# Patient Record
Sex: Male | Born: 1950 | Race: White | Hispanic: No | Marital: Single | Smoking: Former smoker
Health system: Southern US, Community
[De-identification: ages and names within clinical notes are randomized; demographics above are authoritative.]

## PROBLEM LIST (undated history)

## (undated) DIAGNOSIS — K219 Gastro-esophageal reflux disease without esophagitis: Secondary | ICD-10-CM

## (undated) DIAGNOSIS — I2699 Other pulmonary embolism without acute cor pulmonale: Secondary | ICD-10-CM

## (undated) DIAGNOSIS — I82409 Acute embolism and thrombosis of unspecified deep veins of unspecified lower extremity: Secondary | ICD-10-CM

## (undated) DIAGNOSIS — K432 Incisional hernia without obstruction or gangrene: Secondary | ICD-10-CM

## (undated) HISTORY — DX: Other pulmonary embolism without acute cor pulmonale: I26.99

## (undated) HISTORY — PX: HERNIA REPAIR: SHX51

## (undated) HISTORY — DX: Acute embolism and thrombosis of unspecified deep veins of unspecified lower extremity: I82.409

## (undated) HISTORY — PX: APPENDECTOMY: SHX54

## (undated) HISTORY — PX: TONSILLECTOMY: SUR1361

## (undated) HISTORY — DX: Gastro-esophageal reflux disease without esophagitis: K21.9

## (undated) HISTORY — DX: Incisional hernia without obstruction or gangrene: K43.2

---

## 2008-02-03 DIAGNOSIS — I2699 Other pulmonary embolism without acute cor pulmonale: Secondary | ICD-10-CM

## 2008-02-03 HISTORY — DX: Other pulmonary embolism without acute cor pulmonale: I26.99

## 2008-02-03 HISTORY — PX: FEMORAL ARTERY STENT: SHX1583

## 2008-02-03 HISTORY — PX: HERNIA REPAIR: SHX51

## 2008-02-03 HISTORY — PX: OTHER SURGICAL HISTORY: SHX169

## 2008-09-30 ENCOUNTER — Inpatient Hospital Stay: Payer: Self-pay | Admitting: Surgery

## 2008-11-02 ENCOUNTER — Inpatient Hospital Stay (HOSPITAL_COMMUNITY): Admission: EM | Admit: 2008-11-02 | Discharge: 2008-11-10 | Payer: Self-pay | Admitting: Emergency Medicine

## 2009-01-13 ENCOUNTER — Inpatient Hospital Stay (HOSPITAL_COMMUNITY): Admission: EM | Admit: 2009-01-13 | Discharge: 2009-01-15 | Payer: Self-pay | Admitting: Emergency Medicine

## 2010-05-06 LAB — CBC
RBC: 3.59 MIL/uL — ABNORMAL LOW (ref 4.22–5.81)
WBC: 7.4 10*3/uL (ref 4.0–10.5)

## 2010-05-06 LAB — COMPREHENSIVE METABOLIC PANEL
ALT: 18 U/L (ref 0–53)
Albumin: 3.3 g/dL — ABNORMAL LOW (ref 3.5–5.2)
Alkaline Phosphatase: 63 U/L (ref 39–117)
BUN: 15 mg/dL (ref 6–23)
CO2: 26 mEq/L (ref 19–32)
Calcium: 8.9 mg/dL (ref 8.4–10.5)
Creatinine, Ser: 0.83 mg/dL (ref 0.4–1.5)
Glucose, Bld: 131 mg/dL — ABNORMAL HIGH (ref 70–99)
Sodium: 137 mEq/L (ref 135–145)
Total Bilirubin: 0.6 mg/dL (ref 0.3–1.2)
Total Protein: 7.2 g/dL (ref 6.0–8.3)

## 2010-05-06 LAB — DIFFERENTIAL
Basophils Absolute: 0 10*3/uL (ref 0.0–0.1)
Basophils Relative: 0 % (ref 0–1)
Lymphocytes Relative: 24 % (ref 12–46)
Monocytes Relative: 12 % (ref 3–12)
Neutro Abs: 4.6 10*3/uL (ref 1.7–7.7)

## 2010-05-06 LAB — CULTURE, BLOOD (ROUTINE X 2): Culture: NO GROWTH

## 2010-05-06 LAB — PROTIME-INR
INR: 1.5 — ABNORMAL HIGH (ref 0.00–1.49)
INR: 1.96 — ABNORMAL HIGH (ref 0.00–1.49)
Prothrombin Time: 18 seconds — ABNORMAL HIGH (ref 11.6–15.2)
Prothrombin Time: 24.2 seconds — ABNORMAL HIGH (ref 11.6–15.2)

## 2010-05-06 LAB — APTT: aPTT: 68 seconds — ABNORMAL HIGH (ref 24–37)

## 2010-05-08 LAB — COMPREHENSIVE METABOLIC PANEL
Albumin: 2.5 g/dL — ABNORMAL LOW (ref 3.5–5.2)
Alkaline Phosphatase: 107 U/L (ref 39–117)
BUN: 19 mg/dL (ref 6–23)
CO2: 25 mEq/L (ref 19–32)
Chloride: 99 mEq/L (ref 96–112)
Creatinine, Ser: 0.81 mg/dL (ref 0.4–1.5)
GFR calc Af Amer: >60 mL/min (ref >60)
GFR calc non Af Amer: >60 mL/min (ref >60)
Glucose, Bld: 110 mg/dL — ABNORMAL HIGH (ref 70–99)
Potassium: 4.6 mEq/L (ref 3.5–5.1)
Total Protein: 7.6 g/dL (ref 6.0–8.3)

## 2010-05-08 LAB — BASIC METABOLIC PANEL
BUN: 13 mg/dL (ref 6–23)
Chloride: 101 mEq/L (ref 96–112)
Chloride: 104 mEq/L (ref 96–112)
Creatinine, Ser: 0.87 mg/dL (ref 0.4–1.5)
GFR calc Af Amer: >60 mL/min (ref >60)
GFR calc Af Amer: >60 mL/min (ref >60)
GFR calc Af Amer: >60 mL/min (ref >60)
GFR calc non Af Amer: >60 mL/min (ref >60)
GFR calc non Af Amer: >60 mL/min (ref >60)
GFR calc non Af Amer: >60 mL/min (ref >60)
Potassium: 3.6 mEq/L (ref 3.5–5.1)
Potassium: 3.9 mEq/L (ref 3.5–5.1)
Potassium: 3.9 mEq/L (ref 3.5–5.1)
Sodium: 136 mEq/L (ref 135–145)
Sodium: 139 mEq/L (ref 135–145)

## 2010-05-08 LAB — DIFFERENTIAL
Basophils Absolute: 0 10*3/uL (ref 0.0–0.1)
Basophils Relative: 0 % (ref 0–1)
Monocytes Absolute: 1.2 10*3/uL — ABNORMAL HIGH (ref 0.1–1.0)
Monocytes Relative: 10 % (ref 3–12)
Neutrophils Relative %: 82 % — ABNORMAL HIGH (ref 43–77)

## 2010-05-08 LAB — TYPE AND SCREEN
ABO/RH(D): B NEG
Antibody Screen: NEGATIVE

## 2010-05-08 LAB — CBC
HCT: 24.2 % — ABNORMAL LOW (ref 39.0–52.0)
HCT: 26 % — ABNORMAL LOW (ref 39.0–52.0)
HCT: 30.7 % — ABNORMAL LOW (ref 39.0–52.0)
Hemoglobin: 10.2 g/dL — ABNORMAL LOW (ref 13.0–17.0)
Hemoglobin: 8.8 g/dL — ABNORMAL LOW (ref 13.0–17.0)
MCHC: 33.1 g/dL (ref 30.0–36.0)
MCHC: 33.9 g/dL (ref 30.0–36.0)
MCV: 84.6 fL (ref 78.0–100.0)
MCV: 84.7 fL (ref 78.0–100.0)
MCV: 85.1 fL (ref 78.0–100.0)
MCV: 85.7 fL (ref 78.0–100.0)
Platelets: 335 10*3/uL (ref 150–400)
Platelets: 362 10*3/uL (ref 150–400)
Platelets: 423 10*3/uL — ABNORMAL HIGH (ref 150–400)
RBC: 2.86 MIL/uL — ABNORMAL LOW (ref 4.22–5.81)
RBC: 2.92 MIL/uL — ABNORMAL LOW (ref 4.22–5.81)
RBC: 2.96 MIL/uL — ABNORMAL LOW (ref 4.22–5.81)
RBC: 3.19 MIL/uL — ABNORMAL LOW (ref 4.22–5.81)
RBC: 3.58 MIL/uL — ABNORMAL LOW (ref 4.22–5.81)
RDW: 17.1 % — ABNORMAL HIGH (ref 11.5–15.5)
WBC: 10.3 10*3/uL (ref 4.0–10.5)
WBC: 8.1 10*3/uL (ref 4.0–10.5)
WBC: 8.3 10*3/uL (ref 4.0–10.5)
WBC: 8.3 10*3/uL (ref 4.0–10.5)
WBC: 9.3 10*3/uL (ref 4.0–10.5)
WBC: 9.4 10*3/uL (ref 4.0–10.5)

## 2010-05-08 LAB — HEPARIN LEVEL (UNFRACTIONATED)
Heparin Unfractionated: 0.37 IU/mL (ref 0.30–0.70)
Heparin Unfractionated: 0.71 IU/mL — ABNORMAL HIGH (ref 0.30–0.70)
Heparin Unfractionated: <0.1 IU/mL — ABNORMAL LOW (ref 0.30–0.70)
Heparin Unfractionated: <0.1 IU/mL — ABNORMAL LOW (ref 0.30–0.70)
Heparin Unfractionated: <0.1 IU/mL — ABNORMAL LOW (ref 0.30–0.70)

## 2010-05-08 LAB — PROTIME-INR
INR: 1.24 (ref 0.00–1.49)
INR: 1.4 (ref 0.00–1.49)
INR: 1.4 (ref 0.00–1.49)
INR: 2.6 — ABNORMAL HIGH (ref 0.00–1.49)
Prothrombin Time: 14.8 seconds (ref 11.6–15.2)
Prothrombin Time: 15.5 seconds — ABNORMAL HIGH (ref 11.6–15.2)
Prothrombin Time: 17 seconds — ABNORMAL HIGH (ref 11.6–15.2)
Prothrombin Time: 17.2 seconds — ABNORMAL HIGH (ref 11.6–15.2)

## 2010-05-08 LAB — CULTURE, BLOOD (ROUTINE X 2)
Culture: NO GROWTH
Culture: NO GROWTH

## 2010-05-08 LAB — PREPARE FRESH FROZEN PLASMA

## 2010-05-08 LAB — URINALYSIS, ROUTINE W REFLEX MICROSCOPIC
Bilirubin Urine: NEGATIVE
Glucose, UA: NEGATIVE mg/dL
Ketones, ur: NEGATIVE mg/dL
pH: 6 (ref 5.0–8.0)

## 2010-05-08 LAB — CULTURE, ROUTINE-ABSCESS

## 2010-05-08 LAB — ABO/RH: ABO/RH(D): B NEG

## 2010-08-20 ENCOUNTER — Ambulatory Visit (INDEPENDENT_AMBULATORY_CARE_PROVIDER_SITE_OTHER): Payer: Commercial Indemnity | Admitting: General Surgery

## 2010-08-20 ENCOUNTER — Encounter (INDEPENDENT_AMBULATORY_CARE_PROVIDER_SITE_OTHER): Payer: Self-pay | Admitting: General Surgery

## 2010-08-20 VITALS — BP 116/70 | HR 60 | Temp 97.9°F | Ht 70.0 in | Wt 172.4 lb

## 2010-08-20 DIAGNOSIS — K432 Incisional hernia without obstruction or gangrene: Secondary | ICD-10-CM

## 2010-08-20 DIAGNOSIS — Z8719 Personal history of other diseases of the digestive system: Secondary | ICD-10-CM | POA: Insufficient documentation

## 2010-08-20 DIAGNOSIS — Z9889 Other specified postprocedural states: Secondary | ICD-10-CM | POA: Insufficient documentation

## 2010-08-20 HISTORY — DX: Incisional hernia without obstruction or gangrene: K43.2

## 2010-08-20 NOTE — Progress Notes (Signed)
Subjective:     Patient ID: Jordan Staff., male   DOB: [birth year], 60 y.o.   MRN: [id number]  HPI Patient is well-known to our service status post admission to the inpatient surgical service for intra-abdominal abscess. Patient had undergone inguinal hernia repair at [facility] Followup lites look laparotomy for early postoperative bowel obstruction. He developed an intra-abdominal abscess and came to [facility], and our service for further care. He has done well and is feeling good. He has developed a incisional hernia. He has no change in bowel habits. He has no pain. The hernia seems to reduce spontaneously when he lays down. The bulge has been gradually getting bigger. 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  Review of Systems     Objective:   Physical Exam General patient is awake and alert and alert. Lungs: Clear to auscultation. No wheezing. Cardiovascular: Heart regular rate and rhythm and pulses palpable laterally in the left chest. Abdomen is soft there is a large incisional hernia from his previous midline incision. It reduces very easily with no pain. However the hernia defect is approximately 15 x 8 cm.     Assessment:     Incisional hernia    Plan:        This is a large incisional hernia. I don't feel it is amenable to laparoscopic repair. I feel he will need an open repair with mesh and likely some component separation. He will likely have some adhesions due to his previous abdominal abscess. We discussed surgery in detail. We also discussed risks and benefits as above. He would like to discuss things over with his family. He has agreed to back and see me in 6 weeks. Otherwise if he wants to go ahead with surgery he will call us in the interim. He wanted to hold his Coumadin for 5 days prior to surgery.

## 2010-08-28 ENCOUNTER — Telehealth (INDEPENDENT_AMBULATORY_CARE_PROVIDER_SITE_OTHER): Payer: Self-pay | Admitting: General Surgery

## 2010-08-28 NOTE — Telephone Encounter (Signed)
I will put the chart/orders on Dr Carollee Massed desk for completion

## 2010-09-24 ENCOUNTER — Ambulatory Visit (INDEPENDENT_AMBULATORY_CARE_PROVIDER_SITE_OTHER): Payer: Commercial Indemnity | Admitting: General Surgery

## 2010-09-24 ENCOUNTER — Encounter (INDEPENDENT_AMBULATORY_CARE_PROVIDER_SITE_OTHER): Payer: Self-pay | Admitting: General Surgery

## 2010-09-24 VITALS — BP 118/82 | HR 60 | Wt 172.2 lb

## 2010-09-24 DIAGNOSIS — K432 Incisional hernia without obstruction or gangrene: Secondary | ICD-10-CM

## 2010-09-24 NOTE — Progress Notes (Signed)
Subjective:     Patient ID: Jordan Staff., male   DOB: December 30, 1950, 60 y.o.   MRN: [id number]  HPI  Patient is for followup of incisional hernia. Patient had an abdominal abscess after exploratory laparotomy at [facility]. That was following a lifting hernia repair. He does lifting at work and is having some pain associated with this hernia. He is scheduled for open repair with component separation on September 20. He would like to see if we can move his surgery sooner. She is eating and moving his bowels regularly. The pain he has seems muscular and usually on the left side. Review of Systems     Objective:   Physical Exam  HENT:  Head: Normocephalic and atraumatic.  Cardiovascular: Normal rate and regular rhythm.   Pulmonary/Chest: Effort normal and breath sounds normal. He has no wheezes.  Abdominal: Soft. Bowel sounds are normal. There is no tenderness. There is no guarding.  Patient has a large incisional hernia. This easily reduces. The defect is stable in size from previous visit. There is no pain on examination or reduction.     Assessment:     Incisional hernia    Plan:     Offered open repair with likely component separation and mesh. Procedure risks and benefits were again discussed with the patient. These include but are not limited to bleeding infection bowel injury other organ injury pulmonary and cardiac complications. This will require a hospitalization of several days. He'll need to hold his Coumadin starting 5 days prior to surgery. Patient is agreeable. We will see if we can with the patient surgery sooner.

## 2010-10-15 ENCOUNTER — Encounter (HOSPITAL_COMMUNITY)
Admission: RE | Admit: 2010-10-15 | Discharge: 2010-10-15 | Disposition: A | Discharge status: Home / Self Care | Payer: Managed Care, Other (non HMO) | Source: Ambulatory Visit | Attending: General Surgery | Admitting: General Surgery

## 2010-10-15 LAB — CBC
HCT: 40.6 % (ref 39.0–52.0)
MCH: 30.5 pg (ref 26.0–34.0)
MCHC: 34 g/dL (ref 30.0–36.0)
MCV: 89.6 fL (ref 78.0–100.0)
RDW: 13.4 % (ref 11.5–15.5)

## 2010-10-15 LAB — SURGICAL PCR SCREEN: Staphylococcus aureus: POSITIVE — AB

## 2010-10-23 ENCOUNTER — Inpatient Hospital Stay (HOSPITAL_COMMUNITY)
Admission: RE | Admit: 2010-10-23 | Discharge: 2010-10-28 | DRG: 355 | Disposition: A | Discharge status: Home / Self Care | Payer: Managed Care, Other (non HMO) | Source: Ambulatory Visit | Attending: General Surgery | Admitting: General Surgery

## 2010-10-23 DIAGNOSIS — K432 Incisional hernia without obstruction or gangrene: Principal | ICD-10-CM | POA: Diagnosis present

## 2010-10-23 DIAGNOSIS — Z01812 Encounter for preprocedural laboratory examination: Secondary | ICD-10-CM

## 2010-10-23 DIAGNOSIS — R339 Retention of urine, unspecified: Secondary | ICD-10-CM | POA: Diagnosis not present

## 2010-10-23 DIAGNOSIS — Z86711 Personal history of pulmonary embolism: Secondary | ICD-10-CM

## 2010-10-23 DIAGNOSIS — Z23 Encounter for immunization: Secondary | ICD-10-CM

## 2010-10-23 HISTORY — PX: HERNIA REPAIR: SHX51

## 2010-10-24 LAB — BASIC METABOLIC PANEL
BUN: 16 mg/dL (ref 6–23)
CO2: 28 mEq/L (ref 19–32)
Chloride: 100 mEq/L (ref 96–112)
Creatinine, Ser: 0.98 mg/dL (ref 0.50–1.35)
Potassium: 4.2 mEq/L (ref 3.5–5.1)

## 2010-10-24 LAB — CBC
HCT: 35.4 % — ABNORMAL LOW (ref 39.0–52.0)
MCV: 90.5 fL (ref 78.0–100.0)
RBC: 3.91 MIL/uL — ABNORMAL LOW (ref 4.22–5.81)
WBC: 12 10*3/uL — ABNORMAL HIGH (ref 4.0–10.5)

## 2010-10-30 NOTE — Op Note (Signed)
NAMECYRUS, Jordan Long NO.:  [id number]  MEDICAL RECORD NO.:  [id number]  LOCATION:  2899                         FACILITY:  [facility]  PHYSICIAN:  Gabrielle Dare. Janee Morn, M.D.DATE OF BIRTH:  05-05-1950  DATE OF PROCEDURE:  10/23/2010 DATE OF DISCHARGE:                              OPERATIVE REPORT   PREOPERATIVE DIAGNOSIS:  Incisional hernia.  POSTOPERATIVE DIAGNOSIS:  Incisional hernia.  PROCEDURE:  Repair of incisional hernia with component separation and mesh, 25 x 15-cm Physiomesh.  SURGEON:  Gabrielle Dare. Janee Morn, MD  ANESTHESIA:  General endotracheal.  HISTORY OF PRESENT ILLNESS:  Jordan Long is a 60 year old gentleman who initially had left inguinal hernia repair done at [facility], this was followed by an exploratory laparotomy for bowel obstruction.  He developed intra-abdominal abscesses and after discharge from [facility], was admitted to [facility].  We care for him on our Acute Care Surgery Service for these abscesses.  He improved from all that, but has developed an incisional hernia, who presents today for elective repair of that hernia.  PROCEDURE IN DETAIL:  Informed consent was obtained.  The patient was identified in the preop holding area.  He received intravenous antibiotics.  He was brought to the operating room.  General endotracheal anesthesia was administered by the Anesthesia staff.  A time-out procedure was performed.  A Foley catheter was placed by nursing staff.  Abdomen was prepped and draped in sterile fashion.  A midline incision was made in an elliptical fashion to excise his old scar.  This was excised completely.  The hernia sac was entered and the hernia sac was opened at all along the fascia.  The hernia sac was freed up off the bowel and the fascia excised and discarded.  Further adhesiolysis was done to clear the small bowel circumferentially away from the fascia.  The superior part of the incision was noted to have  a couple more holes and the fascia extending up superiorly, so the skin incision was extended and the fascia was opened until we were beyond the porous portion of it back to good fascia.  Next, it was determined that the fascia would not come together easily, so as expected, we proceed with component separation, flaps were raised on each side just over the fascia and once we got out laterally, anterior fascia was incised in order to facilitate immobilization, this was done on both sides. Hemostasis was obtained in the subcutaneous flap with Bovie cautery. Once we freed up the fascia on both sides, then almost came together in the midline, the defect was measured out and chose a 15 x 20-cm Physiomesh in order to give some centimeters of underlay.  The bowel was rechecked and it was fully freed from the fascia for our repair and there were no complications from the adhesiolysis.  Next, the Physiomesh was tacked in with interrupted #1 Novafil sutures circumferentially. This nearly brought the fascia together in the midline.  Once all these were tied down securely and was good hemostasis, additional figure-of- eight #1 Novafil was used to close the fascia over the top of the mesh. The skin together very nicely.  Area was copiously irrigated. Meticulous hemostasis was ensured.  Two 19-French Harrison Mons  drains were placed, one on each side for the subcutaneous flap.  The umbilicus was tacked, but backed down to the midline fascia with interrupted 2-0 Vicryl sutures and the skin was closed with staples.  Sponge, needle and instrument counts were all correct.  The patient tolerated the procedure well without apparent complication, was taken to the recovery room in stable condition.     Gabrielle Dare Janee Morn, M.D.     BET/MEDQ  D:  10/23/2010  T:  10/23/2010  Job:  [id number]  Electronically Signed by Violeta Gelinas M.D. on 10/30/2010 01:07:01 PM

## 2010-10-30 NOTE — Discharge Summary (Signed)
  NAMEMYCAL, CONDE NO.:  [id number]  MEDICAL RECORD NO.:  [id number]  LOCATION:  5158                         FACILITY:  [facility]  PHYSICIAN:  Gabrielle Dare. Janee Morn, M.D.DATE OF BIRTH:  11-05-1950  DATE OF ADMISSION:  10/23/2010 DATE OF DISCHARGE:  10/28/2010                              DISCHARGE SUMMARY   DISCHARGE DIAGNOSIS:  Status post incisional hernia repair with mesh and components separation.  HISTORY OF PRESENT ILLNESS:  Mr. Sevey is a 60 year old gentleman, who presented for elective incisional hernia repair.  He initially had an exploratory laparotomy after inguinal hernia surgery at an outside hospital for bowel obstruction.  He developed a hernia along his midline and presents for elective repair.  HOSPITAL COURSE:  The patient underwent an uncomplicated open incisional hernia repair with mesh and components separation.  Postoperatively, he had good pain control initially with PCA and transitioned to oral pain medication and muscle relaxers.  He remained afebrile and hemodynamically stable.  He had 2 drains in the subcutaneous plane and the output for these tapered down pretty quickly.  These were removed the day prior to discharge.  He mobilized very well walking around and had excellent pain control, tolerated advancement of his diet and is discharged home in stable condition on postoperative day 5.  DISCHARGE DIET:  Regular.  DISCHARGE ACTIVITY:  No lifting.  DISCHARGE MEDICATIONS: 1. Oxycodone/APAP 5/325 one to two every 4 hours as needed for pain. 2. He is also to take Zanaflex 4 mg p.o. q.8 h. p.r.n. muscle spasm.  He is to continue all of his home medications include: 1. Ferrous sulfate 325 mg daily. 2. Multivitamin 1 daily. 3. Tylenol over the counter. 4. Coumadin 9 mg every morning.  FOLLOWUP:  In 2 weeks with myself.     Gabrielle Dare Janee Morn, M.D.    BET/MEDQ  D:  10/28/2010  T:  10/28/2010  Job:   [id number]  Electronically Signed by Violeta Gelinas M.D. on 10/30/2010 01:07:04 PM

## 2010-11-02 ENCOUNTER — Encounter: Payer: Self-pay | Admitting: Internal Medicine

## 2010-11-02 ENCOUNTER — Inpatient Hospital Stay (HOSPITAL_COMMUNITY)
Admission: EM | Admit: 2010-11-02 | Discharge: 2010-11-06 | DRG: 301 | Disposition: A | Discharge status: Home / Self Care | Payer: Managed Care, Other (non HMO) | Attending: Internal Medicine | Admitting: Internal Medicine

## 2010-11-02 DIAGNOSIS — Z7901 Long term (current) use of anticoagulants: Secondary | ICD-10-CM

## 2010-11-02 DIAGNOSIS — I824Z9 Acute embolism and thrombosis of unspecified deep veins of unspecified distal lower extremity: Principal | ICD-10-CM | POA: Diagnosis present

## 2010-11-02 DIAGNOSIS — K5900 Constipation, unspecified: Secondary | ICD-10-CM | POA: Diagnosis present

## 2010-11-02 DIAGNOSIS — I82409 Acute embolism and thrombosis of unspecified deep veins of unspecified lower extremity: Secondary | ICD-10-CM

## 2010-11-02 DIAGNOSIS — M7989 Other specified soft tissue disorders: Secondary | ICD-10-CM

## 2010-11-02 DIAGNOSIS — Z86718 Personal history of other venous thrombosis and embolism: Secondary | ICD-10-CM

## 2010-11-02 LAB — DIFFERENTIAL
Eosinophils Absolute: 0.1 10*3/uL (ref 0.0–0.7)
Eosinophils Relative: 1 % (ref 0–5)
Lymphocytes Relative: 10 % — ABNORMAL LOW (ref 12–46)
Lymphs Abs: 1.1 10*3/uL (ref 0.7–4.0)
Monocytes Relative: 8 % (ref 3–12)
Neutrophils Relative %: 80 % — ABNORMAL HIGH (ref 43–77)

## 2010-11-02 LAB — POCT I-STAT, CHEM 8
BUN: 18 mg/dL (ref 6–23)
Calcium, Ion: 1.17 mmol/L (ref 1.12–1.32)
Chloride: 100 mEq/L (ref 96–112)
Creatinine, Ser: 1.2 mg/dL (ref 0.50–1.35)
Glucose, Bld: 133 mg/dL — ABNORMAL HIGH (ref 70–99)
TCO2: 26 mmol/L (ref 0–100)

## 2010-11-02 LAB — CBC
HCT: 39 % (ref 39.0–52.0)
MCH: 30 pg (ref 26.0–34.0)
MCV: 89.2 fL (ref 78.0–100.0)
Platelets: 177 10*3/uL (ref 150–400)
RBC: 4.37 MIL/uL (ref 4.22–5.81)
WBC: 11.1 10*3/uL — ABNORMAL HIGH (ref 4.0–10.5)

## 2010-11-02 LAB — PROTIME-INR: Prothrombin Time: 17.1 seconds — ABNORMAL HIGH (ref 11.6–15.2)

## 2010-11-02 NOTE — H&P (Signed)
Hospital Admission Note Date: 11/02/2010  Patient name:  Jordan Long  Medical record number:  [id number] Date of birth:  Jul 28, 1950    Age: 60 y.o.  Gender:  male PCP:    No primary provider on file.  Medical Service:   Internal Medicine Teaching Service   Attending physician:  Dr. Cliffton Asters First Contact:   Imelda Pillow (MS-IV)  Pager: [phone]  Second Contact:   Dr. Saralyn Pilar Pager: [phone] After Hours:   First Contact   Pager: [phone]      Second Contact   Pager: [phone]   Chief Complaint: Right calf pain  History of Present Illness: Patient is a 60 y.o. male with a PMHx of chronic coumadin therapy secondary to postoperative deep venous thrombosis and pulmonary embolism in 10/2008 and recent elective abdominal hernia repair in 10/2010 with uncomplicated hospital course who presents to [facility] for evaluation of right lower extremity pain. The patient notes that he was of his usual state of health until the morning of admission, when he awoke with sharp, constant, 8-10/10 pain in the right calf extending from above the knee to just above the ankle. He also described associated minimal tingling and numbness in the leg. The patient, however, denied associated shortness of breath, discomfort with inspiration/expiration, chest pain, nausea/vomiting, or cough.    In regards to his recent surgery, it seems that the patient was held of his coumadin peri-surgically. It is in, although it is unclear if he was bridged with heparin around this time. Mr. Ramnauth was resumed of his coumadin on discharge from the hospital on 10/28/2010. Mr. Mamone's chronic coumadin therapy is monitored by Dr. Donnie Aho, however, he has not had coumadin monitored after hospital discharge. Currently, the patient denies additional symptoms (see below for full ROS). He further denies changes in diet, medication, or recent antibiotic use.     Current Outpatient Medications: Warfarin 6mg  QD Percocet 5-3235mg   1-2 tabs q4 prn Tizandidine 4mg  po q8h prn Multivitamin 1 tab daily Ferrous Sulfate 325mg  QD  Allergies: No Known Allergies   Past Medical History: Right calf DVT (10/2008 [facility]) - provoked, post-surgical - status-post IVC filter placement  BL PE post-op right and left hernia repairs (10/2008 [facility])  Past Surgical History: Elective abdominal hernia repair (10/23/10) Ex-lap for SBO following incarcerated hernia (2010) Right inguinal hernia repair 2010 Left inguinal hernia repair 2010  Family History: Father - lymphoma; mother - parkinson's disease, CHF, PE in setting of CHF, DMII; niece - clotting disorder NOS  Social History: Remote history of smoking (in his 20's, 1 pack/week, less than 10 years), one beer/week, no drugs. Lives alone in apartment in [place].   Recovering from operation with his sister in [place].  Works at [employer].    Review of Systems: Constitutional:  Denies fever, diaphoresis.  HEENT: Denies changes in vision, changes in hearing, sore throat  Respiratory: Denies SOB, cough, chest tightness  Cardiovascular: Denies chest pain/pressure  Gastrointestinal: Endorses constipation; denies nausea, vomiting, abdominal pain, diarrhea, blood in stool and abdominal distention.  Genitourinary: Denies dysuria, urgency, frequency  Musculoskeletal: Denies myalgias, back pain  Skin: Denies pallor, rash and wound.  Neurological: Denies dizziness,weakness, headaches  Hematological: Denies personal or family bleeding history. Confirms clotting disorder in niece and mother with history of PE.    Vital Signs: T: 98.3 P: 53 BP: 108/56 RR: 15 O2 sat:  95% on RA   Physical Exam: General: Vital signs reviewed and noted. Well-developed, well-nourished, in  no acute distress; alert, appropriate and cooperative throughout examination.  Head: Normocephalic, atraumatic.  Eyes: PERR, EOMI, No signs of anemia or jaundince.    Throat: Oropharynx nonerythematous, no exudate appreciated.   Neck: No deformities, masses, or tenderness noted.Supple, No carotid Bruits, no JVD.  Lungs:  Normal respiratory effort. Clear to auscultation BL without crackles or wheezes.  Heart: RRR. S1 and S2 normal without gallop, murmur, or rubs.  Abdomen:  Mid-line incision with sutures; wound is clean dry and intact. BS normoactive. Soft, Nondistended, non-tender.  No masses or organomegaly.  Extremities: Right lower extremity - warm to touch extending from dorsum of foot to mid-thigh. (+) Homan sign, sensation intact to light touch. Mild fullness of right lower extremity as compared to left. 2+ DP pulses bilaterally.  Neurologic: CN II - XII are grossly intact. Sensations intact to light touch  Skin: No visible rashes, scars.      Lab results: Basic Metabolic Panel: Recent Labs  Basename 11/02/10 1002   NA 135   K 4.1   CL 100   CO2 --   GLUCOSE 133*   BUN 18   CREATININE 1.20   CALCIUM --   MG --   PHOS --   CBC: Recent Labs  Basename 11/02/10 1002 11/02/10 0935   WBC -- 11.1*   NEUTROABS -- 8.8*   HGB 14.3 13.1   HCT 42.0 39.0   MCV -- 89.2   PLT -- 177    Imaging results:  None.   Other results: EKG: No EKG available at time of admission.   Assessment & Plan: Mr. Meyer is a 60 y/o male with recent elective abdominal hernia repair who presents to [facility] with right lower extremity pain and LE Doppler ultrasound confirming DVT in the right lower extremity.  1) Deep venous thrombosis in right lower extremity - Preliminary Doppler US of Right lower extremity indicates occlusive DVT throughout right lower extremity from posterior tibial vein, to popliteal, femoral, and common femoral veins. The patient has a history of Rt calf DVT (s/p IVC filter) and BL PE in 2010 also occuring postsurgically. Surrounding his recent elective surgery, Mr. Caples was held of his coumadin therapy, and then resumed on discharge on  09/25. It seems that coumadin levels have not been checked since discharge, and it is unclear if heparin bridging was initiated. The patient's acute DVT is likely provoked given his recent surgical procedure, subtherapeutic coumadin levels indicating incomplete anticoagulation, and likely decreased activity levels from usual baseline. Both episodes of VTE seem to have occurred surrounding surgical procedures, however, patient does notably have family history of coagulopathy, and per Echart review, he has not had prior hypercoaguable evaluation. No symptoms of PE at this time, specifically, no oxygen desaturation, dyspnea, chest pain.  - Started full-dose lovenox per pharmacy at 1.5mg /kg/day - Will start warfarin per pharmacy. - AM CBC. - EKG pending. - Can consider hypercoaguable workup considering recurrence of VTE and family history.  2) Status-post abdominal hernia repair 09/20 - Surgical site seems to be healing well, without evidence of cellulitis of surrounding skin, skin breakdown, dehiscence of surgical site, or other infections. Patient does indicate persistent post-surgical pain that is well-controlled on Percocet. - Continue PRN Percocet. - Will inform Dr. Janee Morn of patient's hospitalization.  3) Constipation - patient describes ongoing constipation, likely worsened with use of opiates and iron supplementation.  - Start colace 100mg  po BID prn.  4) FEN/GI - Normal diet  5) Dispo - Given multiple prior DVTs/PE  and the extent of his present DVT, he will need to be anticoagulated and bridged as an inpatient.  - Admit to telemetry floor    Imelda Pillow (MS-IV):    ____________________________________    Date/ Time:      ____________________________________     Johnette Abraham, D.O. (PGY2):  ____________________________________    Date/ Time:      ____________________________________      I have seen and examined the patient. I reviewed the resident/fellow note and  agree with the findings and plan of care as documented. My additions and revisions are included.   Signature:  ____________________________________________     Internal Medicine Teaching Service Attending    Date:   ____________________________________________

## 2010-11-03 DIAGNOSIS — I824Z9 Acute embolism and thrombosis of unspecified deep veins of unspecified distal lower extremity: Secondary | ICD-10-CM

## 2010-11-03 LAB — CBC
HCT: 36.5 % — ABNORMAL LOW (ref 39.0–52.0)
Hemoglobin: 12.2 g/dL — ABNORMAL LOW (ref 13.0–17.0)
MCH: 30.4 pg (ref 26.0–34.0)
MCV: 91 fL (ref 78.0–100.0)
Platelets: 194 10*3/uL (ref 150–400)
RBC: 4.01 MIL/uL — ABNORMAL LOW (ref 4.22–5.81)
WBC: 8.4 10*3/uL (ref 4.0–10.5)

## 2010-11-04 LAB — PROTIME-INR
INR: 1.74 — ABNORMAL HIGH (ref 0.00–1.49)
Prothrombin Time: 20.7 seconds — ABNORMAL HIGH (ref 11.6–15.2)

## 2010-11-05 LAB — PROTIME-INR
INR: 2.04 — ABNORMAL HIGH (ref 0.00–1.49)
Prothrombin Time: 23.4 seconds — ABNORMAL HIGH (ref 11.6–15.2)

## 2010-11-06 DIAGNOSIS — I824Z9 Acute embolism and thrombosis of unspecified deep veins of unspecified distal lower extremity: Secondary | ICD-10-CM

## 2010-11-06 LAB — CBC
MCV: 89.7 fL (ref 78.0–100.0)
Platelets: 308 10*3/uL (ref 150–400)
RBC: 4.19 MIL/uL — ABNORMAL LOW (ref 4.22–5.81)
RDW: 13 % (ref 11.5–15.5)
WBC: 8.9 10*3/uL (ref 4.0–10.5)

## 2010-11-06 LAB — PROTIME-INR
INR: 2.45 — ABNORMAL HIGH (ref 0.00–1.49)
Prothrombin Time: 27 seconds — ABNORMAL HIGH (ref 11.6–15.2)

## 2010-11-12 ENCOUNTER — Ambulatory Visit (INDEPENDENT_AMBULATORY_CARE_PROVIDER_SITE_OTHER): Payer: Managed Care, Other (non HMO) | Admitting: General Surgery

## 2010-11-12 ENCOUNTER — Encounter (INDEPENDENT_AMBULATORY_CARE_PROVIDER_SITE_OTHER): Payer: Self-pay | Admitting: General Surgery

## 2010-11-12 VITALS — BP 140/82 | HR 62 | Temp 97.9°F | Resp 16 | Ht 70.0 in | Wt 170.5 lb

## 2010-11-12 DIAGNOSIS — K432 Incisional hernia without obstruction or gangrene: Secondary | ICD-10-CM

## 2010-11-12 DIAGNOSIS — I82409 Acute embolism and thrombosis of unspecified deep veins of unspecified lower extremity: Secondary | ICD-10-CM

## 2010-11-12 MED ORDER — OXYCODONE-ACETAMINOPHEN 5-325 MG PO TABS: 1.0000 | ORAL_TABLET | Freq: Four times a day (QID) | ORAL | Status: DC | PRN

## 2010-11-12 NOTE — Progress Notes (Signed)
Addended by: Latricia Heft on: 11/12/2010 09:37 AM   Modules accepted: Orders

## 2010-11-12 NOTE — Patient Instructions (Signed)
Wear binder for comfort

## 2010-11-12 NOTE — Progress Notes (Signed)
Subjective:     Patient ID: Jordan Long, male   DOB: 12-14-1950, 60 y.o.   MRN: [id number]  HPI Patient status post component separation incisional hernia repair with mesh. Patient is doing well postoperatively with the exception of a recent admission for right calf DVT. He was coumadinized and his Coumadin is being followed by Dr. Viann Fish. The patient reports he is therapeutic. His leg swelling is gone down. His appetite has not completely returned. He is still requiring some pain medication.  Review of Systems     Objective:   Physical Exam  Constitutional: He appears well-developed.  HENT:  Head: Normocephalic and atraumatic.  Pulmonary/Chest: Effort normal and breath sounds normal. He has no wheezes.  Abdominal: Soft. He exhibits no distension and no mass.   His hernia repair is intact. His midline incision is intact with Steri-Strips. There is no evidence of infection Right calf edema is significantly improved.    Assessment:     Doing very well status post incisional hernia repair with mesh and component separation. Patient had DVT after discharge home.   Plan:     Avoid heavy lifting for a total of 8 weeks after surgery. Continue to wear abdominal binder for comfort. Continue Coumadin for Dr. Tresa Endo. I will see him back in 3-4 weeks before he returns to work. I will give him a prescription for an additional abdominal binder.

## 2010-11-20 ENCOUNTER — Encounter: Payer: Self-pay | Admitting: Internal Medicine

## 2010-11-20 ENCOUNTER — Ambulatory Visit (INDEPENDENT_AMBULATORY_CARE_PROVIDER_SITE_OTHER): Payer: Managed Care, Other (non HMO) | Admitting: Internal Medicine

## 2010-11-20 DIAGNOSIS — I82409 Acute embolism and thrombosis of unspecified deep veins of unspecified lower extremity: Secondary | ICD-10-CM

## 2010-11-20 DIAGNOSIS — K5900 Constipation, unspecified: Secondary | ICD-10-CM | POA: Insufficient documentation

## 2010-11-20 DIAGNOSIS — K432 Incisional hernia without obstruction or gangrene: Secondary | ICD-10-CM

## 2010-11-20 NOTE — Progress Notes (Deleted)
  Subjective:   Patient ID: Jordan Long male   DOB: 1950/02/20 60 y.o.   MRN: [id number]  HPI: Jordan Long is a 60 y.o.     No past medical history on file. Current Outpatient Prescriptions  Medication Sig Dispense Refill  . oxyCODONE-acetaminophen (ROXICET) 5-325 MG per tablet Take 1 tablet by mouth every 6 (six) hours as needed for pain.  40 tablet  0  . warfarin (COUMADIN) 6 MG tablet Take 6 mg by mouth daily. Patient takes 1 1/2 tablet        Family History  Problem Relation Age of Onset  . Cancer Father    History   Social History  . Marital Status: Divorced    Spouse Name: N/A    Number of Children: N/A  . Years of Education: N/A   Social History Main Topics  . Smoking status: Former Smoker    Quit date: 09/23/1985  . Smokeless tobacco: None  . Alcohol Use: No  . Drug Use: No  . Sexually Active: None   Other Topics Concern  . None   Social History Narrative  . None   Review of Systems: Constitutional: Denies fever, chills, diaphoresis, appetite change and fatigue.  HEENT: Denies photophobia, eye pain, redness, hearing loss, ear pain, congestion, sore throat, rhinorrhea, sneezing, mouth sores, trouble swallowing, neck pain, neck stiffness and tinnitus.   Respiratory: Denies SOB, DOE, cough, chest tightness,  and wheezing.   Cardiovascular: Denies chest pain, palpitations and leg swelling.  Gastrointestinal: Denies nausea, vomiting, abdominal pain, diarrhea, constipation, blood in stool and abdominal distention.  Genitourinary: Denies dysuria, urgency, frequency, hematuria, flank pain and difficulty urinating.  Musculoskeletal: Denies myalgias, back pain, joint swelling, arthralgias and gait problem.  Skin: Denies pallor, rash and wound.  Neurological: Denies dizziness, seizures, syncope, weakness, light-headedness, numbness and headaches.  Hematological: Denies adenopathy. Easy bruising, personal or family bleeding history    Psychiatric/Behavioral: Denies suicidal ideation, mood changes, confusion, nervousness, sleep disturbance and agitation  Objective:  Physical Exam: Filed Vitals:   11/20/10 0938  BP: 119/71  Pulse: 111  Temp: 97.2 F (36.2 C)  TempSrc: Oral  Height: 5\' 10"  (1.778 m)  Weight: 171 lb 6.4 oz (77.747 kg)   Constitutional: Vital signs reviewed.  Patient is a well-developed and well-nourished *** in no acute distress and cooperative with exam. Alert and oriented x3.  Head: Normocephalic and atraumatic Ear: TM normal bilaterally Mouth: no erythema or exudates, MMM Eyes: PERRL, EOMI, conjunctivae normal, No scleral icterus.  Neck: Supple, Trachea midline normal ROM, No JVD, mass, thyromegaly, or carotid bruit present.  Cardiovascular: RRR, S1 normal, S2 normal, no MRG, pulses symmetric and intact bilaterally Pulmonary/Chest: CTAB, no wheezes, rales, or rhonchi Abdominal: Soft. Non-tender, non-distended, bowel sounds are normal, no masses, organomegaly, or guarding present.  GU: no CVA tenderness Musculoskeletal: No joint deformities, erythema, or stiffness, ROM full and no nontender Hematology: no cervical, inginal, or axillary adenopathy.  Neurological: A&O x3, Strenght is normal and symmetric bilaterally, cranial nerve II-XII are grossly intact, no focal motor deficit, sensory intact to light touch bilaterally.  Skin: Warm, dry and intact. No rash, cyanosis, or clubbing.  Psychiatric: Normal mood and affect. speech and behavior is normal. Judgment and thought content normal. Cognition and memory are normal.   Assessment & Plan:

## 2010-11-24 ENCOUNTER — Encounter: Payer: Self-pay | Admitting: Internal Medicine

## 2010-11-24 NOTE — Progress Notes (Signed)
  Subjective:   Patient ID: Jordan Long male   DOB: August 26, 1950 60 y.o.   MRN: [id number]  HPI: Jordan Long is a 60 y.o. male with PMH significant as outlined below who presented to the clinic for a hospital follow up. This is his first visit to this clinic. Patient was admitted for DVT after hernia repair. 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 The first episode occurred after hernia surgery. Patient then underwent again a  Hernia repair on 10/23/2010 . Prior to the surgery the coumadin was d/c 5 days patient then presented on 10/1 with leg pain and was found to have DVT. Patient noted that he has been doing fine. No concern today except some discomfort in the leg and on the abdomen after the surgery which is relieved by pain medication.      Past Medical History  Diagnosis Date  . DVT (deep venous thrombosis) 2010 and 10/2010    after Hernia repair in 2010 s/p IVC filter and coumadin. Next episode after Hernia repair  ( 10/23/2010) . Admitted on 11/02/2010 for repeat DVT. Warfarin was stopped for  5 days before surgery.   . Incisional hernia 08/20/2010  . Constipation 11/20/2010   Current Outpatient Prescriptions  Medication Sig Dispense Refill  . acetaminophen (TYLENOL) 500 MG tablet Take 500 mg by mouth every 6 (six) hours as needed.        . docusate sodium (COLACE) 100 MG capsule Take 100 mg by mouth 2 (two) times daily as needed. For constipation       . oxyCODONE-acetaminophen (ROXICET) 5-325 MG per tablet Take 1 tablet by mouth every 6 (six) hours as needed for pain.  40 tablet  0  . warfarin (COUMADIN) 6 MG tablet Take 6 mg by mouth daily. Patient takes 1 1/2 tablet        Review of Systems: Constitutional: Denies fever, chills, diaphoresis, appetite change and fatigue.    Respiratory: Denies SOB, DOE, cough, chest tightness,  and wheezing.   Cardiovascular: Denies chest pain, palpitations and leg swelling.    Gastrointestinal: Denies nausea, vomiting, abdominal pain, diarrhea, constipation, blood in stool and abdominal distention.  Genitourinary: Denies dysuria, urgency, frequency, hematuria, flank pain and difficulty urinating.   Neurological: Denies dizziness, seizures, syncope, weakness, light-headedness, numbness and headaches.    Objective:  Physical Exam: Filed Vitals:   11/20/10 0938  BP: 119/71  Pulse: 111  Temp: 97.2 F (36.2 C)  TempSrc: Oral  Height: 5\' 10"  (1.778 m)  Weight: 171 lb 6.4 oz (77.747 kg)   Constitutional: Vital signs reviewed.  Patient is a well-developed and well-nourished in no acute distress and cooperative with exam. Alert and oriented x3.   Neck: Supple, Trachea midline normal ROM,  Cardiovascular: RRR, S1 normal, S2 normal, no MRG, pulses symmetric and intact bilaterally Pulmonary/Chest: CTAB, no wheezes, rales, or rhonchi Abdominal: Soft. Non-tender, non-distended, bowel sounds are normal, no masses, organomegaly, or guarding present.  GU: no CVA tenderness Musculoskeletal: Right leg significant non-pitting edema notable with tenderness with palpation. No joint deformities, erythema, or stiffness, ROM full  Neurological: A&O x3, no focal motor deficit, sensory intact to light touch bilaterally.  Skin: Warm, dry and intact. No rash, cyanosis, or clubbing.

## 2010-11-24 NOTE — Progress Notes (Deleted)
  Subjective:    Patient ID: Jordan Long, male    DOB: 07/08/1950, 60 y.o.   MRN: [id number]  HPI    Review of Systems     Objective:   Physical Exam        Assessment & Plan:

## 2010-11-24 NOTE — Assessment & Plan Note (Signed)
INR is followed by Dr Viann Fish ( his Cardiologist). Discussion was initiated during hospital admission if perioperative bridging with heparin is needed. Per primary team during hospital admission noted that current ACC guidelines do not provide specific guidance towards  patients with a history of DVT with IVC filter placement  surrounding elective surgery.  Furthermore, there have been no randomized clinical trials to support a specific intervention perioperatively, and therefore it is often thought that only the patient is high at thrombotic risk such as CHADS2 score of greater than 4, it should be bridged with heparin therapy perioperatively. Dr. Donnie Aho, he recommends that in the future given that the patient now had multiple postoperative DVTs  that he should be bridge for any subsequent surgeries that may be required.

## 2010-11-24 NOTE — Assessment & Plan Note (Signed)
Will follow up with Dr. Violeta Gelinas on December 03, 2010 .

## 2010-12-01 NOTE — Discharge Summary (Signed)
Jordan Long, Jordan Long NO.:  [id number]  MEDICAL RECORD NO.:  [id number]  LOCATION:  5012                         FACILITY:  [facility]  PHYSICIAN:  Cliffton Asters, M.D.    DATE OF BIRTH:  04-Nov-1950  DATE OF ADMISSION:  11/02/2010 DATE OF DISCHARGE:  11/06/2010                              DISCHARGE SUMMARY   DISCHARGE DIAGNOSES: 1. Deep vein thrombosis in the right lower extremity - Doppler     ultrasound confirming acute thrombus extending from the right     posterior tibial to the right common femoral arteries.  Notably,     this deep venous thrombosis occurred in the postoperative setting.     The patient had a history of right IVC filter placement. 2. Status post abdominal hernia repair, October 23, 2010 - surgical     sites are well healing. 3. Constipation - likely secondary to his use of opiates and iron     supplementation.  DISCHARGE MEDICATIONS: 1. Colace 100 mg by mouth twice daily, use only as needed if he     develops loose stools. 2. Lovenox 115 mg subcutaneously q.24 hours to be taken on November 06, 2010 and then last dose on November 07, 2010. 3. MiraLAX 17 g daily as needed. 4. Warfarin 6 mg tablets take 1.5 mg until November 10, 2010, and then     as advised by Dr. Donnie Aho. 5. Multivitamin by mouth once daily. 6. Percocet 5-325 one to two tablets every 4 hours as needed for pain. 7. Zanaflex 4 mg tabs 1 tablet by mouth every 8 hours as needed for     pain. 8. Tylenol 325 mg every 6 hours as needed.  Not to be taken     concurrently with Percocet.  DISPOSITION AND FOLLOW UP:  The patient will follow up with Dr. Viann Fish, his cardiologist, on November 10, 2010 at 10:00 am at which time an INR should be checked, with further adjustments to Coumadin therapy as indicated at that time.  The patient will also follow up with the [facility] on November 20, 2010, at 9:15 am, at which time the patient is  recommended to establish care, with considerations for beginning regular screening examinations as indicated for his age.  The patient will also follow up with Dr. Violeta Gelinas on December 03, 2010, at which time, follow up of his abdominal hernia repair will be provided.  Prior to discharge, the patient was counseled extensively on how to administer home Lovenox therapy, he understands that he expresses understanding that if he has any trouble with the administering or obtaining the Lovenox that he is to call the [facility] for further assistance.  PROCEDURES PERFORMED:  1) Lower extremity Doppler ultrasound - November 02, 2010 - acute deep vein thrombus noted in the right common femoral, right femoral, right popliteal, right posterior tibial veins.  CONSULTATIONS:  None.  BRIEF ADMITTING HISTORY AND PHYSICAL:  Mr. Stelle is a 60 year old male with a past medical history of chronic Coumadin therapy secondary to postoperative deep vein thrombosis and pulmonary embolism in September 2010 with subsequent IVC filter placement, and who has had  recent elective abdominal hernia repair on October 23, 2010, with uncomplicated hospital course.  The patient presented to [facility] for evaluation of right lower extremity pain.  He noted that he was in his usual state of health until the morning of admission, when he woke with a sharp, constant, 8/10 pain in the right calf, extending from above the knee to just above the ankle.  He also described associated minimal tingling and numbness in the legs.  The patient, however, denied associated shortness of breath, discomfort with inspiration or expiration, chest pain, nausea, vomiting, cough.  In regards to his recent surgery, it seems that the patient was held of his Coumadin perioperatively and was not perioperatively.  He was after further discussion with Dr. Donnie Aho, it appears that Dr. Donnie Aho and Dr.  Janee Morn discussed extensively regarding perioperative need for anticoagulation, however, it was ultimately decided that given the very low risk of recurrent thrombus that the patient would not need to be bridged with heparin perioperatively.  The patient was discharged from the hospital on October 28, 2010, at which time, he was resumed of his home Coumadin therapy, however, did not have Coumadin monitored, subsequently monitored after discharge.  PHYSICAL EXAM ON ADMISSION:  VITAL SIGNS:  Temperature 98.3, pulse 53, blood pressure 108/56, respirations 15, O2 saturation 95% on room air. GENERAL:  No acute distress, alert and oriented x3. HEENT:  Within normal limits. LUNGS:  Clear to auscultation bilaterally, normal respiratory effort, no crackles or wheezes. HEART:  Regular rate and rhythm.  No murmurs, rubs or gallops. ABDOMEN:  Midline incision with suture, it is clean dry and intact. Normoactive bowel sounds.  Soft, nondistended, nontender.  No masses or organomegaly. EXTREMITIES:  Right lower extremity is warm to touch extending from the dorsum of foot to mid thigh.  Positive Homans sign on the right. Sensation intact to light touch.  Mild fullness of the right lower extremity as compared to the left.  2+ DP pulse bilaterally. NEUROLOGIC:  Cranial nerves II to XII grossly intact. SKIN:  No visible rashes or scars, aside from as mentioned above in the abdominal section.  LABORATORY DATA:  BMET:  Sodium 135, potassium 4.1, chloride 100, glucose 133, BUN 18, creatinine 1.2.  CBC:  WBC 11.1, hemoglobin 13.1, hematocrit 39, platelets 177.  PT 17.1, INR 1.37.  HOSPITAL COURSE BY PROBLEM: 1. Deep vein thrombosis in the right lower extremity, confirmed on     Doppler ultrasound.  This DVT notably occurred in the postoperative     setting with subtherapeutic INR on admission.  Admission Doppler     ultrasound showed extensive right lower extremity DVT extending     from the  posterior tibial to femoral veins.  Secondary to these     findings, the patient was started on full dose Lovenox and resumed     on Coumadin therapy.  He completed a 3-day course of Lovenox while     inpatient until his INR became therapeutic at 2.4 on the day of     discharge.  He was discharged home with 2 additional days of     Lovenox bridging to complete to 5 day bridge.  Of note, this     patient's right lower extremity DVT occurred in the postoperative     setting after his recent elective abdominal hernia repair on     October 23, 2010.  We spoke with Dr. Donnie Aho and Dr. Janee Morn and     it seems that there is extensive  discussion regarding if there was     a need for perioperative bridging with heparin, however, the     current ACC guidelines do not provide specific guidance towards     patients with a history of DVT with IVC filter placement     surrounding elective surgery.  Furthermore, there have been no     randomized clinical trials to support a specific intervention     perioperatively, and therefore it is often thought that only the     patient is high at thrombotic risk such as CHADS2 score of greater     than 4, it should be bridged with heparin therapy perioperatively.     Upon further discussion with Dr. Donnie Aho, he recommends that in the     future given that the patient now had multiple postoperative DVTs     that he should be bridge for any subsequent surgeries that may be     required.  As previously noted the patient was resumed of Coumadin     therapy, he will complete a 5-day bridge of Lovenox bridging.     Furthermore, he will have close INR checks with Dr. Donnie Aho after     discharge. 2. Status post elective abdominal hernia repair on October 23, 2010     - On admission the surgical site seemed to be well healing without     evidence of cellulitis of surrounding skin, skin breakdown,     dehiscence of surgical site.  Dr. Janee Morn kindly visit the patient      inpatient and remove the surgical staples.  The patient will follow     up with Dr. Janee Morn within 1 month of discharge. 3. Constipation - the patient describes ongoing constipation which was     likely worsened with the use of opiates in addition to home iron     supplementation.  He was started on bowel regimen.  With subsequent     regularity of his bowel movements.  DISCHARGE VITAL SIGNS:  Temperature 98.3, pulse 50, respirations 18, blood pressure 116/65, O2 saturation 99% on room air.  DISCHARGE LABS:  PT-INR:  27-2.45.  CBC:  WBC 8.9, hemoglobin 12.6, hematocrit 37.6, platelets 308.    ______________________________ Johnette Abraham, DO   ______________________________ Cliffton Asters, M.D.    SK/MEDQ  D:  11/15/2010  T:  11/15/2010  Job:  [id number]  cc:   Georga Hacking, M.D. Gabrielle Dare Janee Morn, M.D. [facility]  Electronically Signed by Johnette Abraham DO on 11/16/2010 08:35:59 AM Electronically Signed by Cliffton Asters M.D. on 12/01/2010 03:17:02 PM

## 2010-12-03 ENCOUNTER — Ambulatory Visit (INDEPENDENT_AMBULATORY_CARE_PROVIDER_SITE_OTHER): Payer: Managed Care, Other (non HMO) | Admitting: General Surgery

## 2010-12-03 ENCOUNTER — Encounter (INDEPENDENT_AMBULATORY_CARE_PROVIDER_SITE_OTHER): Payer: Self-pay | Admitting: General Surgery

## 2010-12-03 VITALS — BP 124/88 | HR 60 | Temp 98.1°F | Resp 20 | Ht 70.0 in | Wt 170.1 lb

## 2010-12-03 DIAGNOSIS — K432 Incisional hernia without obstruction or gangrene: Secondary | ICD-10-CM

## 2010-12-03 MED ORDER — TIZANIDINE HCL 4 MG PO TABS: 4.0000 mg | ORAL_TABLET | Freq: Three times a day (TID) | ORAL | Status: AC | PRN

## 2010-12-03 MED ORDER — OXYCODONE-ACETAMINOPHEN 5-325 MG PO TABS: 1.0000 | ORAL_TABLET | Freq: Four times a day (QID) | ORAL | Status: DC | PRN

## 2010-12-03 NOTE — Progress Notes (Signed)
Subjective:     Patient ID: Jordan Long, male   DOB: 04/07/1950, 60 y.o.   MRN: [id number]  HPI Patient presents status post incisional hernia repair with mesh and component separation on October 23, 2010. He continues to have some muscular pain and spasms. He was sent to return to work next week however he is not feeling up to it. His postoperative course was completed by DVT in the right calf. He continues on Coumadin managed by Dr.Tilley.  Review of Systems     Objective:   Physical Exam Right calf edema is resolved. Abdomen is soft. His incision and hernia repair are intact. There is no sign of infection. He continues to have some muscular tenderness along the mesh area. Bowel sounds are active.    Assessment:     Status post incisional hernia repair with mesh and component separation. DVT.    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 He is not healed sufficiently to be able to return to this yet. i will change his  return to work to January 11, 2011. I refilled his oxycodone/APAP as well as Zanaflex. I will see him back in 3 weeks.

## 2010-12-17 ENCOUNTER — Ambulatory Visit (INDEPENDENT_AMBULATORY_CARE_PROVIDER_SITE_OTHER): Payer: Managed Care, Other (non HMO) | Admitting: General Surgery

## 2010-12-17 ENCOUNTER — Encounter (INDEPENDENT_AMBULATORY_CARE_PROVIDER_SITE_OTHER): Payer: Self-pay | Admitting: General Surgery

## 2010-12-17 VITALS — BP 144/78 | HR 60 | Temp 96.8°F | Resp 16 | Ht 70.0 in | Wt 172.2 lb

## 2010-12-17 DIAGNOSIS — Z8719 Personal history of other diseases of the digestive system: Secondary | ICD-10-CM

## 2010-12-17 DIAGNOSIS — K432 Incisional hernia without obstruction or gangrene: Secondary | ICD-10-CM

## 2010-12-17 DIAGNOSIS — Z9889 Other specified postprocedural states: Secondary | ICD-10-CM

## 2010-12-17 MED ORDER — OXYCODONE-ACETAMINOPHEN 5-325 MG PO TABS: 1.0000 | ORAL_TABLET | Freq: Four times a day (QID) | ORAL | Status: DC | PRN

## 2010-12-17 NOTE — Progress Notes (Signed)
Subjective:     Patient ID: Jordan Long, male   DOB: 1950/04/14, 60 y.o.   MRN: [id number]  HPI Patient is status post incisional hernia repair with component separation and mesh done on October 23, 2010. He continues to have soreness as he gradually increases his activity. He continues to only lift under 10 pounds. He has been walking more and is gaining strength. He is set to go back to work around the first week in December and he has some reservations about that. He requests refill of his pain medication.  Review of Systems     Objective:   Physical Exam Abdomen is soft and flat. Incision is clean dry and intact. Hernia repair is intact. Bowel sounds are active. There is no significant tenderness.    Assessment:     Coming along gradually after incisional hernia repair with component separation and mesh    Plan:     Increase lifting starting November 26. This should help him increase his activity to return to work. A refill his pain medication. I'll also see him back prior to returning to work to make sure things are going well.

## 2010-12-17 NOTE — Patient Instructions (Signed)
You may increase lifting starting 11/26 to build strength.

## 2010-12-31 ENCOUNTER — Encounter (INDEPENDENT_AMBULATORY_CARE_PROVIDER_SITE_OTHER): Payer: Self-pay | Admitting: General Surgery

## 2010-12-31 ENCOUNTER — Ambulatory Visit (INDEPENDENT_AMBULATORY_CARE_PROVIDER_SITE_OTHER): Payer: Managed Care, Other (non HMO) | Admitting: General Surgery

## 2010-12-31 VITALS — BP 128/88 | HR 64 | Temp 97.2°F | Resp 18 | Ht 70.0 in | Wt 170.2 lb

## 2010-12-31 DIAGNOSIS — K432 Incisional hernia without obstruction or gangrene: Secondary | ICD-10-CM

## 2010-12-31 MED ORDER — OXYCODONE-ACETAMINOPHEN 5-325 MG PO TABS
1.0000 | ORAL_TABLET | Freq: Four times a day (QID) | ORAL | Status: DC | PRN
Start: 2010-12-31 — End: 2011-03-02

## 2010-12-31 NOTE — Progress Notes (Signed)
Subjective:     Patient ID: Jordan Long, male   DOB: Oct 04, 1950, 60 y.o.   MRN: [id number]  HPI Patient presents for followup after incisional hernia repair with mesh and component separation. He continues to have some abdominal wall pain. He is begun some exercises with light weights. This is an attempt to try and get his fitness up to a level so he may return to work. He is not tolerating this very well. His job requires him to have significant amount of weight. He is requiring some pain medication.  Review of Systems     Objective:   Physical Exam Abdomen is soft and nontender. Hernia repair is intact. He is to have some tenderness over the component separation area in the right midabdomen and along the left side of the hernia repair itself. Bowel sounds are active. There are no signs of infection or other issues.    Assessment:     [place] after incisional hernia repair with mesh and component separation, however the patient is not ready to return to work as previously scheduled on December 9.    Plan:     Is critical for proper healing the patient be out of work until February 09, 2011. I do not want to his hernia repair to break down due to to significant strenuous activity prior to completion of healing. We will give him a new return to work date February 09, 2011. We'll also notify his disability insurance. I am giving him a refill on his pain medication as well. I will see him back next month prior to his returning to work.

## 2010-12-31 NOTE — Patient Instructions (Signed)
See work note.

## 2011-01-28 ENCOUNTER — Encounter (INDEPENDENT_AMBULATORY_CARE_PROVIDER_SITE_OTHER): Payer: Self-pay | Admitting: General Surgery

## 2011-01-28 ENCOUNTER — Ambulatory Visit (INDEPENDENT_AMBULATORY_CARE_PROVIDER_SITE_OTHER): Payer: Managed Care, Other (non HMO) | Admitting: General Surgery

## 2011-01-28 VITALS — BP 128/84 | HR 70 | Temp 97.9°F | Resp 18 | Ht 70.0 in | Wt 176.4 lb

## 2011-01-28 DIAGNOSIS — K432 Incisional hernia without obstruction or gangrene: Secondary | ICD-10-CM

## 2011-01-28 NOTE — Progress Notes (Addendum)
Subjective:     Patient ID: Jordan Long, male   DOB: March 26, 1950, 60 y.o.   MRN: [id number]  HPI Patient needs to recover status post repair of incisional hernia with mesh and component separation. He continues to have pain in his abdominal wall fascia after activities. 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 He is eating and moving his bowels without difficulty.  Review of Systems     Objective:   Physical Exam  Cardiovascular: Normal rate, regular rhythm and normal heart sounds.   Pulmonary/Chest: Effort normal and breath sounds normal. No respiratory distress.  Abdominal: Soft. He exhibits no distension. There is tenderness. There is no rebound and no guarding.       Very mild muscular tenderness bilaterally. Hernia repair is intact.       Assessment:     status post repair of incisional hernia with mesh and component separation   Plan:     Patient is not ready to return to work at his job requires a lot of heavy lifting and activity. He still tires easily and has pain. I feel he needs another month of recovery to prevent recurrence of this hernia. I sent documentation to this short term disability provider. I see him back next month to see where we're at. Plan return to work 03/09/2011

## 2011-02-18 ENCOUNTER — Ambulatory Visit (INDEPENDENT_AMBULATORY_CARE_PROVIDER_SITE_OTHER): Payer: Managed Care, Other (non HMO) | Admitting: General Surgery

## 2011-02-18 ENCOUNTER — Encounter (INDEPENDENT_AMBULATORY_CARE_PROVIDER_SITE_OTHER): Payer: Self-pay | Admitting: General Surgery

## 2011-02-18 VITALS — BP 118/78 | HR 72 | Temp 98.0°F | Resp 18 | Ht 70.0 in | Wt 171.6 lb

## 2011-02-18 DIAGNOSIS — K432 Incisional hernia without obstruction or gangrene: Secondary | ICD-10-CM

## 2011-02-18 NOTE — Progress Notes (Signed)
Subjective:     Patient ID: Jordan Long, male   DOB: October 28, 1950, 61 y.o.   MRN: [id number]  HPI Patient presents for followup status post incisional hernia repair with component separation and mesh. His soreness is very gradually improving his main complaint now is a lack of stamina  during exercise or activity. He would like to return to his job however it does require much heavy lifting and he is physically unable to do that at this time. He is eating and moving his bowels without difficulty.  Review of Systems     Objective:   Physical Exam  Cardiovascular: Normal rate, regular rhythm and normal heart sounds.   Pulmonary/Chest: Effort normal and breath sounds normal.  Abdomen is soft. Hernia repair is intact. Incision is healed. There is mild muscular tenderness of the upper portions on both sides.     Assessment:     Very slow recovery after incisional hernia repair. The patient is not physically ready to return to his demanding job at this time.    Plan:     The patient needs to stay out of work for another 2 months. He has registered with the [facility] clinic and I feel a full physical with laboratory studies is warranted to rule out other causes for his delayed progress. [employer] documentation to his disability company as well. I will see him back in 2 months. He has agreed to contact the clinic to make an appointment soon.

## 2011-02-21 ENCOUNTER — Emergency Department (HOSPITAL_COMMUNITY): Payer: Managed Care, Other (non HMO)

## 2011-02-21 ENCOUNTER — Other Ambulatory Visit: Payer: Self-pay

## 2011-02-21 ENCOUNTER — Encounter (HOSPITAL_COMMUNITY): Payer: Self-pay

## 2011-02-21 ENCOUNTER — Inpatient Hospital Stay (HOSPITAL_COMMUNITY)
Admission: EM | Admit: 2011-02-21 | Discharge: 2011-02-24 | DRG: 092 | Disposition: A | Discharge status: Home / Self Care | Payer: Managed Care, Other (non HMO) | Attending: Internal Medicine | Admitting: Internal Medicine

## 2011-02-21 DIAGNOSIS — I498 Other specified cardiac arrhythmias: Secondary | ICD-10-CM | POA: Diagnosis present

## 2011-02-21 DIAGNOSIS — K432 Incisional hernia without obstruction or gangrene: Secondary | ICD-10-CM

## 2011-02-21 DIAGNOSIS — Z7901 Long term (current) use of anticoagulants: Secondary | ICD-10-CM

## 2011-02-21 DIAGNOSIS — R202 Paresthesia of skin: Secondary | ICD-10-CM

## 2011-02-21 DIAGNOSIS — R209 Unspecified disturbances of skin sensation: Secondary | ICD-10-CM | POA: Diagnosis present

## 2011-02-21 DIAGNOSIS — I824Z9 Acute embolism and thrombosis of unspecified deep veins of unspecified distal lower extremity: Secondary | ICD-10-CM | POA: Diagnosis present

## 2011-02-21 DIAGNOSIS — R200 Anesthesia of skin: Secondary | ICD-10-CM

## 2011-02-21 DIAGNOSIS — R270 Ataxia, unspecified: Secondary | ICD-10-CM

## 2011-02-21 DIAGNOSIS — I82409 Acute embolism and thrombosis of unspecified deep veins of unspecified lower extremity: Secondary | ICD-10-CM

## 2011-02-21 DIAGNOSIS — R279 Unspecified lack of coordination: Principal | ICD-10-CM | POA: Diagnosis present

## 2011-02-21 DIAGNOSIS — J320 Chronic maxillary sinusitis: Secondary | ICD-10-CM | POA: Diagnosis present

## 2011-02-21 DIAGNOSIS — Z791 Long term (current) use of non-steroidal anti-inflammatories (NSAID): Secondary | ICD-10-CM

## 2011-02-21 DIAGNOSIS — J0100 Acute maxillary sinusitis, unspecified: Secondary | ICD-10-CM | POA: Diagnosis present

## 2011-02-21 LAB — COMPREHENSIVE METABOLIC PANEL
AST: 23 U/L (ref 0–37)
Albumin: 4 g/dL (ref 3.5–5.2)
Alkaline Phosphatase: 74 U/L (ref 39–117)
Chloride: 103 mEq/L (ref 96–112)
Potassium: 4 mEq/L (ref 3.5–5.1)
Total Bilirubin: 0.4 mg/dL (ref 0.3–1.2)

## 2011-02-21 LAB — PROTIME-INR: Prothrombin Time: 20 seconds — ABNORMAL HIGH (ref 11.6–15.2)

## 2011-02-21 LAB — DIFFERENTIAL
Basophils Absolute: 0 10*3/uL (ref 0.0–0.1)
Basophils Relative: 0 % (ref 0–1)
Neutro Abs: 5.2 10*3/uL (ref 1.7–7.7)
Neutrophils Relative %: 69 % (ref 43–77)

## 2011-02-21 LAB — CBC
Hemoglobin: 14.1 g/dL (ref 13.0–17.0)
MCHC: 33.6 g/dL (ref 30.0–36.0)
RDW: 14.4 % (ref 11.5–15.5)

## 2011-02-21 LAB — GLUCOSE, CAPILLARY: Glucose-Capillary: 92 mg/dL (ref 70–99)

## 2011-02-21 MED ORDER — ASPIRIN 325 MG PO TABS
325.0000 mg | ORAL_TABLET | Freq: Every day | ORAL | Status: DC
  Administered 2011-02-22 – 2011-02-24 (×4): 325 mg via ORAL
  Filled 2011-02-21 (×4): qty 1

## 2011-02-21 MED ORDER — SODIUM CHLORIDE 0.9 % IV SOLN
INTRAVENOUS | Status: DC
  Administered 2011-02-21 – 2011-02-23 (×3): via INTRAVENOUS

## 2011-02-21 MED ORDER — ACETAMINOPHEN 500 MG PO TABS
1000.0000 mg | ORAL_TABLET | Freq: Once | ORAL | Status: AC
Start: 2011-02-21 — End: 2011-02-21
  Administered 2011-02-21: 1000 mg via ORAL
  Filled 2011-02-21: qty 2

## 2011-02-21 MED ORDER — ACETAMINOPHEN 500 MG PO TABS
500.0000 mg | ORAL_TABLET | Freq: Four times a day (QID) | ORAL | Status: DC | PRN
  Administered 2011-02-22 – 2011-02-24 (×4): 500 mg via ORAL
  Filled 2011-02-21 (×4): qty 1

## 2011-02-21 MED ORDER — SODIUM CHLORIDE 0.9 % IV BOLUS (SEPSIS)
1000.0000 mL | Freq: Once | INTRAVENOUS | Status: AC
  Administered 2011-02-21: 1000 mL via INTRAVENOUS

## 2011-02-21 MED ORDER — WARFARIN SODIUM 4 MG PO TABS
4.0000 mg | ORAL_TABLET | Freq: Once | ORAL | Status: AC
  Administered 2011-02-22: 4 mg via ORAL
  Filled 2011-02-21: qty 1

## 2011-02-21 MED ORDER — OXYCODONE-ACETAMINOPHEN 5-325 MG PO TABS
1.0000 | ORAL_TABLET | Freq: Four times a day (QID) | ORAL | Status: DC | PRN
  Administered 2011-02-22 – 2011-02-24 (×6): 1 via ORAL
  Filled 2011-02-21 (×7): qty 1

## 2011-02-21 MED ORDER — DOCUSATE SODIUM 100 MG PO CAPS
100.0000 mg | ORAL_CAPSULE | Freq: Two times a day (BID) | ORAL | Status: DC | PRN
  Filled 2011-02-21: qty 1

## 2011-02-21 NOTE — ED Notes (Signed)
Patient transported to X-ray 

## 2011-02-21 NOTE — ED Provider Notes (Signed)
History     CSN: [id number]  Arrival date & time 02/21/11  1323   First MD Initiated Contact with Patient 02/21/11 1500      Chief Complaint  Patient presents with  . Tremors    (Consider location/radiation/quality/duration/timing/severity/associated sxs/prior treatment) HPI The patient presents with one day of lightheadedness, tremors.  He notes that when he went to bed last night.  He was in his usual state of health upon awakening approximately 8 hours ago.  The patient noted, disequilibrium, lightheadedness.  Since onset these symptoms have been persistent.  The patient also notes mild right temporal headache, described as dull, nonradiating, not improved with anything.  The patient denies any disorientation, confusion, nausea, vomiting, abdominal pain. Past Medical History  Diagnosis Date  . DVT (deep venous thrombosis) 2010 and 10/2010    after Hernia repair in 2010 s/p IVC filter and coumadin. Next episode after Hernia repair  ( 10/23/2010) . Admitted on 11/02/2010 for repeat DVT. Warfarin was stopped for  5 days before surgery.   . Incisional hernia 08/20/2010  . Constipation 11/20/2010    Past Surgical History  Procedure Date  . Hernia repair 2010  . Hernia repair 10/23/2010    Incisional hernia w/mesh  . Tonsillectomy     Family History  Problem Relation Age of Onset  . Cancer Father     Lymphoma   . Diabetes Mother   . Parkinsonism Sister   . Thyroid disease Sister   . Diabetes Sister     History  Substance Use Topics  . Smoking status: Former Smoker    Quit date: 09/23/1985  . Smokeless tobacco: Never Used  . Alcohol Use: Yes     occasional only. Beer.       Review of Systems  Constitutional:       Per HPI, otherwise negative  HENT:       Per HPI, otherwise negative  Eyes: Negative.   Respiratory:       Per HPI, otherwise negative  Cardiovascular:       Per HPI, otherwise negative  Gastrointestinal: Negative for vomiting.  Genitourinary:  Negative.   Musculoskeletal:       Per HPI, otherwise negative  Skin: Negative.   Neurological: Positive for tremors, weakness, light-headedness and headaches. Negative for seizures, syncope, speech difficulty and numbness.    Allergies  Review of patient's allergies indicates no known allergies.  Home Medications   Current Outpatient Rx  Name Route Sig Dispense Refill  . ADULT MULTIVITAMIN W/MINERALS CH Oral Take 1 tablet by mouth daily.    . WARFARIN SODIUM 6 MG PO TABS Oral Take 6 mg by mouth daily. Patient takes 1 1/2 tablet    . ACETAMINOPHEN 500 MG PO TABS Oral Take 500 mg by mouth every 6 (six) hours as needed.      Marland Kitchen DOCUSATE SODIUM 100 MG PO CAPS Oral Take 100 mg by mouth 2 (two) times daily as needed. For constipation     . OXYCODONE-ACETAMINOPHEN 5-325 MG PO TABS Oral Take 1 tablet by mouth every 6 (six) hours as needed for pain. 40 tablet 0  . TIZANIDINE HCL 4 MG PO TABS        BP 138/73  Pulse 64  Temp(Src) 97.8 F (36.6 C) (Oral)  Resp 20  Ht 5\' 10"  (1.778 m)  Wt 171 lb (77.565 kg)  BMI 24.54 kg/m2  SpO2 100%  Physical Exam  Nursing note and vitals reviewed. Constitutional: He is oriented to person, place,  and time. He appears well-developed. No distress.  HENT:  Head: Normocephalic and atraumatic.  Eyes: Conjunctivae and EOM are normal.  Cardiovascular: Normal rate and regular rhythm.   Pulmonary/Chest: Effort normal. No stridor. No respiratory distress.  Abdominal: He exhibits no distension.  Musculoskeletal: He exhibits no edema.  Neurological: He is alert and oriented to person, place, and time. He displays tremor. He displays no atrophy. No cranial nerve deficit or sensory deficit. He exhibits normal muscle tone. He displays a negative Romberg sign. He displays no seizure activity. Coordination and gait abnormal.  Skin: Skin is warm and dry.  Psychiatric: He has a normal mood and affect.    ED Course  Procedures (including critical care  time)   Labs Reviewed  CBC  DIFFERENTIAL  COMPREHENSIVE METABOLIC PANEL  TROPONIN I  PROTIME-INR   No results found.   No diagnosis found.   Date: 02/21/2011  Rate: 61  Rhythm: normal sinus rhythm  QRS Axis: normal  Intervals: normal  ST/T Wave abnormalities: normal  Conduction Disutrbances:none  Narrative Interpretation:   Old EKG Reviewed: not significantly changed  Cardiac monitor: 65 sr- normal Pulse ox 99% ra- normal  CXR, ct head, both reviewed by me, no acute findings  MDM  This 61 year old male presents with new, disequilibrium, nausea.  On exam, the patient is in no distress, though he has focal neurologic deficits, consistent with cerebellar dysfunction.  Given the patient's history of thrombotic disease.  There is concern for cerebellar lesion or inadequate posterior flow.  The patient's initial CAT scan does not demonstrate acute findings, and the patient's initial labs are generally reassuring, though he is subtherapeutic given.  The patient's deficits, and is subtherapeutic status.  He will be admitted to the hospitalist service at [facility] with neurology consulting.  Case was discussed with the neurologist on call, who requested this Rx.  Possible tabs were required to arrange placement, resulting in significant time delay in transferring the patient, though he remained stable throughout his ED stay.        Gerhard Munch, MD 02/21/11 1932

## 2011-02-21 NOTE — ED Notes (Signed)
Transport contacted.  Report called.

## 2011-02-21 NOTE — Progress Notes (Signed)
[facility] hospitalist progress note. Chief complaint. Gait instability. History of present illness. This 61 year old male presented to [facility] long emergency room after experiencing staggering gait starting in the a.m. and lasting all day. Admitted to rule out CVA. Patient was transferred to [facility] for MRI in the a.m. Admitted under the TIA orders. Patient seen and reviewed for stability and ensure orders transferred appropriately. Vital signs. Temperature 98.1, pulse 50, respiration 14, blood pressure 119/81. O2 sats 98%. Gen. appearance. Well-developed male in no acute distress. Alert, oriented, cooperative. Cardiac. Rhythm regular and rate mildly bradycardic. No jugular venous distention or edema. No calf pain and negative Homans. Lungs. Clear and equal. Stable O2 sats. Abdomen. Soft with positive bowel sounds. No pain. Neurologic. Cranial nerves II through XII grossly intact. No unilateral or focal defects. I did not assess patient's gait. Impression/plan. Problem #1 ataxia. Will monitor on telemetry. MRI/MRA and swallow study pending a.m. Patient on aspirin and Coumadin. Neurology listed as aware. We'll follow blood pressures but no history of hypertension. Problem #2 history DVT. Patient remains on Coumadin. INR pending with a.m. labs.

## 2011-02-21 NOTE — ED Notes (Signed)
Pt brought by ems from home with c/o tremors, pt states he awoke this am and had unsteady gait followed by tremors that are intermittent in intensity.

## 2011-02-21 NOTE — Progress Notes (Signed)
ANTICOAGULATION CONSULT NOTE - Initial Consult  Pharmacy Consult for Coumadin Indication: VTE treatment  No Known Allergies  Patient Measurements: Height: 5\' 10"  (177.8 cm) Weight: 171 lb (77.565 kg) IBW/kg (Calculated) : 73   Vital Signs: Temp: 98.1 F (36.7 C) (01/19 1845) Temp src: Oral (01/19 1845) BP: 119/81 mmHg (01/19 2030) Pulse Rate: 50  (01/19 2030)  Labs:  [facility] 02/21/11 1534  HGB 14.1  HCT 42.0  PLT 243  APTT --  LABPROT 20.0*  INR 1.67*  HEPARINUNFRC --  CREATININE 1.02  CKTOTAL --  CKMB --  TROPONINI <0.30   Estimated Creatinine Clearance: 77.5 ml/min (by C-G formula based on Cr of 1.02).  Medical History: Past Medical History  Diagnosis Date  . DVT (deep venous thrombosis) 2010 and 10/2010    after Hernia repair in 2010 s/p IVC filter and coumadin. Next episode after Hernia repair  ( 10/23/2010) . Admitted on 11/02/2010 for repeat DVT. Warfarin was stopped for  5 days before surgery.   . Incisional hernia 08/20/2010  . Constipation 11/20/2010    Medications:  Prescriptions prior to admission  Medication Sig Dispense Refill  . Multiple Vitamin (MULITIVITAMIN WITH MINERALS) TABS Take 1 tablet by mouth daily.      Marland Kitchen warfarin (COUMADIN) 6 MG tablet Take 6 mg by mouth daily. Patient takes 1 1/2 tablet      . acetaminophen (TYLENOL) 500 MG tablet Take 500 mg by mouth every 6 (six) hours as needed.        . docusate sodium (COLACE) 100 MG capsule Take 100 mg by mouth 2 (two) times daily as needed. For constipation       . oxyCODONE-acetaminophen (ROXICET) 5-325 MG per tablet Take 1 tablet by mouth every 6 (six) hours as needed for pain.  40 tablet  0  . tiZANidine (ZANAFLEX) 4 MG tablet         Assessment: 61 year old male with history of DVT in 2010 (IVC filter in place) on Coumadin prior to admission and repeat DVT in 11/02/10.  INR 1.67.  Patient was  9mg  daily. INR is subtherapeutic. Patients last dose of Coumadin was today.   Goal of Therapy:    INR 2-3   Plan:  1. As Coumadin is subtherapeutic on home dose, with give extra 4mg  po x1 tonight. 2. Follow up PT/INR in am. 3. Clarify Home dose of Coumadin in AM.   Fayne Norrie 02/21/2011,10:01 PM

## 2011-02-21 NOTE — ED Notes (Signed)
Patient denies pain and is resting comfortably.  

## 2011-02-21 NOTE — H&P (Addendum)
PCP:  [facility]  Chief Complaint:  Loss of balance   HPI: This is a healthy 61 year old gentleman who states he got up this morning and while shaving he started staggering. He states this continued all day. He was also extremely weak. He reports no lightheadedness, no dizziness, no headaches, no slurred speech, no localized weakness. He does have some mild blurred vision, and some tingling in both hands. He states he has not been ill. No fevers, no chills, no nausea, no diarrhea. As a taxi persisted he finally presented to the urgent care and was transferred to the ER. He has no history of hypertension. History presented by the patient, his son is at the bedside  Review of Systems:  anorexia, fever, weight loss,, vision loss, decreased hearing, hoarseness, chest pain, syncope, dyspnea on exertion, peripheral edema, balance deficits, hemoptysis, abdominal pain, melena, hematochezia, severe indigestion/heartburn, hematuria, incontinence, genital sores, muscle weakness, suspicious skin lesions, transient blindness, difficulty walking, depression, unusual weight change, abnormal bleeding, enlarged lymph nodes, angioedema, and breast masses.  Past Medical History: Past Medical History  Diagnosis Date  . DVT (deep venous thrombosis) 2010 and 10/2010    after Hernia repair in 2010 s/p IVC filter and coumadin. Next episode after Hernia repair  ( 10/23/2010) . Admitted on 11/02/2010 for repeat DVT. Warfarin was stopped for  5 days before surgery.   . Incisional hernia 08/20/2010  . Constipation 11/20/2010   Past Surgical History  Procedure Date  . Hernia repair 2010  . Hernia repair 10/23/2010    Incisional hernia w/mesh  . Tonsillectomy     Medications: Prior to Admission medications   Medication Sig Start Date End Date Taking? Authorizing Provider  Multiple Vitamin (MULITIVITAMIN WITH MINERALS) TABS Take 1 tablet by mouth daily.   Yes Historical Provider, MD  warfarin (COUMADIN) 6 MG tablet  Take 6 mg by mouth daily. Patient takes 1 1/2 tablet   Yes Historical Provider, MD  acetaminophen (TYLENOL) 500 MG tablet Take 500 mg by mouth every 6 (six) hours as needed.      Historical Provider, MD  docusate sodium (COLACE) 100 MG capsule Take 100 mg by mouth 2 (two) times daily as needed. For constipation     Historical Provider, MD  oxyCODONE-acetaminophen (ROXICET) 5-325 MG per tablet Take 1 tablet by mouth every 6 (six) hours as needed for pain. 12/31/10 12/31/11  Liz Malady, MD  tiZANidine (ZANAFLEX) 4 MG tablet  12/13/10   Historical Provider, MD    Allergies:  No Known Allergies  Social History:  reports that he quit smoking about 25 years ago. He has never used smokeless tobacco. He reports that he drinks alcohol. He reports that he does not use illicit drugs.  Family History: Family History  Problem Relation Age of Onset  . Cancer Father     Lymphoma   . Diabetes Mother   . Parkinsonism Sister   . Thyroid disease Sister   . Diabetes Sister     Physical Exam: Filed Vitals:   02/21/11 1954 02/21/11 2005 02/21/11 2012 02/21/11 2030  BP:    119/81  Pulse: 52 54 51 50  Temp:      TempSrc:      Resp: 18 21 17 14   Height:      Weight:      SpO2: 97% 97% 97% 98%    General:  Alert and oriented times three, well developed and nourished, no acute distress Eyes: PERRLA, pink conjunctiva, no scleral icterus ENT: Moist  oral mucosa, neck supple, no thyromegaly Lungs: clear to ascultation, no wheeze, no crackles, no use of accessory muscles Cardiovascular: regular rate and rhythm, no regurgitation, no gallops, no murmurs. No carotid bruits, no JVD Abdomen: soft, positive BS, non-tender, non-distended, no organomegaly, not an acute abdomen GU: not examined Neuro: CN II - XII grossly intact, sensation intact Musculoskeletal: strength 5/5 all extremities except the right lower extremity which is a proximal to 4.5/5-the patient states he has chronic weakness in that  right lower extremity since his DVTs, no clubbing, cyanosis or edema Skin: no rash, no subcutaneous crepitation, no decubitus Psych: appropriate patient   Labs on Admission:   [facility] 02/21/11 1534  NA 138  K 4.0  CL 103  CO2 24  GLUCOSE 88  BUN 14  CREATININE 1.02  CALCIUM 9.3  MG --  PHOS --    Basename 02/21/11 1534  AST 23  ALT 24  ALKPHOS 74  BILITOT 0.4  PROT 7.5  ALBUMIN 4.0   No results found for this basename: LIPASE:2,AMYLASE:2 in the last 72 hours  Basename 02/21/11 1534  WBC 7.6  NEUTROABS 5.2  HGB 14.1  HCT 42.0  MCV 88.1  PLT 243    Basename 02/21/11 1534  CKTOTAL --  CKMB --  CKMBINDEX --  TROPONINI <0.30   No components found with this basename: POCBNP:3 No results found for this basename: DDIMER:2 in the last 72 hours No results found for this basename: HGBA1C:2 in the last 72 hours No results found for this basename: CHOL:2,HDL:2,LDLCALC:2,TRIG:2,CHOLHDL:2,LDLDIRECT:2 in the last 72 hours No results found for this basename: TSH,T4TOTAL,FREET3,T3FREE,THYROIDAB in the last 72 hours No results found for this basename: VITAMINB12:2,FOLATE:2,FERRITIN:2,TIBC:2,IRON:2,RETICCTPCT:2 in the last 72 hours  Micro Results: No results found for this or any previous visit (from the past 240 hour(s)).   Radiological Exams on Admission: Dg Chest 2 View  02/21/2011  *RADIOLOGY REPORT*  Clinical Data: Cough for 5 days.  Tremors with elevated blood pressure, dizziness and weakness.  CHEST - 2 VIEW  Comparison: 11/03/2008 radiographs. Abdominal CT 01/13/2009.  Findings: There is interval improved aeration of the lung bases.  A small nodular density projecting over the left lung base on the frontal examination is most consistent with a nipple shadow.  There is no confluent airspace opacity or pleural effusion.  There is anterior wedging of one of the lower thoracic vertebral bodies which appears new compared with the prior abdominal CT.  However, this does not  have an acute appearance.  IMPRESSION:  1.  No acute cardiopulmonary process. 2.  Interval lower thoracic compression deformity does not have an acute appearance - correlate clinically. Per CMS PQRS reporting requirements (PQRS Measure 24): Given the patient's age of greater than 50 and the fracture site (hip, distal radius, or spine), the patient should be tested for osteoporosis using DXA, and the appropriate treatment considered based on the DXA results.  Original Report Authenticated By: Gerrianne Scale, M.D.   Ct Head Wo Contrast  02/21/2011  *RADIOLOGY REPORT*  Clinical Data: Altered mental status.  Sudden onset of dizziness today.  CT HEAD WITHOUT CONTRAST  Technique:  Contiguous axial images were obtained from the base of the skull through the vertex without contrast.  Comparison: None.  Findings: There is no evidence for acute infarction, intracranial hemorrhage, mass lesion, hydrocephalus, or extra-axial fluid.  Mild atrophy.  Mild chronic microvascular ischemic change.  Calvarium intact.  Chronic and acute maxillary sinus disease, worse on the right.  IMPRESSION: Mild atrophy  and chronic microvascular ischemic change.  No visible acute intracranial infarction or hemorrhage. Maxillary sinus disease, worse on the right.  Original Report Authenticated By: Elsie Stain, M.D.   EKG; NSR  Assessment/Plan Present on Admission:  Ataxia Admit to telemetry Rule out CVA TIA order set, MRI/A in the a.m. n.p.o. and swallow study, IV fluids, aspirin NEeurology aware Dr Christianne Borrow No evidence of hypertension The patient does have some occasional bradycardia, we'll check TSH, and monitor in telemetry. Echo ordered .DVT (deep venous thrombosis) Status post [place] filter Pharmacy to manage Coumadin  Full code Coumadin as DVT prophylaxis  Team 3/Dr. Sammuel Cooper, Makar Slatter 02/21/2011, 9:43 PM

## 2011-02-22 ENCOUNTER — Inpatient Hospital Stay (HOSPITAL_COMMUNITY): Payer: Managed Care, Other (non HMO)

## 2011-02-22 DIAGNOSIS — R200 Anesthesia of skin: Secondary | ICD-10-CM

## 2011-02-22 DIAGNOSIS — R202 Paresthesia of skin: Secondary | ICD-10-CM

## 2011-02-22 LAB — CBC
Hemoglobin: 13.5 g/dL (ref 13.0–17.0)
MCH: 29.7 pg (ref 26.0–34.0)
MCHC: 33.3 g/dL (ref 30.0–36.0)
Platelets: 202 10*3/uL (ref 150–400)
RDW: 14.5 % (ref 11.5–15.5)

## 2011-02-22 LAB — BASIC METABOLIC PANEL
BUN: 18 mg/dL (ref 6–23)
Calcium: 9.1 mg/dL (ref 8.4–10.5)
Chloride: 106 mEq/L (ref 96–112)
Creatinine, Ser: 1.12 mg/dL (ref 0.50–1.35)
GFR calc Af Amer: 80 mL/min — ABNORMAL LOW (ref >90)
GFR calc non Af Amer: 69 mL/min — ABNORMAL LOW (ref >90)

## 2011-02-22 LAB — RAPID URINE DRUG SCREEN, HOSP PERFORMED
Amphetamines: NOT DETECTED
Benzodiazepines: NOT DETECTED
Opiates: NOT DETECTED

## 2011-02-22 LAB — GLUCOSE, CAPILLARY: Glucose-Capillary: 100 mg/dL — ABNORMAL HIGH (ref 70–99)

## 2011-02-22 LAB — LIPID PANEL
Cholesterol: 185 mg/dL (ref 0–200)
HDL: 51 mg/dL (ref >39)
LDL Cholesterol: 118 mg/dL — ABNORMAL HIGH (ref 0–99)
Total CHOL/HDL Ratio: 3.6 RATIO
Triglycerides: 80 mg/dL (ref <150)
VLDL: 16 mg/dL (ref 0–40)

## 2011-02-22 LAB — HEMOGLOBIN A1C: Mean Plasma Glucose: 117 mg/dL — ABNORMAL HIGH (ref <117)

## 2011-02-22 LAB — PROTIME-INR: Prothrombin Time: 22.6 seconds — ABNORMAL HIGH (ref 11.6–15.2)

## 2011-02-22 MED ORDER — WARFARIN SODIUM 6 MG PO TABS
9.0000 mg | ORAL_TABLET | Freq: Once | ORAL | Status: AC
  Administered 2011-02-22: 9 mg via ORAL
  Filled 2011-02-22: qty 1

## 2011-02-22 MED ORDER — STROKE: EARLY STAGES OF RECOVERY BOOK
Freq: Once | Status: AC
  Administered 2011-02-22: 02:00:00
  Filled 2011-02-22 (×2): qty 1

## 2011-02-22 NOTE — Progress Notes (Signed)
*  PRELIMINARY RESULTS*  Carotid Dopplers has been performed.  No significant ICA stenosis bilaterally. Vertebral arteries are patent with antegrade flow.  Jordan Long 02/22/2011, 10:40 AM

## 2011-02-22 NOTE — Progress Notes (Signed)
Subjective: Still complains of bilateral hand numbness.  Objective: Vital signs in last 24 hours: Temp:  [97.6 F (36.4 C)-98.2 F (36.8 C)] 97.6 F (36.4 C) (01/20 1413) Pulse Rate:  [50-63] 58  (01/20 1413) Resp:  [14-21] 18  (01/20 1413) BP: (117-148)/(68-83) 117/77 mmHg (01/20 1413) SpO2:  [95 %-98 %] 95 % (01/20 1413) Weight:  [76.7 kg (169 lb 1.5 oz)] 76.7 kg (169 lb 1.5 oz) (01/19 2200) Weight change:  Last BM Date: 02/21/11  Intake/Output from previous day: 01/19 0701 - 01/20 0700 In: 840 [P.O.:240; I.V.:600] Out: 525 [Urine:525] Total I/O In: 360 [P.O.:360] Out: 775 [Urine:775]   Physical Exam: General: Alert, awake, oriented x3, in no acute distress. HEENT: No bruits, no goiter. Heart: Regular rate and rhythm, without murmurs, rubs, gallops. Lungs: Clear to auscultation bilaterally. Abdomen: Soft, nontender, nondistended, positive bowel sounds. Extremities: No clubbing cyanosis or edema with positive pedal pulses. Neuro: Grossly intact, nonfocal.    Lab Results: Basic Metabolic Panel:  Basename 02/22/11 0500 02/21/11 1534  NA 139 138  K 4.5 4.0  CL 106 103  CO2 25 24  GLUCOSE 84 88  BUN 18 14  CREATININE 1.12 1.02  CALCIUM 9.1 9.3  MG -- --  PHOS -- --   Liver Function Tests:  Basename 02/21/11 1534  AST 23  ALT 24  ALKPHOS 74  BILITOT 0.4  PROT 7.5  ALBUMIN 4.0   CBC:  Basename 02/22/11 0835 02/21/11 1534  WBC 6.8 7.6  NEUTROABS -- 5.2  HGB 13.5 14.1  HCT 40.6 42.0  MCV 89.2 88.1  PLT 202 243   Cardiac Enzymes:  Basename 02/21/11 1534  CKTOTAL --  CKMB --  CKMBINDEX --  TROPONINI <0.30   CBG:  Basename 02/22/11 1117 02/22/11 0616 02/21/11 2215  GLUCAP 98 100* 92   Hemoglobin A1C:  Basename 02/21/11 2154  HGBA1C 5.7*   Fasting Lipid Panel:  Basename 02/22/11 0500  CHOL 185  HDL 51  LDLCALC 118*  TRIG 80  CHOLHDL 3.6  LDLDIRECT --   Thyroid Function Tests:  Basename 02/22/11 0500  TSH 4.736*  T4TOTAL --    FREET4 --  T3FREE --  THYROIDAB --   Coagulation:  Basename 02/22/11 0500 02/21/11 1534  LABPROT 22.6* 20.0*  INR 1.95* 1.67*   Urine Drug Screen: Drugs of Abuse     Component Value Date/Time   LABOPIA NONE DETECTED 02/22/2011 0025   COCAINSCRNUR NONE DETECTED 02/22/2011 0025   LABBENZ NONE DETECTED 02/22/2011 0025   AMPHETMU NONE DETECTED 02/22/2011 0025   THCU NONE DETECTED 02/22/2011 0025   LABBARB NONE DETECTED 02/22/2011 0025     Recent Results (from the past 240 hour(s))  MRSA PCR SCREENING     Status: Normal   Collection Time   02/21/11 10:57 PM      Component Value Range Status Comment   MRSA by PCR NEGATIVE  NEGATIVE  Final     Studies/Results: Dg Chest 2 View  02/21/2011  *RADIOLOGY REPORT*  Clinical Data: Cough for 5 days.  Tremors with elevated blood pressure, dizziness and weakness.  CHEST - 2 VIEW  Comparison: 11/03/2008 radiographs. Abdominal CT 01/13/2009.  Findings: There is interval improved aeration of the lung bases.  A small nodular density projecting over the left lung base on the frontal examination is most consistent with a nipple shadow.  There is no confluent airspace opacity or pleural effusion.  There is anterior wedging of one of the lower thoracic vertebral bodies which appears new  compared with the prior abdominal CT.  However, this does not have an acute appearance.  IMPRESSION:  1.  No acute cardiopulmonary process. 2.  Interval lower thoracic compression deformity does not have an acute appearance - correlate clinically. Per CMS PQRS reporting requirements (PQRS Measure 24): Given the patient's age of greater than 50 and the fracture site (hip, distal radius, or spine), the patient should be tested for osteoporosis using DXA, and the appropriate treatment considered based on the DXA results.  Original Report Authenticated By: Gerrianne Scale, M.D.   Ct Head Wo Contrast  02/21/2011  *RADIOLOGY REPORT*  Clinical Data: Altered mental status.  Sudden  onset of dizziness today.  CT HEAD WITHOUT CONTRAST  Technique:  Contiguous axial images were obtained from the base of the skull through the vertex without contrast.  Comparison: None.  Findings: There is no evidence for acute infarction, intracranial hemorrhage, mass lesion, hydrocephalus, or extra-axial fluid.  Mild atrophy.  Mild chronic microvascular ischemic change.  Calvarium intact.  Chronic and acute maxillary sinus disease, worse on the right.  IMPRESSION: Mild atrophy and chronic microvascular ischemic change.  No visible acute intracranial infarction or hemorrhage. Maxillary sinus disease, worse on the right.  Original Report Authenticated By: Elsie Stain, M.D.   Mri Brain Without Contrast  02/22/2011  *RADIOLOGY REPORT*  Clinical Data:  Acute imbalance. Nausea.  Deep venous thrombosis. Acute but ill defined cerebrovascular disease.  MRI HEAD WITHOUT CONTRAST MRA HEAD WITHOUT CONTRAST  Technique:  Multiplanar, multiecho pulse sequences of the brain and surrounding structures were obtained without intravenous contrast. Angiographic images of the head were obtained using MRA technique without contrast.  Comparison:  CT head 02/21/2011  MRI HEAD  Findings:  There is no evidence for acute infarction, intracranial hemorrhage, mass lesion, hydrocephalus, or extra-axial fluid.  Mild premature atrophy is present. Minimal chronic microvascular ischemic changes are present in the periventricular white matter. Tiny deep white matter lacune left insular subcortical white matter.  No brainstem ischemic demyelination is seen.  There are no acute or chronic cerebellar infarcts.  No foci of chronic hemorrhage are seen.  Major intracranial vascular structures are widely patent.  Pituitary and cerebellar tonsils unremarkable. Upper cervical region appears normal.  Moderate sinus disease affects the right greater than left maxillary regions, with a suspected acute on chronic component.  Right greater than left  ethmoid sinus fluid is also present. No significant mastoid fluid is seen. Normal orbits. Compared with prior CT there is a similar appearance.  IMPRESSION: Mild atrophy and minimal small vessel disease.  No acute intracranial findings. There is no visible acute brainstem or cerebellar abnormality. Right greater than left acute and chronic maxillary sinusitis.  MRA HEAD  Findings: Widely patent carotid arteries. Basilar artery is primarily supplied by the left vertebral which is also widely patent.  There is no visible intracranial stenosis or aneurysm.  IMPRESSION: Unremarkable MR angiography intracranial circulation.  Original Report Authenticated By: Elsie Stain, M.D.   Mr Mra Head/brain Wo Cm  02/22/2011  *RADIOLOGY REPORT*  Clinical Data:  Acute imbalance. Nausea.  Deep venous thrombosis. Acute but ill defined cerebrovascular disease.  MRI HEAD WITHOUT CONTRAST MRA HEAD WITHOUT CONTRAST  Technique:  Multiplanar, multiecho pulse sequences of the brain and surrounding structures were obtained without intravenous contrast. Angiographic images of the head were obtained using MRA technique without contrast.  Comparison:  CT head 02/21/2011  MRI HEAD  Findings:  There is no evidence for acute infarction, intracranial hemorrhage, mass  lesion, hydrocephalus, or extra-axial fluid.  Mild premature atrophy is present. Minimal chronic microvascular ischemic changes are present in the periventricular white matter. Tiny deep white matter lacune left insular subcortical white matter.  No brainstem ischemic demyelination is seen.  There are no acute or chronic cerebellar infarcts.  No foci of chronic hemorrhage are seen.  Major intracranial vascular structures are widely patent.  Pituitary and cerebellar tonsils unremarkable. Upper cervical region appears normal.  Moderate sinus disease affects the right greater than left maxillary regions, with a suspected acute on chronic component.  Right greater than left ethmoid  sinus fluid is also present. No significant mastoid fluid is seen. Normal orbits. Compared with prior CT there is a similar appearance.  IMPRESSION: Mild atrophy and minimal small vessel disease.  No acute intracranial findings. There is no visible acute brainstem or cerebellar abnormality. Right greater than left acute and chronic maxillary sinusitis.  MRA HEAD  Findings: Widely patent carotid arteries. Basilar artery is primarily supplied by the left vertebral which is also widely patent.  There is no visible intracranial stenosis or aneurysm.  IMPRESSION: Unremarkable MR angiography intracranial circulation.  Original Report Authenticated By: Elsie Stain, M.D.    Medications: Scheduled Meds:   .  stroke: mapping our early stages of recovery book   Does not apply Once  . aspirin  325 mg Oral Daily  . warfarin  4 mg Oral Once  . warfarin  9 mg Oral ONCE-1800   Continuous Infusions:   . sodium chloride 75 mL/hr at 02/22/11 1048   PRN Meds:.acetaminophen, docusate sodium, oxyCODONE-acetaminophen  Assessment/Plan:  Principal Problem:  *Numbness and tingling in hands Active Problems:  DVT (deep venous thrombosis)  Ataxia   #1 numbness and tingling of both hands: MRI is negative for CVA. Unlikely for CVA/TIA to cause bilateral symptoms. This most likely points to a cervical problem, however I believe it is very low yield to order a cervical spine MRI at this point given the fact that he has no pain or any other symptoms. At this point will await completion of stroke workup mainly 2-D echo and PT and OT eval prior to discharge home. Carotid Dopplers are within normal limits.   LOS: 1 day   [facility] Pager: [phone] 02/22/2011, 5:26 PM

## 2011-02-22 NOTE — Progress Notes (Signed)
ANTICOAGULATION CONSULT NOTE - Follow up  Pharmacy Consult for Coumadin Indication: hx of DVT  No Known Allergies  Patient Measurements: Height: 5\' 10"  (177.8 cm) Weight: 169 lb 1.5 oz (76.7 kg) IBW/kg (Calculated) : 73   Vital Signs: Temp: 98.2 F (36.8 C) (01/20 0536) Temp src: Oral (01/20 0536) BP: 133/79 mmHg (01/20 0536) Pulse Rate: 63  (01/20 0536)  Labs:  Basename 02/22/11 0835 02/22/11 0500 02/21/11 1534  HGB 13.5 -- 14.1  HCT 40.6 -- 42.0  PLT 202 -- 243  APTT -- -- --  LABPROT -- 22.6* 20.0*  INR -- 1.95* 1.67*  HEPARINUNFRC -- -- --  CREATININE -- 1.12 1.02  CKTOTAL -- -- --  CKMB -- -- --  TROPONINI -- -- <0.30   Estimated Creatinine Clearance: 70.6 ml/min (by C-G formula based on Cr of 1.12).  Medical History: Past Medical History  Diagnosis Date  . DVT (deep venous thrombosis) 2010 and 10/2010    after Hernia repair in 2010 s/p IVC filter and coumadin. Next episode after Hernia repair  ( 10/23/2010) . Admitted on 11/02/2010 for repeat DVT. Warfarin was stopped for  5 days before surgery.   . Incisional hernia 08/20/2010  . Constipation 11/20/2010  . Headache     Medications:  Prescriptions prior to admission  Medication Sig Dispense Refill  . Multiple Vitamin (MULITIVITAMIN WITH MINERALS) TABS Take 1 tablet by mouth daily.      Marland Kitchen warfarin (COUMADIN) 6 MG tablet Take 9 mg by mouth daily.      Marland Kitchen acetaminophen (TYLENOL) 500 MG tablet Take 500 mg by mouth every 6 (six) hours as needed.        . docusate sodium (COLACE) 100 MG capsule Take 100 mg by mouth 2 (two) times daily as needed. For constipation       . oxyCODONE-acetaminophen (ROXICET) 5-325 MG per tablet Take 1 tablet by mouth every 6 (six) hours as needed for pain.  40 tablet  0  . tiZANidine (ZANAFLEX) 4 MG tablet         Assessment: 61 year old male with history of DVT (2010 -IVC filter in place, and repeat DVT in 11/02/10).  Patient was on coumadin 9mg  daily except 6mg  on Wednesdays PTA.  INR  is slightly subtherapeutic.  Goal of Therapy:  INR 2-3   Plan:  1. Coumadin 9mg  today.  2. Follow up PT/INR in am.  Marylouise Stacks 02/22/2011,9:10 AM

## 2011-02-23 ENCOUNTER — Encounter (HOSPITAL_COMMUNITY): Payer: Self-pay | Admitting: *Deleted

## 2011-02-23 LAB — GLUCOSE, CAPILLARY

## 2011-02-23 LAB — PROTIME-INR
INR: 2.3 — ABNORMAL HIGH (ref 0.00–1.49)
Prothrombin Time: 25.7 s — ABNORMAL HIGH (ref 11.6–15.2)

## 2011-02-23 MED ORDER — LEVOFLOXACIN 750 MG PO TABS
750.0000 mg | ORAL_TABLET | Freq: Every day | ORAL | Status: DC
  Administered 2011-02-23 – 2011-02-24 (×2): 750 mg via ORAL
  Filled 2011-02-23 (×2): qty 1

## 2011-02-23 MED ORDER — WARFARIN SODIUM 6 MG PO TABS
6.0000 mg | ORAL_TABLET | Freq: Once | ORAL | Status: AC
  Administered 2011-02-23: 6 mg via ORAL
  Filled 2011-02-23: qty 1

## 2011-02-23 MED ORDER — SODIUM CHLORIDE 0.9 % IJ SOLN
3.0000 mL | Freq: Two times a day (BID) | INTRAMUSCULAR | Status: DC
  Administered 2011-02-23 – 2011-02-24 (×2): 3 mL via INTRAVENOUS

## 2011-02-23 NOTE — Progress Notes (Signed)
*  PRELIMINARY RESULTS* Echocardiogram 2D Echocardiogram has been performed.  Glean Salen [facility] 02/23/2011, 3:40 PM

## 2011-02-23 NOTE — Progress Notes (Signed)
Subjective: Chart reviewed. Patient complains of feeling dizzy and like he was going to pass out after walking back from the bathroom and sitting on the chair. He denied any headache, chest pain. He complains of chronic numbness in bilateral fourth and fifth fingers that he's had since 2010 with no we'll change recently. He denies any neck pain no history of neck surgery. He has been generally weak since the hernia surgery he had in September of last year. He lives alone. He does complain of some gait instability. According to his sister Ms. Fannie Knee, patient also was markedly hypertensive at home prior to admission and he's not been on any antihypertensive medications.  Objective: Blood pressure 129/79, pulse 59, temperature 98.4 F (36.9 C), temperature source Oral, resp. rate 18, height 5\' 10"  (1.778 m), weight 77.202 kg (170 lb 3.2 oz), SpO2 99.00%.  Intake/Output Summary (Last 24 hours) at 02/23/11 1232 Last data filed at 02/23/11 1155  Gross per 24 hour  Intake   2845 ml  Output   2550 ml  Net    295 ml    General Exam: Comfortable. Sitting on chair and in no obvious distress. Respiratory System: Clear. No increased work of breathing. Cardiovascular System: First and second heart sounds heard. Regular rate and rhythm. No JVD/murmurs. Telemetry shows sinus bradycardia in the 50s Gastrointestinal System: Abdomen is non distended, soft and normal bowel sounds heard. Central Nervous System: Alert and oriented. No focal neurological deficits. Extremities: Grade 5 x 5 power symmetrically. No pronator drift.  Lab Results: Basic Metabolic Panel:  Basename 02/22/11 0500 02/21/11 1534  NA 139 138  K 4.5 4.0  CL 106 103  CO2 25 24  GLUCOSE 84 88  BUN 18 14  CREATININE 1.12 1.02  CALCIUM 9.1 9.3  MG -- --  PHOS -- --   Liver Function Tests:  Basename 02/21/11 1534  AST 23  ALT 24  ALKPHOS 74  BILITOT 0.4  PROT 7.5  ALBUMIN 4.0   No results found for this basename:  LIPASE:2,AMYLASE:2 in the last 72 hours No results found for this basename: AMMONIA:2 in the last 72 hours CBC:  Basename 02/22/11 0835 02/21/11 1534  WBC 6.8 7.6  NEUTROABS -- 5.2  HGB 13.5 14.1  HCT 40.6 42.0  MCV 89.2 88.1  PLT 202 243   Cardiac Enzymes:  Basename 02/21/11 1534  CKTOTAL --  CKMB --  CKMBINDEX --  TROPONINI <0.30   BNP: No results found for this basename: PROBNP:3 in the last 72 hours D-Dimer: No results found for this basename: DDIMER:2 in the last 72 hours CBG:  Basename 02/23/11 1054 02/23/11 0644 02/22/11 2146 02/22/11 1620 02/22/11 1117 02/22/11 0616  GLUCAP 121* 79 86 93 98 100*   Hemoglobin A1C:  Basename 02/21/11 2154  HGBA1C 5.7*   Fasting Lipid Panel:  Basename 02/22/11 0500  CHOL 185  HDL 51  LDLCALC 118*  TRIG 80  CHOLHDL 3.6  LDLDIRECT --   Thyroid Function Tests:  Basename 02/22/11 0500  TSH 4.736*  T4TOTAL --  FREET4 --  T3FREE --  THYROIDAB --   Anemia Panel: No results found for this basename: VITAMINB12,FOLATE,FERRITIN,TIBC,IRON,RETICCTPCT in the last 72 hours Coagulation:  Basename 02/23/11 0500 02/22/11 0500  LABPROT 25.7* 22.6*  INR 2.30* 1.95*   Urine Drug Screen: Drugs of Abuse     Component Value Date/Time   LABOPIA NONE DETECTED 02/22/2011 0025   COCAINSCRNUR NONE DETECTED 02/22/2011 0025   LABBENZ NONE DETECTED 02/22/2011 0025   AMPHETMU  NONE DETECTED 02/22/2011 0025   THCU NONE DETECTED 02/22/2011 0025   LABBARB NONE DETECTED 02/22/2011 0025    Alcohol Level: No results found for this basename: ETH:2 in the last 72 hours  Micro Results: Recent Results (from the past 240 hour(s))  MRSA PCR SCREENING     Status: Normal   Collection Time   02/21/11 10:57 PM      Component Value Range Status Comment   MRSA by PCR NEGATIVE  NEGATIVE  Final     Studies/Results: Dg Chest 2 View  02/21/2011  *RADIOLOGY REPORT*  Clinical Data: Cough for 5 days.  Tremors with elevated blood pressure, dizziness and  weakness.  CHEST - 2 VIEW  Comparison: 11/03/2008 radiographs. Abdominal CT 01/13/2009.  Findings: There is interval improved aeration of the lung bases.  A small nodular density projecting over the left lung base on the frontal examination is most consistent with a nipple shadow.  There is no confluent airspace opacity or pleural effusion.  There is anterior wedging of one of the lower thoracic vertebral bodies which appears new compared with the prior abdominal CT.  However, this does not have an acute appearance.  IMPRESSION:  1.  No acute cardiopulmonary process. 2.  Interval lower thoracic compression deformity does not have an acute appearance - correlate clinically. Per CMS PQRS reporting requirements (PQRS Measure 24): Given the patient's age of greater than 50 and the fracture site (hip, distal radius, or spine), the patient should be tested for osteoporosis using DXA, and the appropriate treatment considered based on the DXA results.  Original Report Authenticated By: Gerrianne Scale, M.D.   Ct Head Wo Contrast  02/21/2011  *RADIOLOGY REPORT*  Clinical Data: Altered mental status.  Sudden onset of dizziness today.  CT HEAD WITHOUT CONTRAST  Technique:  Contiguous axial images were obtained from the base of the skull through the vertex without contrast.  Comparison: None.  Findings: There is no evidence for acute infarction, intracranial hemorrhage, mass lesion, hydrocephalus, or extra-axial fluid.  Mild atrophy.  Mild chronic microvascular ischemic change.  Calvarium intact.  Chronic and acute maxillary sinus disease, worse on the right.  IMPRESSION: Mild atrophy and chronic microvascular ischemic change.  No visible acute intracranial infarction or hemorrhage. Maxillary sinus disease, worse on the right.  Original Report Authenticated By: Elsie Stain, M.D.   Mri Brain Without Contrast  02/22/2011  *RADIOLOGY REPORT*  Clinical Data:  Acute imbalance. Nausea.  Deep venous thrombosis. Acute but  ill defined cerebrovascular disease.  MRI HEAD WITHOUT CONTRAST MRA HEAD WITHOUT CONTRAST  Technique:  Multiplanar, multiecho pulse sequences of the brain and surrounding structures were obtained without intravenous contrast. Angiographic images of the head were obtained using MRA technique without contrast.  Comparison:  CT head 02/21/2011  MRI HEAD  Findings:  There is no evidence for acute infarction, intracranial hemorrhage, mass lesion, hydrocephalus, or extra-axial fluid.  Mild premature atrophy is present. Minimal chronic microvascular ischemic changes are present in the periventricular white matter. Tiny deep white matter lacune left insular subcortical white matter.  No brainstem ischemic demyelination is seen.  There are no acute or chronic cerebellar infarcts.  No foci of chronic hemorrhage are seen.  Major intracranial vascular structures are widely patent.  Pituitary and cerebellar tonsils unremarkable. Upper cervical region appears normal.  Moderate sinus disease affects the right greater than left maxillary regions, with a suspected acute on chronic component.  Right greater than left ethmoid sinus fluid is also present. No significant  mastoid fluid is seen. Normal orbits. Compared with prior CT there is a similar appearance.  IMPRESSION: Mild atrophy and minimal small vessel disease.  No acute intracranial findings. There is no visible acute brainstem or cerebellar abnormality. Right greater than left acute and chronic maxillary sinusitis.  MRA HEAD  Findings: Widely patent carotid arteries. Basilar artery is primarily supplied by the left vertebral which is also widely patent.  There is no visible intracranial stenosis or aneurysm.  IMPRESSION: Unremarkable MR angiography intracranial circulation.  Original Report Authenticated By: Elsie Stain, M.D.   Medications: Scheduled Meds:    . aspirin  325 mg Oral Daily  . warfarin  6 mg Oral ONCE-1800  . warfarin  9 mg Oral ONCE-1800    Continuous Infusions:    . sodium chloride 75 mL/hr at 02/23/11 0222   PRN Meds:.acetaminophen, docusate sodium, oxyCODONE-acetaminophen  Assessment/Plan: 1. Unsteady gait, dizziness: Unclear etiology.? Secondary to generalized weakness after recent hernia surgery. Stroke workup is negative. Will check orthostatic blood pressures. Get PT and OT evaluation. 2. 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 Cause for above symptoms. Will treat with 5 days of Levaquin. 3. Deep vein thrombosis, status post IVC filter and therapeutically anticoagulated. 4. History of bilateral fourth and fifth finger numbness, chronic: Outpatient evaluation  Discussed at length with patient's sister Ms. Fannie Knee and her husband at the bedside and updated care. Discussed with internal medicine teaching service who indicated that patient was seen one time and they are not his primary care physician's.     HONGALGI,ANAND 02/23/2011, 12:32 PM

## 2011-02-23 NOTE — Progress Notes (Signed)
Cosign for Desma Paganini RN med admin, I/O, and notes.

## 2011-02-23 NOTE — Progress Notes (Signed)
ANTICOAGULATION CONSULT NOTE - Follow up  Pharmacy Consult for Coumadin Indication: hx of DVT  No Known Allergies  Patient Measurements: Height: 5\' 10"  (177.8 cm) Weight: 170 lb 3.2 oz (77.202 kg) IBW/kg (Calculated) : 73   Vital Signs: Temp: 98.4 F (36.9 C) (01/21 0541) Temp src: Oral (01/20 2130) BP: 129/79 mmHg (01/21 0541) Pulse Rate: 59  (01/21 0541)  Labs:  Basename 02/23/11 0500 02/22/11 0835 02/22/11 0500 02/21/11 1534  HGB -- 13.5 -- 14.1  HCT -- 40.6 -- 42.0  PLT -- 202 -- 243  APTT -- -- -- --  LABPROT 25.7* -- 22.6* 20.0*  INR 2.30* -- 1.95* 1.67*  HEPARINUNFRC -- -- -- --  CREATININE -- -- 1.12 1.02  CKTOTAL -- -- -- --  CKMB -- -- -- --  TROPONINI -- -- -- <0.30   Estimated Creatinine Clearance: 70.6 ml/min (by C-G formula based on Cr of 1.12).  Medical History: Past Medical History  Diagnosis Date  . DVT (deep venous thrombosis) 2010 and 10/2010    after Hernia repair in 2010 s/p IVC filter and coumadin. Next episode after Hernia repair  ( 10/23/2010) . Admitted on 11/02/2010 for repeat DVT. Warfarin was stopped for  5 days before surgery.   . Incisional hernia 08/20/2010  . Constipation 11/20/2010  . Headache     Medications:  Prescriptions prior to admission  Medication Sig Dispense Refill  . Multiple Vitamin (MULITIVITAMIN WITH MINERALS) TABS Take 1 tablet by mouth daily.      Marland Kitchen warfarin (COUMADIN) 6 MG tablet Take 9 mg by mouth daily.      Marland Kitchen acetaminophen (TYLENOL) 500 MG tablet Take 500 mg by mouth every 6 (six) hours as needed.        . docusate sodium (COLACE) 100 MG capsule Take 100 mg by mouth 2 (two) times daily as needed. For constipation       . oxyCODONE-acetaminophen (ROXICET) 5-325 MG per tablet Take 1 tablet by mouth every 6 (six) hours as needed for pain.  40 tablet  0  . tiZANidine (ZANAFLEX) 4 MG tablet         Assessment: 61 year old male with history of DVT (2010 -IVC filter in place, and repeat DVT in 11/02/10).  Patient was  on coumadin 9mg  daily except 6mg  on Wednesdays PTA.  INR 2.30.  Goal of Therapy:  INR 2-3   Plan:  1. Coumadin 6mg  today.  2. Follow up PT/INR in am.  Akshaj Besancon Poteet 02/23/2011,9:14 AM

## 2011-02-24 LAB — PROTIME-INR
INR: 2.78 — ABNORMAL HIGH (ref 0.00–1.49)
Prothrombin Time: 29.8 seconds — ABNORMAL HIGH (ref 11.6–15.2)

## 2011-02-24 LAB — GLUCOSE, CAPILLARY
Glucose-Capillary: 96 mg/dL (ref 70–99)
Glucose-Capillary: 152 mg/dL — ABNORMAL HIGH (ref 70–99)

## 2011-02-24 LAB — T3, FREE: T3, Free: 2.8 pg/mL (ref 2.3–4.2)

## 2011-02-24 MED ORDER — LEVOFLOXACIN 750 MG PO TABS: 750.0000 mg | ORAL_TABLET | Freq: Every day | ORAL | Status: DC

## 2011-02-24 MED ORDER — ONDANSETRON HCL 4 MG/2ML IJ SOLN
4.0000 mg | Freq: Four times a day (QID) | INTRAMUSCULAR | Status: DC | PRN
  Administered 2011-02-24: 4 mg via INTRAVENOUS
  Filled 2011-02-24: qty 2

## 2011-02-24 MED ORDER — WARFARIN SODIUM 2 MG PO TABS
2.0000 mg | ORAL_TABLET | Freq: Once | ORAL | Status: AC
  Administered 2011-02-24: 2 mg via ORAL
  Filled 2011-02-24: qty 1

## 2011-02-24 MED ORDER — WARFARIN SODIUM 6 MG PO TABS: 6.0000 mg | ORAL_TABLET | Freq: Every day | ORAL | Status: DC

## 2011-02-24 NOTE — Progress Notes (Signed)
Subjective: Overall patient says he's feeling better. He complains of some dizziness and unsteadiness when he tries to sit at the edge of the bed and get up initially. This is however improved compared to that on admission. Still awaiting physical therapy to evaluate.  Objective: Blood pressure 111/81, pulse 76, temperature 98.1 F (36.7 C), temperature source Oral, resp. rate 20, height 5\' 10"  (1.778 m), weight 76.8 kg (169 lb 5 oz), SpO2 100.00%.  Intake/Output Summary (Last 24 hours) at 02/24/11 1459 Last data filed at 02/24/11 1300  Gross per 24 hour  Intake    360 ml  Output   1200 ml  Net   -840 ml    General Exam: Comfortable.  Respiratory System: Clear. No increased work of breathing. Cardiovascular System: First and second heart sounds heard. Regular rate and rhythm. No JVD/murmurs. Telemetry shows sinus bradycardia in the 40s to sinus rhythm in the 60s. Orthostatic blood pressures are negative. Gastrointestinal System: Abdomen is non distended, soft and normal bowel sounds heard. Central Nervous System: Alert and oriented. No focal neurological deficits. Extremities: Grade 5 x 5 power symmetrically. No pronator drift.  Lab Results: Basic Metabolic Panel:  Basename 02/22/11 0500 02/21/11 1534  NA 139 138  K 4.5 4.0  CL 106 103  CO2 25 24  GLUCOSE 84 88  BUN 18 14  CREATININE 1.12 1.02  CALCIUM 9.1 9.3  MG -- --  PHOS -- --   Liver Function Tests:  Basename 02/21/11 1534  AST 23  ALT 24  ALKPHOS 74  BILITOT 0.4  PROT 7.5  ALBUMIN 4.0   No results found for this basename: LIPASE:2,AMYLASE:2 in the last 72 hours No results found for this basename: AMMONIA:2 in the last 72 hours CBC:  Basename 02/22/11 0835 02/21/11 1534  WBC 6.8 7.6  NEUTROABS -- 5.2  HGB 13.5 14.1  HCT 40.6 42.0  MCV 89.2 88.1  PLT 202 243   Cardiac Enzymes:  Basename 02/21/11 1534  CKTOTAL --  CKMB --  CKMBINDEX --  TROPONINI <0.30   BNP: No results found for this basename:  PROBNP:3 in the last 72 hours D-Dimer: No results found for this basename: DDIMER:2 in the last 72 hours CBG:  Basename 02/24/11 1100 02/24/11 0651 02/23/11 2154 02/23/11 1613 02/23/11 1054 02/23/11 0644  GLUCAP 96 90 119* 89 121* 79   Hemoglobin A1C:  Basename 02/21/11 2154  HGBA1C 5.7*   Fasting Lipid Panel:  Basename 02/22/11 0500  CHOL 185  HDL 51  LDLCALC 118*  TRIG 80  CHOLHDL 3.6  LDLDIRECT --   Thyroid Function Tests:  Basename 02/22/11 0500  TSH 4.736*  T4TOTAL --  FREET4 --  T3FREE --  THYROIDAB --   Anemia Panel: No results found for this basename: VITAMINB12,FOLATE,FERRITIN,TIBC,IRON,RETICCTPCT in the last 72 hours Coagulation:  Basename 02/24/11 0555 02/23/11 0500  LABPROT 29.8* 25.7*  INR 2.78* 2.30*   Urine Drug Screen: Drugs of Abuse     Component Value Date/Time   LABOPIA NONE DETECTED 02/22/2011 0025   COCAINSCRNUR NONE DETECTED 02/22/2011 0025   LABBENZ NONE DETECTED 02/22/2011 0025   AMPHETMU NONE DETECTED 02/22/2011 0025   THCU NONE DETECTED 02/22/2011 0025   LABBARB NONE DETECTED 02/22/2011 0025    Alcohol Level: No results found for this basename: ETH:2 in the last 72 hours  Micro Results: Recent Results (from the past 240 hour(s))  MRSA PCR SCREENING     Status: Normal   Collection Time   02/21/11 10:57 PM  Component Value Range Status Comment   MRSA by PCR NEGATIVE  NEGATIVE  Final     Studies/Results: Dg Chest 2 View  02/21/2011  *RADIOLOGY REPORT*  Clinical Data: Cough for 5 days.  Tremors with elevated blood pressure, dizziness and weakness.  CHEST - 2 VIEW  Comparison: 11/03/2008 radiographs. Abdominal CT 01/13/2009.  Findings: There is interval improved aeration of the lung bases.  A small nodular density projecting over the left lung base on the frontal examination is most consistent with a nipple shadow.  There is no confluent airspace opacity or pleural effusion.  There is anterior wedging of one of the lower thoracic  vertebral bodies which appears new compared with the prior abdominal CT.  However, this does not have an acute appearance.  IMPRESSION:  1.  No acute cardiopulmonary process. 2.  Interval lower thoracic compression deformity does not have an acute appearance - correlate clinically. Per CMS PQRS reporting requirements (PQRS Measure 24): Given the patient's age of greater than 50 and the fracture site (hip, distal radius, or spine), the patient should be tested for osteoporosis using DXA, and the appropriate treatment considered based on the DXA results.  Original Report Authenticated By: Gerrianne Scale, M.D.   Ct Head Wo Contrast  02/21/2011  *RADIOLOGY REPORT*  Clinical Data: Altered mental status.  Sudden onset of dizziness today.  CT HEAD WITHOUT CONTRAST  Technique:  Contiguous axial images were obtained from the base of the skull through the vertex without contrast.  Comparison: None.  Findings: There is no evidence for acute infarction, intracranial hemorrhage, mass lesion, hydrocephalus, or extra-axial fluid.  Mild atrophy.  Mild chronic microvascular ischemic change.  Calvarium intact.  Chronic and acute maxillary sinus disease, worse on the right.  IMPRESSION: Mild atrophy and chronic microvascular ischemic change.  No visible acute intracranial infarction or hemorrhage. Maxillary sinus disease, worse on the right.  Original Report Authenticated By: Elsie Stain, M.D.   Mri Brain Without Contrast  02/22/2011  *RADIOLOGY REPORT*  Clinical Data:  Acute imbalance. Nausea.  Deep venous thrombosis. Acute but ill defined cerebrovascular disease.  MRI HEAD WITHOUT CONTRAST MRA HEAD WITHOUT CONTRAST  Technique:  Multiplanar, multiecho pulse sequences of the brain and surrounding structures were obtained without intravenous contrast. Angiographic images of the head were obtained using MRA technique without contrast.  Comparison:  CT head 02/21/2011  MRI HEAD  Findings:  There is no evidence for acute  infarction, intracranial hemorrhage, mass lesion, hydrocephalus, or extra-axial fluid.  Mild premature atrophy is present. Minimal chronic microvascular ischemic changes are present in the periventricular white matter. Tiny deep white matter lacune left insular subcortical white matter.  No brainstem ischemic demyelination is seen.  There are no acute or chronic cerebellar infarcts.  No foci of chronic hemorrhage are seen.  Major intracranial vascular structures are widely patent.  Pituitary and cerebellar tonsils unremarkable. Upper cervical region appears normal.  Moderate sinus disease affects the right greater than left maxillary regions, with a suspected acute on chronic component.  Right greater than left ethmoid sinus fluid is also present. No significant mastoid fluid is seen. Normal orbits. Compared with prior CT there is a similar appearance.  IMPRESSION: Mild atrophy and minimal small vessel disease.  No acute intracranial findings. There is no visible acute brainstem or cerebellar abnormality. Right greater than left acute and chronic maxillary sinusitis.  MRA HEAD  Findings: Widely patent carotid arteries. Basilar artery is primarily supplied by the left vertebral which is also widely  patent.  There is no visible intracranial stenosis or aneurysm.  IMPRESSION: Unremarkable MR angiography intracranial circulation.  Original Report Authenticated By: Elsie Stain, M.D.   Medications: Scheduled Meds:    . aspirin  325 mg Oral Daily  . levofloxacin  750 mg Oral Daily  . sodium chloride  3 mL Intravenous Q12H  . warfarin  2 mg Oral ONCE-1800  . warfarin  6 mg Oral ONCE-1800   Continuous Infusions:   PRN Meds:.acetaminophen, docusate sodium, ondansetron, oxyCODONE-acetaminophen  Assessment/Plan: 1. Unsteady gait, dizziness: Unclear etiology.? Secondary to generalized weakness after recent hernia surgery. Stroke workup is negative. Awaiting PT and OT evaluation. 2. 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 Cause for above symptoms. Day 2 of 5 of Levaquin. 3. Deep vein thrombosis, status post IVC filter and therapeutically anticoagulated. 4. History of bilateral fourth and fifth finger numbness, chronic: Outpatient evaluation 5. Sinus bradycardia, asymptomatic: Mildly elevated TSH. Rule out hypothyroidism. Repeat full thyroid function tests and addressed appropriately.  Discussed at length with patient's sister Ms. Fannie Knee at the bedside and updated care.  Disposition: Pending physical therapy evaluation.   Jordan Long 02/24/2011, 2:59 PM

## 2011-02-24 NOTE — Progress Notes (Signed)
ANTICOAGULATION CONSULT NOTE - Follow up  Pharmacy Consult for Coumadin Indication: hx of DVT  No Known Allergies  Patient Measurements: Height: 5\' 10"  (177.8 cm) Weight: 169 lb 5 oz (76.8 kg) (c scale) IBW/kg (Calculated) : 73   Vital Signs: Temp: 98 F (36.7 C) (01/22 0615) BP: 121/74 mmHg (01/22 0615) Pulse Rate: 58  (01/22 0615)  Labs:  Basename 02/24/11 0555 02/23/11 0500 02/22/11 0835 02/22/11 0500 02/21/11 1534  HGB -- -- 13.5 -- 14.1  HCT -- -- 40.6 -- 42.0  PLT -- -- 202 -- 243  APTT -- -- -- -- --  LABPROT 29.8* 25.7* -- 22.6* --  INR 2.78* 2.30* -- 1.95* --  HEPARINUNFRC -- -- -- -- --  CREATININE -- -- -- 1.12 1.02  CKTOTAL -- -- -- -- --  CKMB -- -- -- -- --  TROPONINI -- -- -- -- <0.30   Estimated Creatinine Clearance: 70.6 ml/min (by C-G formula based on Cr of 1.12).  Medical History: Past Medical History  Diagnosis Date  . DVT (deep venous thrombosis) 2010 and 10/2010    after Hernia repair in 2010 s/p IVC filter and coumadin. Next episode after Hernia repair  ( 10/23/2010) . Admitted on 11/02/2010 for repeat DVT. Warfarin was stopped for  5 days before surgery.   . Incisional hernia 08/20/2010  . Constipation 11/20/2010  . Headache     Medications:  Prescriptions prior to admission  Medication Sig Dispense Refill  . Multiple Vitamin (MULITIVITAMIN WITH MINERALS) TABS Take 1 tablet by mouth daily.      Marland Kitchen warfarin (COUMADIN) 6 MG tablet Take 9 mg by mouth daily.      Marland Kitchen acetaminophen (TYLENOL) 500 MG tablet Take 500 mg by mouth every 6 (six) hours as needed.        . docusate sodium (COLACE) 100 MG capsule Take 100 mg by mouth 2 (two) times daily as needed. For constipation       . oxyCODONE-acetaminophen (ROXICET) 5-325 MG per tablet Take 1 tablet by mouth every 6 (six) hours as needed for pain.  40 tablet  0  . tiZANidine (ZANAFLEX) 4 MG tablet         Assessment: 61 year old male with history of DVT (2010 -IVC filter in place, and repeat DVT in  11/02/10).  Patient was on coumadin 9mg  daily except 6mg  on Wednesdays PTA.  INR 2.78 today (Protime increased by ~5sec)  Goal of Therapy:  INR 2-3   Plan:  1. Coumadin 2mg  today.  2. Follow up PT/INR in am.  Audianna Landgren Poteet 02/24/2011,9:46 AM

## 2011-02-24 NOTE — Evaluation (Signed)
Physical Therapy Evaluation Patient Details Name: Jordan Long MRN: [id number] DOB: Dec 16, 1950 Today's Date: 02/24/2011  Problem List:  Patient Active Problem List  Diagnoses  . Incisional hernia  . DVT (deep venous thrombosis)  . Constipation  . Ataxia  . Numbness and tingling in hands    Past Medical History:  Past Medical History  Diagnosis Date  . DVT (deep venous thrombosis) 2010 and 10/2010    after Hernia repair in 2010 s/p IVC filter and coumadin. Next episode after Hernia repair  ( 10/23/2010) . Admitted on 11/02/2010 for repeat DVT. Warfarin was stopped for  5 days before surgery.   . Incisional hernia 08/20/2010  . Constipation 11/20/2010  . Headache    Past Surgical History:  Past Surgical History  Procedure Date  . Tonsillectomy   . Hernia repair 2010  . Hernia repair 10/23/2010    Incisional hernia w/mesh  . Hernia repair     PT Assessment/Plan/Recommendation PT Assessment Clinical Impression Statement: Pt adm with dizziness.  Pt presents to PT with mobility adequate to return home but pt not at baseline for mobility.  Recommend OPPT in a few days if pt isn't back to baseline. PT Recommendation/Assessment: All further PT needs can be met in the next venue of care PT Problem List: Decreased balance PT Therapy Diagnosis : Abnormality of gait PT Recommendation Follow Up Recommendations: Outpatient PT Equipment Recommended: None recommended by PT PT Goals     PT Evaluation Precautions/Restrictions  Precautions Required Braces or Orthoses: No Restrictions Weight Bearing Restrictions: No Prior Functioning  Home Living Lives With: Alone Receives Help From:  (says she will go and stay with his sister a few days) Type of Home: Apartment Home Layout: One level Home Access: Stairs to enter Entrance Stairs-Rails: Right;Left;Can reach both Entrance Stairs-Number of Steps: 5 Bathroom Shower/Tub: Tub/shower unit;Curtain Firefighter: Standard (vanity  beside) Bathroom Accessibility: Yes How Accessible: Accessible via walker Home Adaptive Equipment: None Prior Function Level of Independence: Independent with basic ADLs;Independent with homemaking with ambulation;Independent with gait;Independent with transfers Driving: Yes 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 The 4th and 5th have been that way for years, the 3rd one just since my last surgery Coordination Gross Motor Movements are Fluid and Coordinated: Yes Fine Motor Movements are Fluid and Coordinated: Yes Extremity Assessment RUE Assessment RUE Assessment: Within Functional Limits LUE Assessment LUE Assessment: Within Functional Limits RLE Assessment RLE Assessment: Within Functional Limits LLE Assessment LLE Assessment: Within Functional Limits Mobility (including Balance) Bed Mobility Bed Mobility: Yes Supine to Sit: 7: Independent Sitting - Scoot to Edge of Bed: 7: Independent Transfers Sit to Stand: 6: Modified independent (Device/Increase time);With upper extremity assist;From chair/3-in-1;With armrests Stand to Sit: 6: Modified independent (Device/Increase time);With upper extremity assist;With armrests;To chair/3-in-1 Ambulation/Gait Ambulation/Gait Assistance: 6: Modified independent (Device/Increase time) (incr time.  Hesistancy with pace.) Ambulation Distance (Feet): 400 Feet Assistive device: None Gait Pattern: Decreased step length - right (Pt reports old problem with rt. leg) Gait velocity: 2.39 ft/sec Stairs Assistance: 6: Modified independent (Device/Increase time) Stair Management Technique: One rail Left;Alternating  pattern Number of Stairs: 5  Height of Stairs: 6   Dynamic Gait Index Level Surface: Mild Impairment Change in Gait Speed: Mild Impairment Gait with Horizontal Head Turns: Mild  Impairment Gait with Vertical Head Turns: Mild Impairment Gait and Pivot Turn: Mild Impairment Step Over Obstacle: Mild Impairment Step Around Obstacles: Mild Impairment Steps: Mild Impairment Total Score: 16  Exercise    End of Session PT - End of Session Activity Tolerance: Patient tolerated treatment well Patient left: in chair Nurse Communication: Mobility status for ambulation General Behavior During Session: [facility] for tasks performed Cognition: [facility] for tasks performed  [facility] 02/24/2011, 3:56 PM  [facility] PT [phone]

## 2011-02-24 NOTE — Progress Notes (Signed)
Cosign for Emily Strickland RN assessment, med admin, notes, care plan/education, and I/O  

## 2011-02-24 NOTE — Discharge Summary (Signed)
Discharge Summary  Jordan Long MR#: [id number]  DOB:[birth year]  Date of Admission: 02/21/2011 Date of Discharge: 02/24/2011  Patient's PCP: Almyra Deforest, MD, MD  Attending Physician:Abdishakur Gottschall  Consults: None  Discharge Diagnoses: 1. Unsteady gait, dizziness of unclear etiology. 2. 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 Sinus bradycardia. Rule out hypothyroidism    Brief Admitting History and Physical 61 year old Caucasian male with history of deep vein thrombosis? Recurrent, status post IVC filter who is on Coumadin for a couple of years, status post hernia repair on 10/23/2010, presented with unsteadiness, generalized weakness, blurred vision and tingling in both hands. He has chronic numbness of his fourth and fifth fingers bilaterally for a couple of years. He denies neck pain or weakness in his upper limbs. He was admitted for further evaluation and management.  Discharge Medications Current Discharge Medication List    START taking these medications   Details  levofloxacin (LEVAQUIN) 750 MG tablet Take 1 tablet (750 mg total) by mouth daily. Qty: 3 tablet, Refills: 0      CONTINUE these medications which have CHANGED   Details  warfarin (COUMADIN) 6 MG tablet Take 1 tablet (6 mg total) by mouth daily.      CONTINUE these medications which have NOT CHANGED   Details  Multiple Vitamin (MULITIVITAMIN WITH MINERALS) TABS Take 1 tablet by mouth daily.    acetaminophen (TYLENOL) 500 MG tablet Take 500 mg by mouth every 6 (six) hours as needed.     Associated Diagnoses: DVT (deep venous thrombosis); Incisional hernia    docusate sodium (COLACE) 100 MG capsule Take 100 mg by mouth 2 (two) times daily as needed. For constipation    Associated Diagnoses: Constipation    oxyCODONE-acetaminophen (ROXICET) 5-325 MG per tablet Take 1 tablet by mouth every 6 (six) hours as needed for  pain. Qty: 40 tablet, Refills: 0      STOP taking these medications     tiZANidine (ZANAFLEX) 4 MG tablet Comments:  Reason for Stopping:          Hospital Course: 1. Unsteady gait and dizziness of unclear etiology. Initially patient was admitted to rule out stroke. Stroke workup was negative. He did not have orthostatic blood pressure changes. He indicates that he has been generally weak since his hernia surgery late last year. Physical therapy and occupational therapy evaluated him and recommended outpatient physical therapy. He lives alone. He indicates that he's feeling stronger and less unsteady. He is mildly dizzy when he first tries to sit up at the edge of the bed. He is advised to perform these activities gradually. He will be staying with his sister until he recuperates. Patient denied any tinnitus or vertigo. 2. 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 3. Deep vein thrombosis? Recurrent: Patient's INR was subtherapeutic on admission. He claims compliance to Coumadin. His Coumadin levels are monitored at Dr.Tilley's office. He is advised to followup in the next 2-3 days with repeat PT/INR for any necessary Coumadin dose adjustments. 4. History of bilateral fourth and fifth finger numbness, chronic: Recommend outpatient evaluation as deemed necessary. 5. Sinus bradycardia, asymptomatic. Patient has mildly elevated TSH. We'll repeat full thyroid function tests and addressed appropriately.    Day of Discharge BP 124/76  Pulse 56  Temp(Src) 98.1 F (36.7 C) (Oral)  Resp 20  Ht 5\' 10"  (1.778 m)  Wt 76.8 kg (169 lb 5  oz)  BMI 24.29 kg/m2  SpO2 100%   General Exam: Comfortable.  Respiratory System: Clear. No increased work of breathing.  Cardiovascular System: First and second heart sounds heard. Regular rate and rhythm. No JVD/murmurs. Telemetry shows sinus bradycardia in the 40s to sinus rhythm in the 60s. Orthostatic  blood pressures are negative.  Gastrointestinal System: Abdomen is non distended, soft and normal bowel sounds heard.  Central Nervous System: Alert and oriented. No focal neurological deficits.  Extremities: Grade 5 x 5 power symmetrically. No pronator drift.  Results for orders placed during the hospital encounter of 02/21/11 (from the past 48 hour(s))  GLUCOSE, CAPILLARY     Status: Normal   Collection Time   02/22/11  9:46 PM      Component Value Range Comment   Glucose-Capillary 86  70 - 99 (mg/dL)    Comment 1 Documented in Chart      Comment 2 Notify RN     PROTIME-INR     Status: Abnormal   Collection Time   02/23/11  5:00 AM      Component Value Range Comment   Prothrombin Time 25.7 (*) 11.6 - 15.2 (seconds)    INR 2.30 (*) 0.00 - 1.49    GLUCOSE, CAPILLARY     Status: Normal   Collection Time   02/23/11  6:44 AM      Component Value Range Comment   Glucose-Capillary 79  70 - 99 (mg/dL)   GLUCOSE, CAPILLARY     Status: Abnormal   Collection Time   02/23/11 10:54 AM      Component Value Range Comment   Glucose-Capillary 121 (*) 70 - 99 (mg/dL)    Comment 1 Notify RN      Comment 2 Documented in Chart     GLUCOSE, CAPILLARY     Status: Normal   Collection Time   02/23/11  4:13 PM      Component Value Range Comment   Glucose-Capillary 89  70 - 99 (mg/dL)   GLUCOSE, CAPILLARY     Status: Abnormal   Collection Time   02/23/11  9:54 PM      Component Value Range Comment   Glucose-Capillary 119 (*) 70 - 99 (mg/dL)   PROTIME-INR     Status: Abnormal   Collection Time   02/24/11  5:55 AM      Component Value Range Comment   Prothrombin Time 29.8 (*) 11.6 - 15.2 (seconds)    INR 2.78 (*) 0.00 - 1.49    GLUCOSE, CAPILLARY     Status: Normal   Collection Time   02/24/11  6:51 AM      Component Value Range Comment   Glucose-Capillary 90  70 - 99 (mg/dL)   GLUCOSE, CAPILLARY     Status: Normal   Collection Time   02/24/11 11:00 AM      Component Value Range Comment    Glucose-Capillary 96  70 - 99 (mg/dL)    Comment 1 Notify RN     GLUCOSE, CAPILLARY     Status: Abnormal   Collection Time   02/24/11  4:40 PM      Component Value Range Comment   Glucose-Capillary 152 (*) 70 - 99 (mg/dL)    Lab data 1. CBC is range from 89-1 52 mg/dL. 2. BMP was unremarkable with BUN 18 and creatinine 1.12. 3. LFTs were within normal limits. 4. Lipid panel was significant for LDL of 118. 5. CBC within normal limits 6. Hemoglobin A1c 5.7 7.  TSH 4.736 8. Urine drug screen negative 9. MRSA by PCR negative   Dg Chest 2 View  02/21/2011  *RADIOLOGY REPORT*  Clinical Data: Cough for 5 days.  Tremors with elevated blood pressure, dizziness and weakness.  CHEST - 2 VIEW  Comparison: 11/03/2008 radiographs. Abdominal CT 01/13/2009.  Findings: There is interval improved aeration of the lung bases.  A small nodular density projecting over the left lung base on the frontal examination is most consistent with a nipple shadow.  There is no confluent airspace opacity or pleural effusion.  There is anterior wedging of one of the lower thoracic vertebral bodies which appears new compared with the prior abdominal CT.  However, this does not have an acute appearance.  IMPRESSION:  1.  No acute cardiopulmonary process. 2.  Interval lower thoracic compression deformity does not have an acute appearance - correlate clinically. Per CMS PQRS reporting requirements (PQRS Measure 24): Given the patient's age of greater than 50 and the fracture site (hip, distal radius, or spine), the patient should be tested for osteoporosis using DXA, and the appropriate treatment considered based on the DXA results.  Original Report Authenticated By: Gerrianne Scale, M.D.   Ct Head Wo Contrast  02/21/2011  *RADIOLOGY REPORT*  Clinical Data: Altered mental status.  Sudden onset of dizziness today.  CT HEAD WITHOUT CONTRAST  Technique:  Contiguous axial images were obtained from the base of the skull through the  vertex without contrast.  Comparison: None.  Findings: There is no evidence for acute infarction, intracranial hemorrhage, mass lesion, hydrocephalus, or extra-axial fluid.  Mild atrophy.  Mild chronic microvascular ischemic change.  Calvarium intact.  Chronic and acute maxillary sinus disease, worse on the right.  IMPRESSION: Mild atrophy and chronic microvascular ischemic change.  No visible acute intracranial infarction or hemorrhage. Maxillary sinus disease, worse on the right.  Original Report Authenticated By: Elsie Stain, M.D.   Mri Brain Without Contrast  02/22/2011  *RADIOLOGY REPORT*  Clinical Data:  Acute imbalance. Nausea.  Deep venous thrombosis. Acute but ill defined cerebrovascular disease.  MRI HEAD WITHOUT CONTRAST MRA HEAD WITHOUT CONTRAST  Technique:  Multiplanar, multiecho pulse sequences of the brain and surrounding structures were obtained without intravenous contrast. Angiographic images of the head were obtained using MRA technique without contrast.  Comparison:  CT head 02/21/2011  MRI HEAD  Findings:  There is no evidence for acute infarction, intracranial hemorrhage, mass lesion, hydrocephalus, or extra-axial fluid.  Mild premature atrophy is present. Minimal chronic microvascular ischemic changes are present in the periventricular white matter. Tiny deep white matter lacune left insular subcortical white matter.  No brainstem ischemic demyelination is seen.  There are no acute or chronic cerebellar infarcts.  No foci of chronic hemorrhage are seen.  Major intracranial vascular structures are widely patent.  Pituitary and cerebellar tonsils unremarkable. Upper cervical region appears normal.  Moderate sinus disease affects the right greater than left maxillary regions, with a suspected acute on chronic component.  Right greater than left ethmoid sinus fluid is also present. No significant mastoid fluid is seen. Normal orbits. Compared with prior CT there is a similar appearance.   IMPRESSION: Mild atrophy and minimal small vessel disease.  No acute intracranial findings. There is no visible acute brainstem or cerebellar abnormality. Right greater than left acute and chronic maxillary sinusitis.  MRA HEAD  Findings: Widely patent carotid arteries. Basilar artery is primarily supplied by the left vertebral which is also widely patent.  There is no visible  intracranial stenosis or aneurysm.  IMPRESSION: Unremarkable MR angiography intracranial circulation.  Original Report Authenticated By: Elsie Stain, M.D.    2-D echocardiogram: Study Conclusions: - Left ventricle: The cavity size was normal. Systolic function was normal. The estimated ejection fraction was in the range of 55% to 60%. Wall motion was normal; there were no regional wall motion abnormalities.- Left atrium: The atrium was mildly dilated.  Carotid Dopplers:Summary: No significant extracranial carotid artery stenosis demonstrated. Vertebrals are patent with antegrade flow.      Disposition:  discharged home in stable condition   Diet: Heart healthy   Activity:  increase activity gradually   Follow-up Appts: Discharge Orders    Future Appointments: Provider: Department: Dept Phone: Center:   03/02/2011 8:15 AM Almyra Deforest, MD [facility] [phone] [facility]   04/22/2011 9:05 AM Liz Malady, MD [facility] [phone] None     Future Orders Please Complete By Expires   Diet - low sodium heart healthy      Increase activity slowly      Call MD for:  persistant dizziness or light-headedness         TESTS THAT NEED FOLLOW-UP  PT and INR in the next 2-3 days from hospital discharge   Time spent on discharge, talking to the patient, and coordinating care:  35 mins.   SignedMarcellus Scott, MD 02/24/2011, 5:56 PM

## 2011-02-24 NOTE — Progress Notes (Signed)
IV d/c'ed. Tele d/c'ed. D/c instructions and medications discussed with pt and pt's sister. All questions answered. Pt and sister state understanding. Pt given script for outpt PT and missed days at work. Pt does not appear in any immediate distress. Pt escorted out via w/c with staff. Pt d/c'ed to home.

## 2011-02-24 NOTE — Evaluation (Signed)
Occupational Therapy Evaluation Patient Details Name: Jordan Long MRN: [id number] DOB: 1951-01-01 Today's Date: 02/24/2011 2:41-3:11  Problem List:  Patient Active Problem List  Diagnoses  . Incisional hernia  . DVT (deep venous thrombosis)  . Constipation  . Ataxia  . Numbness and tingling in hands    Past Medical History:  Past Medical History  Diagnosis Date  . DVT (deep venous thrombosis) 2010 and 10/2010    after Hernia repair in 2010 s/p IVC filter and coumadin. Next episode after Hernia repair  ( 10/23/2010) . Admitted on 11/02/2010 for repeat DVT. Warfarin was stopped for  5 days before surgery.   . Incisional hernia 08/20/2010  . Constipation 11/20/2010  . Headache    Past Surgical History:  Past Surgical History  Procedure Date  . Tonsillectomy   . Hernia repair 2010  . Hernia repair 10/23/2010    Incisional hernia w/mesh  . Hernia repair     OT Assessment/Plan/Recommendation OT Assessment Clinical Impression Statement: This 61 yo male admitted c/o LOB presents to acute OT at a Mod I/I level. No further OT needs, will sign off. Pt states he will go and stay with his sister for a few days. OT Recommendation/Assessment: Patient does not need any further OT services OT Recommendation Follow Up Recommendations: No OT follow up Equipment Recommended:  (tub seat if he feels his sisters helps him) OT Goals    OT Evaluation Precautions/Restrictions  Precautions Required Braces or Orthoses: No Restrictions Weight Bearing Restrictions: No Prior Functioning Home Living Lives With: Alone Receives Help From:  (says she will go and stay with his sister a few days) Type of Home: Apartment Home Layout: One level Home Access: Stairs to enter Entrance Stairs-Rails: Right;Left;Can reach both Entrance Stairs-Number of Steps: 5 Bathroom Shower/Tub: Tub/shower unit;Curtain Firefighter: Standard (vanity beside) Bathroom Accessibility: Yes How Accessible:  Accessible via walker Home Adaptive Equipment: None Prior Function Level of Independence: Independent with basic ADLs;Independent with homemaking with ambulation;Independent with gait;Independent with transfers Driving: Yes 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) ADL ADL Eating/Feeding: Simulated;Independent Where Assessed - Eating/Feeding: Chair Grooming: Simulated;Modified independent (increased time) Where Assessed - Grooming: Standing at sink Upper Body Bathing: Simulated;Modified independent (increased time) Where Assessed - Upper Body Bathing: Unsupported;Sit to stand from bed Lower Body Bathing: Simulated;Modified independent (increased time) Where Assessed - Lower Body Bathing: Sit to stand from bed;Unsupported Upper Body Dressing: Simulated;Modified independent (increased time) Where Assessed - Upper Body Dressing: Sitting, bed;Unsupported Lower Body Dressing: Simulated;Modified independent (increased time) Where Assessed - Lower Body Dressing: Unsupported;Sit to stand from bed Toilet Transfer: Simulated;Modified independent;Other (comment) (increased time, bed out into hallway back to chair) Toilet Transfer Method: Ambulating Toileting - Clothing Manipulation: Simulated;Independent Toileting - Hygiene: Simulated;Independent Tub/Shower Transfer: Not assessed Tub/Shower Transfer Method: Not assessed Equipment Used:  (none) Ambulation Related to ADLs: Mod I---increased time secondary to feels woozy ADL Comments: increased time secondary to feels woozy. No vestibular symptoms seen with rolling, supine to sit, sit to stand, quick head turns Vision/Perception  Vision - History Baseline Vision: Wears glasses all the time Patient Visual Report: No change from baseline Cognition Cognition Arousal/Alertness: Awake/alert Overall Cognitive Status: Appears within functional limits for tasks assessed Orientation Level: Oriented  X4 Sensation/Coordination Sensation Additional Comments: "feels like my 3-5th digits both hands are asleep--tingling like they are trying to wake up. The 4th and 5th have been that way for years, the 3rd one just since my last surgery Coordination Gross Motor Movements are  Fluid and Coordinated: Yes Fine Motor Movements are Fluid and Coordinated: Yes Extremity Assessment RUE Assessment RUE Assessment: Within Functional Limits LUE Assessment LUE Assessment: Within Functional Limits Mobility  Bed Mobility Bed Mobility: Yes Supine to Sit: 7: Independent Sitting - Scoot to Edge of Bed: 7: Independent Transfers Transfers: Yes Sit to Stand: 6: Modified independent (Device/Increase time);With upper extremity assist;From bed Stand to Sit: 6: Modified independent (Device/Increase time);With upper extremity assist;With armrests;To chair/3-in-1 Exercises   End of Session OT - End of Session Equipment Utilized During Treatment: Gait belt Activity Tolerance: Patient tolerated treatment well Patient left: in chair;with call bell in reach;Other (comment) (phone within reach) General Behavior During Session: [facility] for tasks performed Cognition: [facility] for tasks performed   Evette Georges [phone] 02/24/2011, 3:28 PM

## 2011-02-25 NOTE — Progress Notes (Signed)
   CARE MANAGEMENT NOTE 02/25/2011  Patient:  Jordan Long, Jordan Long   Account Number:  [id number]  Date Initiated:  02/25/2011  Documentation initiated by:  [facility]  Subjective/Objective Assessment:   Admitted with ataxia. Lives alone but will be staying with sister after discharge.     Action/Plan:   PT eval and OT eval-PT recommending outpatient PT-given info on outpt rehab by PT   OT-no d/c needs   Anticipated DC Date:  02/24/2011   Anticipated DC Plan:  HOME/SELF CARE      DC Planning Services  CM consult      Choice offered to / List presented to:             Status of service:  Completed, signed off Medicare Important Message given?   (If response is "NO", the following Medicare IM given date fields will be blank) Date Medicare IM given:   Date Additional Medicare IM given:    Discharge Disposition:  HOME/SELF CARE  Per UR Regulation:  Reviewed for med. necessity/level of care/duration of stay  Comments:  PCP Dr. Almyra Deforest  02/25/11 Spoke with patient and his sister about d/c plans. Patient will be staying with his sister and brother in law after discharge. He has appt with Dr. Loistine Chance on 03/02/11 at 8:15. Called and confirmed appt, placed appt in EPIC. Jacquelynn Cree RN, BSN, CCM

## 2011-02-26 ENCOUNTER — Encounter: Payer: Managed Care, Other (non HMO) | Admitting: Internal Medicine

## 2011-03-02 ENCOUNTER — Encounter: Payer: Self-pay | Admitting: Internal Medicine

## 2011-03-02 ENCOUNTER — Ambulatory Visit (INDEPENDENT_AMBULATORY_CARE_PROVIDER_SITE_OTHER): Payer: Managed Care, Other (non HMO) | Admitting: Internal Medicine

## 2011-03-02 VITALS — BP 124/80 | HR 63 | Temp 97.6°F | Ht 70.0 in | Wt 170.6 lb

## 2011-03-02 DIAGNOSIS — R279 Unspecified lack of coordination: Secondary | ICD-10-CM

## 2011-03-02 DIAGNOSIS — R270 Ataxia, unspecified: Secondary | ICD-10-CM

## 2011-03-02 DIAGNOSIS — F419 Anxiety disorder, unspecified: Secondary | ICD-10-CM

## 2011-03-02 DIAGNOSIS — Z23 Encounter for immunization: Secondary | ICD-10-CM

## 2011-03-02 DIAGNOSIS — K5900 Constipation, unspecified: Secondary | ICD-10-CM

## 2011-03-02 DIAGNOSIS — R42 Dizziness and giddiness: Secondary | ICD-10-CM

## 2011-03-02 DIAGNOSIS — R209 Unspecified disturbances of skin sensation: Secondary | ICD-10-CM

## 2011-03-02 DIAGNOSIS — F411 Generalized anxiety disorder: Secondary | ICD-10-CM | POA: Insufficient documentation

## 2011-03-02 DIAGNOSIS — R200 Anesthesia of skin: Secondary | ICD-10-CM

## 2011-03-02 LAB — GLUCOSE, CAPILLARY: Glucose-Capillary: 83 mg/dL (ref 70–99)

## 2011-03-02 MED ORDER — PAROXETINE HCL 10 MG PO TABS: 10.0000 mg | ORAL_TABLET | ORAL | Status: DC

## 2011-03-02 NOTE — Assessment & Plan Note (Signed)
Improved. Currently not taking any medication.

## 2011-03-02 NOTE — Progress Notes (Signed)
Subjective:   Patient ID: Jordan Long male   DOB: [birth year] 61 y.o.   MRN: [id number]  HPI: [name] is a 61 y.o. male with past medical history significant as outlined below who presented to the emergency room on January 19th 2000 2013 with unsteady gait, generalized weakness, blurred vision and tingling in his hands. He has chronic numbness of his fourth and fifth finger bilaterally since 2010 after he was admitted to the ICU. He developed sepsis after hernia repair.  Evaluation concerning patient's instability gait was negative including CT head without contrast,  MRI of brain without contrast, MR angiogram , 2-D echo, carotid Dopplers, thyroid function. The patient was discharged on January 22. Patient was recommended to have physical therapy as an outpatient which he has not performed. The patient furthermore was noted to have acute on chronic left maxilla sinuses and was treated with a 5 day course of Levaquin which he has completed.  Patient noted he continues to have some instability in gait and dizziness when getting up fast. Acetic blood pressures were performed during hospitalization and were negative. Patient denies any syncope. Patient reports it all started couple of days prior to admission on the 19th of January. Denies any changes in medication, changes in environment, recent travel. Patient's sister is present during this office visit and noted that patient is experiencing significant anxiety. He was not able to work since September of 2012 status post hernia repair due to abdominal pain and now due to gait instability. He has been normally exercising on a regular the bases which is not able to do right now. Furthermore he has not received his disability check since 3 weeks.    Past Medical History  Diagnosis Date  . DVT (deep venous thrombosis) 2010 and 10/2010    after Hernia repair in 2010 s/p IVC filter and coumadin. Next episode after Hernia repair  (  10/23/2010) . Admitted on 11/02/2010 for repeat DVT. Warfarin was stopped for  5 days before surgery.   . Incisional hernia 08/20/2010  . Constipation 11/20/2010  . Headache    Current Outpatient Prescriptions  Medication Sig Dispense Refill  . acetaminophen (TYLENOL) 500 MG tablet Take 500 mg by mouth every 6 (six) hours as needed.        . Multiple Vitamin (MULITIVITAMIN WITH MINERALS) TABS Take 1 tablet by mouth daily.      Marland Kitchen PARoxetine (PAXIL) 10 MG tablet Take 1 tablet (10 mg total) by mouth every morning.  30 tablet  0  . warfarin (COUMADIN) 6 MG tablet Take 1 tablet (6 mg total) by mouth daily.       Family History  Problem Relation Age of Onset  . Cancer Father     Lymphoma   . Raynaud syndrome Father   . Diabetes Mother   . Parkinsonism Mother   . Thyroid disease Sister   . Diabetes Sister   . Raynaud syndrome Sister   . Lupus Sister   . Raynaud syndrome Sister    History   Social History  . Marital Status: Divorced    Spouse Name: N/A    Number of Children: N/A  . Years of Education: N/A   Social History Main Topics  . Smoking status: Former Smoker    Quit date: 09/23/1985  . Smokeless tobacco: Never Used  . Alcohol Use: Yes     occasional only. Beer.   . Drug Use: No  . Sexually Active: Not Currently   Other Topics  Concern  . None   Social History Narrative  . None   Review of Systems: Constitutional: Denies fever, chills, diaphoresis, appetite change and but noted fatigue.  HEENT: Denies photophobia, eye pain, redness, hearing loss, ear pain, congestion, sore throat, rhinorrhea, sneezing, mouth sores, trouble swallowing, neck pain, neck stiffness and tinnitus.   Respiratory: Denies SOB, DOE, cough, chest tightness,  and wheezing.   Cardiovascular: Denies chest pain, palpitations and leg swelling.  Gastrointestinal: Denies nausea, vomiting, abdominal pain, diarrhea, constipation, blood in stool and abdominal distention.  Genitourinary: Denies dysuria,  urgency, frequency, hematuria, flank pain and difficulty urinating.  Musculoskeletal: Denies myalgias, back pain, joint swelling, arthralgias and gait problem.  Skin: Denies pallor, rash and wound.  Neurological: Noted light-headedness,  mild headache and  numbness in his hands but denies seizures, syncope, weakness. Psychiatric/Behavioral: Denies suicidal ideation, mood changes, confusion but noted nervousness  Objective:  Physical Exam: Filed Vitals:   03/02/11 0830  BP: 124/80  Pulse: 63  Temp: 97.6 F (36.4 C)  TempSrc: Oral  Height: 5\' 10"  (1.778 m)  Weight: 170 lb 9.6 oz (77.384 kg)  SpO2: 100%   Constitutional: Vital signs reviewed.  Patient is a well-developed and well-nourished  in no acute distress and cooperative with exam. Alert and oriented x3.  Eyes: PERRL, EOMI, conjunctivae normal, No scleral icterus.  Neck: Supple, Trachea midline normal ROM, No mass. Cardiovascular: RRR, S1 normal, S2 normal,pulses symmetric and intact bilaterally Pulmonary/Chest: CTAB, no wheezes, rales, or rhonchi Abdominal: Soft. Non-tender, non-distended, bowel sounds are normal, GU: no CVA tenderness Musculoskeletal: No joint deformities, erythema, or stiffness, ROM full and no nontender Hematology: no cervical adenopathy.  Neurological: A&O x3, Strenght is normal and symmetric bilaterally, cranial nerve II-XII are grossly intact, no focal motor deficit, sensory intact to light touch bilaterally.  Skin: Warm, dry and intact. No rash, cyanosis, or clubbing.  Psychiatric: Normal mood and affect. speech and behavior is normal. Judgment and thought content normal

## 2011-03-02 NOTE — Assessment & Plan Note (Addendum)
Patient had extensive workup during hospitalization. Unclear etiology at this point. Concern for possible proprioceptive disorder. Physical exam was unremarkable nevertheless I will obtain vitamin B12 level today. Furthermore I refer patient for physical therapy.

## 2011-03-02 NOTE — Assessment & Plan Note (Signed)
DTaP given today 

## 2011-03-02 NOTE — Patient Instructions (Signed)
When sit down and trying to get up please make sure to take a few sec before starting walking.  CT head, MRI head, Ultrasound of the heart, Thyroid function was all normal.

## 2011-03-02 NOTE — Assessment & Plan Note (Addendum)
Patient reports this has been chronic since 2010 and he has learned to adjust with it. No history of diabetes and hemoglobin A1c within normal limits. I recommended gabapentin which she refused at this point. May consider in the future.

## 2011-03-02 NOTE — Assessment & Plan Note (Signed)
Noted to have anxiety. Will give a trial with Paxil at the same time studies have shown that cognitive behavioral therapy. I provided information about the family practice of the moment to assist with this.

## 2011-03-02 NOTE — Assessment & Plan Note (Signed)
Unclear etiology. Can consider autonomic dysfunction. At this point only conservative management is recommended. I advised the patient to stand up slowly whenever he gets up from a sitting position.

## 2011-03-02 NOTE — Assessment & Plan Note (Signed)
Patient would like to wait until he has physical therapy.

## 2011-03-04 ENCOUNTER — Encounter: Payer: Self-pay | Admitting: General Surgery

## 2011-03-16 ENCOUNTER — Encounter: Payer: Self-pay | Admitting: Internal Medicine

## 2011-03-18 ENCOUNTER — Encounter (INDEPENDENT_AMBULATORY_CARE_PROVIDER_SITE_OTHER): Payer: Self-pay | Admitting: General Surgery

## 2011-03-18 ENCOUNTER — Ambulatory Visit (INDEPENDENT_AMBULATORY_CARE_PROVIDER_SITE_OTHER): Payer: Managed Care, Other (non HMO) | Admitting: General Surgery

## 2011-03-18 VITALS — BP 120/82 | HR 72 | Temp 98.4°F | Resp 18 | Wt 173.0 lb

## 2011-03-18 DIAGNOSIS — Z9889 Other specified postprocedural states: Secondary | ICD-10-CM

## 2011-03-18 DIAGNOSIS — Z8719 Personal history of other diseases of the digestive system: Secondary | ICD-10-CM

## 2011-03-18 MED ORDER — OXYCODONE-ACETAMINOPHEN 5-325 MG PO TABS: 1.0000 | ORAL_TABLET | Freq: Four times a day (QID) | ORAL | Status: DC | PRN

## 2011-03-18 NOTE — Progress Notes (Signed)
Subjective:     Patient ID: Jordan Long, male   DOB: 1950/05/21, 61 y.o.   MRN: [id number]  HPI  Patient is here for followup with ongoing recovery status post incisional hernia repair with mesh and component separation. He was hospitalized couple weeks ago for dizziness. He was worked up for stroke and nothing was reportedly found. The dizziness has improved. His abdominal wall is still very sore but this is gradually improving. He is eating fine and moving his bowels without difficulty. He still unable to do any heavy activities and does not have very much stamina. Review of Systems     Objective:   Physical Exam  HENT:  Head: Normocephalic and atraumatic.  Neck: Normal range of motion. Neck supple.  Cardiovascular: Normal rate, normal heart sounds and intact distal pulses.   Pulmonary/Chest: Effort normal and breath sounds normal.   Abdomen is soft and nondistended. His hernia repair is intact. There is mild muscular tenderness bilaterally. No evidence of infection is present.    Assessment:     Status post incisional hernia repair with mesh and component separation. Patient remains significantly limited in his activity tolerance and is unable to return to work at this time. He was also recently hospitalized for a neurologic workup.    Plan:     I gave him a refill of his roxicet. He will gradually increase his activities as tolerated. He is not ready to return to work yet. I would see him back next month. Hopefully he will continue to gradually improve. He will also be following up with his primary care physician.

## 2011-03-24 ENCOUNTER — Ambulatory Visit
Payer: Managed Care, Other (non HMO) | Attending: Internal Medicine | Admitting: Rehabilitative and Restorative Service Providers"

## 2011-03-24 DIAGNOSIS — R269 Unspecified abnormalities of gait and mobility: Secondary | ICD-10-CM | POA: Insufficient documentation

## 2011-03-24 DIAGNOSIS — IMO0001 Reserved for inherently not codable concepts without codable children: Secondary | ICD-10-CM | POA: Insufficient documentation

## 2011-03-28 ENCOUNTER — Other Ambulatory Visit: Payer: Self-pay | Admitting: Internal Medicine

## 2011-03-31 ENCOUNTER — Ambulatory Visit: Payer: Managed Care, Other (non HMO) | Admitting: Rehabilitative and Restorative Service Providers"

## 2011-04-03 ENCOUNTER — Ambulatory Visit
Payer: Managed Care, Other (non HMO) | Attending: Internal Medicine | Admitting: Rehabilitative and Restorative Service Providers"

## 2011-04-03 DIAGNOSIS — IMO0001 Reserved for inherently not codable concepts without codable children: Secondary | ICD-10-CM | POA: Insufficient documentation

## 2011-04-03 DIAGNOSIS — R269 Unspecified abnormalities of gait and mobility: Secondary | ICD-10-CM | POA: Insufficient documentation

## 2011-04-07 ENCOUNTER — Ambulatory Visit: Payer: Managed Care, Other (non HMO) | Admitting: Rehabilitative and Restorative Service Providers"

## 2011-04-10 ENCOUNTER — Ambulatory Visit: Payer: Managed Care, Other (non HMO) | Admitting: Rehabilitative and Restorative Service Providers"

## 2011-04-14 ENCOUNTER — Ambulatory Visit: Payer: Managed Care, Other (non HMO) | Admitting: Rehabilitative and Restorative Service Providers"

## 2011-04-14 ENCOUNTER — Ambulatory Visit (INDEPENDENT_AMBULATORY_CARE_PROVIDER_SITE_OTHER): Payer: Managed Care, Other (non HMO) | Admitting: Internal Medicine

## 2011-04-14 ENCOUNTER — Encounter: Payer: Self-pay | Admitting: Internal Medicine

## 2011-04-14 DIAGNOSIS — R42 Dizziness and giddiness: Secondary | ICD-10-CM

## 2011-04-14 DIAGNOSIS — N189 Chronic kidney disease, unspecified: Secondary | ICD-10-CM

## 2011-04-14 DIAGNOSIS — Z7901 Long term (current) use of anticoagulants: Secondary | ICD-10-CM

## 2011-04-14 DIAGNOSIS — I82409 Acute embolism and thrombosis of unspecified deep veins of unspecified lower extremity: Secondary | ICD-10-CM

## 2011-04-14 LAB — BASIC METABOLIC PANEL
BUN: 27 mg/dL — ABNORMAL HIGH (ref 6–23)
CO2: 26 mEq/L (ref 19–32)
Chloride: 104 mEq/L (ref 96–112)
Creat: 1.11 mg/dL (ref 0.50–1.35)
Potassium: 4.6 mEq/L (ref 3.5–5.3)

## 2011-04-14 LAB — CBC
HCT: 43 % (ref 39.0–52.0)
Hemoglobin: 14.1 g/dL (ref 13.0–17.0)
MCV: 91.7 fL (ref 78.0–100.0)
RBC: 4.69 MIL/uL (ref 4.22–5.81)
WBC: 6.1 10*3/uL (ref 4.0–10.5)

## 2011-04-14 NOTE — Assessment & Plan Note (Addendum)
Patient has had 2 episodes in the past both provoked as they occurred after hernia repair. First episode in 2010 seconds as in 2012, he is managed by Dr. Donnie Aho for this with regular warfarin adjustment INR checks. Upon next visit with primary care physician reassess the need for long-term warfarin in this patient. Will check CBC today in clinic

## 2011-04-14 NOTE — Assessment & Plan Note (Addendum)
Orthostatic vital signs were normal today, also note the patient has had a thorough workup including echo, Dopplers, and head imaging which were all normal. Continue physical therapy. No medical therapy is indicated at this point

## 2011-04-14 NOTE — Progress Notes (Signed)
Subjective:     Patient ID: Jordan Long, male   DOB: [birth year], 61 y.o.   MRN: [id number]  HPI  Patient is a pleasant 61 year old male with a past medical history listed below, presents to the outpatient clinic for routine followup, patient denies any complaints and reports feeling well, also reports compliance with his medications. He is still working with physical therapy to better control his chronic dizziness, also reports that he goes to Dr. Donnie Aho did check his INR levels for chronic Coumadin that he's on. No other complaints  Review of Systems  All other systems reviewed and are negative.       Objective:   Physical Exam  Constitutional: He is oriented to person, place, and time. He appears well-developed and well-nourished.  HENT:  Head: Normocephalic and atraumatic.  Eyes: Pupils are equal, round, and reactive to light.  Neck: Normal range of motion. No JVD present. No thyromegaly present.  Cardiovascular: Normal rate, regular rhythm and normal heart sounds.   Pulmonary/Chest: Effort normal and breath sounds normal. He has no wheezes. He has no rales.  Abdominal: Soft. Bowel sounds are normal. There is no tenderness. There is no rebound.  Musculoskeletal: Normal range of motion. He exhibits no edema.  Neurological: He is alert and oriented to person, place, and time.  Skin: Skin is warm and dry.    Patient Active Problem List  Diagnoses  . Incisional hernia  . DVT (deep venous thrombosis)  . Constipation  . Ataxia  . Numbness and tingling in hands  . Need for diphtheria-tetanus-pertussis (Tdap) vaccine, adult/adolescent  . Encounter for screening colonoscopy  . Anxiety  . Postural dizziness     Current Outpatient Prescriptions on File Prior to Visit  Medication Sig Dispense Refill  . acetaminophen (TYLENOL) 500 MG tablet Take 500 mg by mouth every 6 (six) hours as needed.        . Multiple Vitamin (MULITIVITAMIN WITH MINERALS) TABS Take 1 tablet by mouth  daily.      Marland Kitchen oxyCODONE-acetaminophen (ROXICET) 5-325 MG per tablet Take 1 tablet by mouth every 6 (six) hours as needed for pain.  50 tablet  0  . PARoxetine (PAXIL) 10 MG tablet TAKE ONE TABLET BY MOUTH EVERY MORNING  30 tablet  0  . warfarin (COUMADIN) 6 MG tablet Take 1 tablet (6 mg total) by mouth daily.        No Known Allergies

## 2011-04-14 NOTE — Patient Instructions (Signed)
--   Please take your medications as prescribed.

## 2011-04-17 ENCOUNTER — Ambulatory Visit: Payer: Managed Care, Other (non HMO) | Admitting: Rehabilitative and Restorative Service Providers"

## 2011-04-21 ENCOUNTER — Ambulatory Visit: Payer: Managed Care, Other (non HMO) | Admitting: Rehabilitative and Restorative Service Providers"

## 2011-04-22 ENCOUNTER — Ambulatory Visit (INDEPENDENT_AMBULATORY_CARE_PROVIDER_SITE_OTHER): Payer: Managed Care, Other (non HMO) | Admitting: General Surgery

## 2011-04-22 ENCOUNTER — Encounter (INDEPENDENT_AMBULATORY_CARE_PROVIDER_SITE_OTHER): Payer: Self-pay | Admitting: General Surgery

## 2011-04-22 VITALS — BP 130/84 | HR 60 | Temp 98.1°F | Resp 16 | Ht 69.0 in | Wt 181.1 lb

## 2011-04-22 DIAGNOSIS — Z8719 Personal history of other diseases of the digestive system: Secondary | ICD-10-CM | POA: Insufficient documentation

## 2011-04-22 DIAGNOSIS — Z9889 Other specified postprocedural states: Secondary | ICD-10-CM | POA: Insufficient documentation

## 2011-04-22 NOTE — Progress Notes (Signed)
Subjective:     Patient ID: Jordan Long, male   DOB: 09/16/1950, 61 y.o.   MRN: [id number]  HPI Patient presents for followup status post incisional hernia repair with component separation. He continues to have intermittent pain along his abdominal wall. He is also having significant dizzy spells. He is being followed by neurology. They have yet to diagnose the cause of this. He is on long-term disability at this time. His job requires a lot of heavy lifting which is unable to do.  Review of Systems     Objective:   Physical Exam  Constitutional: He appears well-developed and well-nourished.  Neck: Normal range of motion. Neck supple.  Cardiovascular: Normal rate and normal heart sounds.   Pulmonary/Chest: Effort normal and breath sounds normal. No respiratory distress.  Abdominal: Soft.       Minimal tenderness a the upper portion of his incision. His hernia repair remains intact. There is no evidence of infection.       Assessment:     Pain status post hernia repair, recurrent dizziness and ataxia    Plan:     Patient remains unable to return to work. He is to require pain medication. He has significant dizzy spells for which a neurology workup is ongoing. His issues prevent him from doing his job in any capacity at this time. I will see him back in followup.

## 2011-04-24 ENCOUNTER — Ambulatory Visit: Payer: Managed Care, Other (non HMO) | Admitting: Rehabilitative and Restorative Service Providers"

## 2011-05-28 ENCOUNTER — Telehealth (INDEPENDENT_AMBULATORY_CARE_PROVIDER_SITE_OTHER): Payer: Self-pay

## 2011-05-28 NOTE — Telephone Encounter (Signed)
I notified the pt that Dr Janee Morn will have to complete his Rosann Auerbach form at his May 8th visit.  It requires further examination

## 2011-06-10 ENCOUNTER — Encounter (INDEPENDENT_AMBULATORY_CARE_PROVIDER_SITE_OTHER): Payer: Self-pay | Admitting: General Surgery

## 2011-06-10 ENCOUNTER — Ambulatory Visit (INDEPENDENT_AMBULATORY_CARE_PROVIDER_SITE_OTHER): Payer: Managed Care, Other (non HMO) | Admitting: General Surgery

## 2011-06-10 VITALS — BP 152/80 | HR 66 | Temp 98.4°F | Ht 70.0 in | Wt 183.8 lb

## 2011-06-10 DIAGNOSIS — Z9889 Other specified postprocedural states: Secondary | ICD-10-CM

## 2011-06-10 DIAGNOSIS — Z8719 Personal history of other diseases of the digestive system: Secondary | ICD-10-CM

## 2011-06-10 DIAGNOSIS — K432 Incisional hernia without obstruction or gangrene: Secondary | ICD-10-CM

## 2011-06-10 MED ORDER — OXYCODONE-ACETAMINOPHEN 5-325 MG PO TABS: 1.0000 | ORAL_TABLET | Freq: Four times a day (QID) | ORAL | Status: DC | PRN

## 2011-06-10 NOTE — Progress Notes (Signed)
Subjective:     Patient ID: Jordan Long, male   DOB: Mar 03, 1950, 61 y.o.   MRN: [id number]  HPI  Patient presents for followup status post incisional hernia repair with mesh and component separation. He is still having some pain although this is gradually improving. He continues to have difficulty with some dizziness and he is following up at the [facility] for that. Additionally, he is having some right knee pain and currently has a brace. Review of Systems     Objective:   Physical Exam  HENT:  Head: Normocephalic and atraumatic.  Cardiovascular: Normal rate and normal heart sounds.   Pulmonary/Chest: Effort normal and breath sounds normal. No respiratory distress. He has no wheezes.  Abdominal: Soft. He exhibits no distension. There is tenderness. There is no rebound.       Hernia repair remains intact, minimal tenderness no masses  Musculoskeletal:       Decreased range of motion right knee, brace in place  Other musculoskeletal evaluation was done as part of his physical ability assessment a copy of which is in the chart       Assessment:     Gradual improvement after incisional hernia repair    Plan:     Patient remains unable to return to work he needs further time to recover from his hernia repair and other medical issues have arisen including his dizziness and problems with his right knee which also affected his ability to work at this time. I completed his physical ability assessment for [employer]. I refilled his pain medication and we'll see him back for further evaluation in 2 months.

## 2011-07-13 ENCOUNTER — Ambulatory Visit (INDEPENDENT_AMBULATORY_CARE_PROVIDER_SITE_OTHER): Payer: Managed Care, Other (non HMO) | Admitting: Internal Medicine

## 2011-07-13 ENCOUNTER — Ambulatory Visit (HOSPITAL_COMMUNITY)
Admission: RE | Admit: 2011-07-13 | Discharge: 2011-07-13 | Disposition: A | Discharge status: Home / Self Care | Payer: Managed Care, Other (non HMO) | Source: Ambulatory Visit | Attending: Internal Medicine | Admitting: Internal Medicine

## 2011-07-13 ENCOUNTER — Encounter: Payer: Self-pay | Admitting: Internal Medicine

## 2011-07-13 VITALS — BP 116/76 | HR 75 | Temp 98.3°F | Ht 70.0 in | Wt 184.4 lb

## 2011-07-13 DIAGNOSIS — M25569 Pain in unspecified knee: Secondary | ICD-10-CM | POA: Insufficient documentation

## 2011-07-13 DIAGNOSIS — M25562 Pain in left knee: Secondary | ICD-10-CM

## 2011-07-13 DIAGNOSIS — M549 Dorsalgia, unspecified: Secondary | ICD-10-CM

## 2011-07-13 DIAGNOSIS — M51379 Other intervertebral disc degeneration, lumbosacral region without mention of lumbar back pain or lower extremity pain: Secondary | ICD-10-CM | POA: Insufficient documentation

## 2011-07-13 DIAGNOSIS — M1120 Other chondrocalcinosis, unspecified site: Secondary | ICD-10-CM | POA: Insufficient documentation

## 2011-07-13 DIAGNOSIS — M25561 Pain in right knee: Secondary | ICD-10-CM

## 2011-07-13 DIAGNOSIS — I82409 Acute embolism and thrombosis of unspecified deep veins of unspecified lower extremity: Secondary | ICD-10-CM

## 2011-07-13 DIAGNOSIS — K219 Gastro-esophageal reflux disease without esophagitis: Secondary | ICD-10-CM | POA: Insufficient documentation

## 2011-07-13 DIAGNOSIS — M170 Bilateral primary osteoarthritis of knee: Secondary | ICD-10-CM | POA: Insufficient documentation

## 2011-07-13 DIAGNOSIS — M5137 Other intervertebral disc degeneration, lumbosacral region: Secondary | ICD-10-CM | POA: Insufficient documentation

## 2011-07-13 DIAGNOSIS — M1710 Unilateral primary osteoarthritis, unspecified knee: Secondary | ICD-10-CM | POA: Insufficient documentation

## 2011-07-13 MED ORDER — RANITIDINE HCL 300 MG PO TABS: 300.0000 mg | ORAL_TABLET | Freq: Every day | ORAL | Status: DC

## 2011-07-13 NOTE — Assessment & Plan Note (Signed)
Concerning for degenerative disease. Will obtain x-ray. Again recommended physical therapy but patient is reluctant. Provided handout for exercises.

## 2011-07-13 NOTE — Assessment & Plan Note (Signed)
Possible degenerative disc disease. Will obtain x-ray of the spine. Recommended physical therapy but patient is now reluctant. Therefore provided back exercises. Recommended to take either oxycodone/acetaminophen or Acetaminophen only but not both together

## 2011-07-13 NOTE — Assessment & Plan Note (Addendum)
Patient is still reluctant to proceed with colonoscopy. Discussed the importance that he needs to be done in the future.

## 2011-07-13 NOTE — Progress Notes (Signed)
Subjective:   Patient ID: Jordan Long male   DOB: 09-27-1950 61 y.o.   MRN: [id number]  HPI: Jordan Long is a 61 y.o. male with past medical history significant as outlined below who presented to the clinic with back and knee pain.  1. Back pain: The pain is located in the lower part of the back. Patient reports the back pain has been present for very long time but has been progressively getting worse. The pain is described as an aching pain worse with long-standing. Denies any significant weakness or numbness as well as no bowel or bladder incontinence. Denies any fevers or chills or rashes. 2. Knee pain: Patient reports about knee pain which has been present for couple of months. Patient reports the right knee is worse than the left knee. He reports that he [facility] she may notice some swelling but denies any redness. He also reports about numbness from the knee down to his foot and some weakness when he walks up steps. He also noted that her knees sometimes give out on him and hear some crepitus. Patient reported that he has been taking the opoids just 1 tablets once a day otherwise he would use Tylenol.  Past Medical History  Diagnosis Date  . DVT (deep venous thrombosis) 2010 and 10/2010    after Hernia repair in 2010 s/p IVC filter and coumadin. Next episode after Hernia repair  ( 10/23/2010) . Admitted on 11/02/2010 for repeat DVT. Warfarin was stopped for  5 days before surgery.   . Incisional hernia 08/20/2010  . Constipation 11/20/2010  . Headache    Current Outpatient Prescriptions  Medication Sig Dispense Refill  . acetaminophen (TYLENOL) 500 MG tablet Take 500 mg by mouth every 6 (six) hours as needed.        . Multiple Vitamin (MULITIVITAMIN WITH MINERALS) TABS Take 1 tablet by mouth daily.      Marland Kitchen oxyCODONE-acetaminophen (ROXICET) 5-325 MG per tablet Take 1 tablet by mouth every 6 (six) hours as needed for pain.  50 tablet  0  . PARoxetine (PAXIL) 10 MG tablet  TAKE ONE TABLET BY MOUTH EVERY MORNING  30 tablet  0  . warfarin (COUMADIN) 6 MG tablet Take 1 tablet (6 mg total) by mouth daily.       Family History  Problem Relation Age of Onset  . Cancer Father     Lymphoma   . Raynaud syndrome Father   . Diabetes Mother   . Parkinsonism Mother   . Thyroid disease Sister   . Diabetes Sister   . Raynaud syndrome Sister   . Lupus Sister   . Raynaud syndrome Sister    History   Social History  . Marital Status: Divorced    Spouse Name: N/A    Number of Children: N/A  . Years of Education: N/A   Social History Main Topics  . Smoking status: Former Smoker    Quit date: 09/23/1985  . Smokeless tobacco: Never Used  . Alcohol Use: Yes     occasional only. Beer.   . Drug Use: No  . Sexually Active: Not Currently   Other Topics Concern  . Not on file   Social History Narrative  . No narrative on file   Review of Systems: Constitutional: Denies fever, chills, diaphoresis, appetite change and fatigue.  Respiratory: Denies SOB, DOE, cough, chest tightness,  and wheezing.   Cardiovascular: Denies chest pain, palpitations and leg swelling.  Gastrointestinal: Denies nausea, vomiting, abdominal pain,  diarrhea, constipation, blood in stool and abdominal distention.  Genitourinary: Denies dysuria, urgency, frequency, hematuria, flank pain and difficulty urinating.  Musculoskeletal: Noted myalgias, back pain, joint swelling, arthralgias and gait problem.  Skin: Denies pallor, rash and wound.  Neurological: Denies dizziness and headaches.  Hematological: Denies adenopathy.   Objective:  Physical Exam: Filed Vitals:   07/13/11 1454  Temp: 98.3 F (36.8 C)  TempSrc: Oral  Height: 5\' 10"  (1.778 m)  Weight: 184 lb 6.4 oz (83.643 kg)   Constitutional: Vital signs reviewed.  Patient is a well-developed and well-nourished woman in no acute distress and cooperative with exam. Alert and oriented x3.  Head: Normocephalic and atraumatic  Neck:  Supple,  Cardiovascular: RRR, S1 normal, S2 normal, no MRG, pulses symmetric and intact bilaterally Pulmonary/Chest: CTAB, no wheezes, rales, or rhonchi Abdominal: Soft. Non-tender, non-distended, bowel sounds are normal, .  GU: no CVA tenderness Musculoskeletal:  1. back ; lumbar spine ;mild tenderness to paraspinal on palpation . No rashes notable.  No . No masses. Straight leg test negative. 2. Right Knee: Pain with internal rotation and straight leg elevation at the lateral side. Mild swelling of the right leg notable with no redness. Crepitus notable.  3. left knee : Very mild pain with internal rotation and straight leg elevation. No swelling notable no crepitus.  Hematology: no cervical Neurological: A&O x3, Strenght is normal and symmetric bilaterally,  sensory intact to light touch bilaterally.  Skin: Warm, dry and intact. No rash, cyanosis, or clubbing.  Psychiatric: Normal mood and affect. speech and behavior is normal.

## 2011-07-13 NOTE — Patient Instructions (Signed)
Only take oxycodone/acetaminophen or Acetaminophen only  both together

## 2011-07-29 ENCOUNTER — Ambulatory Visit (INDEPENDENT_AMBULATORY_CARE_PROVIDER_SITE_OTHER): Payer: Managed Care, Other (non HMO) | Admitting: General Surgery

## 2011-07-29 ENCOUNTER — Encounter (INDEPENDENT_AMBULATORY_CARE_PROVIDER_SITE_OTHER): Payer: Self-pay | Admitting: General Surgery

## 2011-07-29 VITALS — BP 136/84 | HR 68 | Temp 98.4°F | Resp 16 | Ht 70.0 in | Wt 184.0 lb

## 2011-07-29 DIAGNOSIS — K432 Incisional hernia without obstruction or gangrene: Secondary | ICD-10-CM

## 2011-07-29 MED ORDER — OXYCODONE-ACETAMINOPHEN 5-325 MG PO TABS: 1.0000 | ORAL_TABLET | Freq: Four times a day (QID) | ORAL | Status: DC | PRN

## 2011-07-29 NOTE — Progress Notes (Signed)
Subjective:     Patient ID: Jordan Long, male   DOB: [birth year], 61 y.o.   MRN: [id number]  HPI Patient presents For followup of incisional hernia repair with component separation and mesh. He is improving. He is mostly having some difficulty with hip pain bilaterally and right knee pain. He is seen [facility] for that. He has completed physical therapy for dizziness. This therapy was ordered after neurologic workup. That has improved some period his abdomen is much improved. He is occasionally slashed once a day taking oxycodone. He's eating and moving his bowels fine.  Review of Systems     Objective:   Physical Exam  Cardiovascular: Normal rate and regular rhythm.   Pulmonary/Chest: Effort normal and breath sounds normal.  Abdominal:       Soft and nontender. Hernia repair is intact. Incisions are well healed.       Assessment:     Coming on gradually after incisional hernia repair with component separation and mesh    Plan:     I refilled his pain medicine. I discussed with him about tapering of this over the next 2 months. See him back then.

## 2011-08-17 ENCOUNTER — Encounter: Payer: Self-pay | Admitting: Internal Medicine

## 2011-08-17 ENCOUNTER — Ambulatory Visit (INDEPENDENT_AMBULATORY_CARE_PROVIDER_SITE_OTHER): Payer: Managed Care, Other (non HMO) | Admitting: Internal Medicine

## 2011-08-17 VITALS — BP 126/76 | HR 69 | Temp 97.6°F | Ht 70.0 in | Wt 183.3 lb

## 2011-08-17 DIAGNOSIS — W208XXA Other cause of strike by thrown, projected or falling object, initial encounter: Secondary | ICD-10-CM

## 2011-08-17 DIAGNOSIS — M549 Dorsalgia, unspecified: Secondary | ICD-10-CM

## 2011-08-17 DIAGNOSIS — K219 Gastro-esophageal reflux disease without esophagitis: Secondary | ICD-10-CM

## 2011-08-17 DIAGNOSIS — M25562 Pain in left knee: Secondary | ICD-10-CM

## 2011-08-17 DIAGNOSIS — IMO0002 Reserved for concepts with insufficient information to code with codable children: Secondary | ICD-10-CM | POA: Insufficient documentation

## 2011-08-17 DIAGNOSIS — M25569 Pain in unspecified knee: Secondary | ICD-10-CM

## 2011-08-17 DIAGNOSIS — M25561 Pain in right knee: Secondary | ICD-10-CM

## 2011-08-17 MED ORDER — ESOMEPRAZOLE MAGNESIUM 40 MG PO PACK: 40.0000 mg | PACK | Freq: Every day | ORAL | Status: DC

## 2011-08-17 MED ORDER — CALCIUM + D3 600-200 MG-UNIT PO TABS: 1.0000 | ORAL_TABLET | Freq: Every day | ORAL | Status: DC

## 2011-08-17 NOTE — Assessment & Plan Note (Signed)
I will refer patient to sports medicine for possible steroid injection patient. He is high risk for falls. Discussed about physical therapy but patient is somewhat reluctant since patient does not want to accumulate further  medical bills. He would prefer if he was given home PT diuretic shins and he will try to coordinate it with his coach at the fitness center. But if really needed he will be willing to go.

## 2011-08-17 NOTE — Assessment & Plan Note (Signed)
I will change Zantac to Nexium 40 mg daily. And reevaluate for possible changes in management.

## 2011-08-17 NOTE — Progress Notes (Signed)
Subjective:   Patient ID: Jordan Long male   DOB: [birth year] 61 y.o.   MRN: [id number]  HPI: [name] is a 61 y.o. male with past days he significant as outlined below who presented to the clinic for a followup of back and knee pain. X-rays were obtained which showed degenerative disease of both knees and an old compression fracture at T11.   1. knee pain; patient reports the pain is worsening at this point especially the right knee. He is continuing to wear her brace which is helping occasionally so it does not give out. He's taking Tylenol on a regular basis and oxycodone 1 tablets a day if the pain is bad. He is doing exercises 4-5 times a week as much as he is tolerating  2. back pain; patient reports the back pain has somewhat improved although he noted occasional use some pain on his hip area and some shooting pain from the knee down to his feet mostly on the right hand side. Denies any significant weakness, loss of bowel or bladder control, rashes, fevers or chills.  3. Ingestion: it can be present all the time. He has been taking ranitidine on a regular basis which seems to be not working anymore. He noted this is really bordering him.    Past Medical History  Diagnosis Date  . DVT (deep venous thrombosis) 2010 and 10/2010    after Hernia repair in 2010 s/p IVC filter and coumadin. Next episode after Hernia repair  ( 10/23/2010) . Admitted on 11/02/2010 for repeat DVT. Warfarin was stopped for  5 days before surgery.   . Incisional hernia 08/20/2010  . Constipation 11/20/2010  . Headache    Current Outpatient Prescriptions  Medication Sig Dispense Refill  . acetaminophen (TYLENOL) 500 MG tablet Take 500 mg by mouth every 6 (six) hours as needed.        . Multiple Vitamin (MULITIVITAMIN WITH MINERALS) TABS Take 1 tablet by mouth daily.      Marland Kitchen oxyCODONE-acetaminophen (ROXICET) 5-325 MG per tablet Take 1 tablet by mouth every 6 (six) hours as needed for pain.  50  tablet  0  . ranitidine (ZANTAC) 300 MG tablet Take 1 tablet (300 mg total) by mouth at bedtime.  30 tablet  0  . warfarin (COUMADIN) 6 MG tablet Take 1 tablet (6 mg total) by mouth daily.       Family History  Problem Relation Age of Onset  . Cancer Father     Lymphoma   . Raynaud syndrome Father   . Diabetes Mother   . Parkinsonism Mother   . Thyroid disease Sister   . Diabetes Sister   . Raynaud syndrome Sister   . Lupus Sister   . Raynaud syndrome Sister    History   Social History  . Marital Status: Divorced    Spouse Name: N/A    Number of Children: N/A  . Years of Education: N/A   Social History Main Topics  . Smoking status: Former Smoker    Quit date: 09/23/1985  . Smokeless tobacco: Never Used  . Alcohol Use: No  . Drug Use: No  . Sexually Active: Not Currently   Other Topics Concern  . None   Social History Narrative  . None   Review of Systems: Constitutional: Denies fever, chills, diaphoresis, appetite change and fatigue.    Respiratory: Denies SOB, DOE, cough, chest tightness,  and wheezing.   Cardiovascular: Denies chest pain, palpitations and leg swelling.  Gastrointestinal: Denies nausea, vomiting, abdominal pain, diarrhea, constipation, Genitourinary: Denies dysuria, urgency, frequency,   MSK: Has trouble with the gait do to Knee pain  Skin: Denies pallor, rash and wound.  Neurological: Denies dizziness  Objective:  Physical Exam: Filed Vitals:   08/17/11 1418  BP: 126/76  Pulse: 69  Temp: 97.6 F (36.4 C)  TempSrc: Oral  Height: 5\' 10"  (1.778 m)  Weight: 183 lb 4.8 oz (83.144 kg)  SpO2: 95%   Constitutional: Vital signs reviewed.  Patient is a well-developed and well-nourished man in no acute distress and cooperative with exam. Alert and oriented x3.  Neck: Supple,  Cardiovascular: RRR, S1 normal, S2 normal, no MRG, pulses symmetric and intact bilaterally Pulmonary/Chest: CTAB, no wheezes, rales, or rhonchi Abdominal: Soft.  Non-tender, non-distended, bowel sounds are normal,  Musculoskeletal: Knee: Tender to palpation worse on the right. Pain reviewed producible with internal and external rotation. Right knee but able to extend completely due to significant pain . No joint deformities, erythema.  Back : Mild tenderness on palpation paraspinal in the lumbar area. No rashes noted. No shooting pain reproducible.   Neurological: A&O x3, Strength is normal and symmetric bilaterally but patient noted pain in his knees especially in the right one.   Sensory intact to light touch bilaterally.  Skin: Warm, dry and intact. No rash, cyanosis, or clubbing.

## 2011-08-17 NOTE — Assessment & Plan Note (Addendum)
New warning signs noted. Symptom seems to be improving. I will continue to monitor were. Recommended exercise.

## 2011-08-17 NOTE — Assessment & Plan Note (Addendum)
Patient had history of trauma while working at [employer]  when boxes would fell on his back years ago. The fractures are new from 2011.  After discussing with Dr. Aundria Rud will not proceed with a DEXA scan. Patient is not a candidate for bisphosphonate concerning significant history of GERD . No lytic lesion noted on x-ray therefore we'll continue to monitor .  I recommended calcium plus vitamin D on a daily basis. I will obtain vitamin D level for possible changes in management.

## 2011-08-19 ENCOUNTER — Ambulatory Visit (INDEPENDENT_AMBULATORY_CARE_PROVIDER_SITE_OTHER): Payer: Managed Care, Other (non HMO) | Admitting: Sports Medicine

## 2011-08-19 ENCOUNTER — Encounter: Payer: Self-pay | Admitting: Sports Medicine

## 2011-08-19 ENCOUNTER — Other Ambulatory Visit: Payer: Self-pay | Admitting: *Deleted

## 2011-08-19 VITALS — BP 122/77 | Ht 70.0 in | Wt 181.0 lb

## 2011-08-19 DIAGNOSIS — M179 Osteoarthritis of knee, unspecified: Secondary | ICD-10-CM

## 2011-08-19 DIAGNOSIS — M25561 Pain in right knee: Secondary | ICD-10-CM

## 2011-08-19 DIAGNOSIS — M1710 Unilateral primary osteoarthritis, unspecified knee: Secondary | ICD-10-CM

## 2011-08-19 DIAGNOSIS — IMO0002 Reserved for concepts with insufficient information to code with codable children: Secondary | ICD-10-CM

## 2011-08-19 DIAGNOSIS — K219 Gastro-esophageal reflux disease without esophagitis: Secondary | ICD-10-CM

## 2011-08-19 DIAGNOSIS — M25569 Pain in unspecified knee: Secondary | ICD-10-CM

## 2011-08-19 MED ORDER — ESOMEPRAZOLE MAGNESIUM 40 MG PO PACK: 40.0000 mg | PACK | Freq: Every day | ORAL | Status: DC

## 2011-08-19 NOTE — Progress Notes (Signed)
  Subjective:    Patient ID: Jordan Long, male    DOB: 1950/08/05, 61 y.o.   MRN: [id number]  HPI chief complaint: Bilateral knee pain  Patient is a pleasant 61 year old male comes in today complaining of long-standing bilateral knee pain. Right knee is more painful than the left. Remote history of some sort of surgery on this knee but he does not remember the details. No recent trauma. Main complaint is a dull ache in the anterior portion of his knee. It is constantly present but worse with activity. He is not noticed any swelling. No mechanical symptoms. No feelings of instability. He is unable to take NSAIDs do to recurring DVTs which has him on chronic Coumadin. He takes Tylenol and occasional Percocet both of which seem to be helpful. He had x-rays of his knees done a couple weeks ago. They are available for review. He denies any fevers or chills. Has not noticed any erythema. He is here today with his wife.  Medical history is significant for DVTs. He has a filter in place. Also history of reflux disease Medications include Tylenol, Nexium, occasional oxycodone, and Coumadin. No known drug allergies Is a former smoker, does not drink alcohol.    Review of Systems     Objective:   Physical Exam Well-developed, well-nourished. No acute distress. Right knee: 5 extension lag with flexion to 90. Trace effusion. Significant patellofemoral crepitus. He is tender to palpation along the lateral joint line with pain but no popping with McMurray's. No tenderness along the medial joint line. Knee is stable to valgus and varus stressing. Negative anterior drawer negative posterior drawer. Good strength. He is neurovascular intact distally Left knee: Range of motion 0-120. No effusion. Knee is stable to ligamentous exam. Significant patellofemoral crepitus with active extension. No joint line tenderness, negative McMurray's. Good strength. Neurovascular intact distally. Skin is intact without  erythema. He walks with a noticeable limp.  X-rays of each of his knees are reviewed. They are nonweightbearing films. He has advanced degenerative changes at the patellofemoral joint and moderate medial joint space narrowing. Nothing acute is seen       Assessment & Plan:  #1. Bilateral knee pain right greater than left secondary to advanced patellofemoral DJD #2. Chronic DVT, on Coumadin   Patient's knee is injected with 40 mg of Depo-Medrol and 3 cc of Marcaine. This was done atraumatically under sterile technique after risks and benefits were obtained. Consent form was signed. Anterior lateral approach was utilized. He tolerated this without any difficulty. Open chain quad exercises, exercise bike, and pool exercises were encouraged. Home exercise program was provided. Continue with Tylenol and oxycodone as needed. Information on [facility] was provided. This may be our next step in treatment if the patient notices a little relief with cortisone injection. Patient has a knee sleeve to utilize with activity. Also encouraged post exercise icing. Also discussed a 6 week trial of glucosamine. Patient will followup with me when necessary

## 2011-08-19 NOTE — Telephone Encounter (Signed)
Fax from [employer] - Nexium 40mg  Packet     Please verify capsules or granules. Thanks

## 2011-08-19 NOTE — Patient Instructions (Addendum)
We've injected your knee with cortisone today It's okay to do nonimpact activity such as riding a bike or pool exercise Continue with her Tylenol and occasional oxycodone If pain persists we can consider starting Supartz

## 2011-08-20 LAB — VITAMIN D 1,25 DIHYDROXY
Vitamin D 1, 25 (OH)2 Total: 49 pg/mL (ref 18–72)
Vitamin D2 1, 25 (OH)2: <8 pg/mL

## 2011-09-10 ENCOUNTER — Telehealth (INDEPENDENT_AMBULATORY_CARE_PROVIDER_SITE_OTHER): Payer: Self-pay | Admitting: General Surgery

## 2011-09-10 NOTE — Telephone Encounter (Signed)
Pt called to request pain med for his knee.  Surgery was in remote past, so explained Dr. Janee Morn would not likely renew pain meds at this late date.  He then stated he had called Dr. Loistine Chance, but that MD was unavailable, and he had not yet be able to get through to his ortho physician to request meds.  He understands that Dr. Janee Morn (or [facility]) will not be able to help with this.

## 2011-09-14 ENCOUNTER — Ambulatory Visit (INDEPENDENT_AMBULATORY_CARE_PROVIDER_SITE_OTHER): Payer: Managed Care, Other (non HMO) | Admitting: Sports Medicine

## 2011-09-14 VITALS — BP 148/78 | Ht 70.0 in | Wt 182.0 lb

## 2011-09-14 DIAGNOSIS — M25569 Pain in unspecified knee: Secondary | ICD-10-CM

## 2011-09-14 DIAGNOSIS — M25561 Pain in right knee: Secondary | ICD-10-CM | POA: Insufficient documentation

## 2011-09-14 MED ORDER — TRAMADOL HCL 50 MG PO TABS: 50.0000 mg | ORAL_TABLET | Freq: Four times a day (QID) | ORAL | Status: DC | PRN

## 2011-09-14 NOTE — Patient Instructions (Addendum)
You have been scheduled for a MRI on 09/15/11 at 5:30 pm at [facility] located at [name] [place] [address].  Their phone number is [phone].  Thank you for seeing [place] today!

## 2011-09-14 NOTE — Progress Notes (Addendum)
  Subjective:    Patient ID: Jordan Long, male    DOB: 02/02/1951, 61 y.o.   MRN: [id number]  HPI  61 year old male presents for followup of bilateral knee pain, with the right worse than left. He is previously evaluated on July 17, and he received an injection of Depo-Medrol into the right knee. He states today that the injection did not relieve any pain. His pain is persistent and throbbing, with intermittent periods is more severe anterior knee pain. He also notes back pain that seems to elicit shooting pain That seems to radiate down his right lower extremity. He takes only Tylenol for pain, and cannot tolerate NSAIDs due to being on Coumadin. He tends to walk daily for exercise, but is limited by his pain. He also has difficulty going from sitting to lying on the floor to do exercises. He denies any falls, and denies using any walking assist device. He is accompanied today by his sister, who notes that his pain is much more severe and debilitating and he is willing to say.  Review of Systems Otherwise negative    Objective:   Physical Exam Gen.: Very pleasant, well-appearing white male, in mild distress with movement of the lower extremity. Right knee: Mild anterior swelling. Significant crepitus with passive flexion and extension. 5 lag on extension. Flexion to 100. Negative Lockman's test. Significant tenderness to palpation in suprapatellar area with large palpable bone spur. Strength testing limited by pain. Left knee: No swelling, minimal crepitus with flexion-extension, extension and flexion to 120.5/5 strength      Assessment & Plan:  61 year old man with significant right knee pain that may be multifactorial in etiology. It is known that he has significant patellofemoral osteoarthritis with large bone spur in the suprapatellar region. He has very limited range of motion and could represent an intra-articular loose body versus meniscal tear. Therefore further imaging is  needed To delineate the cause of a problem and create an appropriate plan. An MRI scan will be obtained. Additionally the patient will be given a prescription for tramadol for pain control. He will followup within one week after his MRI has been obtained.   Addendum 09/16/11... I spoke with the patient and his sister today on the phone regarding MRI findings of his knee. He has a large displaced bucket handle lateral meniscal tear which is responsible for his mechanical symptoms. He also has underlying lateral compartmental DJD. I've referred the patient to Dr. Thurston Hole to discussed the merits of arthroscopic meniscectomy. He has an appointment with him at 945 on August 15. Further treatment will be per Dr. Sherene Sires discretion.

## 2011-09-16 ENCOUNTER — Encounter: Payer: Self-pay | Admitting: Sports Medicine

## 2011-09-16 ENCOUNTER — Ambulatory Visit: Payer: Managed Care, Other (non HMO) | Admitting: Family Medicine

## 2011-09-16 ENCOUNTER — Encounter: Payer: Self-pay | Admitting: *Deleted

## 2011-09-16 NOTE — Progress Notes (Signed)
Patient ID: Jordan Long, male   DOB: April 30, 1950, 61 y.o.   MRN: [id number]   Pt scheduled to see Dr. Thurston Hole 09/17/11 at 9:45 for rt knee meniscal tear.  Pt notified of appt via phone.

## 2011-10-04 DIAGNOSIS — I82409 Acute embolism and thrombosis of unspecified deep veins of unspecified lower extremity: Secondary | ICD-10-CM

## 2011-10-04 HISTORY — DX: Acute embolism and thrombosis of unspecified deep veins of unspecified lower extremity: I82.409

## 2011-10-04 HISTORY — PX: KNEE SURGERY: SHX244

## 2011-10-07 ENCOUNTER — Encounter (INDEPENDENT_AMBULATORY_CARE_PROVIDER_SITE_OTHER): Payer: Self-pay | Admitting: General Surgery

## 2011-10-07 ENCOUNTER — Ambulatory Visit (INDEPENDENT_AMBULATORY_CARE_PROVIDER_SITE_OTHER): Payer: Managed Care, Other (non HMO) | Admitting: General Surgery

## 2011-10-07 VITALS — BP 119/78 | HR 82 | Temp 97.2°F | Resp 18 | Ht 70.0 in | Wt 177.4 lb

## 2011-10-07 DIAGNOSIS — K432 Incisional hernia without obstruction or gangrene: Secondary | ICD-10-CM

## 2011-10-07 MED ORDER — OXYCODONE-ACETAMINOPHEN 5-325 MG PO TABS: 1.0000 | ORAL_TABLET | Freq: Four times a day (QID) | ORAL | Status: DC | PRN

## 2011-10-07 NOTE — Progress Notes (Signed)
Subjective:     Patient ID: Jordan Long, male   DOB: 1950-11-28, 61 y.o.   MRN: [id number]  HPI Patient presents for followup status post incisional hernia repair with component separation mesh. He is doing well. He had knee surgery recently. He has not been taking very much pain medication for his abdominal surgery just one pill a day.  Review of Systems     Objective:   Physical Exam Abdomen is soft and nontender. Hernia repair is intact. He has some small bruises from Lovenox injections.    Assessment:     Doing well after incisional hernia repair with component separation mesh    Plan:     I gave a small refill of his pain medication. He will return to follow up only as needed. I answered his questions. He is very happy with his care.

## 2011-11-16 ENCOUNTER — Encounter: Payer: Managed Care, Other (non HMO) | Admitting: Internal Medicine

## 2011-12-21 ENCOUNTER — Encounter: Payer: Managed Care, Other (non HMO) | Admitting: Internal Medicine

## 2012-02-01 ENCOUNTER — Ambulatory Visit (INDEPENDENT_AMBULATORY_CARE_PROVIDER_SITE_OTHER): Payer: Managed Care, Other (non HMO) | Admitting: Internal Medicine

## 2012-02-01 ENCOUNTER — Encounter: Payer: Self-pay | Admitting: Internal Medicine

## 2012-02-01 VITALS — BP 127/80 | HR 93 | Temp 96.8°F | Ht 70.0 in | Wt 184.4 lb

## 2012-02-01 DIAGNOSIS — K219 Gastro-esophageal reflux disease without esophagitis: Secondary | ICD-10-CM

## 2012-02-01 DIAGNOSIS — M25561 Pain in right knee: Secondary | ICD-10-CM

## 2012-02-01 DIAGNOSIS — Z23 Encounter for immunization: Secondary | ICD-10-CM

## 2012-02-01 DIAGNOSIS — M549 Dorsalgia, unspecified: Secondary | ICD-10-CM

## 2012-02-01 DIAGNOSIS — M25562 Pain in left knee: Secondary | ICD-10-CM

## 2012-02-01 DIAGNOSIS — Z1211 Encounter for screening for malignant neoplasm of colon: Secondary | ICD-10-CM | POA: Insufficient documentation

## 2012-02-01 LAB — COMPREHENSIVE METABOLIC PANEL
ALT: 18 U/L (ref 0–53)
CO2: 27 mEq/L (ref 19–32)
Calcium: 10.8 mg/dL — ABNORMAL HIGH (ref 8.4–10.5)
Chloride: 103 mEq/L (ref 96–112)
Creat: 1.27 mg/dL (ref 0.50–1.35)
Glucose, Bld: 92 mg/dL (ref 70–99)
Sodium: 137 mEq/L (ref 135–145)
Total Bilirubin: 0.4 mg/dL (ref 0.3–1.2)
Total Protein: 7.2 g/dL (ref 6.0–8.3)

## 2012-02-01 LAB — CBC
Hemoglobin: 15.4 g/dL (ref 13.0–17.0)
MCH: 29.8 pg (ref 26.0–34.0)
MCV: 87.8 fL (ref 78.0–100.0)
Platelets: 282 10*3/uL (ref 150–400)
RBC: 5.17 MIL/uL (ref 4.22–5.81)
WBC: 7.2 10*3/uL (ref 4.0–10.5)

## 2012-02-01 MED ORDER — OMEPRAZOLE 40 MG PO CPDR: 40.0000 mg | DELAYED_RELEASE_CAPSULE | Freq: Every day | ORAL | Status: DC

## 2012-02-01 NOTE — Assessment & Plan Note (Addendum)
Persistent back pain. Unfortunately the knee injury and DJD worsens the pain. Encouraged exercise as tolerated and pain medication.

## 2012-02-01 NOTE — Patient Instructions (Signed)
1. Drink max one cup a day of Coffee 2. I will start you on Omeprazole 40 mg daily for 4-6 weeks. If no improvement we have to see you back  3. Continue with exercise as tolerated. Please discuss with Dr Thurston Hole about limitation of exercise.

## 2012-02-01 NOTE — Assessment & Plan Note (Signed)
Will start Omeprazole 40 mg daily for 4-6 weeks. Provided information about food restriction. Will reevaluate in 4 weeks if no significant improvement patient needs to be evaluated by GI for endoscopy.

## 2012-02-01 NOTE — Assessment & Plan Note (Addendum)
Patient was given occult blood test cars.

## 2012-02-01 NOTE — Progress Notes (Signed)
Subjective:   Patient ID: Jordan Long male   DOB: 1950/12/31 61 y.o.   MRN: [id number]  HPI: Jordan Long is a 61 y.o. male with PMH significant as outlined below who presented to the clinic for a regular follow up.  1. Knee pain: patient noted to continue to have pain in the right knee. He still need to be very carefull otherwise the knee will give out on him . Takes for pain: Tyelnol 325 mg , oxycodone-acetaminophen prn ( 2-3 tablets a day max.)  Will see Dr Thurston Hole  ( Orthopedics ) on 02/04/2012. Uses a cane occasionally.   2. Heart burn/ingestion: Took tums, Ranitidin but does not work. Tried Nexium once day did not improve significant.  Patient noted that there is no difference between eating food or not. He can be resting and it will come on. He has a feeling something hot in his chest area and sometimes it feels something is stuck in the throat. No chest pain or SOB. No blood in the stool.  Does not eat any spicy food any more.  Drinks coffee 3x a day. No chocolate, peppermint or tea intake  3. Abdominal: feels a stink or grabbing in his abdomen without any aggravating or alleviating factors.  4. Back pain: lower back. Xray obtained 07/2011 which showed compression fracture of T11 and degenerative disease. Occasionally some shooting pain down his leg. Does do a lot of exercise      Past Medical History  Diagnosis Date  . DVT (deep venous thrombosis) 2010 and 10/2010    after Hernia repair in 2010 s/p IVC filter and coumadin. Next episode after Hernia repair  ( 10/23/2010) . Admitted on 11/02/2010 for repeat DVT. Warfarin was stopped for  5 days before surgery.   . Incisional hernia 08/20/2010  . Constipation 11/20/2010  . Headache    Current Outpatient Prescriptions  Medication Sig Dispense Refill  . acetaminophen (TYLENOL) 500 MG tablet Take 500 mg by mouth every 6 (six) hours as needed.        . Calcium Carb-Cholecalciferol (CALCIUM + D3) 600-200 MG-UNIT TABS Take 1  tablet by mouth daily.      Marland Kitchen esomeprazole (NEXIUM) 40 MG packet Take 40 mg by mouth daily before breakfast.  30 each  0  . Multiple Vitamin (MULITIVITAMIN WITH MINERALS) TABS Take 1 tablet by mouth daily.      Marland Kitchen oxyCODONE-acetaminophen (ROXICET) 5-325 MG per tablet Take 1 tablet by mouth every 6 (six) hours as needed for pain.  30 tablet  0  . Rivaroxaban (XARELTO PO) Take by mouth.      . traMADol (ULTRAM) 50 MG tablet Take 1 tablet (50 mg total) by mouth every 6 (six) hours as needed for pain.  60 tablet  0   Family History  Problem Relation Age of Onset  . Cancer Father     Lymphoma   . Raynaud syndrome Father   . Diabetes Mother   . Parkinsonism Mother   . Thyroid disease Sister   . Diabetes Sister   . Raynaud syndrome Sister   . Lupus Sister   . Raynaud syndrome Sister    History   Social History  . Marital Status: Divorced    Spouse Name: N/A    Number of Children: N/A  . Years of Education: N/A   Social History Main Topics  . Smoking status: Former Smoker    Quit date: 09/23/1985  . Smokeless tobacco: Never Used  . Alcohol Use:  No  . Drug Use: No  . Sexually Active: Not Currently   Other Topics Concern  . None   Social History Narrative  . None   Review of Systems: Constitutional: Denies fever, chills, diaphoresis, appetite change and fatigue.  Respiratory: Denies SOB, DOE, cough, chest tightness,  and wheezing.   Cardiovascular: Denies chest pain, palpitations and leg swelling.  Gastrointestinal: Denies nausea, vomiting, abdominal pain, diarrhea, constipation, blood in stool and abdominal distention.  Genitourinary: Denies dysuria, urgency, frequency, hematuria, flank pain and difficulty urinating.  Musculoskeletal: Noted  back pain, joint swelling, arthralgias and gait problem.  Neurological: Occasionally  Dizziness with standing up to fast ,  But denies Syncope, numbness and headaches.    Objective:  Physical Exam: Filed Vitals:   02/01/12 0902  BP:  127/80  Pulse: 93  Temp: 96.8 F (36 C)  TempSrc: Oral  Height: 5\' 10"  (1.778 m)  Weight: 184 lb 6.4 oz (83.643 kg)  SpO2: 96%   Constitutional: Vital signs reviewed.  Patient is a well-developed and well-nourished man in no acute distress and cooperative with exam. Alert and oriented x3.  Neck: Supple,  Cardiovascular: RRR, S1 normal, S2 normal, no MRG, pulses symmetric and intact bilaterally Pulmonary/Chest: CTAB, no wheezes, rales, or rhonchi Abdominal: Soft. Mild tenderness on palpation , non-distended, bowel sounds are normal, scares are present  Musculoskeletal: right knee: tenderness on palpation, decreased ROM, swelling. Left knee :No joint deformities, erythema, or stiffness, ROM full and no nontender. Lumbar area: mild tenderness on palpation. Did not obtain straight leg raise test due to pain in knee Neurological: A&O x3,  no focal motor deficit

## 2012-02-01 NOTE — Assessment & Plan Note (Addendum)
Continues to have significant pain. Currently taken some acetaminophen and oxycodone-acetaminophen for pain. Encouraged exercise as tolerated and as Dr Thurston Hole recommends. Patient will be seen by Dr Thurston Hole on 02/04/12.   At this point I do not think patient can work considering history of right knee large displaced bucket handle lateral meniscal tear and advanced DJD. Patient was given a note

## 2012-02-01 NOTE — Assessment & Plan Note (Signed)
Flu vaccination given today  

## 2012-02-10 ENCOUNTER — Other Ambulatory Visit (INDEPENDENT_AMBULATORY_CARE_PROVIDER_SITE_OTHER): Payer: Managed Care, Other (non HMO)

## 2012-02-10 DIAGNOSIS — Z1211 Encounter for screening for malignant neoplasm of colon: Secondary | ICD-10-CM

## 2012-02-10 LAB — POC HEMOCCULT BLD/STL (HOME/3-CARD/SCREEN): Fecal Occult Blood, POC: NEGATIVE

## 2012-02-15 ENCOUNTER — Other Ambulatory Visit: Payer: Self-pay | Admitting: Internal Medicine

## 2012-02-15 ENCOUNTER — Telehealth (INDEPENDENT_AMBULATORY_CARE_PROVIDER_SITE_OTHER): Payer: Self-pay | Admitting: General Surgery

## 2012-02-15 DIAGNOSIS — K219 Gastro-esophageal reflux disease without esophagitis: Secondary | ICD-10-CM

## 2012-02-15 NOTE — Telephone Encounter (Signed)
Patient called in saying that his doctor wanted him to come in to see Dr. Janee Morn because he has acid reflux. I advised the patient that he needs to have an evaluation by a gastroenterologist in order to determine the cause of the reflux and determine if it is something that requires surgical intervention. Patient agreed.

## 2012-02-16 ENCOUNTER — Telehealth: Payer: Self-pay | Admitting: *Deleted

## 2012-02-16 NOTE — Telephone Encounter (Signed)
CALLED PATIENT LEFT VOICE MESSAGE FOR PATIENT RETURN CALL TO [facility].   NEED TO KNOW WHAT GI DR, THAT HE SAW IN THE PAST, Jordan Long NTII  02-16-12 11:53AM

## 2012-03-07 ENCOUNTER — Telehealth (INDEPENDENT_AMBULATORY_CARE_PROVIDER_SITE_OTHER): Payer: Self-pay | Admitting: General Surgery

## 2012-03-07 NOTE — Telephone Encounter (Signed)
Patient called in stating that he has another hernia that has developed near the prior one that was repaired (with mesh). Patient to see his gastroenterologist on 03/15/12 regarding his reflux. Patient tried to describe where the new one is. He said it was in the middle above the incision and to the right side. Patient requested to be contacted if there is a cancellation in order to be seen sooner. Patient scheduled to see Dr. Janee Morn on 04/06/12.

## 2012-03-08 ENCOUNTER — Telehealth (INDEPENDENT_AMBULATORY_CARE_PROVIDER_SITE_OTHER): Payer: Self-pay

## 2012-03-08 NOTE — Telephone Encounter (Signed)
I called and left the pt a message to call.  I want to offer him an appointment 03/23/2012 at 0920.  I scheduled it.  If he would like that appointment, we can cancel the one on 3/5.

## 2012-03-08 NOTE — Telephone Encounter (Signed)
I called the pt and gave him the appointment for 2/19.  I cancelled the one for 3/5

## 2012-03-10 ENCOUNTER — Encounter: Payer: Self-pay | Admitting: Internal Medicine

## 2012-03-15 ENCOUNTER — Telehealth: Payer: Self-pay | Admitting: Gastroenterology

## 2012-03-15 ENCOUNTER — Encounter: Payer: Self-pay | Admitting: Internal Medicine

## 2012-03-15 ENCOUNTER — Ambulatory Visit (INDEPENDENT_AMBULATORY_CARE_PROVIDER_SITE_OTHER): Payer: Managed Care, Other (non HMO) | Admitting: Internal Medicine

## 2012-03-15 VITALS — BP 113/74 | HR 80 | Temp 97.0°F | Ht 70.0 in | Wt 186.4 lb

## 2012-03-15 VITALS — BP 110/70 | HR 70 | Ht 67.5 in | Wt 184.6 lb

## 2012-03-15 DIAGNOSIS — K219 Gastro-esophageal reflux disease without esophagitis: Secondary | ICD-10-CM

## 2012-03-15 DIAGNOSIS — K432 Incisional hernia without obstruction or gangrene: Secondary | ICD-10-CM

## 2012-03-15 MED ORDER — SUCRALFATE 1 G PO TABS: 1.0000 g | ORAL_TABLET | Freq: Three times a day (TID) | ORAL | Status: DC

## 2012-03-15 MED ORDER — PANTOPRAZOLE SODIUM 40 MG PO TBEC: 40.0000 mg | DELAYED_RELEASE_TABLET | Freq: Every day | ORAL | Status: DC

## 2012-03-15 NOTE — Progress Notes (Signed)
Subjective:   Patient ID: Jordan Long male   DOB: 1951/01/18 62 y.o.   MRN: [id number]  HPI: [name] is a 62 y.o.  Male with PMH significant as outlined below who presented to the clinic for a follow up for reflux. He noted that he has been taking Tums, ranitidin currently . He tried Prilosec without any signficant improvement . Further noted that when he lies down on his back he has the feeling that there is a "mountain rising from his belly. He has pain when he presses on it . Denies nausea, vomiting, fevers or chills , diarrhea but noted that he BM has changed ( it is more small little BM which is unusual for him)     Past Medical History  Diagnosis Date  . DVT (deep venous thrombosis) 2010 and 10/2010    after Hernia repair in 2010 s/p IVC filter and coumadin. Next episode after Hernia repair  ( 10/23/2010) . Admitted on 11/02/2010 for repeat DVT. Warfarin was stopped for  5 days before surgery.   . Incisional hernia 08/20/2010  . Constipation 11/20/2010  . Headache    Current Outpatient Prescriptions  Medication Sig Dispense Refill  . acetaminophen (TYLENOL) 500 MG tablet Take 500 mg by mouth every 6 (six) hours as needed.        . Calcium Carb-Cholecalciferol (CALCIUM + D3) 600-200 MG-UNIT TABS Take 1 tablet by mouth daily.      . Multiple Vitamin (MULITIVITAMIN WITH MINERALS) TABS Take 1 tablet by mouth daily.      Marland Kitchen omeprazole (PRILOSEC) 40 MG capsule Take 1 capsule (40 mg total) by mouth daily.  30 capsule  1  . oxyCODONE-acetaminophen (ROXICET) 5-325 MG per tablet Take 1 tablet by mouth every 6 (six) hours as needed for pain.  30 tablet  0  . Rivaroxaban (XARELTO PO) Take 20 mg by mouth daily.       . traMADol (ULTRAM) 50 MG tablet Take 1 tablet (50 mg total) by mouth every 6 (six) hours as needed for pain.  60 tablet  0   No current facility-administered medications for this visit.   Family History  Problem Relation Age of Onset  . Cancer Father    Lymphoma   . Raynaud syndrome Father   . Diabetes Mother   . Parkinsonism Mother   . Thyroid disease Sister   . Diabetes Sister   . Raynaud syndrome Sister   . Lupus Sister   . Raynaud syndrome Sister    History   Social History  . Marital Status: Divorced    Spouse Name: N/A    Number of Children: N/A  . Years of Education: N/A   Social History Main Topics  . Smoking status: Former Smoker    Quit date: 09/23/1985  . Smokeless tobacco: Never Used  . Alcohol Use: No  . Drug Use: No  . Sexually Active: None   Other Topics Concern  . None   Social History Narrative  . None   Review of Systems: Constitutional: Denies fever, chills, diaphoresis, appetite change and fatigue.   Respiratory: Denies SOB, DOE, cough, chest tightness,  and wheezing.   Cardiovascular: Denies chest pain, palpitations and leg swelling.  Gastrointestinal: Denies nausea, vomiting,  diarrhea, constipation, blood in stool and abdominal distention.    Objective:  Physical Exam: Filed Vitals:   03/15/12 0859  BP: 113/74  Pulse: 80  Temp: 97 F (36.1 C)  TempSrc: Oral  Height: 5\' 10"  (1.778 m)  Weight: 186 lb 6.4 oz (84.55 kg)  SpO2: 97%   Constitutional: Vital signs reviewed.  Patient is a well-developed and well-nourished male in no acute distress and cooperative with exam. Alert and oriented x3.   Neck: Supple,  Cardiovascular: RRR, S1 normal, S2 normal, no MRG, pulses symmetric and intact bilaterally Pulmonary/Chest: CTAB, no wheezes, rales, or rhonchi Abdominal: Soft.  non-distended, bowel sounds are normal, mild tenderness to palpation . Scars present. I was able to feel a mobile mass which was mildly tender.  Neurological: A&O x3

## 2012-03-15 NOTE — Telephone Encounter (Signed)
03/15/2012    RE: Jordan Long DOB: [birth year] MRN: [id number]   Dear Dr. Arlyn Leak,    We have scheduled the above patient for an endoscopic procedure. Our records show that he is on anticoagulation therapy.   Please advise as to how long the patient may come off his therapy of Xarelto prior to the procedure, which is scheduled for 03/21/2012.  Please fax back/ or route the completed form to [place] or Aram Beecham at [phone].   Sincerely,  Adonis Housekeeper CMA-AAMA  For Dr. Erick Blinks

## 2012-03-15 NOTE — Progress Notes (Addendum)
SUBJECTIVE: HPI Jordan Long is a 62 year old male with a past medical history of GERD, DVT after hernia repair surgery, and headaches who seen in consultation at the request of Dr. Loistine Chance for evaluation of epigastric pain heartburn. Patient is accompanied today by his sister. He reports 2 months of severe heartburn on a daily basis. He reports that prior to 2 months ago he had intermittent heartburn, but worsened to a daily problem. He denies a long-standing history of heartburn. He does endorse water brash but no globus, dysphagia or odynophagia. He's not having nausea or vomiting. He does describe a burning epigastric pain which radiates into his chest. This is not necessarily worse after eating, but occasionally wakes him from sleep. He has tried Nexium without benefit, and is currently taking ranitidine 150 mg twice daily. He has a medication taken it 3 times a daily. He is frequently using TUMS. The TUMS and ranitidine together do provide some benefit but not complete relief. He reports normal bowel movements without blood in his stool or melena. He has never had an endoscopy or colonoscopy. No fevers or chills. Weight has been stable. He does take Xarelto her history of DVT  Review of Systems  As per history of present illness, otherwise negative   Past Medical History  Diagnosis Date  . DVT (deep venous thrombosis) 2010 and 10/2010    after Hernia repair in 2010 s/p IVC filter and coumadin. Next episode after Hernia repair  ( 10/23/2010) . Admitted on 11/02/2010 for repeat DVT. Warfarin was stopped for  5 days before surgery.   . Incisional hernia 08/20/2010  . Constipation 11/20/2010  . Headache   . GERD (gastroesophageal reflux disease)     Current Outpatient Prescriptions  Medication Sig Dispense Refill  . acetaminophen (TYLENOL) 500 MG tablet Take 500 mg by mouth every 6 (six) hours as needed.        . Calcium Carb-Cholecalciferol (CALCIUM + D3) 600-200 MG-UNIT TABS Take 1 tablet by  mouth daily.      . calcium carbonate (TUMS - DOSED IN MG ELEMENTAL CALCIUM) 500 MG chewable tablet Chew by mouth daily. Many every day      . Multiple Vitamin (MULITIVITAMIN WITH MINERALS) TABS Take 1 tablet by mouth daily.      . Rivaroxaban (XARELTO PO) Take 20 mg by mouth daily.       . pantoprazole (PROTONIX) 40 MG tablet Take 1 tablet (40 mg total) by mouth daily.  90 tablet  3  . sucralfate (CARAFATE) 1 G tablet Take 1 tablet (1 g total) by mouth 4 (four) times daily -  before meals and at bedtime.  120 tablet  3   No current facility-administered medications for this visit.    No Known Allergies  Family History  Problem Relation Age of Onset  . Cancer Father     Lymphoma   . Raynaud syndrome Father   . Diabetes Mother   . Parkinsonism Mother   . Thyroid disease Sister   . Diabetes Sister   . Raynaud syndrome Sister   . Lupus Sister   . Raynaud syndrome Sister   . Colon cancer Neg Hx   . Heart disease Mother   . Heart attack      neice    History  Substance Use Topics  . Smoking status: Former Smoker    Quit date: 09/23/1985  . Smokeless tobacco: Never Used  . Alcohol Use: No    OBJECTIVE: BP 110/70  Pulse 70  Ht 5' 7.5" (1.715 m)  Wt 184 lb 9.6 oz (83.734 kg)  BMI 28.47 kg/m2 Constitutional: Well-developed and well-nourished. No distress. HEENT: Normocephalic and atraumatic. Oropharynx is clear and moist. No oropharyngeal exudate. Conjunctivae are normal. No scleral icterus. Neck: Neck supple. Trachea midline. Cardiovascular: Normal rate, regular rhythm and intact distal pulses. No M/R/G Pulmonary/chest: Effort normal and breath sounds normal. No wheezing, rales or rhonchi. Abdominal: Soft, multiple well-healed incisions anteriorly, mild diffuse mid abdominal tenderness, nondistended. Bowel sounds active throughout.  Extremities: no clubbing, cyanosis, or edema Lymphadenopathy: No cervical adenopathy noted. Neurological: Alert and oriented to person place  and time. Skin: Skin is warm and dry. No rashes noted. Psychiatric: Normal mood and affect. Behavior is normal.  Labs and Imaging -- CBC    Component Value Date/Time   WBC 7.2 02/01/2012 1014   RBC 5.17 02/01/2012 1014   HGB 15.4 02/01/2012 1014   HCT 45.4 02/01/2012 1014   PLT 282 02/01/2012 1014   MCV 87.8 02/01/2012 1014   MCH 29.8 02/01/2012 1014   MCHC 33.9 02/01/2012 1014   RDW 13.4 02/01/2012 1014   LYMPHSABS 1.7 02/21/2011 1534   MONOABS 0.6 02/21/2011 1534   EOSABS 0.1 02/21/2011 1534   BASOSABS 0.0 02/21/2011 1534    CMP     Component Value Date/Time   NA 137 02/01/2012 1014   K 5.0 02/01/2012 1014   CL 103 02/01/2012 1014   CO2 27 02/01/2012 1014   GLUCOSE 92 02/01/2012 1014   BUN 19 02/01/2012 1014   CREATININE 1.27 02/01/2012 1014   CREATININE 1.12 02/22/2011 0500   CALCIUM 10.8* 02/01/2012 1014   PROT 7.2 02/01/2012 1014   ALBUMIN 4.6 02/01/2012 1014   AST 24 02/01/2012 1014   ALT 18 02/01/2012 1014   ALKPHOS 59 02/01/2012 1014   BILITOT 0.4 02/01/2012 1014   GFRNONAA 69* 02/22/2011 0500   GFRAA 80* 02/22/2011 0500    ASSESSMENT AND PLAN:  63 year old male with a past medical history of GERD, DVT after hernia repair surgery, and headaches who seen in consultation at the request of Dr. Loistine Chance for evaluation of epigastric pain heartburn.  1.  Heartburn/epigastric pain -- the patient's substernal chest burning and midepigastric abdominal pain did not respond to PPI and have continued despite H2 blocker and TUMS.  For this reason I recommended direct visualization with upper endoscopy to exclude esophagitis, gastritis, PUD, etc.  we discussed the test today and he is agreeable to proceed. I would like to start him on an PPI, which should suppress acid more completely. I will prescribe pantoprazole 40 mg twice daily. He's instructed to take this 30 minutes to one hour before his first and last meal of the day. I also will give him a prescription for sucralfate which  he can use 3 times a day a.c. and at bedtime. I would like him to start PPI first and only use the Carafate if necessary, thereafter.  Further recommendations to be made after endoscopy. With the PPI he should be able to discontinue ranitidine and TUMS, but he can use these medications for breakthrough, per box instructions only.  2.  CRC screening -- he has never had a colonoscopy and I recommended a colonoscopy today. Our plan is to address the upper GI symptoms first, and plan average risk screening colonoscopy in several months. There are no current alarm symptoms.  3.  History of prior abdominal surgery/abdominal hernia -- the patient has noted some burning pain in the area of his  prior surgical scar. He has a visit planned this week with Dr. Janee Morn.    Addendum --I spoke to Dr. Arlyn Leak, the patient's cardiologist regarding holding his Xarelto before his endoscopy.  Dr. Arlyn Leak recalls his history of DVT and subsequent PE which occurred during a period his anticoagulation was on hold.  Given this history, he recommends that if Xarelto needs to be held, he would need to be admitted with a heparin bridge.  With this in mind, I have discussed this at length today with Jordan Long.  We can perform his upper endoscopy without interruption in his anticoagulation, however biopsies or other therapeutic intervention would not be possible.  The risk of bleeding with an upper endoscopy, diagnostic only, is extremely low, and felt safe while on anticoagulation.  With this in mind we will proceed with the EGD as scheduled on Monday and he will remain on his anticoagulant. If intervention is deemed necessary, we will plan a hospital admission and his anticoagulation could be held and heparin bridge initiated.  Patient understands this recommendation and plan, and agrees to proceed.

## 2012-03-15 NOTE — Patient Instructions (Signed)
Occult blood test x3  02/10/2012 negative

## 2012-03-15 NOTE — Assessment & Plan Note (Signed)
Patient has an appointment with surgery on 03/24/11 to follow up on Hernia

## 2012-03-15 NOTE — Patient Instructions (Addendum)
You have been scheduled for an endoscopy with propofol. Please follow written instructions given to you at your visit today. If you use inhalers (even only as needed) or a CPAP machine, please bring them with you on the day of your procedure.   We have sent the following medications to your pharmacy for you to pick up at your convenience: Pantoprazole; take 1 30 minutes beofore the first meal of the day.  Carafate: take 1 tablet before each meal and 1 tablet before bedtime.  Discontinue Zantac and Tums, only take for breakthrough heartburn per directions on the box.

## 2012-03-15 NOTE — Assessment & Plan Note (Signed)
Patient symptoms are not improved with Ranitidin and Tums. I think he did not give a good trial of Omeprazole. Nevertheless considering the length of his symptoms I will refer patient to GI. He has an appointment today

## 2012-03-16 ENCOUNTER — Encounter: Payer: Self-pay | Admitting: Internal Medicine

## 2012-03-21 ENCOUNTER — Ambulatory Visit (AMBULATORY_SURGERY_CENTER): Payer: Managed Care, Other (non HMO) | Admitting: Internal Medicine

## 2012-03-21 ENCOUNTER — Encounter: Payer: Self-pay | Admitting: Internal Medicine

## 2012-03-21 ENCOUNTER — Other Ambulatory Visit: Payer: Self-pay | Admitting: Gastroenterology

## 2012-03-21 ENCOUNTER — Other Ambulatory Visit (INDEPENDENT_AMBULATORY_CARE_PROVIDER_SITE_OTHER): Payer: Managed Care, Other (non HMO)

## 2012-03-21 VITALS — BP 106/64 | HR 60 | Temp 98.3°F | Resp 13 | Ht 67.5 in | Wt 184.0 lb

## 2012-03-21 DIAGNOSIS — K219 Gastro-esophageal reflux disease without esophagitis: Secondary | ICD-10-CM

## 2012-03-21 DIAGNOSIS — K2100 Gastro-esophageal reflux disease with esophagitis, without bleeding: Secondary | ICD-10-CM

## 2012-03-21 LAB — H. PYLORI ANTIBODY, IGG: H Pylori IgG: NEGATIVE

## 2012-03-21 MED ORDER — SUCRALFATE 1 GM/10ML PO SUSP: 1.0000 g | Freq: Four times a day (QID) | ORAL | Status: DC

## 2012-03-21 MED ORDER — SODIUM CHLORIDE 0.9 % IV SOLN: 500.0000 mL | INTRAVENOUS | Status: DC

## 2012-03-21 NOTE — Progress Notes (Signed)
Patient did not experience any of the following events: a burn prior to discharge; a fall within the facility; wrong site/side/patient/procedure/implant event; or a hospital transfer or hospital admission upon discharge from the facility. (G8907) Patient did not have preoperative order for IV antibiotic SSI prophylaxis. (G8918)  

## 2012-03-21 NOTE — Patient Instructions (Addendum)
YOU HAD AN ENDOSCOPIC PROCEDURE TODAY AT THE [facility]: Refer to the procedure report that was given to you for any specific questions about what was found during the examination.  If the procedure report does not answer your questions, please call your gastroenterologist to clarify.  If you requested that your care partner not be given the details of your procedure findings, then the procedure report has been included in a sealed envelope for you to review at your convenience later.  YOU SHOULD EXPECT: Some feelings of bloating in the abdomen. Passage of more gas than usual.  Walking can help get rid of the air that was put into your GI tract during the procedure and reduce the bloating. If you had a lower endoscopy (such as a colonoscopy or flexible sigmoidoscopy) you may notice spotting of blood in your stool or on the toilet paper. If you underwent a bowel prep for your procedure, then you may not have a normal bowel movement for a few days.  DIET: Your first meal following the procedure should be a light meal and then it is ok to progress to your normal diet.  A half-sandwich or bowl of soup is an example of a good first meal.  Heavy or fried foods are harder to digest and may make you feel nauseous or bloated.  Likewise meals heavy in dairy and vegetables can cause extra gas to form and this can also increase the bloating.  Drink plenty of fluids but you should avoid alcoholic beverages for 24 hours.  ACTIVITY: Your care partner should take you home directly after the procedure.  You should plan to take it easy, moving slowly for the rest of the day.  You can resume normal activity the day after the procedure however you should NOT DRIVE or use heavy machinery for 24 hours (because of the sedation medicines used during the test).    SYMPTOMS TO REPORT IMMEDIATELY: A gastroenterologist can be reached at any hour.  During normal business hours, 8:30 AM to 5:00 PM Monday through Friday,  call [phone].  After hours and on weekends, please call the GI answering service at [phone] who will take a message and have the physician on call contact you.    Following upper endoscopy (EGD)  Vomiting of blood or coffee ground material  New chest pain or pain under the shoulder blades  Painful or persistently difficult swallowing  New shortness of breath  Fever of 100F or higher  Black, tarry-looking stools  FOLLOW UP: If any biopsies were taken you will be contacted by phone or by letter within the next 1-3 weeks.  Call your gastroenterologist if you have not heard about the biopsies in 3 weeks.  Our staff will call the home number listed on your records the next business day following your procedure to check on you and address any questions or concerns that you may have at that time regarding the information given to you following your procedure. This is a courtesy call and so if there is no answer at the home number and we have not heard from you through the emergency physician on call, we will assume that you have returned to your regular daily activities without incident.  SIGNATURES/CONFIDENTIALITY: You and/or your care partner have signed paperwork which will be entered into your electronic medical record.  These signatures attest to the fact that that the information above on your After Visit Summary has been reviewed and is understood.  Full   responsibility of the confidentiality of this discharge information lies with you and/or your care-partner.   Resume medications. Information given on esophagitis and hiatal hernia with discharge instructions. 

## 2012-03-21 NOTE — Op Note (Signed)
[facility] [address] [place], [address]   ENDOSCOPY PROCEDURE REPORT  PATIENT: [name], [name]  MR#: [id number] BIRTHDATE: 1950/12/17 , 61  yrs. old GENDER: Male ENDOSCOPIST: Beverley Fiedler, MD REFERRED BY:  Almyra Deforest, MD PROCEDURE DATE:  03/21/2012 PROCEDURE:  EGD, diagnostic ASA CLASS:     Class III INDICATIONS:  Heartburn.   Epigastric pain. MEDICATIONS: MAC sedation, administered by CRNA and propofol (Diprivan) 300mg  IV TOPICAL ANESTHETIC: Cetacaine Spray  DESCRIPTION OF PROCEDURE: After the risks benefits and alternatives of the procedure were thoroughly explained, informed consent was obtained.  The LB [id number]-H180 [id number] endoscope was introduced through the mouth and advanced to the second portion of the duodenum. Without limitations.  The instrument was slowly withdrawn as the mucosa was fully examined.     ESOPHAGUS: There was LA Class C esophagitis noted.   Inlet patch at 18 cm from the incisors, benign in appearance.   A 3 cm hiatal hernia was noted.  STOMACH: Small erosion was found in the cardia, at diaphragmatic hiatus (Cameron's erosion).   The stomach otherwise appeared normal.  DUODENUM: The duodenal mucosa showed no abnormalities in the bulb and second portion of the duodenum.  Retroflexed views revealed a hiatal hernia.     The scope was then withdrawn from the patient and the procedure completed.  COMPLICATIONS: There were no complications.  ENDOSCOPIC IMPRESSION: 1.   There was LA Class C esophagitis noted 2.   Inlet patch at 18 cm from the incisors, benign in appearance 3.   3 cm hiatal hernia 4.   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   The stomach otherwise appeared normal 6.   The duodenal mucosa showed no abnormalities in the bulb and second portion of the duodenum    RECOMMENDATIONS: 1.  Continue pantoprazole 40 mg twice daily for at least 8 weeks given esophagitis 2.  Add Carafate 1 g liquid 3  times a day before meals and at bedtime 3.  Perform H.  pylori serum antibody, treat if positive 4.  Office visit in about 6 weeks  eSigned:  Beverley Fiedler, MD 03/21/2012 3:06 PM   CC: The Patient; Dr. Loistine Chance, MD; Dr. Arlyn Leak, MD  PATIENT NAME:  [name], [name] MR#: [id number]

## 2012-03-22 ENCOUNTER — Telehealth: Payer: Self-pay | Admitting: *Deleted

## 2012-03-22 NOTE — Telephone Encounter (Signed)
No answer, message left for the patient. 

## 2012-03-23 ENCOUNTER — Encounter (INDEPENDENT_AMBULATORY_CARE_PROVIDER_SITE_OTHER): Payer: Self-pay | Admitting: General Surgery

## 2012-03-23 ENCOUNTER — Ambulatory Visit (INDEPENDENT_AMBULATORY_CARE_PROVIDER_SITE_OTHER): Payer: Managed Care, Other (non HMO) | Admitting: General Surgery

## 2012-03-23 VITALS — BP 132/80 | HR 65 | Temp 98.3°F | Resp 16 | Ht 70.0 in | Wt 188.8 lb

## 2012-03-23 DIAGNOSIS — K432 Incisional hernia without obstruction or gangrene: Secondary | ICD-10-CM

## 2012-03-23 NOTE — Patient Instructions (Signed)
Call sooner if you have any change in symptoms

## 2012-03-23 NOTE — Progress Notes (Signed)
Subjective:     Patient ID: Jordan Long, male   DOB: [birth year], 61 y.o.   MRN: [id number]  HPI Patient is status post open incisional hernia repair with mesh and component separation. He has progressed well from that. He's had some neurologic and orthopedic issues. He has gone on disability. He developed some left upper quadrant abdominal discomfort and intermittent bulging. This is above and lateral to his previous incision area. He is eating and moving his bowels without difficulty.  Review of Systems     Objective:   Physical Exam  Constitutional: He is oriented to person, place, and time. He appears well-developed and well-nourished. No distress.  HENT:  Head: Normocephalic and atraumatic.  Mouth/Throat: No oropharyngeal exudate.  Eyes: EOM are normal. Pupils are equal, round, and reactive to light.  Neck: Normal range of motion. No tracheal deviation present. No thyromegaly present.  Cardiovascular: Normal rate and intact distal pulses.   Pulmonary/Chest: Effort normal and breath sounds normal. No stridor. No respiratory distress. He has no wheezes. He has no rales.  Abdominal: Soft. Bowel sounds are normal. He exhibits no distension. There is no tenderness. There is no rebound and no guarding.    Neurological: He is alert and oriented to person, place, and time.       Assessment:     New incisional hernia above and lateral to previous repair. This easily reduces, often spontaneously.    Plan:     We discussed laparoscopic repair of this new incisional hernia. We discussed procedure and risks. He would like to hold off on surgery at this point. He is agreed to see me in 3 months. We will discuss things again at that time. He is agreed to call in the interim if symptoms worsen. He is aware of signs and symptoms of incarceration and strangulation.

## 2012-04-06 ENCOUNTER — Encounter (INDEPENDENT_AMBULATORY_CARE_PROVIDER_SITE_OTHER): Payer: Managed Care, Other (non HMO) | Admitting: General Surgery

## 2012-04-15 ENCOUNTER — Telehealth: Payer: Self-pay | Admitting: *Deleted

## 2012-04-15 NOTE — Telephone Encounter (Signed)
lmom for pt to call back to schedule an appt.

## 2012-04-22 ENCOUNTER — Other Ambulatory Visit: Payer: Self-pay | Admitting: *Deleted

## 2012-05-06 ENCOUNTER — Ambulatory Visit: Payer: Managed Care, Other (non HMO) | Admitting: Internal Medicine

## 2012-05-10 NOTE — Telephone Encounter (Signed)
Patient did schedule an office visit for 05/06/12, but he cancelled it.

## 2012-06-08 ENCOUNTER — Encounter: Payer: Self-pay | Admitting: Internal Medicine

## 2012-06-08 ENCOUNTER — Encounter: Payer: Managed Care, Other (non HMO) | Admitting: Internal Medicine

## 2012-06-08 ENCOUNTER — Ambulatory Visit (INDEPENDENT_AMBULATORY_CARE_PROVIDER_SITE_OTHER): Payer: Managed Care, Other (non HMO) | Admitting: Internal Medicine

## 2012-06-08 VITALS — BP 116/79 | HR 68 | Temp 98.2°F | Ht 70.0 in | Wt 184.4 lb

## 2012-06-08 DIAGNOSIS — Z1211 Encounter for screening for malignant neoplasm of colon: Secondary | ICD-10-CM

## 2012-06-08 DIAGNOSIS — Z86718 Personal history of other venous thrombosis and embolism: Secondary | ICD-10-CM

## 2012-06-08 DIAGNOSIS — IMO0002 Reserved for concepts with insufficient information to code with codable children: Secondary | ICD-10-CM

## 2012-06-08 DIAGNOSIS — K432 Incisional hernia without obstruction or gangrene: Secondary | ICD-10-CM

## 2012-06-08 DIAGNOSIS — I82401 Acute embolism and thrombosis of unspecified deep veins of right lower extremity: Secondary | ICD-10-CM

## 2012-06-08 DIAGNOSIS — M549 Dorsalgia, unspecified: Secondary | ICD-10-CM

## 2012-06-08 DIAGNOSIS — T148XXA Other injury of unspecified body region, initial encounter: Secondary | ICD-10-CM

## 2012-06-08 NOTE — Progress Notes (Signed)
Subjective:   Patient ID: Jordan Long male   DOB: 20-Jul-1950 62 y.o.   MRN: [id number]  HPI: [name] is a 62 y.o. male with past medical history significant as outlined below who presented to the clinic for regular followup.patient reports he has been seen by surgery concerning his hernia recommended laparoscopy take repair of new incisional hernia. Patient at this point is reluctant to proceed with any further surgical intervention. He noted that he is somewhat sad since he's not able to do things what he was doing. He was a very active and athletic percent occluded do weight lifting running which he is not able to do anymore. Furthermore his patient reports knee pain which somewhat controlled with steroid injection.    Past Medical History  Diagnosis Date  . DVT (deep venous thrombosis) 2010 and 10/2010    after Hernia repair in 2010 s/p IVC filter and coumadin. Next episode after Hernia repair  ( 10/23/2010) . Admitted on 11/02/2010 for repeat DVT. Warfarin was stopped for  5 days before surgery.   . Incisional hernia 08/20/2010  . Constipation 11/20/2010  . Headache   . GERD (gastroesophageal reflux disease)    Current Outpatient Prescriptions  Medication Sig Dispense Refill  . acetaminophen (TYLENOL) 500 MG tablet Take 500 mg by mouth every 6 (six) hours as needed.        . Calcium Carb-Cholecalciferol (CALCIUM + D3) 600-200 MG-UNIT TABS Take 1 tablet by mouth daily.      . calcium carbonate (TUMS - DOSED IN MG ELEMENTAL CALCIUM) 500 MG chewable tablet Chew by mouth daily. Many every day      . Multiple Vitamin (MULITIVITAMIN WITH MINERALS) TABS Take 1 tablet by mouth daily.      . pantoprazole (PROTONIX) 40 MG tablet Take 1 tablet (40 mg total) by mouth daily.  90 tablet  3  . Rivaroxaban (XARELTO PO) Take 20 mg by mouth daily.       . sucralfate (CARAFATE) 1 GM/10ML suspension Take 10 mLs (1 g total) by mouth 4 (four) times daily.  420 mL  2   No current  facility-administered medications for this visit.   Family History  Problem Relation Age of Onset  . Cancer Father     Lymphoma   . Raynaud syndrome Father   . Diabetes Mother   . Parkinsonism Mother   . Thyroid disease Sister   . Diabetes Sister   . Raynaud syndrome Sister   . Lupus Sister   . Raynaud syndrome Sister   . Colon cancer Neg Hx   . Heart disease Mother   . Heart attack      neice   History   Social History  . Marital Status: Divorced    Spouse Name: N/A    Number of Children: 2  . Years of Education: N/A   Occupational History  . retired [employer]   Social History Main Topics  . Smoking status: Former Smoker    Quit date: 09/23/1985  . Smokeless tobacco: Never Used  . Alcohol Use: No  . Drug Use: No  . Sexually Active: None   Other Topics Concern  . None   Social History Narrative  . None   Review of Systems: Constitutional: Denies fever, chills, diaphoresis, appetite change and fatigue.  Respiratory: Denies SOB, DOE, cough, chest tightness,  and wheezing.   Cardiovascular: Denies chest pain, palpitations and leg swelling.  Gastrointestinal: Denies nausea, vomiting,diarrhea, constipation, blood in stool but noted  occasionally abdominal pain  Genitourinary: Denies dysuria, urgency, frequency, hematuria, flank pain and difficulty urinating.  Musculoskeletal: Noted  arthralgias   Skin: Denies pallor, rash and wound.  Neurological: Denies dizziness,  weakness, light-headedness, numbness and headaches.    Objective:  Physical Exam: Filed Vitals:   06/08/12 1419  BP: 116/79  Pulse: 68  Temp: 98.2 F (36.8 C)  TempSrc: Oral  Height: 5\' 10"  (1.778 m)  Weight: 184 lb 6.4 oz (83.643 kg)  SpO2: 95%   Constitutional: Vital signs reviewed.  Patient is a well-developed and well-nourished  Male  in no acute distress and cooperative with exam. Alert and oriented x3.  Eyes: PERRL, EOMI, conjunctivae normal, No scleral icterus.  Neck: Supple,    Cardiovascular: RRR, S1 normal, S2 normal, no MRG, pulses symmetric and intact bilaterally Pulmonary/Chest: CTAB, no wheezes, rales, or rhonchi Abdominal: Soft. Mild  Tenderness with  palpation , non-distended, bowel sounds are normal Hematology: no cervical adenopathy.  Neurological: A&O x3, Strength is normal and symmetric bilaterally, no focal motor deficit, sensory intact to light touch bilaterally.  Skin: Warm, dry and intact. No rash, cyanosis, or clubbing.

## 2012-06-08 NOTE — Assessment & Plan Note (Addendum)
After discussion with Dr Kem Kays will proceed with upep and spep as well as PTH to evaluate further of compression fracture. Will obtain Bmet since patient was noted to have elevated Calcium

## 2012-06-09 LAB — CBC
HCT: 42.3 % (ref 39.0–52.0)
Hemoglobin: 14.4 g/dL (ref 13.0–17.0)
MCH: 29.7 pg (ref 26.0–34.0)
MCV: 87.2 fL (ref 78.0–100.0)
RBC: 4.85 MIL/uL (ref 4.22–5.81)

## 2012-06-09 LAB — BASIC METABOLIC PANEL
Chloride: 105 mEq/L (ref 96–112)
Creat: 1.37 mg/dL — ABNORMAL HIGH (ref 0.50–1.35)
Potassium: 5.2 mEq/L (ref 3.5–5.3)
Sodium: 140 mEq/L (ref 135–145)

## 2012-06-09 NOTE — Progress Notes (Signed)
Case discussed with Dr. Illath immediately after the resident saw the patient. We reviewed the resident's history and exam and pertinent patient test results. I agree with the assessment, diagnosis and plan of care documented in the resident's note. 

## 2012-06-10 ENCOUNTER — Other Ambulatory Visit (INDEPENDENT_AMBULATORY_CARE_PROVIDER_SITE_OTHER): Payer: Managed Care, Other (non HMO)

## 2012-06-10 DIAGNOSIS — X58XXXA Exposure to other specified factors, initial encounter: Secondary | ICD-10-CM

## 2012-06-10 DIAGNOSIS — T148XXA Other injury of unspecified body region, initial encounter: Secondary | ICD-10-CM

## 2012-06-14 ENCOUNTER — Encounter: Payer: Self-pay | Admitting: Internal Medicine

## 2012-06-14 LAB — PROTEIN ELECTROPHORESIS, SERUM
Albumin ELP: 60.6 % (ref 55.8–66.1)
Alpha-1-Globulin: 3.6 % (ref 2.9–4.9)
Beta 2: 6 % (ref 3.2–6.5)
Beta Globulin: 7.1 % (ref 4.7–7.2)

## 2012-06-14 LAB — PROTEIN ELECTROPHORESIS, URINE REFLEX: Total Protein, Urine: <3 mg/dL

## 2012-06-22 ENCOUNTER — Other Ambulatory Visit: Payer: Managed Care, Other (non HMO)

## 2012-06-29 ENCOUNTER — Ambulatory Visit (INDEPENDENT_AMBULATORY_CARE_PROVIDER_SITE_OTHER): Payer: Managed Care, Other (non HMO) | Admitting: Surgery

## 2012-06-29 ENCOUNTER — Encounter (INDEPENDENT_AMBULATORY_CARE_PROVIDER_SITE_OTHER): Payer: Self-pay

## 2012-06-29 ENCOUNTER — Encounter (INDEPENDENT_AMBULATORY_CARE_PROVIDER_SITE_OTHER): Payer: Self-pay | Admitting: Surgery

## 2012-06-29 VITALS — BP 126/76 | HR 60 | Temp 98.2°F | Resp 18 | Ht 70.0 in | Wt 183.0 lb

## 2012-06-29 DIAGNOSIS — K4021 Bilateral inguinal hernia, without obstruction or gangrene, recurrent: Secondary | ICD-10-CM | POA: Insufficient documentation

## 2012-06-29 DIAGNOSIS — K4091 Unilateral inguinal hernia, without obstruction or gangrene, recurrent: Secondary | ICD-10-CM

## 2012-06-29 DIAGNOSIS — K432 Incisional hernia without obstruction or gangrene: Secondary | ICD-10-CM

## 2012-06-29 NOTE — Patient Instructions (Signed)
See the Handout(s) we gave you.  Consider Laparoscopic surgery to repair the hernias in your upper abdomen and left groin.    We would like your cardiologist, Dr. Donnie Aho, to make sure it safe to come off the [facility] blood thinner before proceeding with surgery.  Please call our office at [phone] if you wish to schedule surgery or if you have further questions / concerns.   Hernia A hernia occurs when an internal organ pushes out through a weak spot in the abdominal wall. Hernias most commonly occur in the groin and around the navel. Hernias often can be pushed back into place (reduced). Most hernias tend to get worse over time. Some abdominal hernias can get stuck in the opening (irreducible or incarcerated hernia) and cannot be reduced. An irreducible abdominal hernia which is tightly squeezed into the opening is at risk for impaired blood supply (strangulated hernia). A strangulated hernia is a medical emergency. Because of the risk for an irreducible or strangulated hernia, surgery may be recommended to repair a hernia. CAUSES   Heavy lifting.  Prolonged coughing.  Straining to have a bowel movement.  A cut (incision) made during an abdominal surgery. HOME CARE INSTRUCTIONS   Bed rest is not required. You may continue your normal activities.  Avoid lifting more than 10 pounds (4.5 kg) or straining.  Cough gently. If you are a smoker it is best to stop. Even the best hernia repair can break down with the continual strain of coughing. Even if you do not have your hernia repaired, a cough will continue to aggravate the problem.  Do not wear anything tight over your hernia. Do not try to keep it in with an outside bandage or truss. These can damage abdominal contents if they are trapped within the hernia sac.  Eat a normal diet.  Avoid constipation. Straining over long periods of time will increase hernia size and encourage breakdown of repairs. If you cannot do this with diet  alone, stool softeners may be used. SEEK IMMEDIATE MEDICAL CARE IF:   You have a fever.  You develop increasing abdominal pain.  You feel nauseous or vomit.  Your hernia is stuck outside the abdomen, looks discolored, feels hard, or is tender.  You have any changes in your bowel habits or in the hernia that are unusual for you.  You have increased pain or swelling around the hernia.  You cannot push the hernia back in place by applying gentle pressure while lying down. MAKE SURE YOU:   Understand these instructions.  Will watch your condition.  Will get help right away if you are not doing well or get worse. Document Released: 01/19/2005 Document Revised: 04/13/2011 Document Reviewed: 09/08/2007 [facility] Patient Information 2014 [place], [place].  HERNIA REPAIR: POST OP INSTRUCTIONS  1. DIET: Follow a light bland diet the first 24 hours after arrival home, such as soup, liquids, crackers, etc.  Be sure to include lots of fluids daily.  Avoid fast food or heavy meals as your are more likely to get nauseated.  Eat a low fat the next few days after surgery. 2. Take your usually prescribed home medications unless otherwise directed. 3. PAIN CONTROL: a. Pain is best controlled by a usual combination of three different methods TOGETHER: i. Ice/Heat ii. Over the counter pain medication iii. Prescription pain medication b. Most patients will experience some swelling and bruising around the hernia(s) such as the bellybutton, groins, or old incisions.  Ice packs or heating pads (30-60 minutes  up to 6 times a day) will help. Use ice for the first few days to help decrease swelling and bruising, then switch to heat to help relax tight/sore spots and speed recovery.  Some people prefer to use ice alone, heat alone, alternating between ice & heat.  Experiment to what works for you.  Swelling and bruising can take several weeks to resolve.   c. It is helpful to take an over-the-counter pain  medication regularly for the first few weeks.  Choose one of the following that works best for you: i. Naproxen (Aleve, etc)  Two 220mg  tabs twice a day ii. Ibuprofen (Advil, etc) Three 200mg  tabs four times a day (every meal & bedtime) iii. Acetaminophen (Tylenol, etc) 325-650mg  four times a day (every meal & bedtime) d. A  prescription for pain medication should be given to you upon discharge.  Take your pain medication as prescribed.  i. If you are having problems/concerns with the prescription medicine (does not control pain, nausea, vomiting, rash, itching, etc), please call us [phone] to see if we need to switch you to a different pain medicine that will work better for you and/or control your side effect better. ii. If you need a refill on your pain medication, please contact your pharmacy.  They will contact our office to request authorization. Prescriptions will not be filled after 5 pm or on week-ends. 4. Avoid getting constipated.  Between the surgery and the pain medications, it is common to experience some constipation.  Increasing fluid intake and taking a fiber supplement (such as Metamucil, Citrucel, FiberCon, MiraLax, etc) 1-2 times a day regularly will usually help prevent this problem from occurring.  A mild laxative (prune juice, Milk of Magnesia, MiraLax, etc) should be taken according to package directions if there are no bowel movements after 48 hours.   5. Wash / shower every day.  You may shower over the dressings as they are waterproof.   6. Remove your waterproof bandages 5 days after surgery.  You may leave the incision open to air.  You may replace a dressing/Band-Aid to cover the incision for comfort if you wish.  Continue to shower over incision(s) after the dressing is off.    7. ACTIVITIES as tolerated:   a. You may resume regular (light) daily activities beginning the next day-such as daily self-care, walking, climbing stairs-gradually increasing activities as  tolerated.  If you can walk 30 minutes without difficulty, it is safe to try more intense activity such as jogging, treadmill, bicycling, low-impact aerobics, swimming, etc. b. Save the most intensive and strenuous activity for last such as sit-ups, heavy lifting, contact sports, etc  Refrain from any heavy lifting or straining until you are off narcotics for pain control.   c. DO NOT PUSH THROUGH PAIN.  Let pain be your guide: If it hurts to do something, don't do it.  Pain is your body warning you to avoid that activity for another week until the pain goes down. d. You may drive when you are no longer taking prescription pain medication, you can comfortably wear a seatbelt, and you can safely maneuver your car and apply brakes. e. Dennis Bast may have sexual intercourse when it is comfortable.  8. FOLLOW UP in our office a. Please call [facility] at [phone] to set up an appointment to see your surgeon in the office for a follow-up appointment approximately 2-3 weeks after your surgery. b. Make sure that you call for this appointment the day you  arrive home to insure a convenient appointment time. 9.  IF YOU HAVE DISABILITY OR FAMILY LEAVE FORMS, BRING THEM TO THE OFFICE FOR PROCESSING.  DO NOT GIVE THEM TO YOUR DOCTOR.  WHEN TO CALL us [phone]: 1. Poor pain control 2. Reactions / problems with new medications (rash/itching, nausea, etc)  3. Fever over 101.5 F (38.5 C) 4. Inability to urinate 5. Nausea and/or vomiting 6. Worsening swelling or bruising 7. Continued bleeding from incision. 8. Increased pain, redness, or drainage from the incision   The clinic staff is available to answer your questions during regular business hours (8:30am-5pm).  Please don't hesitate to call and ask to speak to one of our nurses for clinical concerns.   If you have a medical emergency, go to the nearest emergency room or call 911.  A surgeon from [facility] is always on call at the hospitals in  [facility], [place] [address], [place], [place]  [address] ?  [address] [address], [place], [place]   [address] MAIN: [phone] ? TOLL FREE: [phone] ? FAX: [phone] [email or web address]

## 2012-06-29 NOTE — Progress Notes (Signed)
Subjective:     Patient ID: Jordan Thrush., male   DOB: 03-Jun-1950, 62 y.o.   MRN: [id number]  HPI  Jordan Long  1950/08/17 [id number]  Patient Care Team: Almyra Deforest, MD as PCP - General (Internal Medicine) Othella Boyer, MD as Consulting Physician (Cardiology)  This patient is a 61 y.o.male who presents today for surgical evaluation at the request of Dr. Violeta Gelinas.   Reason for visit: Recurrent hernias on abdomen and left groin  Pleasant gentleman.  Prior open left inguinal hernia repair.  Bowel obstruction.  Laparotomy.  Drainage of abscess.  Developed incisional hernia.  Required open repair with component separation mesh in 2012 by Dr. Janee Morn.  Gradually recovered.  Developed small hernia at the apex of the incision earlier this year.  Not particularly symptomatic.  However, now he notes left groin pain and a bulge there.  The hernia in the upper abdomen is becoming symptomatic as well.  Dr. Janee Morn sent the patient to me for evaluation to see if a laparoscopic approach is possible given prior open repair.  Patient can walk 20 minutes slowly given his right knee arthralgias.  No history of cardiac symptoms.  History of DVT on chronic anticoagulation.  Does not smoke.  Daily bowel movements.  No recent infections.  No wound infections.  Interested in proceeding with surgery.   No exertional chest/neck/shoulder/arm pain.  Patient can walk 20 minutes for about 1/2 miles without difficulty.    Patient Active Problem List   Diagnosis Date Noted  . Recurrent left inguinal hernia 06/29/2012  . Colon cancer screening 02/01/2012  . Right knee pain 09/14/2011  . Compression fracture 08/17/2011  . Back pain 07/13/2011  . Knee pain, bilateral 07/13/2011  . GERD (gastroesophageal reflux disease) 07/13/2011  . Anxiety 03/02/2011  . Postural dizziness 03/02/2011  . Numbness and tingling in hands 02/22/2011  . 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 DVT (deep venous thrombosis)  11/20/2010  . Constipation 11/20/2010  . Incisional hernia 08/20/2010    Past Medical History  Diagnosis Date  . DVT (deep venous thrombosis) 2010 and 10/2010    after Hernia repair in 2010 s/p IVC filter and coumadin. Next episode after Hernia repair  ( 10/23/2010) . Admitted on 11/02/2010 for repeat DVT. Warfarin was stopped for  5 days before surgery.   . Incisional hernia 08/20/2010  . Constipation 11/20/2010  . Headache(784.0)   . GERD (gastroesophageal reflux disease)     Past Surgical History  Procedure Laterality Date  . Tonsillectomy    . Hernia repair  2010  . Hernia repair  10/23/2010    Incisional hernia w/mesh  . Hernia repair    . Appendectomy    . Septic abdominal surgery    . Knee surgery Right 10/2011    History   Social History  . Marital Status: Divorced    Spouse Name: N/A    Number of Children: 2  . Years of Education: N/A   Occupational History  . retired [employer]   Social History Main Topics  . Smoking status: Former Smoker    Quit date: 09/23/1985  . Smokeless tobacco: Never Used  . Alcohol Use: No  . Drug Use: No  . Sexually Active: Not on file   Other Topics Concern  . Not on file   Social History Narrative  . No narrative on file    Family History  Problem Relation Age of Onset  . Cancer Father     Lymphoma   .  Raynaud syndrome Father   . Diabetes Mother   . Parkinsonism Mother   . Thyroid disease Sister   . Diabetes Sister   . Raynaud syndrome Sister   . Lupus Sister   . Raynaud syndrome Sister   . Colon cancer Neg Hx   . Heart disease Mother   . Heart attack      neice    Current Outpatient Prescriptions  Medication Sig Dispense Refill  . acetaminophen (TYLENOL) 500 MG tablet Take 500 mg by mouth every 6 (six) hours as needed.        . Calcium Carb-Cholecalciferol (CALCIUM + D3) 600-200 MG-UNIT TABS Take 1 tablet by mouth daily.      . Multiple Vitamin (MULITIVITAMIN WITH MINERALS) TABS Take 1 tablet by mouth  daily.      . pantoprazole (PROTONIX) 40 MG tablet Take 1 tablet (40 mg total) by mouth daily.  90 tablet  3  . Rivaroxaban (XARELTO PO) Take 20 mg by mouth daily.       . sucralfate (CARAFATE) 1 GM/10ML suspension Take 10 mLs (1 g total) by mouth 4 (four) times daily.  420 mL  2   No current facility-administered medications for this visit.     No Known Allergies  BP 126/76  Pulse 60  Temp(Src) 98.2 F (36.8 C)  Resp 18  Ht 5\' 10"  (1.778 m)  Wt 183 lb (83.008 kg)  BMI 26.26 kg/m2  No results found.   Review of Systems  Constitutional: Negative for fever, chills and diaphoresis.  HENT: Negative for nosebleeds, sore throat, facial swelling, mouth sores, trouble swallowing and ear discharge.   Eyes: Negative for photophobia, discharge and visual disturbance.  Respiratory: Negative for choking, chest tightness, shortness of breath and stridor.   Cardiovascular: Negative for chest pain and palpitations.  Gastrointestinal: Positive for abdominal pain. Negative for nausea, vomiting, diarrhea, constipation, blood in stool, abdominal distention, anal bleeding and rectal pain.  Endocrine: Negative for cold intolerance and heat intolerance.  Genitourinary: Negative for dysuria, urgency, difficulty urinating and testicular pain.  Musculoskeletal: Positive for myalgias and arthralgias. Negative for back pain and gait problem.  Skin: Negative for color change, pallor, rash and wound.  Allergic/Immunologic: Negative for environmental allergies and food allergies.  Neurological: Negative for dizziness, speech difficulty, weakness, numbness and headaches.  Hematological: Negative for adenopathy. Does not bruise/bleed easily.  Psychiatric/Behavioral: Negative for hallucinations, confusion and agitation.       Objective:   Physical Exam  Constitutional: He is oriented to person, place, and time. He appears well-developed and well-nourished. No distress.  HENT:  Head: Normocephalic.    Mouth/Throat: Oropharynx is clear and moist. No oropharyngeal exudate.  Eyes: Conjunctivae and EOM are normal. Pupils are equal, round, and reactive to light. No scleral icterus.  Neck: Normal range of motion. Neck supple. No tracheal deviation present.  Cardiovascular: Normal rate, regular rhythm and intact distal pulses.   Pulmonary/Chest: Effort normal and breath sounds normal. No respiratory distress.  Abdominal: Soft. He exhibits no distension. There is no tenderness. A hernia is present. Hernia confirmed positive in the ventral area and confirmed positive in the left inguinal area. Hernia confirmed negative in the right inguinal area.    Musculoskeletal: Normal range of motion. He exhibits no tenderness.  Soft brace around the right knee.  Lymphadenopathy:    He has no cervical adenopathy.       Right: No inguinal adenopathy present.       Left: No inguinal adenopathy present.  Neurological: He is alert and oriented to person, place, and time. No cranial nerve deficit. He exhibits normal muscle tone. Coordination normal.  Skin: Skin is warm and dry. No rash noted. He is not diaphoretic. No erythema. No pallor.  Psychiatric: He has a normal mood and affect. His behavior is normal. Judgment and thought content normal.       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  Recurrent left inguinal hernia status post prior open repair.    Plan:     I agree that he would benefit from surgery.  I think it is reasonable to start out laparoscopically and see if these can be repaired that way.  Probable TAPP approach for left inguinal hernia given prior open repair.  Possible open repairs.  He is interested in proceeding:  The anatomy & physiology of the abdominal wall and pelvic floor was discussed.  The pathophysiology of hernias in the inguinal and pelvic region was discussed.  Natural history  risks such as progressive enlargement, pain, incarceration & strangulation was discussed.   Contributors to complications such as smoking, obesity, diabetes, prior surgery, etc were discussed.    I feel the risks of no intervention will lead to serious problems that outweigh the operative risks; therefore, I recommended surgery to reduce and repair the hernia.  I explained laparoscopic techniques with possible need for an open approach.  I noted usual use of mesh to patch and/or buttress hernia repair  Risks such as bleeding, infection, abscess, need for further treatment, heart attack, death, and other risks were discussed.  I noted a good likelihood this will help address the problem.   Goals of post-operative recovery were discussed as well.  Possibility that this will not correct all symptoms was explained.  I stressed the importance of low-impact activity, aggressive pain control, avoiding constipation, & not pushing through pain to minimize risk of post-operative chronic pain or injury. Possibility of reherniation was discussed.  We will work to minimize complications.     An educational handout further explaining the pathology & treatment options was given as well.  Questions were answered.  The patient expresses understanding & wishes to proceed with surgery.  I am concerned about the health of the patient and the ability to tolerate the operation.  Therefore, we will request clearance by cardiology to better assess operative risk & see if a reevaluation, further workup, adjustment to medications, etc is needed.  Hopefully it is okay to come off his Ellin Goodie and that is all he needs.

## 2012-06-30 ENCOUNTER — Telehealth (INDEPENDENT_AMBULATORY_CARE_PROVIDER_SITE_OTHER): Payer: Self-pay

## 2012-06-30 NOTE — Telephone Encounter (Signed)
Called Jordan Long to notify him that Dr York Spaniel office is not clearing him from a cardiac standpoint. The Jordan Long needs to make an appt with Dr York Spaniel office. The Jordan Long will call to make his own appt with Dr Donnie Aho.

## 2012-08-22 ENCOUNTER — Encounter (INDEPENDENT_AMBULATORY_CARE_PROVIDER_SITE_OTHER): Payer: Self-pay | Admitting: Surgery

## 2012-08-22 ENCOUNTER — Ambulatory Visit (INDEPENDENT_AMBULATORY_CARE_PROVIDER_SITE_OTHER): Payer: Managed Care, Other (non HMO) | Admitting: Surgery

## 2012-08-22 VITALS — BP 124/84 | HR 68 | Temp 98.3°F | Resp 14 | Ht 70.0 in | Wt 179.4 lb

## 2012-08-22 DIAGNOSIS — K432 Incisional hernia without obstruction or gangrene: Secondary | ICD-10-CM

## 2012-08-22 DIAGNOSIS — K4091 Unilateral inguinal hernia, without obstruction or gangrene, recurrent: Secondary | ICD-10-CM

## 2012-08-22 NOTE — Progress Notes (Signed)
Subjective:     Patient ID: Jordan Long., male   DOB: 1950-11-14, 62 y.o.   MRN: [id number]  HPI   Jordan Long  01-27-1951 [id number]  Patient Care Team: Darden Palmer, MD as PCP - General (Internal Medicine) Othella Boyer, MD as Consulting Physician (Cardiology)  This patient is a 62 y.o.male who presents today for surgical evaluation at the request of Dr. Violeta Gelinas.   Reason for visit: Recurrent hernias on abdomen and left groin  Pleasant gentleman.  Prior open left inguinal hernia repair.  Bowel obstruction.  Laparotomy.  Drainage of abscess.  Developed incisional hernia.  Required open repair with component separation mesh in 2012 by Dr. Janee Morn.  Gradually recovered.  Developed small hernia at the apex of the incision earlier this year.  Not particularly symptomatic.  However, now he notes left groin pain and a bulge there.  The hernia in the upper abdomen is becoming symptomatic as well.  Dr. Janee Morn sent the patient to me for evaluation to see if a laparoscopic approach is possible given prior open repair.  Patient can walk 20 minutes slowly given his right knee arthralgias.  No history of cardiac symptoms.  History of DVT on chronic anticoagulation.  Does not smoke.  Daily bowel movements.  No recent infections.  No wound infections.  Interested in proceeding with surgery.   No exertional chest/neck/shoulder/arm pain.  Patient can walk 20 minutes for about 1/2 miles without difficulty.   I saw him six weeks ago.  Time was reasonable to repair the since they were getting symptomatic.  Ideally would like a different approach from prior repairs equals laparoscopic attempt first.  Recommended cardiac clearance.   Patient Active Problem List   Diagnosis Date Noted  . Recurrent left inguinal hernia 06/29/2012  . Colon cancer screening 02/01/2012  . Right knee pain 09/14/2011  . Compression fracture 08/17/2011  . Back pain 07/13/2011  . Knee pain,  bilateral 07/13/2011  . GERD (gastroesophageal reflux disease) 07/13/2011  . Anxiety 03/02/2011  . Postural dizziness 03/02/2011  . Numbness and tingling in hands 02/22/2011  . 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 DVT (deep venous thrombosis) 11/20/2010  . Constipation 11/20/2010  . Incisional hernia 08/20/2010    Past Medical History  Diagnosis Date  . DVT (deep venous thrombosis) 2010 and 10/2010    after Hernia repair in 2010 s/p IVC filter and coumadin. Next episode after Hernia repair  ( 10/23/2010) . Admitted on 11/02/2010 for repeat DVT. Warfarin was stopped for  5 days before surgery.   . Incisional hernia 08/20/2010  . Constipation 11/20/2010  . Headache(784.0)   . GERD (gastroesophageal reflux disease)   . Pulmonary embolism 2010  . DVT (deep venous thrombosis) 10/2011    right leg after knee surgery    Past Surgical History  Procedure Laterality Date  . Tonsillectomy    . Hernia repair  2010  . Hernia repair  10/23/2010    Incisional hernia w/mesh  . Hernia repair    . Appendectomy    . Septic abdominal surgery    . Knee surgery Right 10/2011    [facility]  . Femoral artery stent Right 2010    History   Social History  . Marital Status: Divorced    Spouse Name: N/A    Number of Children: 2  . Years of Education: N/A   Occupational History  . retired [employer]   Social History Main Topics  . Smoking status: Former Smoker  Quit date: 09/23/1985  . Smokeless tobacco: Never Used  . Alcohol Use: No  . Drug Use: No  . Sexually Active: Not on file   Other Topics Concern  . Not on file   Social History Narrative  . No narrative on file    Family History  Problem Relation Age of Onset  . Cancer Father     Lymphoma   . Raynaud syndrome Father   . Diabetes Mother   . Parkinsonism Mother   . Thyroid disease Sister   . Diabetes Sister   . Raynaud syndrome Sister   . Lupus Sister   . Raynaud syndrome Sister   . Colon cancer Neg Hx   . Heart disease Mother   .  Heart attack      neice    Current Outpatient Prescriptions  Medication Sig Dispense Refill  . acetaminophen (TYLENOL) 500 MG tablet Take 500 mg by mouth every 6 (six) hours as needed.        . Calcium Carb-Cholecalciferol (CALCIUM + D3) 600-200 MG-UNIT TABS Take 1 tablet by mouth daily.      . Multiple Vitamin (MULITIVITAMIN WITH MINERALS) TABS Take 1 tablet by mouth daily.      . pantoprazole (PROTONIX) 40 MG tablet Take 1 tablet (40 mg total) by mouth daily.  90 tablet  3  . Rivaroxaban (XARELTO PO) Take 20 mg by mouth daily.       . sucralfate (CARAFATE) 1 GM/10ML suspension Take 10 mLs (1 g total) by mouth 4 (four) times daily.  420 mL  2   No current facility-administered medications for this visit.     No Known Allergies  BP 124/84  Pulse 68  Temp(Src) 98.3 F (36.8 C) (Temporal)  Resp 14  Ht 5\' 10"  (1.778 m)  Wt 179 lb 6.4 oz (81.375 kg)  BMI 25.74 kg/m2  No results found.   Review of Systems  Constitutional: Negative for fever, chills and diaphoresis.  HENT: Negative for nosebleeds, sore throat, facial swelling, mouth sores, trouble swallowing and ear discharge.   Eyes: Negative for photophobia, discharge and visual disturbance.  Respiratory: Negative for choking, chest tightness, shortness of breath and stridor.   Cardiovascular: Negative for chest pain and palpitations.  Gastrointestinal: Positive for abdominal pain. Negative for nausea, vomiting, diarrhea, constipation, blood in stool, abdominal distention, anal bleeding and rectal pain.  Endocrine: Negative for cold intolerance and heat intolerance.  Genitourinary: Negative for dysuria, urgency, difficulty urinating and testicular pain.  Musculoskeletal: Positive for myalgias and arthralgias. Negative for back pain and gait problem.  Skin: Negative for color change, pallor, rash and wound.  Allergic/Immunologic: Negative for environmental allergies and food allergies.  Neurological: Negative for dizziness,  speech difficulty, weakness, numbness and headaches.  Hematological: Negative for adenopathy. Does not bruise/bleed easily.  Psychiatric/Behavioral: Negative for hallucinations, confusion and agitation.       Objective:   Physical Exam  Constitutional: He is oriented to person, place, and time. He appears well-developed and well-nourished. No distress.  HENT:  Head: Normocephalic.  Mouth/Throat: Oropharynx is clear and moist. No oropharyngeal exudate.  Eyes: Conjunctivae and EOM are normal. Pupils are equal, round, and reactive to light. No scleral icterus.  Neck: Normal range of motion. Neck supple. No tracheal deviation present.  Cardiovascular: Normal rate, regular rhythm and intact distal pulses.   Pulmonary/Chest: Effort normal and breath sounds normal. No respiratory distress.  Abdominal: Soft. He exhibits no distension. There is tenderness. There is no rigidity, no rebound, no  guarding, no CVA tenderness, no tenderness at McBurney's point and negative Murphy's sign. A hernia is present. Hernia confirmed positive in the ventral area and confirmed positive in the left inguinal area. Hernia confirmed negative in the right inguinal area.    Musculoskeletal: Normal range of motion. He exhibits no tenderness.  Soft brace around the right knee.  Lymphadenopathy:    He has no cervical adenopathy.       Right: No inguinal adenopathy present.       Left: No inguinal adenopathy present.  Neurological: He is alert and oriented to person, place, and time. No cranial nerve deficit. He exhibits normal muscle tone. Coordination normal.  Skin: Skin is warm and dry. No rash noted. He is not diaphoretic. No erythema. No pallor.  Psychiatric: He has a normal mood and affect. His behavior is normal. Judgment and thought content normal.       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  Recurrent left  inguinal hernia status post prior open repair.    Plan:     I agree that he would benefit from surgery.  I think it is reasonable to start out laparoscopically and see if these can be repaired that way.  Probable TAPP approach for left inguinal hernia given prior open repair.  Possible open repairs.  He is interested in proceeding:  The anatomy & physiology of the abdominal wall and pelvic floor was discussed.  The pathophysiology of hernias in the inguinal and pelvic region was discussed.  Natural history risks such as progressive enlargement, pain, incarceration & strangulation was discussed.   Contributors to complications such as smoking, obesity, diabetes, prior surgery, etc were discussed.    I feel the risks of no intervention will lead to serious problems that outweigh the operative risks; therefore, I recommended surgery to reduce and repair the hernia.  I explained laparoscopic techniques with possible need for an open approach.  I noted usual use of mesh to patch and/or buttress hernia repair  Risks such as bleeding, infection, abscess, need for further treatment, heart attack, death, and other risks were discussed.  I noted a good likelihood this will help address the problem.   Goals of post-operative recovery were discussed as well.  Possibility that this will not correct all symptoms was explained.  I stressed the importance of low-impact activity, aggressive pain control, avoiding constipation, & not pushing through pain to minimize risk of post-operative chronic pain or injury. Possibility of reherniation was discussed.  We will work to minimize complications.     An educational handout further explaining the pathology & treatment options was given as well.  Questions were answered.  The patient expresses understanding & wishes to proceed with surgery.  Cardiac clearance done.  They are recommending a Lovenox bridge.  We will try and hold off 24-48 hours after surgery as his risk of  bleeding is much higher with a Lovenox bridge postoperatively.  The challenge is that he had clots off anticoagulation.  Given the prior open repairs, would be best to do re\re prepared laparoscopically if possible.

## 2012-08-22 NOTE — Patient Instructions (Addendum)
HERNIA REPAIR: POST OP INSTRUCTIONS  1. DIET: Follow a light bland diet the first 24 hours after arrival home, such as soup, liquids, crackers, etc.  Be sure to include lots of fluids daily.  Avoid fast food or heavy meals as your are more likely to get nauseated.  Eat a low fat the next few days after surgery. 2. Take your usually prescribed home medications unless otherwise directed. 3. PAIN CONTROL: a. Pain is best controlled by a usual combination of three different methods TOGETHER: i. Ice/Heat ii. Over the counter pain medication iii. Prescription pain medication b. Most patients will experience some swelling and bruising around the hernia(s) such as the bellybutton, groins, or old incisions.  Ice packs or heating pads (30-60 minutes up to 6 times a day) will help. Use ice for the first few days to help decrease swelling and bruising, then switch to heat to help relax tight/sore spots and speed recovery.  Some people prefer to use ice alone, heat alone, alternating between ice & heat.  Experiment to what works for you.  Swelling and bruising can take several weeks to resolve.   c. It is helpful to take an over-the-counter pain medication regularly for the first few weeks.  Choose one of the following that works best for you: i. Naproxen (Aleve, etc)  Two 220mg tabs twice a day ii. Ibuprofen (Advil, etc) Three 200mg tabs four times a day (every meal & bedtime) iii. Acetaminophen (Tylenol, etc) 325-650mg four times a day (every meal & bedtime) d. A  prescription for pain medication should be given to you upon discharge.  Take your pain medication as prescribed.  i. If you are having problems/concerns with the prescription medicine (does not control pain, nausea, vomiting, rash, itching, etc), please call us [phone] to see if we need to switch you to a different pain medicine that will work better for you and/or control your side effect better. ii. If you need a refill on your pain  medication, please contact your pharmacy.  They will contact our office to request authorization. Prescriptions will not be filled after 5 pm or on week-ends. 4. Avoid getting constipated.  Between the surgery and the pain medications, it is common to experience some constipation.  Increasing fluid intake and taking a fiber supplement (such as Metamucil, Citrucel, FiberCon, MiraLax, etc) 1-2 times a day regularly will usually help prevent this problem from occurring.  A mild laxative (prune juice, Milk of Magnesia, MiraLax, etc) should be taken according to package directions if there are no bowel movements after 48 hours.   5. Wash / shower every day.  You may shower over the dressings as they are waterproof.   6. Remove your waterproof bandages 5 days after surgery.  You may leave the incision open to air.  You may replace a dressing/Band-Aid to cover the incision for comfort if you wish.  Continue to shower over incision(s) after the dressing is off.    7. ACTIVITIES as tolerated:   a. You may resume regular (light) daily activities beginning the next day-such as daily self-care, walking, climbing stairs-gradually increasing activities as tolerated.  If you can walk 30 minutes without difficulty, it is safe to try more intense activity such as jogging, treadmill, bicycling, low-impact aerobics, swimming, etc. b. Save the most intensive and strenuous activity for last such as sit-ups, heavy lifting, contact sports, etc  Refrain from any heavy lifting or straining until you are off narcotics for pain control.     c. DO NOT PUSH THROUGH PAIN.  Let pain be your guide: If it hurts to do something, don't do it.  Pain is your body warning you to avoid that activity for another week until the pain goes down. d. You may drive when you are no longer taking prescription pain medication, you can comfortably wear a seatbelt, and you can safely maneuver your car and apply brakes. e. Dennis Bast may have sexual intercourse  when it is comfortable.  8. FOLLOW UP in our office a. Please call [facility] at [phone] to set up an appointment to see your surgeon in the office for a follow-up appointment approximately 2-3 weeks after your surgery. b. Make sure that you call for this appointment the day you arrive home to insure a convenient appointment time. 9.  IF YOU HAVE DISABILITY OR FAMILY LEAVE FORMS, BRING THEM TO THE OFFICE FOR PROCESSING.  DO NOT GIVE THEM TO YOUR DOCTOR.  WHEN TO CALL us [phone]: 1. Poor pain control 2. Reactions / problems with new medications (rash/itching, nausea, etc)  3. Fever over 101.5 F (38.5 C) 4. Inability to urinate 5. Nausea and/or vomiting 6. Worsening swelling or bruising 7. Continued bleeding from incision. 8. Increased pain, redness, or drainage from the incision   The clinic staff is available to answer your questions during regular business hours (8:30am-5pm).  Please don't hesitate to call and ask to speak to one of our nurses for clinical concerns.   If you have a medical emergency, go to the nearest emergency room or call 911.  A surgeon from [facility] is always on call at the hospitals in [facility], [place], [place], [place], [place]  [address] ?  [address] [address], [place],    [address] MAIN: [id number] ? TOLL FREE: [phone] ? FAX: [phone] [email or web address]  Managing Pain  Pain after surgery or related to activity is often due to strain/injury to muscle, tendon, nerves and/or incisions.  This pain is usually short-term and will improve in a few months.   Many people find it helpful to do the following things TOGETHER to help speed the process of healing and to get back to regular activity more quickly:  1. Avoid heavy physical activity a.  no lifting greater than 20 pounds b. Do not "push through" the pain.  Listen to your body and avoid positions and maneuvers than  reproduce the pain c. Walking is okay as tolerated, but go slowly and stop when getting sore.  d. Remember: If it hurts to do it, then don't do it! 2. Take Anti-inflammatory medication  a. Take with food/snack around the clock for 1-2 weeks i. This helps the muscle and nerve tissues become less irritable and calm down faster b. Choose ONE of the following over-the-counter medications: i. Naproxen 257m tabs (ex. Aleve) 1-2 pills twice a day  ii. Ibuprofen 2044mtabs (ex. Advil, Motrin) 3-4 pills with every meal and just before bedtime iii. Acetaminophen 50075mabs (Tylenol) 1-2 pills with every meal and just before bedtime 3. Use a Heating pad or Ice/Cold Pack a. 4-6 times a day b. May use warm bath/hottub  or showers 4. Try Gentle Massage and/or Stretching  a. at the area of pain many times a day b. stop if you feel pain - do not overdo it  Try these steps together to help you body heal faster and avoid making things get worse.  Doing just one of these  things may not be enough.    If you are not getting better after two weeks or are noticing you are getting worse, contact our office for further advice; we may need to re-evaluate you & see what other things we can do to help.  Deep Vein Thrombosis A deep vein thrombosis (DVT) is a blood clot that develops in a deep vein. A DVT is a clot in the deep, larger veins of the leg, arm, or pelvis. These are more dangerous than clots that might form in veins near the surface of the body. A DVT can lead to complications if the clot breaks off and travels in the bloodstream to the lungs.  A DVT can damage the valves in your leg veins, so that instead of flowing upwards, the blood pools in the lower leg. This is called post-thrombotic syndrome, and can result in pain, swelling, discoloration, and sores on the leg. Once identified, a DVT can be treated. It can also be prevented in some circumstances. Once you have had a DVT, you may be at increased risk  for a DVT in the future. CAUSES Blood clots form in a vein for different reasons. Usually several things contribute to blood clots. Contributing factors include:  The flow of blood slows down.  The inside of the vein is damaged in some way.  The person has a condition that makes blood clot more easily. Some people are more likely than others to develop blood clots. That is because they have more factors that make clots likely. These are called risk factors. Risk factors include:   Older age, especially over 23 years old.  Having a history of blood clots. This means you have had one before. Or, it means that someone else in your family has had blood clots. You may have a genetic tendency to form clots.  Having major or lengthy surgery. This is especially true for surgery on the hip, knee, or belly (abdomen). Hip surgery is particularly high risk.  Breaking a hip or leg.  Sitting or lying still for a long time. This includes long distance travel, paralysis, or recovery from an illness or surgery.  Cancer, or cancer treatment.  Having a long, thin tube (catheter) placed inside a vein during a medical procedure.  Being overweight (obese).  Pregnancy and childbirth. Hormone changes make the blood clot more easily during pregnancy. The fetus puts pressure on the veins of the pelvis. There is also risk of injury to veins during delivery or a caesarean. The risk is at its highest just after childbirth.  Medicines with the male hormone estrogen. This includes birth control pills and hormone replacement therapy.  Smoking.  Other circulation or heart problems. SYMPTOMS When a clot forms, it can either partially or totally block the blood flow in that vein. Symptoms of a DVT can include:  Swelling of the leg or arm, especially if one side is much worse.  Warmth and redness of the leg or arm, especially if one side is much worse.  Pain in an arm or leg. If the clot is in the leg,  symptoms may be more noticeable or worse when standing or walking. The symptoms of a DVT that has traveled to the lungs (pulmonary embolism, PE) usually start suddenly, and include:  Shortness of breath.  Coughing.  Coughing up blood or blood-tinged phlegm.  Chest pain. The chest pain is often worse with deep breaths.  Rapid heartbeat. Anyone with these symptoms should get emergency medical treatment  right away. Call your local emergency services (911 in [place] if you have these symptoms. DIAGNOSIS If a DVT is suspected, your caregiver will take a full medical history and carry out a physical exam. Tests that also may be required include:  Blood tests, including studies of the clotting properties of the blood.  Ultrasonography to see if you have clots in your legs or lungs.  X-rays to show the flow of blood when dye is injected into the veins (venography).  Studies of your lungs, if you have any chest symptoms. PREVENTION  Exercise the legs regularly. Take a brisk 30 minute walk every day.  Maintain a weight that is appropriate for your height.  Avoid sitting or lying in bed for long periods of time without moving your legs.  Women, particularly those over the age of 51, should consider the risks and benefits of taking estrogen medicines, including birth control pills.  Do not smoke, especially if you take estrogen medicines.  Long distance travel can increase your risk of DVT. You should exercise your legs by walking or pumping the muscles every hour.  In-hospital prevention:  Many of the risk factors above relate to situations that exist with hospitalization, either for illness, injury, or elective surgery.  Your caregiver will assess you for the need for venous thromboembolism prophylaxis when you are admitted to the hospital. If you are having surgery, your surgeon will assess you the day of or day after surgery.  Prevention may include medical and nonmedical  measures. TREATMENT Treatment for DVT helps prevent death and disability. The most common treatment for DVT is blood thinning (anticoagulant) medicine, which reduces the blood's tendency to clot. Anticoagulants can stop new blood clots from forming and old ones from growing. They cannot dissolve existing clots. Your body does this by itself over time. Anticoagulants can be given by mouth, by intravenous (IV) access, or by injection. Your caregiver will determine the best program for you.  Heparin or related medicines (low molecular weight heparin) are usually the first treatment for a blood clot. They act quickly. However, they cannot be taken orally.  Heparin can cause a fall in a component of blood that stops bleeding and forms blood clots (platelets). You will be monitored with blood tests to be sure this does not occur.  Warfarin is an anticoagulant that can be swallowed (taken orally). It takes a few days to start working, so usually heparin or related medicines are used in combination. Once warfarin is working, heparin is usually stopped.  Less commonly, clot dissolving drugs (thrombolytics) are used to dissolve a DVT. They carry a high risk of bleeding, so they are used mainly in severe cases, where a life or limb is threatened.  Very rarely, a blood clot in the leg needs to be removed surgically.  If you are unable to take anticoagulants, your caregiver may arrange for you to have a filter placed in a main vein in your belly (abdomen). This filter prevents clots from traveling to your lungs. HOME CARE INSTRUCTIONS  Take all medicines prescribed by your caregiver. Follow the directions carefully.  Warfarin. Most people will continue taking warfarin after hospital discharge. Your caregiver will advise you on the length of treatment (usually 3 6 months, sometimes lifelong).  Too much and too little warfarin are both dangerous. Too much warfarin increases the risk of bleeding. Too little  warfarin continues to allow the risk for blood clots. While taking warfarin, you will need to have regular blood tests  to measure your blood clotting time. These blood tests usually include both the prothrombin time (PT) and international normalized ratio (INR) tests. The PT and INR results allow your caregiver to adjust your dose of warfarin. The dose can change for many reasons. It is critically important that you take warfarin exactly as prescribed, and that you have your PT and INR levels drawn exactly as directed.  Many foods, especially foods high in vitamin K can interfere with warfarin and affect the PT and INR results. Foods high in vitamin K include spinach, kale, broccoli, cabbage, collard and turnip greens, brussels sprouts, peas, cauliflower, seaweed, and parsley as well as beef and pork liver, green tea, and soybean oil. You should eat a consistent amount of foods high in vitamin K. Avoid major changes in your diet, or notify your caregiver before changing your diet. Arrange a visit with a dietitian to answer your questions.  Many medicines can interfere with warfarin and affect the PT and INR results. You must tell your caregiver about any and all medicines you take, this includes all vitamins and supplements. Be especially cautious with aspirin and anti-inflammatory medicines. Ask your caregiver before taking these. Do not take or discontinue any prescribed or over-the-counter medicine except on the advice of your caregiver or pharmacist.  Warfarin can have side effects, primarily excessive bruising or bleeding. You will need to hold pressure over cuts for longer than usual. Your caregiver or pharmacist will discuss other potential side effects.  Alcohol can change the body's ability to handle warfarin. It is best to avoid alcoholic drinks or consume only very small amounts while taking warfarin. Notify your caregiver if you change your alcohol intake.  Notify your dentist or other  caregivers before procedures.  Activity. Ask your caregiver how soon you can go back to normal activities. It is important to stay active to prevent blood clots. If you are on anticoagulant medicine, avoid contact sports.  Exercise. It is very important to exercise. This is especially important while traveling, sitting or standing for long periods of time. Exercise your legs by walking or by pumping the muscles frequently. Take frequent walks.  Compression stockings. These are tight elastic stockings that apply pressure to the lower legs. This pressure can help keep the blood in the legs from clotting. You may need to wear compressions stockings at home to help prevent a DVT.  Smoking. If you smoke, quit. Ask your caregiver for help with quitting smoking.  Learn as much as you can about DVT. Knowing more about the condition should help you keep it from coming back.  Wear a medical alert bracelet or carry a medical alert card. SEEK MEDICAL CARE IF:  You notice a rapid heartbeat.  You feel weaker or more tired than usual.  You feel faint.  You notice increased bruising.  You feel your symptoms are not getting better in the time expected.  You believe you are having side effects of medicine. SEEK IMMEDIATE MEDICAL CARE IF:  You have chest pain.  You have trouble breathing.  You have new or increased swelling or pain in one leg.  You cough up blood.  You notice blood in vomit, in a bowel movement, or in urine. MAKE SURE YOU:  Understand these instructions.  Will watch your condition.  Will get help right away if you are not doing well or get worse. Document Released: 01/19/2005 Document Revised: 10/14/2011 Document Reviewed: 03/13/2010 [facility] Patient Information 2014 [place], [place].

## 2012-08-23 ENCOUNTER — Encounter (INDEPENDENT_AMBULATORY_CARE_PROVIDER_SITE_OTHER): Payer: Self-pay

## 2012-09-09 ENCOUNTER — Encounter (HOSPITAL_COMMUNITY): Payer: Self-pay | Admitting: Pharmacy Technician

## 2012-09-16 NOTE — Patient Instructions (Addendum)
20 Angel Weedon.  09/16/2012   Your procedure is scheduled on: 09/22/12  Report to [facility] at 8:45 AM.  Call this number if you have problems the morning of surgery 336-: [phone]   Remember:   Do not eat food or drink liquids After Midnight.   Do not wear jewelry, make-up or nail polish.  Do not wear lotions, powders, or perfumes. You may wear deodorant.  Do not shave 48 hours prior to surgery. Men may shave face and neck.  Do not bring valuables to the hospital.  Contacts, dentures or bridgework may not be worn into surgery.     Patients discharged the day of surgery will not be allowed to drive home.  Name and phone number of your driver: Judie Grieve [phone] or Fannie Knee [phone]    Please read over the following fact sheets that you were given: MRSA Information.  Birdie Sons, RN  pre op nurse call if needed [phone]    FAILURE TO FOLLOW THESE INSTRUCTIONS MAY RESULT IN CANCELLATION OF YOUR SURGERY   Patient Signature: ___________________________________________

## 2012-09-16 NOTE — Progress Notes (Signed)
LOV note with surgery clearance 07/01/12 Dr. Felipa Eth on chart

## 2012-09-19 ENCOUNTER — Encounter (HOSPITAL_COMMUNITY): Payer: Self-pay

## 2012-09-19 ENCOUNTER — Encounter (HOSPITAL_COMMUNITY)
Admission: RE | Admit: 2012-09-19 | Discharge: 2012-09-19 | Disposition: A | Discharge status: Home / Self Care | Payer: Managed Care, Other (non HMO) | Source: Ambulatory Visit | Attending: Surgery | Admitting: Surgery

## 2012-09-19 LAB — CBC
MCHC: 32.8 g/dL (ref 30.0–36.0)
Platelets: 282 10*3/uL (ref 150–400)
RDW: 14.4 % (ref 11.5–15.5)
WBC: 5.5 10*3/uL (ref 4.0–10.5)

## 2012-09-19 LAB — BASIC METABOLIC PANEL
BUN: 16 mg/dL (ref 6–23)
Calcium: 9.7 mg/dL (ref 8.4–10.5)
Creatinine, Ser: 1.17 mg/dL (ref 0.50–1.35)
GFR calc Af Amer: 74 mL/min — ABNORMAL LOW (ref >90)
GFR calc non Af Amer: 64 mL/min — ABNORMAL LOW (ref >90)

## 2012-09-19 LAB — SURGICAL PCR SCREEN
MRSA, PCR: NEGATIVE
Staphylococcus aureus: POSITIVE — AB

## 2012-09-19 NOTE — Progress Notes (Signed)
Patient on cefazolin preoperatively.  That should be sufficient.  Continue chlorhexidine cloths/baths preoperatively per protocol

## 2012-09-21 ENCOUNTER — Telehealth (INDEPENDENT_AMBULATORY_CARE_PROVIDER_SITE_OTHER): Payer: Self-pay

## 2012-09-21 NOTE — Telephone Encounter (Signed)
Patients sister called stating staph swab came back positive from pre op work up and hospital told them something would be called into pharmacy yesterday but nothing was called in. She was calling in to check on status of this. Explained I will send note back to Dr Michaell Cowing and his assistant and we would call her back.

## 2012-09-21 NOTE — Telephone Encounter (Signed)
I called Fannie Knee back and told her Dr Michaell Cowing said since MRSA was negative he was not going to prescribe anything for the Staph at this time. Fannie Knee understands.

## 2012-09-22 ENCOUNTER — Observation Stay (HOSPITAL_COMMUNITY)
Admission: RE | Admit: 2012-09-22 | Discharge: 2012-09-23 | Disposition: A | Discharge status: Home / Self Care | Payer: Managed Care, Other (non HMO) | Source: Ambulatory Visit | Attending: Surgery | Admitting: Surgery

## 2012-09-22 ENCOUNTER — Ambulatory Visit (HOSPITAL_COMMUNITY): Payer: Managed Care, Other (non HMO) | Admitting: *Deleted

## 2012-09-22 ENCOUNTER — Encounter (HOSPITAL_COMMUNITY): Payer: Self-pay | Admitting: *Deleted

## 2012-09-22 ENCOUNTER — Encounter (HOSPITAL_COMMUNITY)
Admission: RE | Disposition: A | Discharge status: Home / Self Care | Payer: Self-pay | Source: Ambulatory Visit | Attending: Surgery

## 2012-09-22 ENCOUNTER — Encounter (HOSPITAL_COMMUNITY): Payer: Self-pay

## 2012-09-22 DIAGNOSIS — K4091 Unilateral inguinal hernia, without obstruction or gangrene, recurrent: Secondary | ICD-10-CM | POA: Insufficient documentation

## 2012-09-22 DIAGNOSIS — Z01812 Encounter for preprocedural laboratory examination: Secondary | ICD-10-CM | POA: Insufficient documentation

## 2012-09-22 DIAGNOSIS — K660 Peritoneal adhesions (postprocedural) (postinfection): Secondary | ICD-10-CM

## 2012-09-22 DIAGNOSIS — K4021 Bilateral inguinal hernia, without obstruction or gangrene, recurrent: Secondary | ICD-10-CM | POA: Diagnosis present

## 2012-09-22 DIAGNOSIS — I739 Peripheral vascular disease, unspecified: Secondary | ICD-10-CM | POA: Insufficient documentation

## 2012-09-22 DIAGNOSIS — K4020 Bilateral inguinal hernia, without obstruction or gangrene, not specified as recurrent: Secondary | ICD-10-CM

## 2012-09-22 DIAGNOSIS — K432 Incisional hernia without obstruction or gangrene: Principal | ICD-10-CM | POA: Diagnosis present

## 2012-09-22 HISTORY — PX: INGUINAL HERNIA REPAIR: SHX194

## 2012-09-22 HISTORY — PX: LAPAROSCOPIC LYSIS OF ADHESIONS: SHX5905

## 2012-09-22 HISTORY — PX: INSERTION OF MESH: SHX5868

## 2012-09-22 HISTORY — PX: INCISIONAL HERNIA REPAIR: SHX193

## 2012-09-22 SURGERY — REPAIR, HERNIA, INCISIONAL, LAPAROSCOPIC
Anesthesia: General | Site: Abdomen | Wound class: Clean

## 2012-09-22 MED ORDER — ALUM & MAG HYDROXIDE-SIMETH 200-200-20 MG/5ML PO SUSP: 30.0000 mL | Freq: Four times a day (QID) | ORAL | Status: DC | PRN

## 2012-09-22 MED ORDER — PROPOFOL 10 MG/ML IV BOLUS
INTRAVENOUS | Status: DC | PRN
  Administered 2012-09-22: 180 mg via INTRAVENOUS

## 2012-09-22 MED ORDER — ACETAMINOPHEN 500 MG PO TABS
1000.0000 mg | ORAL_TABLET | Freq: Three times a day (TID) | ORAL | Status: DC
  Administered 2012-09-22 – 2012-09-23 (×2): 1000 mg via ORAL
  Filled 2012-09-22 (×5): qty 2

## 2012-09-22 MED ORDER — KETOROLAC TROMETHAMINE 30 MG/ML IJ SOLN
INTRAMUSCULAR | Status: DC | PRN
  Administered 2012-09-22: 30 mg via INTRAVENOUS

## 2012-09-22 MED ORDER — BUPIVACAINE-EPINEPHRINE 0.25% -1:200000 IJ SOLN
INTRAMUSCULAR | Status: DC | PRN
  Administered 2012-09-22: 70 mL

## 2012-09-22 MED ORDER — HYDROMORPHONE HCL PF 1 MG/ML IJ SOLN
INTRAMUSCULAR | Status: DC | PRN
  Administered 2012-09-22: 0.5 mg via INTRAVENOUS
  Administered 2012-09-22: 1 mg via INTRAVENOUS
  Administered 2012-09-22: 0.5 mg via INTRAVENOUS

## 2012-09-22 MED ORDER — LIP MEDEX EX OINT
1.0000 "application " | TOPICAL_OINTMENT | Freq: Two times a day (BID) | CUTANEOUS | Status: DC
  Administered 2012-09-22 – 2012-09-23 (×2): 1 via TOPICAL

## 2012-09-22 MED ORDER — 0.9 % SODIUM CHLORIDE (POUR BTL) OPTIME
TOPICAL | Status: DC | PRN
  Administered 2012-09-22: 1000 mL

## 2012-09-22 MED ORDER — CALCIUM + D3 600-200 MG-UNIT PO TABS: 1.0000 | ORAL_TABLET | Freq: Every day | ORAL | Status: DC

## 2012-09-22 MED ORDER — CEFAZOLIN SODIUM-DEXTROSE 2-3 GM-% IV SOLR
2.0000 g | INTRAVENOUS | Status: AC
  Administered 2012-09-22: 2 g via INTRAVENOUS

## 2012-09-22 MED ORDER — LACTATED RINGERS IV SOLN
INTRAVENOUS | Status: DC | PRN
  Administered 2012-09-22 (×2): via INTRAVENOUS

## 2012-09-22 MED ORDER — NEOSTIGMINE METHYLSULFATE 1 MG/ML IJ SOLN
INTRAMUSCULAR | Status: DC | PRN
  Administered 2012-09-22: 4 mg via INTRAVENOUS

## 2012-09-22 MED ORDER — BUPIVACAINE 0.25 % ON-Q PUMP DUAL CATH 300 ML
300.0000 mL | INJECTION | Status: DC
  Filled 2012-09-22: qty 300

## 2012-09-22 MED ORDER — OXYCODONE HCL 5 MG PO TABS
5.0000 mg | ORAL_TABLET | ORAL | Status: DC | PRN
  Administered 2012-09-22 – 2012-09-23 (×4): 5 mg via ORAL
  Filled 2012-09-22 (×4): qty 1

## 2012-09-22 MED ORDER — LIDOCAINE HCL (CARDIAC) 20 MG/ML IV SOLN
INTRAVENOUS | Status: DC | PRN
  Administered 2012-09-22: 7 mg via INTRAVENOUS

## 2012-09-22 MED ORDER — BLISTEX EX OINT
TOPICAL_OINTMENT | CUTANEOUS | Status: AC
  Filled 2012-09-22: qty 10

## 2012-09-22 MED ORDER — BISACODYL 10 MG RE SUPP: 10.0000 mg | Freq: Two times a day (BID) | RECTAL | Status: DC | PRN

## 2012-09-22 MED ORDER — CEFAZOLIN SODIUM-DEXTROSE 2-3 GM-% IV SOLR
INTRAVENOUS | Status: AC
  Filled 2012-09-22: qty 50

## 2012-09-22 MED ORDER — HYDROMORPHONE HCL PF 1 MG/ML IJ SOLN
0.2500 mg | INTRAMUSCULAR | Status: DC | PRN
  Administered 2012-09-22: 0.5 mg via INTRAVENOUS
  Administered 2012-09-22 (×2): 0.25 mg via INTRAVENOUS

## 2012-09-22 MED ORDER — BUPIVACAINE-EPINEPHRINE 0.25% -1:200000 IJ SOLN
INTRAMUSCULAR | Status: AC
  Filled 2012-09-22: qty 2

## 2012-09-22 MED ORDER — HYDROMORPHONE HCL PF 1 MG/ML IJ SOLN
INTRAMUSCULAR | Status: AC
  Filled 2012-09-22: qty 1

## 2012-09-22 MED ORDER — ADULT MULTIVITAMIN W/MINERALS CH
1.0000 | ORAL_TABLET | Freq: Every day | ORAL | Status: DC
  Administered 2012-09-23: 1 via ORAL
  Filled 2012-09-22: qty 1

## 2012-09-22 MED ORDER — LACTATED RINGERS IV BOLUS (SEPSIS): 1000.0000 mL | Freq: Three times a day (TID) | INTRAVENOUS | Status: DC | PRN

## 2012-09-22 MED ORDER — FENTANYL CITRATE 0.05 MG/ML IJ SOLN: 25.0000 ug | INTRAMUSCULAR | Status: DC | PRN

## 2012-09-22 MED ORDER — FENTANYL CITRATE 0.05 MG/ML IJ SOLN
INTRAMUSCULAR | Status: DC | PRN
  Administered 2012-09-22: 100 ug via INTRAVENOUS
  Administered 2012-09-22 (×2): 50 ug via INTRAVENOUS

## 2012-09-22 MED ORDER — SODIUM CHLORIDE 0.9 % IJ SOLN: 3.0000 mL | Freq: Two times a day (BID) | INTRAMUSCULAR | Status: DC

## 2012-09-22 MED ORDER — BUPIVACAINE 0.25 % ON-Q PUMP DUAL CATH 300 ML
300.0000 mL | INJECTION | Status: DC
  Administered 2012-09-22: 300 mL
  Filled 2012-09-22: qty 300

## 2012-09-22 MED ORDER — EPHEDRINE SULFATE 50 MG/ML IJ SOLN
INTRAMUSCULAR | Status: DC | PRN
  Administered 2012-09-22: 5 mg via INTRAVENOUS

## 2012-09-22 MED ORDER — ROCURONIUM BROMIDE 100 MG/10ML IV SOLN
INTRAVENOUS | Status: DC | PRN
  Administered 2012-09-22: 50 mg via INTRAVENOUS

## 2012-09-22 MED ORDER — PROMETHAZINE HCL 25 MG/ML IJ SOLN: 12.5000 mg | Freq: Four times a day (QID) | INTRAMUSCULAR | Status: DC | PRN

## 2012-09-22 MED ORDER — METOCLOPRAMIDE HCL 5 MG/ML IJ SOLN
INTRAMUSCULAR | Status: DC | PRN
  Administered 2012-09-22: 10 mg via INTRAVENOUS

## 2012-09-22 MED ORDER — MIDAZOLAM HCL 5 MG/5ML IJ SOLN
INTRAMUSCULAR | Status: DC | PRN
  Administered 2012-09-22 (×2): 1 mg via INTRAVENOUS

## 2012-09-22 MED ORDER — LACTATED RINGERS IV SOLN: INTRAVENOUS | Status: DC

## 2012-09-22 MED ORDER — LORAZEPAM 2 MG/ML IJ SOLN: 0.5000 mg | Freq: Three times a day (TID) | INTRAMUSCULAR | Status: DC | PRN

## 2012-09-22 MED ORDER — LACTATED RINGERS IR SOLN
Status: DC | PRN
  Administered 2012-09-22: 1000 mL

## 2012-09-22 MED ORDER — OXYCODONE HCL 5 MG PO TABS
5.0000 mg | ORAL_TABLET | ORAL | Status: DC | PRN
Start: 2012-09-22 — End: 2012-09-30

## 2012-09-22 MED ORDER — MAGNESIUM HYDROXIDE 400 MG/5ML PO SUSP: 30.0000 mL | Freq: Two times a day (BID) | ORAL | Status: DC | PRN

## 2012-09-22 MED ORDER — DIPHENHYDRAMINE HCL 50 MG/ML IJ SOLN: 12.5000 mg | Freq: Four times a day (QID) | INTRAMUSCULAR | Status: DC | PRN

## 2012-09-22 MED ORDER — CISATRACURIUM BESYLATE (PF) 10 MG/5ML IV SOLN
INTRAVENOUS | Status: DC | PRN
  Administered 2012-09-22: 4 mg via INTRAVENOUS
  Administered 2012-09-22: 2 mg via INTRAVENOUS
  Administered 2012-09-22: 4 mg via INTRAVENOUS

## 2012-09-22 MED ORDER — ONDANSETRON HCL 4 MG/2ML IJ SOLN: 4.0000 mg | Freq: Four times a day (QID) | INTRAMUSCULAR | Status: DC | PRN

## 2012-09-22 MED ORDER — CALCIUM CARBONATE-VITAMIN D 500-200 MG-UNIT PO TABS
1.0000 | ORAL_TABLET | Freq: Every day | ORAL | Status: DC
  Administered 2012-09-23: 1 via ORAL
  Filled 2012-09-22: qty 1

## 2012-09-22 MED ORDER — PSYLLIUM 95 % PO PACK
1.0000 | PACK | Freq: Two times a day (BID) | ORAL | Status: DC
  Administered 2012-09-22 – 2012-09-23 (×2): 1 via ORAL
  Filled 2012-09-22 (×3): qty 1

## 2012-09-22 MED ORDER — GLYCOPYRROLATE 0.2 MG/ML IJ SOLN
INTRAMUSCULAR | Status: DC | PRN
  Administered 2012-09-22: 0.6 mg via INTRAVENOUS

## 2012-09-22 MED ORDER — METHOCARBAMOL 750 MG PO TABS: 750.0000 mg | ORAL_TABLET | Freq: Four times a day (QID) | ORAL | Status: DC | PRN

## 2012-09-22 MED ORDER — SODIUM CHLORIDE 0.9 % IV SOLN: 250.0000 mL | INTRAVENOUS | Status: DC | PRN

## 2012-09-22 MED ORDER — MAGIC MOUTHWASH
15.0000 mL | Freq: Four times a day (QID) | ORAL | Status: DC | PRN
  Filled 2012-09-22: qty 15

## 2012-09-22 MED ORDER — SACCHAROMYCES BOULARDII 250 MG PO CAPS
250.0000 mg | ORAL_CAPSULE | Freq: Two times a day (BID) | ORAL | Status: DC
  Administered 2012-09-22 – 2012-09-23 (×2): 250 mg via ORAL
  Filled 2012-09-22 (×3): qty 1

## 2012-09-22 MED ORDER — SODIUM CHLORIDE 0.9 % IJ SOLN: 3.0000 mL | INTRAMUSCULAR | Status: DC | PRN

## 2012-09-22 SURGICAL SUPPLY — 60 items
APL SKNCLS STERI-STRIP NONHPOA (GAUZE/BANDAGES/DRESSINGS)
APPLIER CLIP 5 13 M/L LIGAMAX5 (MISCELLANEOUS)
APR CLP MED LRG 5 ANG JAW (MISCELLANEOUS)
BENZOIN TINCTURE PRP APPL 2/3 (GAUZE/BANDAGES/DRESSINGS) IMPLANT
BINDER ABD UNIV 12 45-62 (WOUND CARE) IMPLANT
BINDER ABDOMINAL 46IN 62IN (WOUND CARE) ×4
CANISTER SUCTION 2500CC (MISCELLANEOUS) ×4 IMPLANT
CATH FOLEY 3WAY  5CC 16FR (CATHETERS) ×1
CATH FOLEY 3WAY 5CC 16FR (CATHETERS) IMPLANT
CATH KIT ON-Q SILVERSOAK 7.5 (CATHETERS) IMPLANT
CATH KIT ON-Q SILVERSOAK 7.5IN (CATHETERS) ×8 IMPLANT
CLIP APPLIE 5 13 M/L LIGAMAX5 (MISCELLANEOUS) IMPLANT
CLOTH BEACON ORANGE TIMEOUT ST (SAFETY) ×4 IMPLANT
DECANTER SPIKE VIAL GLASS SM (MISCELLANEOUS) ×4 IMPLANT
DEVICE SECURE STRAP 25 ABSORB (INSTRUMENTS) ×2 IMPLANT
DEVICE TROCAR PUNCTURE CLOSURE (ENDOMECHANICALS) IMPLANT
DISSECTOR BLUNT TIP ENDO 5MM (MISCELLANEOUS) IMPLANT
DRAPE LAPAROSCOPIC ABDOMINAL (DRAPES) ×4 IMPLANT
DRSG TEGADERM 2-3/8X2-3/4 SM (GAUZE/BANDAGES/DRESSINGS) ×12 IMPLANT
ELECT REM PT RETURN 9FT ADLT (ELECTROSURGICAL) ×4
ELECTRODE REM PT RTRN 9FT ADLT (ELECTROSURGICAL) ×3 IMPLANT
FILTER SMOKE EVAC LAPAROSHD (FILTER) IMPLANT
GAUZE SPONGE 2X2 8PLY STRL LF (GAUZE/BANDAGES/DRESSINGS) IMPLANT
GLOVE BIOGEL PI IND STRL 7.0 (GLOVE) ×3 IMPLANT
GLOVE BIOGEL PI INDICATOR 7.0 (GLOVE) ×1
GLOVE ECLIPSE 8.0 STRL XLNG CF (GLOVE) ×4 IMPLANT
GLOVE INDICATOR 8.0 STRL GRN (GLOVE) ×4 IMPLANT
GOWN STRL NON-REIN LRG LVL3 (GOWN DISPOSABLE) ×4 IMPLANT
GOWN STRL REIN XL XLG (GOWN DISPOSABLE) ×8 IMPLANT
KIT BASIN OR (CUSTOM PROCEDURE TRAY) ×4 IMPLANT
MARKER SKIN DUAL TIP RULER LAB (MISCELLANEOUS) ×4 IMPLANT
MESH ULTRAPRO 6X6 15CM15CM (Mesh General) ×2 IMPLANT
MESH VENTRALIGHT ST 6X8 (Mesh Specialty) ×4 IMPLANT
MESH VENTRLGHT ELLIPSE 8X6XMFL (Mesh Specialty) IMPLANT
NDL SPNL 22GX3.5 QUINCKE BK (NEEDLE) IMPLANT
NEEDLE SPNL 22GX3.5 QUINCKE BK (NEEDLE) IMPLANT
NS IRRIG 1000ML POUR BTL (IV SOLUTION) ×4 IMPLANT
PENCIL BUTTON HOLSTER BLD 10FT (ELECTRODE) IMPLANT
SCALPEL HARMONIC ACE (MISCELLANEOUS) IMPLANT
SCISSORS LAP 5X35 DISP (ENDOMECHANICALS) ×5 IMPLANT
SET IRRIG TUBING LAPAROSCOPIC (IRRIGATION / IRRIGATOR) ×1 IMPLANT
SLEEVE Z-THREAD 5X100MM (TROCAR) ×9 IMPLANT
SOLUTION ANTI FOG 6CC (MISCELLANEOUS) ×3 IMPLANT
SPONGE GAUZE 2X2 STER 10/PKG (GAUZE/BANDAGES/DRESSINGS)
STRIP CLOSURE SKIN 1/2X4 (GAUZE/BANDAGES/DRESSINGS) ×1 IMPLANT
SUT ETHIBOND NAB CT1 #1 30IN (SUTURE) IMPLANT
SUT MNCRL AB 4-0 PS2 18 (SUTURE) ×4 IMPLANT
SUT NOVA NAB DX-16 0-1 5-0 T12 (SUTURE) ×6 IMPLANT
SUT PDS AB 0 CT1 36 (SUTURE) ×1 IMPLANT
SUT PROLENE 1 CT 1 30 (SUTURE) ×5 IMPLANT
SUT VIC AB 2-0 UR6 27 (SUTURE) IMPLANT
SYRINGE 10CC LL (SYRINGE) IMPLANT
TACKER 5MM HERNIA 3.5CML NAB (ENDOMECHANICALS) ×6 IMPLANT
TOWEL OR 17X26 10 PK STRL BLUE (TOWEL DISPOSABLE) ×4 IMPLANT
TRAY FOLEY CATH 14FRSI W/METER (CATHETERS) ×1 IMPLANT
TRAY LAP CHOLE (CUSTOM PROCEDURE TRAY) ×4 IMPLANT
TROCAR BLADELESS OPT 5 100 (ENDOMECHANICALS) ×4 IMPLANT
TROCAR XCEL NON-BLD 11X100MML (ENDOMECHANICALS) ×4 IMPLANT
TUBING INSUFFLATION 10FT LAP (TUBING) ×4 IMPLANT
TUNNELER SHEATH ON-Q 16GX12 DP (PAIN MANAGEMENT) ×1 IMPLANT

## 2012-09-22 NOTE — H&P (View-Only) (Signed)
Patient on cefazolin preoperatively.  That should be sufficient.  Continue chlorhexidine cloths/baths preoperatively per protocol 

## 2012-09-22 NOTE — Anesthesia Preprocedure Evaluation (Addendum)
Anesthesia Evaluation  Patient identified by MRN, date of birth, ID band Patient awake    Reviewed: Allergy & Precautions, H&P , NPO status , Patient's Chart, lab work & pertinent test results  Airway Mallampati: II TM Distance: >3 FB Neck ROM: full    Dental no notable dental hx. 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 breath sounds clear to auscultation  Pulmonary exam normal       Cardiovascular Exercise Tolerance: Good + Peripheral Vascular Disease and DVT Rhythm:regular Rate:Normal     Neuro/Psych ataxia negative neurological ROS  negative psych ROS   GI/Hepatic negative GI ROS, Neg liver ROS,   Endo/Other  negative endocrine ROS  Renal/GU negative Renal ROS  negative genitourinary   Musculoskeletal   Abdominal   Peds  Hematology negative hematology ROS (+)   Anesthesia Other Findings   Reproductive/Obstetrics negative OB ROS                         Anesthesia Physical Anesthesia Plan  ASA: III  Anesthesia Plan: General   Post-op Pain Management:    Induction: Intravenous  Airway Management Planned: Oral ETT  Additional Equipment:   Intra-op Plan:   Post-operative Plan: Extubation in OR  Informed Consent: I have reviewed the patients History and Physical, chart, labs and discussed the procedure including the risks, benefits and alternatives for the proposed anesthesia with the patient or authorized representative who has indicated his/her understanding and acceptance.   Dental Advisory Given  Plan Discussed with: CRNA and Surgeon  Anesthesia Plan Comments:         Anesthesia Quick Evaluation

## 2012-09-22 NOTE — Anesthesia Postprocedure Evaluation (Signed)
Anesthesia Post Note  Patient: Griffey Nicasio.  Procedure(s) Performed: Procedure(s) (LRB): INICISIONAL HERNIA AND BILATERAL RECURRENT INGUINAL HERNIA  REPAIRED LAPRASCOPIC  (N/A) INSERTION OF MESH (Bilateral) LAPAROSCOPIC LYSIS OF ADHESIONS 90 minutes  (N/A) LAPAROSCOPIC BILATERAL INGUINAL HERNIA REPAIR (Bilateral)  Anesthesia type: General  Patient location: PACU  Post pain: Pain level controlled  Post assessment: Post-op Vital signs reviewed  Last Vitals:  Filed Vitals:   09/22/12 1615  BP: 115/58  Pulse: 66  Temp:   Resp: 12    Post vital signs: Reviewed  Level of consciousness: sedated  Complications: No apparent anesthesia complications

## 2012-09-22 NOTE — Transfer of Care (Signed)
Immediate Anesthesia Transfer of Care Note  Patient: Jordan Long.  Procedure(s) Performed: Procedure(s) with comments: INICISIONAL HERNIA AND BILATERAL RECURRENT INGUINAL HERNIA  REPAIRED LAPRASCOPIC  (N/A) INSERTION OF MESH (Bilateral) - FOR REPAIR OF BILATERAL INGUINAL HERNIAS AND ALSO MESH INSERTED FOR VENTRAL HERNIA REPAIR  LAPAROSCOPIC LYSIS OF ADHESIONS 90 minutes  (N/A) LAPAROSCOPIC BILATERAL INGUINAL HERNIA REPAIR (Bilateral) - RECURRENT INGUINAL HERNIAS BILATERAL   Patient Location: PACU  Anesthesia Type:General  Level of Consciousness: sedated  Airway & Oxygen Therapy: Patient Spontanous Breathing and Patient connected to face mask oxygen  Post-op Assessment: Report given to PACU RN and Post -op Vital signs reviewed and stable  Post vital signs: Reviewed and stable  Complications: No apparent anesthesia complications

## 2012-09-22 NOTE — Op Note (Signed)
09/22/2012  3:23 PM  PATIENT:  Jordan Long.  62 y.o. male  Patient Care Team: Windell Hummingbird, MD as PCP - General Othella Boyer, MD as Consulting Physician (Cardiology)  PRE-OPERATIVE DIAGNOSIS:  incisional ventral wall and recurrent left inguinal hernia   POST-OPERATIVE DIAGNOSIS:  incisional vetral wall and recurrrent left inguinal hernia  PROCEDURE:  Procedure(s): INICISIONAL HERNIA (RECURRENT) REPAIR LAPRASCOPIC  INSERTION OF MESH LAPAROSCOPIC LYSIS OF ADHESIONS 90 minutes  LAPAROSCOPIC BILATERAL INGUINAL HERNIA REPAIR  SURGEON:  Surgeon(s): Ardeth Sportsman, MD  ANESTHESIA:   local and general  EBL:  Total I/O In: 2000 [I.V.:2000] Out: 350 [Urine:300; Blood:50]  Delay start of Pharmacological VTE agent (>24hrs) due to surgical blood loss or risk of bleeding:  yes  DRAINS: none   SPECIMEN:  No Specimen  DISPOSITION OF SPECIMEN:  N/A  COUNTS:  YES  PLAN OF CARE: Discharge to home after PACU  PATIENT DISPOSITION:  PACU - hemodynamically stable.  INDICATION: Pleasant patient has developed a ventral wall abdominal hernia Incisional just above his prior open repair with mesh.  Also developed at least recurrent left inguinal hernia after prior open repair with mesh.  I recommended a laparoscopic exploration with repairs of hernias:   Recommendation was made for surgical repair:  The anatomy & physiology of the abdominal wall was discussed. The pathophysiology of hernias was discussed. Natural history risks without surgery including progeressive enlargement, pain, incarceration & strangulation was discussed. Contributors to complications such as smoking, obesity, diabetes, prior surgery, etc were discussed.  I feel the risks of no intervention will lead to serious problems that outweigh the operative risks; therefore, I recommended surgery to reduce and repair the hernia. I explained laparoscopic techniques with possible need for an open approach. I noted the  probable use of mesh to patch and/or buttress the hernia repair  Risks such as bleeding, infection, abscess, need for further treatment, heart attack, death, and other risks were discussed. I noted a good likelihood this will help address the problem. Goals of post-operative recovery were discussed as well. Possibility that this will not correct all symptoms was explained. I stressed the importance of low-impact activity, aggressive pain control, avoiding constipation, & not pushing through pain to minimize risk of post-operative chronic pain or injury. Possibility of reherniation especially with smoking, obesity, diabetes, immunosuppression, and other health conditions was discussed. We will work to minimize complications.  An educational handout further explaining the pathology & treatment options was given as well. Questions were answered. The patient expresses understanding & wishes to proceed with surgery.   OR FINDINGS: Patient had a 7 x 3 cm region of Swiss cheese hernias supraumbilically.  This was above his.  He an infraumbilical hernia repair with mesh.  No evidence of hernia there.     Type of repair - Laparoscopic underlay repair   Name of mesh - Bard ventral ex mesh.  Size of mesh - Length 20 cm, Width 15 cm  Mesh overlap - 5-7 cm  Placement of mesh - Intraperitoneal underlay repair  He had a moderate-sized indirect recurrent left inguinal hernia with sigmoid colon partially within it.  He had a smaller direct inguinal hernia on the right side incarcerated with an epiploic appendage and preperitoneal fat.  Those repaired with ultra pro 15 x 15 cm ultralight weight polypropylene mesh in a TAPP fashion   DESCRIPTION:   Informed consent was confirmed. The patient underwent general anaesthesia without difficulty. The patient was positioned appropriately. VTE prevention in place.  The patient's abdomen was clipped, prepped, & draped in a sterile fashion. Surgical timeout confirmed our  plan.  The patient was positioned in reverse Trendelenburg. Abdominal entry was gained using optical entry technique in the left upper abdomen. Entry was clean. I induced carbon dioxide insufflation. Camera inspection revealed no injury. Extra ports were carefully placed under direct laparoscopic visualization.   Patient had very decent dense but soft adhesions.  Cotton candy type.  And covered nearly the entire surface of the abdomen and visceral/parietal peritoneum.  I carefully created enough working space with the camera to play sports in the abdomen laterally.  Use sharp scissors to free off the transverse colon and greater omentum off the upper anterior abdominal wall.  I found the supraumbilical larger hernia with some smaller Swiss cheese hernias around it.  I continued this morning appears.  Found a swath of sigmoid colon going up into a dilated left indirect inguinal hernia.  Please note the lysis of adhesions to 90 minutes to ultimately free all this off.  I decide use a transabdominal preperitoneal approach (TAPP).  I scored the peritoneum transversely just below the umbilicus.  I used sharp and blunt dissection to free the peritoneum off bilateral lower quadrants and the pubic rim.  I focused on the right side.  I could see a swath of fat and epiploic appendages going into the medial hernia just above the pubic rim consistent with a recurrent direct hernia.  I freed and reduced that down.  I skeletonized some peritoneal hernia sac in the internal ring and freed that down proximally as well.  I did dissection in a similar, there are-image fashion on the left side.  Patient had a much more obvious hernia in the internal ring consistent with an indirect hernia.  Moderate size cord lipomas were skeletonized and morcellated and removed.  I chose 15 x 15 cm ultralight weight polypropylene mesh.  I cut a curved slit in one side of the mesh.  I laid the mesh as overlapping mirror-image diamonds such  that a 6 x 6 cm medial and inferior flap resting in the true pelvis in the pelvic sidewall covering the obturator and femoral foramina.  Direct space and internal rings were covered.  I used absorbable SecureStrap tacks to help hold the mesh upon the pubic rim given the larger side on the left side.  I then tacked the peritoneum back up to cover the mesh.  Initially the right lower quadrant mesh was somewhat exposed.  I freed small bowel off the peritoneum and got better mobility to bring more peritoneum off and cover that well.  Next I turned attention towards the ventral wall hernia repair  I could see the swiss cheeses hernias in the supraumbilical  abdomen.   mapped out the region using a needle passer.   To ensure that I would have at least 5 cm radial coverage outside of the hernia defect, I chose a 20x15 cm dual sided mesh.  I placed #1 Prolene stitches around its edge about every 5 cm = 12 total.  I rolled the mesh & placed into the peritoneal cavity through the 10 cm fascial defect.  I unrolled  the mesh and positioned it appropriately.  I secured the mesh to cover up the hernia defect using a laparoscopic suture passer to pass the tails of the Prolene through the abdominal wall & tagged them with clamps.  I started out in four corners to make sure I had the mesh centered  over the hernia defect appropriately, and then proceeded to work in quadrants.  We evacuated CO2 & desufflated the abdomen.  I tied the fascial stitches down.  I reinsufflated the abdomen.  The mesh provided at least 5-10 cm circumferential coverage around the entire region of hernia defects.   I tacked the edges & central part of the mesh with SecureStrap absorbable tacks.   Hemostasis was excellent.  I closed the fascia port site on the 10 mm port using a 0 Vicryl stitch using laparoscopic intracorporeal suture passer. I then placed On-Q catheter sheaths in the preperitoneal plane under direct laparoscopic guidance from the  subxiphoid region down to the posterior iliac crests in the lower flanks. I did reinspection. Hemostasis was good. Mesh laid well. Capnoperitoneum was evacuated. Ports were removed. The skin was closed with Monocryl at the port sites and Steri-Strips on the fascial stitch puncture sites. OnQ catheters were placed and the sheathes peeled away. On-Q pump was secured. Patient is being extubated to go to the recovery room. I'm about discussed operative findings with the patient's family.

## 2012-09-22 NOTE — Plan of Care (Signed)
Problem: Phase I Progression Outcomes Goal: Tubes/drains patent Outcome: Completed/Met Date Met:  09/22/12 On-Q

## 2012-09-22 NOTE — Interval H&P Note (Signed)
History and Physical Interval Note:  09/22/2012 10:58 AM  Jordan Long.  has presented today for surgery, with the diagnosis of incisional ventral wall and recurrent left inguinal hernia   The various methods of treatment have been discussed with the patient and family. After consideration of risks, benefits and other options for treatment, the patient has consented to  Procedure(s): LAPAROSCOPIC exploration and repair of incisional ventral wall INCISIONAL HERNIA and left inguinal hernia in abdominal  (N/A) INSERTION OF MESH (N/A) as a surgical intervention .  The patient's history has been reviewed, patient examined, no change in status, stable for surgery.  I have reviewed the patient's chart and labs.  Questions were answered to the patient's satisfaction.     Jordan Ancheta C.

## 2012-09-23 ENCOUNTER — Telehealth: Payer: Self-pay | Admitting: *Deleted

## 2012-09-23 ENCOUNTER — Telehealth (INDEPENDENT_AMBULATORY_CARE_PROVIDER_SITE_OTHER): Payer: Self-pay | Admitting: *Deleted

## 2012-09-23 ENCOUNTER — Other Ambulatory Visit (INDEPENDENT_AMBULATORY_CARE_PROVIDER_SITE_OTHER): Payer: Self-pay | Admitting: *Deleted

## 2012-09-23 ENCOUNTER — Encounter (HOSPITAL_COMMUNITY): Payer: Self-pay | Admitting: Surgery

## 2012-09-23 DIAGNOSIS — K432 Incisional hernia without obstruction or gangrene: Secondary | ICD-10-CM | POA: Diagnosis present

## 2012-09-23 MED ORDER — TAMSULOSIN HCL 0.4 MG PO CAPS: 0.4000 mg | ORAL_CAPSULE | Freq: Every day | ORAL | Status: DC

## 2012-09-23 NOTE — Discharge Summary (Signed)
Physician Discharge Summary  Patient ID: Jordan Long. MRN: [id number] DOB/AGE: 1951/01/08 62 y.o.  Admit date: 09/22/2012 Discharge date: 09/23/2012  Admission Diagnoses:  Discharge Diagnoses:  Principal Problem:   Recurrent bilateral inguinal hernias (BIH) s/p lap repair 09/22/2012 Active Problems:   Recurrent ventral incisional hernia s/p lap VWH repair   Discharged Condition: good  Hospital Course: Patient recurrent ventral incisional and inguinal hernias.  Underwent laparoscopic repair with mesh.  Due to the pain and urinary issues, kept overnight.  Postoperatively, the patient mobilized and advanced to a solid diet gradually.  Pain was well-controlled and transitioned off IV medications.     By the time of discharge, the patient was walking well the hallways, eating food well, having flatus.  Pain was-controlled on an oral regimen.  Based on meeting DC criteria and recovering well, I felt it was safe for the patient to be discharged home with close followup.  Instructions were discussed in detail.  They are written as well.Discuss with him holding his anticoagulation (Xerelto PO) until at least 48 hours = tomorrow a.m.  He agreed.     Consults: None  Significant Diagnostic Studies:   Treatments: surgery: Laparoscopic ventral wall hernia and bilateral inguinal hernia repairs with mesh  Discharge Exam: Blood pressure 135/73, pulse 72, temperature 98.3 F (36.8 C), temperature source Oral, resp. rate 16, height 5\' 10"  (1.778 m), weight 178 lb 12.7 oz (81.1 kg), SpO2 99.00%.  General: Pt awake/alert/oriented x4 in no major acute distress Eyes: PERRL, normal EOM. Sclera nonicteric Neuro: CN II-XII intact w/o focal sensory/motor deficits. Lymph: No head/neck/groin lymphadenopathy Psych:  No delerium/psychosis/paranoia HENT: Normocephalic, Mucus membranes moist.  No thrush Neck: Supple, No tracheal deviation Chest: No pain.  Good respiratory excursion. CV:  Pulses  intact.  Regular rhythm MS: Normal AROM mjr joints.  No obvious deformity Abdomen: Soft, Nondistended.  Dressings clean dry and intact.  No hematoma or ecchymosis.  Nontender.  No incarcerated hernias. Ext:  SCDs BLE.  No significant edema.  No cyanosis Skin: No petechiae / purpura   Disposition: 01-Home or Self Care  Discharge Orders   Future Appointments Provider Department Dept Phone   09/26/2012 2:30 PM [facility] Nurse [facility], [place] [phone]   10/10/2012 9:15 AM Ardeth Sportsman, MD [facility], [place] [phone]   Future Orders Complete By Expires   Call MD for:  extreme fatigue  As directed    Call MD for:  hives  As directed    Call MD for:  persistant nausea and vomiting  As directed    Call MD for:  redness, tenderness, or signs of infection (pain, swelling, redness, odor or green/yellow discharge around incision site)  As directed    Call MD for:  severe uncontrolled pain  As directed    Call MD for:  As directed    Comments:     Temperature > 101.24F   Diet - low sodium heart healthy  As directed    Discharge instructions  As directed    Comments:     Please see discharge instruction sheets.  Also refer to handout given an office.  Please call our office if you have any questions or concerns [phone]   Discharge wound care:  As directed    Comments:     If you have closed incisions, shower and bathe over these incisions with soap and water every day.  Remove all surgical dressings on postoperative day #4, Monday morning.  This includes  the OnQ pump/catheters/skin tapes/dressings - everything.  You do not need to replace dressings over the closed incisions unless you feel more comfortable with a Band-Aid covering it.   If you have an open wound that requires packing, please see wound care instructions.  In general, remove all dressings, wash wound with soap and water and then replace with saline moistened gauze.  Do the dressing change at  least every day.  Please call our office [phone] if you have further questions.   Driving Restrictions  As directed    Comments:     No driving until off narcotics and can safely swerve away without pain during an emergency   Increase activity slowly  As directed    Comments:     Walk an hour a day.  Use 20-30 minute walks.  When you can walk 30 minutes without difficulty, increase to low impact/moderate activities such as biking, jogging, swimming, sexual activity..  Eventually can increase to unrestricted activity when not feeling pain.  If you feel pain: STOP!Marland Kitchen   Let pain protect you from overdoing it.  Use ice/heat/over-the-counter pain medications to help minimize his soreness.  Use pain prescriptions as needed to remain active.  It is better to take extra pain medications and be more active than to stay bedridden to avoid all pain medications.   Lifting restrictions  As directed    Comments:     Avoid heavy lifting initially.  Do not push through pain.  You have no specific weight limit.  Coughing and sneezing or four more stressful to your incision than any lifting you will do. Pain will protect you from injury.  Therefore, avoid intense activity until off all narcotic pain medications.  Coughing and sneezing or four more stressful to your incision than any lifting he will do.   May shower / Bathe  As directed    May walk up steps  As directed    Sexual Activity Restrictions  As directed    Comments:     Sexual activity as tolerated.  Do not push through pain.  Pain will protect you from injury.   Walk with assistance  As directed    Comments:     Walk over an hour a day.  May use a walker/cane/companion to help with balance and stamina.       Medication List    STOP taking these medications       LOVENOX IJ      TAKE these medications       acetaminophen 500 MG tablet  Commonly known as:  TYLENOL  Take 500 mg by mouth every 6 (six) hours as needed for pain.     Calcium  + D3 600-200 MG-UNIT Tabs  Take 1 tablet by mouth daily.     methocarbamol 750 MG tablet  Commonly known as:  ROBAXIN  Take 1 tablet (750 mg total) by mouth 4 (four) times daily as needed (use for muscle cramps/pain).     multivitamin with minerals Tabs tablet  Take 1 tablet by mouth daily.     oxyCODONE 5 MG immediate release tablet  Commonly known as:  Oxy IR/ROXICODONE  Take 1-2 tablets (5-10 mg total) by mouth every 4 (four) hours as needed for pain.     XARELTO PO  Take 20 mg by mouth daily.           Follow-up Information   Follow up with Ardeth Sportsman., MD. Schedule an appointment as soon as possible for  a visit in 3 weeks.   Specialty:  General Surgery   Contact information:   [address] [place] [address] [phone]       Signed: Ardeth Sportsman. 09/23/2012, 3:50 PM

## 2012-09-23 NOTE — Telephone Encounter (Signed)
Dr. Michaell Cowing called back and approved the Flomax.  Brother updated at this time and prescription escribed to pharmacy.

## 2012-09-23 NOTE — Progress Notes (Signed)
Patient d/c home. Pt had some trouble voiding last night, resulting in an I&O. Pt voided without difficulty x3 this morning. Pain minimal. Vital signs stable. Tolerating a regular diet, with no nausea or discomfort. Script for meds and information on On-Q provided. Pt understands the need to make a follow-up appt with Dr. Michaell Cowing.

## 2012-09-23 NOTE — Telephone Encounter (Signed)
Brother called to state Dr. Michaell Cowing had offered patient Flomax yesterday at the hospital but patient didn't really know what it was for so he declined.  Patient has now changed his mind and asking for the prescription for Flomax. Dr. Michaell Cowing paged at this time to ask.  Brother states understanding and agreeable with plan at this time.

## 2012-09-23 NOTE — Progress Notes (Signed)
Utilization review completed.  

## 2012-09-23 NOTE — Progress Notes (Signed)
Nutrition Brief Note  Patient identified on the Malnutrition Screening Tool (MST) Report  Wt Readings from Last 15 Encounters:  09/23/12 178 lb 12.7 oz (81.1 kg)  09/23/12 178 lb 12.7 oz (81.1 kg)  09/19/12 179 lb (81.194 kg)  08/22/12 179 lb 6.4 oz (81.375 kg)  06/29/12 183 lb (83.008 kg)  06/08/12 184 lb 6.4 oz (83.643 kg)  03/23/12 188 lb 12.8 oz (85.639 kg)  03/21/12 184 lb (83.462 kg)  03/15/12 184 lb 9.6 oz (83.734 kg)  03/15/12 186 lb 6.4 oz (84.55 kg)  02/01/12 184 lb 6.4 oz (83.643 kg)  10/07/11 177 lb 6.4 oz (80.468 kg)  09/14/11 182 lb (82.555 kg)  08/19/11 181 lb (82.101 kg)  08/17/11 183 lb 4.8 oz (83.144 kg)    Body mass index is 25.65 kg/(m^2). Patient meets criteria for Overweight based on current BMI.   Current diet order is Regular, patient is consuming approximately 95% of meals at this time. Pt reports his appetite is good and he ate two breakfasts this morning. Pt denies wt loss stating that he usually weighs around 180 lbs. Pt reports eating well PTA with 3 good meals daily. Pt has no questions or concerns. Labs and medications reviewed.   No nutrition interventions warranted at this time. If nutrition issues arise, please consult RD.   Ian Malkin RD, LDN Inpatient Clinical Dietitian Pager: [phone] After Hours Pager: [phone]

## 2012-09-23 NOTE — Telephone Encounter (Signed)
Pt just had surgery 8/21- hernia- unable to void at this time- sister calls for advice, she is ask to call dr Gordy Savers office for assistance, she is agreeable

## 2012-09-26 ENCOUNTER — Ambulatory Visit (INDEPENDENT_AMBULATORY_CARE_PROVIDER_SITE_OTHER): Payer: Managed Care, Other (non HMO) | Admitting: General Surgery

## 2012-09-26 ENCOUNTER — Encounter (INDEPENDENT_AMBULATORY_CARE_PROVIDER_SITE_OTHER): Payer: Self-pay | Admitting: General Surgery

## 2012-09-26 VITALS — BP 134/80 | HR 62 | Temp 97.6°F | Resp 16 | Ht 70.0 in | Wt 184.0 lb

## 2012-09-26 DIAGNOSIS — Z9889 Other specified postprocedural states: Secondary | ICD-10-CM

## 2012-09-26 DIAGNOSIS — Z8719 Personal history of other diseases of the digestive system: Secondary | ICD-10-CM

## 2012-09-26 NOTE — Progress Notes (Signed)
Patient comes in today S/P Lap Exp and Ventral hernia repair along with [facility] repair for pain pump removal..Jordan KitchenDeanna Swaney pulled pain pump intact and dry gauze placed over insert..patient tolerated well and will keep follow up appt on 9/8@9 :00 with SG

## 2012-09-29 ENCOUNTER — Telehealth (INDEPENDENT_AMBULATORY_CARE_PROVIDER_SITE_OTHER): Payer: Self-pay | Admitting: General Surgery

## 2012-09-29 NOTE — Telephone Encounter (Signed)
Pt's sister called for advice after pt has has only one small BM since surgery (now POD #7.)  He states he is walking around the block at least once a day, sometimes twice a day, is taking his stool softener and is not passing gas.  He states he is eating and drinking well.  Today he took 3 Dulcolax pills, but has still not had the urge to defecate.  Denies bloating; only normal swelling noted.  Recommended he take 4 tbsp MOM and take in only clear liquids until his bowels have moved.  He and family understand and will comply.

## 2012-09-29 NOTE — Telephone Encounter (Signed)
Sister called back to report that pt has not voided since 1-2:00 am today.  He has now taken the MOM and is out walking in the yard.  Paged and updated Dr. Michaell Cowing; recommends sitting in warm tub to try to void.  Call back with update by 2:00 pm today.  Family understands and will comply.

## 2012-09-29 NOTE — Telephone Encounter (Signed)
Pt called back as requested to report he still cannot void his bladder.  Paged and updated Dr. Michaell Cowing.  Will attempt to have [facility] see him urgently this afternoon.

## 2012-09-29 NOTE — Telephone Encounter (Signed)
Called sister Radford Pax) to check that [facility] had been able to reach them.  No answer; left message to call with update.

## 2012-09-30 ENCOUNTER — Telehealth (INDEPENDENT_AMBULATORY_CARE_PROVIDER_SITE_OTHER): Payer: Self-pay | Admitting: *Deleted

## 2012-09-30 ENCOUNTER — Other Ambulatory Visit (INDEPENDENT_AMBULATORY_CARE_PROVIDER_SITE_OTHER): Payer: Self-pay | Admitting: *Deleted

## 2012-09-30 MED ORDER — HYDROCODONE-ACETAMINOPHEN 5-325 MG PO TABS: 1.0000 | ORAL_TABLET | Freq: Four times a day (QID) | ORAL | Status: DC | PRN

## 2012-09-30 NOTE — Telephone Encounter (Signed)
Sister called to request a refill of pain medications to make sure patient has enough to get through the holiday weekend.  She states patient has 10 Oxycodone left.  Advised of protocol for Norco, sister is agreeable at this time.  Prescription called into [facility] [phone]; Norco 5/325mg  Take 1 tablet every 4-6 hours as needed for pain #30 no refills.

## 2012-10-10 ENCOUNTER — Encounter (INDEPENDENT_AMBULATORY_CARE_PROVIDER_SITE_OTHER): Payer: Self-pay | Admitting: Surgery

## 2012-10-10 ENCOUNTER — Ambulatory Visit (INDEPENDENT_AMBULATORY_CARE_PROVIDER_SITE_OTHER): Payer: Managed Care, Other (non HMO) | Admitting: Surgery

## 2012-10-10 VITALS — BP 112/58 | HR 68 | Resp 14 | Ht 70.0 in | Wt 175.6 lb

## 2012-10-10 DIAGNOSIS — K4021 Bilateral inguinal hernia, without obstruction or gangrene, recurrent: Secondary | ICD-10-CM

## 2012-10-10 DIAGNOSIS — K432 Incisional hernia without obstruction or gangrene: Secondary | ICD-10-CM

## 2012-10-10 NOTE — Patient Instructions (Addendum)
HERNIA REPAIR: POST OP INSTRUCTIONS  1. DIET: Follow a light bland diet the first 24 hours after arrival home, such as soup, liquids, crackers, etc.  Be sure to include lots of fluids daily.  Avoid fast food or heavy meals as your are more likely to get nauseated.  Eat a low fat the next few days after surgery. 2. Take your usually prescribed home medications unless otherwise directed. 3. PAIN CONTROL: a. Pain is best controlled by a usual combination of three different methods TOGETHER: i. Ice/Heat ii. Over the counter pain medication iii. Prescription pain medication b. Most patients will experience some swelling and bruising around the hernia(s) such as the bellybutton, groins, or old incisions.  Ice packs or heating pads (30-60 minutes up to 6 times a day) will help. Use ice for the first few days to help decrease swelling and bruising, then switch to heat to help relax tight/sore spots and speed recovery.  Some people prefer to use ice alone, heat alone, alternating between ice & heat.  Experiment to what works for you.  Swelling and bruising can take several weeks to resolve.   c. It is helpful to take an over-the-counter pain medication regularly for the first few weeks.  Choose one of the following that works best for you: i. Naproxen (Aleve, etc)  Two 220mg tabs twice a day ii. Ibuprofen (Advil, etc) Three 200mg tabs four times a day (every meal & bedtime) iii. Acetaminophen (Tylenol, etc) 325-650mg four times a day (every meal & bedtime) d. A  prescription for pain medication should be given to you upon discharge.  Take your pain medication as prescribed.  i. If you are having problems/concerns with the prescription medicine (does not control pain, nausea, vomiting, rash, itching, etc), please call us [phone] to see if we need to switch you to a different pain medicine that will work better for you and/or control your side effect better. ii. If you need a refill on your pain  medication, please contact your pharmacy.  They will contact our office to request authorization. Prescriptions will not be filled after 5 pm or on week-ends. 4. Avoid getting constipated.  Between the surgery and the pain medications, it is common to experience some constipation.  Increasing fluid intake and taking a fiber supplement (such as Metamucil, Citrucel, FiberCon, MiraLax, etc) 1-2 times a day regularly will usually help prevent this problem from occurring.  A mild laxative (prune juice, Milk of Magnesia, MiraLax, etc) should be taken according to package directions if there are no bowel movements after 48 hours.   5. Wash / shower every day.  You may shower over the dressings as they are waterproof.   6. Remove your waterproof bandages 5 days after surgery.  You may leave the incision open to air.  You may replace a dressing/Band-Aid to cover the incision for comfort if you wish.  Continue to shower over incision(s) after the dressing is off.    7. ACTIVITIES as tolerated:   a. You may resume regular (light) daily activities beginning the next day-such as daily self-care, walking, climbing stairs-gradually increasing activities as tolerated.  If you can walk 30 minutes without difficulty, it is safe to try more intense activity such as jogging, treadmill, bicycling, low-impact aerobics, swimming, etc. b. Save the most intensive and strenuous activity for last such as sit-ups, heavy lifting, contact sports, etc  Refrain from any heavy lifting or straining until you are off narcotics for pain control.     c. DO NOT PUSH THROUGH PAIN.  Let pain be your guide: If it hurts to do something, don't do it.  Pain is your body warning you to avoid that activity for another week until the pain goes down. d. You may drive when you are no longer taking prescription pain medication, you can comfortably wear a seatbelt, and you can safely maneuver your car and apply brakes. e. Jordan Long may have sexual intercourse  when it is comfortable.  8. FOLLOW UP in our office a. Please call [facility] at [phone] to set up an appointment to see your surgeon in the office for a follow-up appointment approximately 2-3 weeks after your surgery. b. Make sure that you call for this appointment the day you arrive home to insure a convenient appointment time. 9.  IF YOU HAVE DISABILITY OR FAMILY LEAVE FORMS, BRING THEM TO THE OFFICE FOR PROCESSING.  DO NOT GIVE THEM TO YOUR DOCTOR.  WHEN TO CALL us [phone]: 1. Poor pain control 2. Reactions / problems with new medications (rash/itching, nausea, etc)  3. Fever over 101.5 F (38.5 C) 4. Inability to urinate 5. Nausea and/or vomiting 6. Worsening swelling or bruising 7. Continued bleeding from incision. 8. Increased pain, redness, or drainage from the incision   The clinic staff is available to answer your questions during regular business hours (8:30am-5pm).  Please don't hesitate to call and ask to speak to one of our nurses for clinical concerns.   If you have a medical emergency, go to the nearest emergency room or call 911.  A surgeon from [facility] is always on call at the hospitals in [facility], [place] [address], [address], [place], [place]  [address] ?  [address] [address], [place], [place]   [address] MAIN: [phone] ? TOLL FREE: [phone] ? FAX: [phone] [email or web address]  Osteoarthritis Osteoarthritis is the most common form of arthritis. It is redness, soreness, and swelling (inflammation) affecting the cartilage. Cartilage acts as a cushion, covering the ends of bones where they meet to form a joint. CAUSES  Over time, the cartilage begins to wear away. This causes bone to rub on bone. This produces pain and stiffness in the affected joints. Factors that contribute to this problem are:  Excessive body weight.  Age.  Overuse of joints. SYMPTOMS   People with osteoarthritis  usually experience joint pain, swelling, or stiffness.  Over time, the joint may lose its normal shape.  Small deposits of bone (osteophytes) may grow on the edges of the joint.  Bits of bone or cartilage can break off and float inside the joint space. This may cause more pain and damage.  Osteoarthritis can lead to depression, anxiety, feelings of helplessness, and limitations on daily activities. The most commonly affected joints are in the:  Ends of the fingers.  Thumbs.  Neck.  Lower back.  Knees.  Hips. DIAGNOSIS  Diagnosis is mostly based on your symptoms and exam. Tests may be helpful, including:  X-rays of the affected joint.  A computerized magnetic scan (MRI).  Blood tests to rule out other types of arthritis.  Joint fluid tests. This involves using a needle to draw fluid from the joint and examining the fluid under a microscope. TREATMENT  Goals of treatment are to control pain, improve joint function, maintain a normal body weight, and maintain a healthy lifestyle. Treatment approaches may include:  A prescribed exercise program with rest and joint relief.  Weight control with  nutritional education.  Pain relief techniques such as:  Properly applied heat and cold.  Electric pulses delivered to nerve endings under the skin (transcutaneous electrical nerve stimulation, TENS).  Massage.  Certain supplements. Ask your caregiver before using any supplements, especially in combination with prescribed drugs.  Medicines to control pain, such as:  Acetaminophen.  Nonsteroidal anti-inflammatory drugs (NSAIDs), such as naproxen.  Narcotic or central-acting agents, such as tramadol. This drug carries a risk of addiction and is generally prescribed for short-term use.  Corticosteroids. These can be given orally or as injection. This is a short-term treatment, not recommended for routine use.  Surgery to reposition the bones and relieve pain (osteotomy) or to  remove loose pieces of bone and cartilage. Joint replacement may be needed in advanced states of osteoarthritis. HOME CARE INSTRUCTIONS  Your caregiver can recommend specific types of exercise. These may include:  Strengthening exercises. These are done to strengthen the muscles that support joints affected by arthritis. They can be performed with weights or with exercise bands to add resistance.  Aerobic activities. These are exercises, such as brisk walking or low-impact aerobics, that get your heart pumping. They can help keep your lungs and circulatory system in shape.  Range-of-motion activities. These keep your joints limber.  Balance and agility exercises. These help you maintain daily living skills. Learning about your condition and being actively involved in your care will help improve the course of your osteoarthritis. SEEK MEDICAL CARE IF:   You feel hot or your skin turns red.  You develop a rash in addition to your joint pain.  You have an oral temperature above 102 F (38.9 C). FOR MORE INFORMATION  National Institute of Arthritis and Musculoskeletal and Skin Diseases: www.niams.http://www.myers.net/ [employer] on Aging: https://walker.com/ American College of Rheumatology: www.rheumatology.org Document Released: 01/19/2005 Document Revised: 04/13/2011 Document Reviewed: 05/02/2009 [facility] Patient Information 2014 [place], [place].

## 2012-10-10 NOTE — Progress Notes (Signed)
Subjective:     Patient ID: Jordan Staff., male   DOB: [birth year], 62 y.o.   MRN: [id number]  HPI  Jordan Long  [birth year] [id number]  Patient Care Team: Windell Hummingbird, MD as PCP - General Othella Boyer, MD as Consulting Physician (Cardiology) Ardeth Sportsman, MD as Consulting Physician (General Surgery) Lindaann Slough, MD as Consulting Physician (Urology)  Procedure (Date: 09/22/2012):   POST-OPERATIVE DIAGNOSIS: incisional vnetral wall, right inguinal and recurrrent left inguinal hernias  PROCEDURE: Procedure(s):  INICISIONAL HERNIA (RECURRENT) REPAIR LAPRASCOPIC  INSERTION OF MESH  LAPAROSCOPIC LYSIS OF ADHESIONS 90 minutes  LAPAROSCOPIC BILATERAL INGUINAL HERNIA REPAIR   This patient returns for surgical evaluation.  He struggled with urinary retention.  Seen by [facility].  Just completed voiding trial.  He is back to walking more than an hour a day.  Using his right knee brace.  He is wearing a binder on his abdomen.  No fevers or chills.  No constipation.  Overall in good spirits.  He is appreciative of surgical repairs.     Patient Active Problem List   Diagnosis Date Noted  . 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 Recurrent bilateral inguinal hernias (BIH) s/p lap repair 09/22/2012 06/29/2012  . Colon cancer screening 02/01/2012  . Right knee pain 09/14/2011  . Compression fracture 08/17/2011  . Back pain 07/13/2011  . Knee pain, bilateral 07/13/2011  . GERD (gastroesophageal reflux disease) 07/13/2011  . Anxiety 03/02/2011  . Postural dizziness 03/02/2011  . Numbness and tingling in hands 02/22/2011  . 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 DVT (deep venous thrombosis) 11/20/2010  . Constipation 11/20/2010  . Incisional hernia 08/20/2010    Past Medical History  Diagnosis Date  . DVT (deep venous thrombosis) 2010 and 10/2010    after Hernia repair in 2010 s/p IVC filter and coumadin. Next episode after Hernia repair  (  10/23/2010) . Admitted on 11/02/2010 for repeat DVT. Warfarin was stopped for  5 days before surgery.   . Incisional hernia 08/20/2010  . Constipation 11/20/2010  . GERD (gastroesophageal reflux disease)   . Pulmonary embolism 2010  . DVT (deep venous thrombosis) 10/2011    right leg after knee surgery    Past Surgical History  Procedure Laterality Date  . Hernia repair  2010  . Hernia repair  10/23/2010    Incisional hernia w/mesh  . Hernia repair    . Septic abdominal surgery  2010  . Knee surgery Right 10/2011    [facility]  . Femoral artery stent Right 2010  . Tonsillectomy  as child  . Appendectomy  age 1  . Incisional hernia repair N/A 09/22/2012    Procedure: INICISIONAL HERNIA AND BILATERAL RECURRENT INGUINAL HERNIA  REPAIRED LAPRASCOPIC ;  Surgeon: Ardeth Sportsman, MD;  Location: [facility];  Service: General;  Laterality: N/A;  . Insertion of mesh Bilateral 09/22/2012    Procedure: INSERTION OF MESH;  Surgeon: Ardeth Sportsman, MD;  Location: [facility];  Service: General;  Laterality: Bilateral;  FOR REPAIR OF BILATERAL INGUINAL HERNIAS AND ALSO MESH INSERTED FOR VENTRAL HERNIA REPAIR   . Laparoscopic lysis of adhesions N/A 09/22/2012    Procedure: LAPAROSCOPIC LYSIS OF ADHESIONS 90 minutes ;  Surgeon: Ardeth Sportsman, MD;  Location: [facility];  Service: General;  Laterality: N/A;  . Inguinal hernia repair Bilateral 09/22/2012    Procedure: LAPAROSCOPIC BILATERAL INGUINAL HERNIA REPAIR;  Surgeon: Ardeth Sportsman, MD;  Location: [facility];  Service: General;  Laterality: Bilateral;  RECURRENT INGUINAL HERNIAS BILATERAL     History   Social History  . Marital Status: Single    Spouse Name: N/A    Number of Children: 2  . Years of Education: N/A   Occupational History  . retired [employer]   Social History Main Topics  . Smoking status: Former Smoker -- 2 years    Types: Cigarettes    Quit date: 09/23/1985  . Smokeless tobacco: Never Used  . Alcohol Use: No  . Drug Use: No  .  Sexual Activity: Not on file   Other Topics Concern  . Not on file   Social History Narrative  . No narrative on file    Family History  Problem Relation Age of Onset  . Cancer Father     Lymphoma   . Raynaud syndrome Father   . Diabetes Mother   . Parkinsonism Mother   . Thyroid disease Sister   . Diabetes Sister   . Raynaud syndrome Sister   . Lupus Sister   . Raynaud syndrome Sister   . Colon cancer Neg Hx   . Heart disease Mother   . Heart attack      neice    Current Outpatient Prescriptions  Medication Sig Dispense Refill  . acetaminophen (TYLENOL) 500 MG tablet Take 500 mg by mouth every 6 (six) hours as needed for pain.       . Calcium Carb-Cholecalciferol (CALCIUM + D3) 600-200 MG-UNIT TABS Take 1 tablet by mouth daily.      Marland Kitchen HYDROcodone-acetaminophen (NORCO) 5-325 MG per tablet Take 1 tablet by mouth every 6 (six) hours as needed for pain (Every 4-6 hours PRN).  30 tablet  0  . methocarbamol (ROBAXIN) 750 MG tablet Take 1 tablet (750 mg total) by mouth 4 (four) times daily as needed (use for muscle cramps/pain).  30 tablet  2  . Multiple Vitamin (MULITIVITAMIN WITH MINERALS) TABS Take 1 tablet by mouth daily.      . Rivaroxaban (XARELTO PO) Take 20 mg by mouth daily.       . tamsulosin (FLOMAX) 0.4 MG CAPS capsule Take 1 capsule (0.4 mg total) by mouth daily.  10 capsule  2  . oxyCODONE (OXY IR/ROXICODONE) 5 MG immediate release tablet        No current facility-administered medications for this visit.     No Known Allergies  BP 112/58  Pulse 68  Resp 14  Ht 5\' 10"  (1.778 m)  Wt 175 lb 9.6 oz (79.652 kg)  BMI 25.2 kg/m2  No results found.  Review of Systems  Constitutional: Negative for fever, chills and diaphoresis.  HENT: Negative for nosebleeds, sore throat, facial swelling, mouth sores, trouble swallowing, neck pain and ear discharge.   Eyes: Negative for photophobia, discharge and visual disturbance.  Respiratory: Negative for choking, chest  tightness, shortness of breath and stridor.   Cardiovascular: Negative for chest pain and palpitations.  Gastrointestinal: Negative for nausea, vomiting, abdominal pain, diarrhea, constipation, blood in stool, abdominal distention, anal bleeding and rectal pain.  Endocrine: Negative for cold intolerance and heat intolerance.  Genitourinary: Positive for scrotal swelling and difficulty urinating. Negative for dysuria, urgency, hematuria, discharge and testicular pain.  Musculoskeletal: Negative for myalgias, back pain, arthralgias and gait problem.  Skin: Negative for color change, pallor, rash and wound.  Allergic/Immunologic: Negative for environmental allergies and food allergies.  Neurological: Negative for dizziness, speech difficulty, weakness, numbness and headaches.  Hematological: Negative for adenopathy. Does not bruise/bleed  easily.  Psychiatric/Behavioral: Negative for hallucinations, confusion and agitation.       Objective:   Physical Exam  Constitutional: He is oriented to person, place, and time. He appears well-developed and well-nourished. No distress.  HENT:  Head: Normocephalic.  Mouth/Throat: Oropharynx is clear and moist. No oropharyngeal exudate.  Eyes: Conjunctivae and EOM are normal. Pupils are equal, round, and reactive to light. No scleral icterus.  Neck: Normal range of motion. No tracheal deviation present.  Cardiovascular: Normal rate, normal heart sounds and intact distal pulses.   Pulmonary/Chest: Effort normal. No respiratory distress.  Abdominal: Soft. He exhibits no distension. There is no tenderness. Hernia confirmed negative in the right inguinal area and confirmed negative in the left inguinal area.  Some dressings/Steri-Strips straining.  I removed them.  Old scarring from prior midline intact but no cellulitis.  Incisions clean with normal healing ridges.  No hernias  Musculoskeletal: Normal range of motion. He exhibits no tenderness.  Neurological:  He is alert and oriented to person, place, and time. No cranial nerve deficit. He exhibits normal muscle tone. Coordination normal.  Skin: Skin is warm and dry. No rash noted. He is not diaphoretic.  Psychiatric: He has a normal mood and affect. His behavior is normal.       Assessment:     Two and half weeks status post repair of recurrent hernias.  Recovering relatively well with an amazing attitude despite issues of urinary retention     Plan:     Increase activity as tolerated to regular activity.  Low impact exercise such as walking an hour a day at least ideal.  Do not push through pain.  I am glad that he continues to exercise regularly  Diet as tolerated.  Low fat high fiber diet ideal.  Bowel regimen with 30 g fiber a day and fiber supplement as needed to avoid problems.  Return to clinic as needed.   Instructions discussed.  Followup with primary care physician for other health issues as would normally be done.  Questions answered.  The patient expressed understanding and appreciation

## 2012-12-07 ENCOUNTER — Encounter: Payer: Managed Care, Other (non HMO) | Admitting: Internal Medicine

## 2012-12-09 ENCOUNTER — Ambulatory Visit (INDEPENDENT_AMBULATORY_CARE_PROVIDER_SITE_OTHER): Payer: Managed Care, Other (non HMO) | Admitting: Internal Medicine

## 2012-12-09 ENCOUNTER — Encounter: Payer: Self-pay | Admitting: Internal Medicine

## 2012-12-09 VITALS — BP 131/77 | HR 57 | Temp 96.6°F | Ht 70.0 in | Wt 180.0 lb

## 2012-12-09 DIAGNOSIS — M25569 Pain in unspecified knee: Secondary | ICD-10-CM

## 2012-12-09 DIAGNOSIS — Z0000 Encounter for general adult medical examination without abnormal findings: Secondary | ICD-10-CM

## 2012-12-09 DIAGNOSIS — Z1211 Encounter for screening for malignant neoplasm of colon: Secondary | ICD-10-CM

## 2012-12-09 DIAGNOSIS — Z515 Encounter for palliative care: Secondary | ICD-10-CM | POA: Insufficient documentation

## 2012-12-09 DIAGNOSIS — K219 Gastro-esophageal reflux disease without esophagitis: Secondary | ICD-10-CM

## 2012-12-09 DIAGNOSIS — M25561 Pain in right knee: Secondary | ICD-10-CM

## 2012-12-09 MED ORDER — OMEPRAZOLE 20 MG PO CPDR: 20.0000 mg | DELAYED_RELEASE_CAPSULE | Freq: Every day | ORAL | Status: DC

## 2012-12-09 MED ORDER — ZOSTER VACCINE LIVE 19400 UNT/0.65ML ~~LOC~~ SOLR: 0.6500 mL | Freq: Once | SUBCUTANEOUS | Status: DC

## 2012-12-09 NOTE — Progress Notes (Signed)
Patient ID: Jordan Gintz., male   DOB: [birth year], 62 y.o.   MRN: [id number] HPI The patient is a 62 y.o. 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  Patient reports that overall he is doing well. He had a laparoscopic incisional hernia repair with mesh, lysis of adhesions, bilateral inguinal hernia repairs by Dr. Michaell Cowing August 2014. He is doing well since his surgery. His bowels are moving well, no hematochezia, constipation, diarrhea, nausea, vomiting. He follows a low-fat high-fiber diet as per his general surgeon. He does have some baseline abdominal pain over his previous abdominal scars, however there has been no change to this pain recently. Patient had some difficulty voiding after his last surgery, however he is doing better and has no trouble urinating at this time, no dysuria, no hematuria.  Patient has osteoarthritis of his bilateral knees for which he is followed by sports medicine. Despite his knee pain, patient is able to walk 0.5-0.75 miles daily. Patient used to be a runner and reports he follows his exercise regimen religiously. No recent changes to his knee pain. He is wearing a knee brace on his right knee today.  Patient has history of GERD and has been taking TUMS for this as needed. He reports that sometimes the acid reflux and epigastric discomfort keeps him up at night. He has tried propping himself up on 2 pillows with some relief of symptoms. Patient usually eats his last meal of the day around 5 PM. Patient does not think his symptoms are severe, however he is interested in trying any medication for this. 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  Patient is a nonsmoker and nondrinker. His weight has been stable. BMI 25.8 today. Patient wants a flu shot today. He is overdue by our records for  colonoscopy, however patient reports he believes he had a colonoscopy within the last 10 years at an outside hospital. He plans to verify this with his sister and get back to me.  ROS: General: no fevers, chills, changes in weight, changes in appetite Skin: no rash HEENT: no blurry vision, hearing changes, sore throat Pulm: no dyspnea, coughing, wheezing CV: no chest pain, palpitations, shortness of breath Abd: see HPI GU: no dysuria, hematuria, polyuria Ext: see HPI Neuro: no weakness, numbness, or tingling  Filed Vitals:   12/09/12 0927  BP: 131/77  Pulse: 57  Temp: 96.6 F (35.9 C)   Physical Exam General: thin, pleasant elderly man who is alert, cooperative, and in no apparent distress HEENT: pupils equal round and reactive to light, vision grossly intact, oropharynx clear and non-erythematous  Neck: supple, no lymphadenopathy Lungs: clear to ascultation bilaterally, normal work of respiration, no wheezes, rales, ronchi Heart: regular rate and rhythm, no murmurs, gallops, or rubs Abdomen: soft, multiple previous, well healed surgical scars w/ some TTP over the fibrous tissue, mild TTP epigastrium, but otherwise nontender; non-distended, normal bowel sounds Extremities:warm extremities bilaterally, no BLE edema Neurologic: alert & oriented X3, cranial nerves II-XII grossly intact, strength grossly intact, sensation intact to light touch  Current Outpatient Prescriptions on File Prior to Visit  Medication Sig Dispense Refill  . acetaminophen (TYLENOL) 500 MG tablet Take 500 mg by mouth every 6 (six) hours as needed for pain.       . Calcium Carb-Cholecalciferol (CALCIUM + D3) 600-200 MG-UNIT TABS Take 1 tablet by  mouth daily.      . Multiple Vitamin (MULITIVITAMIN WITH MINERALS) TABS Take 1 tablet by mouth daily.      . Rivaroxaban (XARELTO PO) Take 20 mg by mouth daily.       . tamsulosin (FLOMAX) 0.4 MG CAPS capsule Take 1 capsule (0.4 mg total) by mouth daily.  10 capsule  2   . HYDROcodone-acetaminophen (NORCO) 5-325 MG per tablet Take 1 tablet by mouth every 6 (six) hours as needed for pain (Every 4-6 hours PRN).  30 tablet  0  . methocarbamol (ROBAXIN) 750 MG tablet Take 1 tablet (750 mg total) by mouth 4 (four) times daily as needed (use for muscle cramps/pain).  30 tablet  2  . oxyCODONE (OXY IR/ROXICODONE) 5 MG immediate release tablet        No current facility-administered medications on file prior to visit.    Assessment/Plan

## 2012-12-09 NOTE — Assessment & Plan Note (Addendum)
Patient has bilateral knee osteoarthritis. No new or worsening symptoms. He is able to walk 0.5-0.75 miles daily. Patient sees sports medicine for this pain. No changes to therapeutic regimen.

## 2012-12-09 NOTE — Assessment & Plan Note (Signed)
Patient believes he had a colonoscopy within the last 10 years. I am unable to find documentation of this in our records. Patient reports his sister helps him keep track of his medical records, he will check with her and if he has had a colonoscopy within the last 10 years he will send these records to our office. If he has not had a colonoscopy in the last 10 years, this should be scheduled at his next visit.

## 2012-12-09 NOTE — Assessment & Plan Note (Signed)
Patient describes having epigastric burning, which is worse at night. He is trying lifestyle changes such as propping himself up on pillows at night as well as eating his last meal early in the evening. He's been using TUMS at home, with little relief of symptoms. He is interested in trying a PPI today. I will start on a low dose for this patient as his symptoms do not appear to be severe. Additionally, he has no red flag signs or symptoms such as nausea, vomiting, hematemesis, dark stools, weight loss. -Omeprazole 20 mg daily -Will plan to try this for 3 months and followup with me after this time or sooner if needed.

## 2012-12-09 NOTE — Assessment & Plan Note (Signed)
Patient received flu shot today and a prescription for the herpes zoster vaccine.

## 2012-12-09 NOTE — Patient Instructions (Signed)
Thank you for your visit today.  Today I prescribed a new medication for your acid reflux. It is called omeprazole and is to be taken once daily.  Today you received a flu shot and a prescription for the herpes zoster vaccine. Please take the herpes zoster vaccine prescription to your pharmacy to obtain the shot.  Please check with your sister regarding a previous colonoscopy that you may have had at an outside hospital. If you have had a colonoscopy in the last 10 years, please send our office the records. If you have not had a colonoscopy in the last 10 years, we should schedule this at your next visit.  Please followup with me in 3 months.

## 2012-12-12 NOTE — Progress Notes (Signed)
I saw and evaluated the patient.  I personally confirmed the key portions of the history and exam documented by Dr. Chikowski and I reviewed pertinent patient test results.  The assessment, diagnosis, and plan were formulated together and I agree with the documentation in the resident's note. 

## 2013-03-07 ENCOUNTER — Other Ambulatory Visit: Payer: Self-pay | Admitting: Internal Medicine

## 2013-03-13 ENCOUNTER — Ambulatory Visit (INDEPENDENT_AMBULATORY_CARE_PROVIDER_SITE_OTHER): Payer: Managed Care, Other (non HMO) | Admitting: Internal Medicine

## 2013-03-13 ENCOUNTER — Encounter: Payer: Self-pay | Admitting: Internal Medicine

## 2013-03-13 VITALS — BP 127/77 | HR 72 | Temp 96.7°F | Ht 68.5 in | Wt 184.9 lb

## 2013-03-13 DIAGNOSIS — IMO0002 Reserved for concepts with insufficient information to code with codable children: Secondary | ICD-10-CM

## 2013-03-13 DIAGNOSIS — Z0000 Encounter for general adult medical examination without abnormal findings: Secondary | ICD-10-CM

## 2013-03-13 DIAGNOSIS — M1710 Unilateral primary osteoarthritis, unspecified knee: Secondary | ICD-10-CM

## 2013-03-13 DIAGNOSIS — Z1211 Encounter for screening for malignant neoplasm of colon: Secondary | ICD-10-CM

## 2013-03-13 DIAGNOSIS — M25561 Pain in right knee: Secondary | ICD-10-CM

## 2013-03-13 DIAGNOSIS — M25562 Pain in left knee: Secondary | ICD-10-CM

## 2013-03-13 DIAGNOSIS — K219 Gastro-esophageal reflux disease without esophagitis: Secondary | ICD-10-CM

## 2013-03-13 DIAGNOSIS — Z23 Encounter for immunization: Secondary | ICD-10-CM

## 2013-03-13 LAB — LIPID PANEL
CHOL/HDL RATIO: 3.6 ratio
Cholesterol: 204 mg/dL — ABNORMAL HIGH (ref 0–200)
HDL: 57 mg/dL (ref >39)
LDL CALC: 126 mg/dL — AB (ref 0–99)
Triglycerides: 106 mg/dL (ref <150)
VLDL: 21 mg/dL (ref 0–40)

## 2013-03-13 MED ORDER — OMEPRAZOLE 40 MG PO CPDR: 40.0000 mg | DELAYED_RELEASE_CAPSULE | Freq: Every day | ORAL | Status: DC

## 2013-03-13 NOTE — Assessment & Plan Note (Addendum)
Given patient's symptoms are persistent and he has been compliant with lifestyle modifications as well as the omeprazole 20mg  daily, we will increase the omeprazole to 40mg  daily. Of note, there are no red flag signs or symptoms at this time (no bloody stools or black/tarry stools, no weight loss, no difficulty or painful swallowing). I asked patient to return to clinic in 1-2 months if his symptoms are not improving with this new dose. If patient returns with persistent or worsening GERD symptoms, I plan to increase the omeprazole to 40mg  BID. If this does not resolve his symptoms, I will plan for another GI referral.

## 2013-03-13 NOTE — Assessment & Plan Note (Signed)
Patient is due by our records for a colonoscopy. Patient still thinks he had a colonoscopy done within the last 10 years. He still needs to ask his sister when/where his last colonoscopy was done. I asked him to check on this and to call our clinic with the updated information. I also provided patient with our fax number so that he can have his records sent over to our clinic, which patient agrees to do.   Patient received the flu shot today as it appears this was not done at his last visit.  Checking lipid panel today.   Patient did receive his zostavax at his pharmacy.

## 2013-03-13 NOTE — Patient Instructions (Signed)
Thank you for your visit.   Please return to see me in 3 months or sooner if your symptoms do not improve.  I increased your dose of omeprazole today to 40mg  daily. Please take this for at least 1 month before deciding whether or not your symptoms are improving.  Please check with your family to see if you did have a COLONOSCOPY within the last 10 years. Please call our clinic with this information to update [place]. If you have had one within the last 10 years, please fax this documentation over to [phone] (our clinic).  You received the flu shot today. We also checked your cholesterol today.  Thanks a lot!

## 2013-03-13 NOTE — Progress Notes (Signed)
Patient ID: Jordan Yang., male   DOB: Feb 16, 1950, 63 y.o.   MRN: [id number] HPI The patient is a 63 y.o. 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  OA- Patient has osteoarthritis of his bilateral knees (much worse in R knee) for which he is followed by Dr. Noemi Chapel (ortho). Despite his knee pain, patient is able to walk 0.5-0.75 miles daily. He wears a knee brace on his R knee. He says that Dr. Noemi Chapel plans to replace this knee, though he does not have an appointment for this at this time.  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 Patient had negative H pylori IgG at this time. GI recommended an 8 week course of omeprazole 40mg  BID plus carafate 1g TID, which patient was compliant with. During my last visit with the patient, he was taking only Tums to manage his symptoms, which was not adequate so I prescribed omeprazole 20mg  daily. Patient has been complaint with the omeprazole, he is no longer taking tums. Today patient states his symptoms are persistent. He has burning in his chest that feels exactly like his GERD that is worse after eating and at night/early morning. No associated belching, though he does notice a "bad taste" in his mouth in the morning. Symptoms occasionally wake him up at night, approx 1-2 times in a 2 week period. He still props himself up on two pillows at night and his last meal of the day is at 5pm. No weight loss or change in appetite. No blood in stool or dark/black stools. Denies increase in belching. He does have some baseline abd pain over his prior surgical scars, but this is at baseline.  Patient did not receive the flu shot during his last visit. However, he did receive the zostavax. He is due for a lipid panel as his last one was  02/2011.  ROS: General: no fevers, chills, changes in weight, changes in appetite Skin: no rash HEENT: no blurry vision, ST, HA; +bad taste in mouth Pulm: no dyspnea, coughing, wheezing CV: no chest pain, palpitations, shortness of breath Abd: baseline abd pain w/ some epigastric burning; nausea/vomiting, diarrhea/constipation GU: no dysuria, hematuria, polyuria Ext: no arthralgias, myalgias Neuro: no weakness, numbness, or tingling  Filed Vitals:   03/13/13 1319  BP: 127/77  Pulse: 72  Temp: 96.7 F (35.9 C)  Weight is stable   Physical Exam General: alert, cooperative, and in no apparent distress HEENT: pupils equal round and reactive to light, vision grossly intact, oropharynx clear and non-erythematous, MMM Neck: supple Lungs: clear to ascultation bilaterally, normal WOB Heart: regular rate and rhythm, no murmurs, gallops, or rubs Abdomen: soft, mild TTP over well-healed surgical scars; no TTP over epigstrium, non-distended, normal bowel sounds Extremities: warm b/l, no pedal edema; soft knee brace to R knee Neurologic: alert & oriented X3, cranial nerves II-XII grossly intact, strength grossly intact, sensation intact to light touch  Current Outpatient Prescriptions on File Prior to Visit  Medication Sig Dispense Refill  . acetaminophen (TYLENOL) 500 MG tablet Take 500 mg by mouth every 6 (six) hours as needed for pain.       . Calcium Carb-Cholecalciferol (CALCIUM + D3) 600-200 MG-UNIT TABS Take 1 tablet by mouth daily.      Marland Kitchen HYDROcodone-acetaminophen (NORCO) 5-325 MG per tablet  Take 1 tablet by mouth every 6 (six) hours as needed for pain (Every 4-6 hours PRN).  30 tablet  0  . methocarbamol (ROBAXIN) 750 MG tablet Take 1 tablet (750 mg total) by mouth 4 (four) times daily as needed (use for muscle cramps/pain).  30 tablet  2  . Multiple Vitamin (MULITIVITAMIN WITH MINERALS) TABS Take 1 tablet by mouth daily.      Marland Kitchen omeprazole (PRILOSEC) 20 MG capsule TAKE 1 CAPSULE (20  MG TOTAL) BY MOUTH DAILY.  30 capsule  2  . oxyCODONE (OXY IR/ROXICODONE) 5 MG immediate release tablet       . Rivaroxaban (XARELTO PO) Take 20 mg by mouth daily.       . tamsulosin (FLOMAX) 0.4 MG CAPS capsule Take 1 capsule (0.4 mg total) by mouth daily.  10 capsule  2  . zoster vaccine live, PF, (ZOSTAVAX) 68341 UNT/0.65ML injection Inject 19,400 Units into the skin once.  1 each  0   No current facility-administered medications on file prior to visit.    Assessment/Plan

## 2013-03-14 NOTE — Progress Notes (Signed)
Case discussed with Dr. Mechele Claude at the time of the visit.  We reviewed the resident's history and exam and pertinent patient test results.  I agree with the assessment, diagnosis, and plan of care documented in the resident's note.

## 2013-04-20 ENCOUNTER — Other Ambulatory Visit: Payer: Self-pay | Admitting: Internal Medicine

## 2013-06-13 ENCOUNTER — Ambulatory Visit (INDEPENDENT_AMBULATORY_CARE_PROVIDER_SITE_OTHER): Payer: Commercial Managed Care - HMO | Admitting: Internal Medicine

## 2013-06-13 ENCOUNTER — Encounter: Payer: Self-pay | Admitting: Internal Medicine

## 2013-06-13 VITALS — BP 123/73 | HR 54 | Temp 97.7°F | Ht 70.0 in | Wt 182.6 lb

## 2013-06-13 DIAGNOSIS — Z1211 Encounter for screening for malignant neoplasm of colon: Secondary | ICD-10-CM

## 2013-06-13 DIAGNOSIS — M25569 Pain in unspecified knee: Secondary | ICD-10-CM

## 2013-06-13 DIAGNOSIS — Z86718 Personal history of other venous thrombosis and embolism: Secondary | ICD-10-CM

## 2013-06-13 DIAGNOSIS — Z0000 Encounter for general adult medical examination without abnormal findings: Secondary | ICD-10-CM

## 2013-06-13 DIAGNOSIS — I82409 Acute embolism and thrombosis of unspecified deep veins of unspecified lower extremity: Secondary | ICD-10-CM

## 2013-06-13 DIAGNOSIS — K219 Gastro-esophageal reflux disease without esophagitis: Secondary | ICD-10-CM

## 2013-06-13 DIAGNOSIS — E785 Hyperlipidemia, unspecified: Secondary | ICD-10-CM

## 2013-06-13 DIAGNOSIS — M25562 Pain in left knee: Secondary | ICD-10-CM

## 2013-06-13 DIAGNOSIS — M25561 Pain in right knee: Secondary | ICD-10-CM

## 2013-06-13 NOTE — Assessment & Plan Note (Signed)
Per patient, he says he has had colonoscopy, but we cannot find the records any where. We found records on FOBT and EGD, but no colonoscopy. The patient has cancelled appointments for colonoscopy in the past, and I will have to make sure he would go through with it before I put in a screening referral.

## 2013-06-13 NOTE — Patient Instructions (Addendum)
[name],   Please keep taking your medications like you are! You are keeping good health. We talked about your high cholesterol and you will modify your diet to decrease fat in it. Please use the information material provided as needed to adjust your diet.  Lab Results  Component Value Date   CHOL 204* 03/13/2013   HDL 57 03/13/2013   LDLCALC 126* 03/13/2013   TRIG 106 03/13/2013   CHOLHDL 3.6 03/13/2013     We will meet in 3-4 months from now and reassess your cholesterol.   Thanks, Madilyn Fireman MD MPH 06/13/2013 9:23 AM

## 2013-06-13 NOTE — Progress Notes (Signed)
Subjective:     Patient ID: Jordan Long., male   DOB: 09-Jan-1951, 63 y.o.   MRN: [id number]  HPI [name] is a 63 year old man with history of DVT on Xarelto. He has recently undergone repair for recurrent bilateral hernias. He also has a history of GERD, however as stopped taking PPI and reports feeling fine without it. He also has a history of bilateral knee OA, however, he has had no pain in his knees for quite some time now. He denies chest pain, dizziness, palpitations, weakness or numbness or tinglings, cramps, bodyaches, back pain, nausea, vomiting, constipation, or diarrhea. He denies visual disturbances, headaches, or urinary issues.   Review of Systems Aas per HPI    Objective:   Physical Exam General: No acute distress.  HEENT: PERRL, EOMI.   CV: S1S2 RRR, no murmur Lungs: Bilateral Vesicular breath sounds.  Abdomen: Soft, non-tender, benign, with central scar and skin folds due to the scar Pedal Edema: none. Pedal pulses present.  Neuro: Alert and oriented times 3. No gross deficits seen.      Assessment& Plan:     Please see problem based charting.

## 2013-06-13 NOTE — Assessment & Plan Note (Signed)
Reports no reflux and has stopped taking prilosec per patient report.

## 2013-06-13 NOTE — Assessment & Plan Note (Signed)
On Xarelto, no adverse effects, tolerating medicine well.

## 2013-06-13 NOTE — Progress Notes (Signed)
Pt aware - hemoccult kit to be mailed to pt per Dr Ellwood Dense. Hilda Blades Desiree Fleming RN 06/13/13 4:45PM

## 2013-06-13 NOTE — Assessment & Plan Note (Signed)
Health Maintenance Due  Topic Date Due  . Colonoscopy  09/12/1998  . Zostavax  09/11/2008  . Colon Cancer Screening Annual Fobt  02/09/2013   Last colon cancer screening stool cards were all negative. We will send some this year as well.  We will discuss colonoscopy the next visit with the patient.  The patient has been given Zostavax prescription as I see it in our records, however he has no recollection of whether he took the shot. He says "Perhaps I took it." He does not remember the name of the pharmacy or date/month. It is not possible to get that record at this time.

## 2013-06-13 NOTE — Assessment & Plan Note (Signed)
Assessment: High LDL as per 03/2013 labs. Goal <100  Plan:  Discussed various statins. The patient does not want to start any new medications right now. He will do diet modification, and will follow up in 3-4 months. We will repeat levels at that time and reassess.   Information on diet modification given.

## 2013-06-13 NOTE — Assessment & Plan Note (Signed)
Feels left knee pain off and on, but currently no right pain. Reports taking no medicines for them.

## 2013-06-29 NOTE — Progress Notes (Signed)
06-29-2013  Received incomplete Hemoccult Cards by mail from Mr.Ambler.  Enclosed was a note stating he would be on vacation for the next two weeks.    Maryan Rued, PBT [facility]

## 2013-11-28 ENCOUNTER — Encounter: Payer: Self-pay | Admitting: Internal Medicine

## 2013-11-28 ENCOUNTER — Encounter: Payer: Commercial Managed Care - HMO | Admitting: Internal Medicine

## 2013-12-25 ENCOUNTER — Ambulatory Visit (INDEPENDENT_AMBULATORY_CARE_PROVIDER_SITE_OTHER): Payer: Commercial Managed Care - HMO | Admitting: *Deleted

## 2013-12-25 ENCOUNTER — Encounter: Payer: Self-pay | Admitting: Internal Medicine

## 2013-12-25 ENCOUNTER — Ambulatory Visit (INDEPENDENT_AMBULATORY_CARE_PROVIDER_SITE_OTHER): Payer: Commercial Managed Care - HMO | Admitting: Internal Medicine

## 2013-12-25 VITALS — BP 121/66 | HR 73 | Temp 97.3°F | Resp 20 | Ht 67.0 in | Wt 175.9 lb

## 2013-12-25 DIAGNOSIS — R413 Other amnesia: Secondary | ICD-10-CM

## 2013-12-25 DIAGNOSIS — F0280 Dementia in other diseases classified elsewhere without behavioral disturbance: Secondary | ICD-10-CM | POA: Insufficient documentation

## 2013-12-25 DIAGNOSIS — Z0000 Encounter for general adult medical examination without abnormal findings: Secondary | ICD-10-CM

## 2013-12-25 DIAGNOSIS — G309 Alzheimer's disease, unspecified: Secondary | ICD-10-CM

## 2013-12-25 DIAGNOSIS — Z23 Encounter for immunization: Secondary | ICD-10-CM

## 2013-12-25 DIAGNOSIS — Z1211 Encounter for screening for malignant neoplasm of colon: Secondary | ICD-10-CM

## 2013-12-25 NOTE — Addendum Note (Signed)
Addended by: Jessee Avers on: 12/25/2013 05:51 PM   Modules accepted: Level of Service

## 2013-12-25 NOTE — Assessment & Plan Note (Addendum)
Assessment: Most likely diagnosis dementia with questionable etiology, possibly likely Alzheimer's. His Mini-Mental status exam reveals a score of 21/30 with significant impairment of short-term memory. Neurologic physical examination is unremarkable. Ddx: Nutritional deficiencies cannot be excluded but his B12 and TSH were normal in 2013. NPH is unlikely based on a brain MRI in January 2013 revealed "Mild atrophy and minimal small vessel disease".  Will proceed with a standard workup for dementia. Formal screening for depression is negative using PHQ-9 questionnaire.   Plan: 1. Labs/imaging: TSH and vitamin B12, although this where normal in 2013. Will order brain MRI without contrast today. 2. Therapy: none, awaiting results. May consider referral to neurology for further evaluation. 3. Follow up: in one month with PCP with MRI results

## 2013-12-25 NOTE — Assessment & Plan Note (Signed)
Given a flu shot and pneumonia shot Give a FOBT cards. The ones done in May were incomplete

## 2013-12-25 NOTE — Progress Notes (Signed)
Internal Medicine Clinic Attending  Case discussed with Dr. Kazibwe soon after the resident saw the patient.  We reviewed the resident's history and exam and pertinent patient test results.  I agree with the assessment, diagnosis, and plan of care documented in the resident's note. 

## 2013-12-25 NOTE — Patient Instructions (Addendum)
General Instructions: We will put in orders some labs including thyroid and vitamin B-12 as these might be related to memory impairment Please cont with your medications as before When we get the results will determine if we need to send you to a brain specialist or order to obtain MRI Will make a referral to cardiology if needed Please follow up in 1 month with your regular doctor  Please bring your medicines with you each time you come to clinic.  Medicines may include prescription medications, over-the-counter medications, herbal remedies, eye drops, vitamins, or other pills.   Progress Toward Treatment Goals:  No flowsheet data found.  Self Care Goals & Plans:  No flowsheet data found.  No flowsheet data found.   Care Management & Community Referrals:  No flowsheet data found.

## 2013-12-25 NOTE — Progress Notes (Signed)
Patient ID: Jordan Long., male   DOB: Apr 16, 1950, 63 y.o.   MRN: [id number]   Subjective:   HPI: Jordan Long. is a 63 y.o. gentleman with past medical history of DVT, anxiety disorders, presents for evaluation of memory loss.  Reason(s) for this visit: 1. Memory loss: Patient presents unaccompanied with questions on a piece of paper written by his sister prior to the visit. Sister apparently is concerned about his declining memory. Patient reports that he recently has been frustrated by his poor memory. He is unable to tell me the duration of his symptoms. Patient is right-handed and is independent and is able to take care of his ADLs including paying bills. He lives by himself and has two adult children who live in [place] but outside [place]. He able to tell me their names. According to the list of questions that the hands to me, sister would like to know whether brain MRI might be needed evaluate for his memory issues. Otherwise, patient denies any symptoms of loss of consciousness, seizures, or any other neurological symptoms. He still drives and does not get lost. He does not have visual or auditory impairments. He thinks that his mother had problems with memory in her 67s, but he has no other significant family history of dementia. He does not take alcohol and he doesn't smoke. His last visit here was about 6 months ago with her PCP.   ROS: Constitutional: Denies fever, chills, diaphoresis, appetite change and fatigue.  Respiratory: Denies SOB, DOE, cough, chest tightness, and wheezing. Denies chest pain. CVS: No chest pain, palpitations and leg swelling.  GI: No abdominal pain, nausea, vomiting, bloody stools GU: No dysuria, frequency, hematuria, or flank pain.  MSK: No myalgias, back pain, joint swelling, arthralgias  Psych: No depression symptoms. No SI or SA.    Objective:  Physical Exam: Filed Vitals:   12/25/13 1433  BP: 121/66  Pulse: 73  Temp: 97.3 F  (36.3 C)  TempSrc: Oral  Resp: 20  Height: 5\' 7"  (1.702 m)  Weight: 175 lb 14.4 oz (79.788 kg)  SpO2: 99%   General: Well nourished. No acute distress.  HEENT: Normal oral mucosa. MMM.  Lungs: CTA bilaterally. Heart: RRR; no extra sounds or murmurs  Abdomen: Non-distended, normal bowel sounds, soft, nontender; no hepatosplenomegaly  Extremities: No pedal edema. No joint swelling or tenderness. Neurologic: Normal EOM,  Alert and oriented x3. No obvious neurologic/cranial nerve deficits.  MMSE 21/30. After 5 minutes, patient was unable to remember that he had been given pneumonia and flu shots in the clinic.  Assessment & Plan:  Discussed case with my attending in the clinic, Dr. Ellwood Dense. See problem based charting.

## 2013-12-26 LAB — VITAMIN B12: Vitamin B-12: 1059 pg/mL — ABNORMAL HIGH (ref 211–911)

## 2013-12-26 LAB — TSH: TSH: 3.736 u[IU]/mL (ref 0.350–4.500)

## 2013-12-27 ENCOUNTER — Telehealth: Payer: Self-pay | Admitting: *Deleted

## 2013-12-27 NOTE — Telephone Encounter (Signed)
Pt's sister would like for you to call her today at [phone] and explain lab results

## 2013-12-27 NOTE — Telephone Encounter (Signed)
Talked to the sister regarding plan to proceed with the MRI of the brain for his dementia. She verbalized understanding and she is waiting for the appointment. I told her that someone from here will call with appointment for the MRI.

## 2014-01-01 ENCOUNTER — Ambulatory Visit (HOSPITAL_COMMUNITY)
Admission: RE | Admit: 2014-01-01 | Discharge: 2014-01-01 | Disposition: A | Discharge status: Home / Self Care | Payer: Commercial Managed Care - HMO | Source: Ambulatory Visit | Attending: Internal Medicine | Admitting: Internal Medicine

## 2014-01-01 DIAGNOSIS — R413 Other amnesia: Secondary | ICD-10-CM | POA: Diagnosis not present

## 2014-01-01 DIAGNOSIS — G319 Degenerative disease of nervous system, unspecified: Secondary | ICD-10-CM | POA: Insufficient documentation

## 2014-01-02 ENCOUNTER — Other Ambulatory Visit: Payer: Self-pay | Admitting: Internal Medicine

## 2014-01-02 DIAGNOSIS — Z01810 Encounter for preprocedural cardiovascular examination: Secondary | ICD-10-CM

## 2014-01-02 DIAGNOSIS — I482 Chronic atrial fibrillation, unspecified: Secondary | ICD-10-CM

## 2014-01-05 ENCOUNTER — Telehealth: Payer: Self-pay | Admitting: *Deleted

## 2014-01-05 ENCOUNTER — Other Ambulatory Visit (INDEPENDENT_AMBULATORY_CARE_PROVIDER_SITE_OTHER): Payer: Commercial Managed Care - HMO

## 2014-01-05 DIAGNOSIS — Z1211 Encounter for screening for malignant neoplasm of colon: Secondary | ICD-10-CM

## 2014-01-05 LAB — POC HEMOCCULT BLD/STL (HOME/3-CARD/SCREEN)
Card #2 Fecal Occult Blod, POC: NEGATIVE
FECAL OCCULT BLD: NEGATIVE
Fecal Occult Blood, POC: NEGATIVE

## 2014-01-05 NOTE — Telephone Encounter (Signed)
Pt's sister calls and requests she be called w/ MRI results

## 2014-01-05 NOTE — Addendum Note (Signed)
Addended by: Truddie Crumble on: 01/05/2014 04:13 PM   Modules accepted: Orders

## 2014-01-08 NOTE — Telephone Encounter (Signed)
[phone] 

## 2014-01-08 NOTE — Telephone Encounter (Signed)
Could you please tell me what number she is reachable at?

## 2014-01-15 NOTE — Telephone Encounter (Signed)
Called the patient's sister and discussed MRI results.

## 2014-01-18 ENCOUNTER — Encounter: Payer: Self-pay | Admitting: Internal Medicine

## 2014-01-18 ENCOUNTER — Ambulatory Visit (INDEPENDENT_AMBULATORY_CARE_PROVIDER_SITE_OTHER): Payer: Commercial Managed Care - HMO | Admitting: Internal Medicine

## 2014-01-18 VITALS — BP 119/73 | HR 62 | Temp 97.8°F | Ht 67.0 in | Wt 175.3 lb

## 2014-01-18 DIAGNOSIS — R413 Other amnesia: Secondary | ICD-10-CM

## 2014-01-18 DIAGNOSIS — E559 Vitamin D deficiency, unspecified: Secondary | ICD-10-CM

## 2014-01-18 DIAGNOSIS — Z0000 Encounter for general adult medical examination without abnormal findings: Secondary | ICD-10-CM

## 2014-01-18 NOTE — Progress Notes (Signed)
   Subjective:    Patient ID: Jordan Long., male    DOB: 1951-02-02, 63 y.o.   MRN: [id number]  HPI Jordan Long is a 63 year old man who was worked up for memory impairment in the past visits, with an MRI brain done recently. The family has been very concerned with his memory issues. I had discussed the results of the MRI brain with his sister over the telephone last week. Today his sons accompany him to the visit to discuss the MRI, previous blood work and next plans. I discussed the results of the MRI with the patient, and his sons. Previous blood work which has been done TSH,  B12 levels was discussed with them. His depression scale has been negative.   The family admits to the slow onset of the memory issues, with gradual worsening consistent with Alzhemiers Disease. He depicts a definite impairment to process new information, and short term memory is a constant problem (forgets where things are, appointments). He has his remote memory intact. He is not having any psychiatric issues, nor does he have fluctuating levels of consciousness or tangential talking. He does not have behavior changes. He also displays difficulty in performing complex tasks which he could do before. Language impairment - sometimes word finding difficulty. The patient does not have any other neurological deficits. He does not have any symptoms of Parkinsons - no slowing of gait, mask facies, loss of fluency of speech. No history given for any seizures.   Otherwise no other complaints.   Review of Systems     Objective:   Physical Exam  Constitutional: He is oriented to person, place, and time. He appears well-developed and well-nourished. No distress.  HENT:  Head: Normocephalic and atraumatic.  Eyes: Conjunctivae and EOM are normal. Pupils are equal, round, and reactive to light. Right eye exhibits no discharge. Left eye exhibits no discharge.  Neck: Normal range of motion. Neck supple.  Cardiovascular:  Normal rate, regular rhythm, normal heart sounds and intact distal pulses.  Exam reveals no gallop and no friction rub.   No murmur heard. Pulmonary/Chest: Effort normal and breath sounds normal. No respiratory distress. He has no wheezes. He has no rales. He exhibits no tenderness.  Abdominal: Soft.  Musculoskeletal: Normal range of motion. He exhibits no edema or tenderness.  Neurological: He is alert and oriented to person, place, and time. He displays normal reflexes. No cranial nerve deficit. He exhibits normal muscle tone. Coordination normal.  Gait normal.  Skin: Skin is warm. He is not diaphoretic.  Psychiatric: He has a normal mood and affect.      Assessment & Plan:   Please see problem based charting.

## 2014-01-18 NOTE — Assessment & Plan Note (Signed)
The patient does not want to do colonoscopy at this time.

## 2014-01-18 NOTE — Patient Instructions (Signed)
Dear [name],   It was nice to meet with you today with your sons. We discussed your MRI results. I gave you a printed copy of the results.  We discussed other tests that have been done.  We will do a Vitamin D level today. I have given you information on Donepezil and we will consider starting it next visit.   Thanks, Madilyn Fireman MD MPH 01/18/2014 9:32 AM   Vitamin D Deficiency  Not having enough vitamin D is called a deficiency. Your body needs this vitamin to keep your bones strong and healthy. Having too little of it can make your bones soft or can cause other health problems.  HOME CARE  Take all vitamins, herbs, or nutrition drinks (supplements) as told by your doctor.  Have your blood tested 2 months after taking vitamins, herbs, or nutrition drinks.  Eat foods that have vitamin D. This includes:  Dairy products, cereals, or juices with added vitamin D. Check the label.  Fatty fish like salmon or trout.  Eggs.  Oysters.  Do not use tanning beds.  Stay at a healthy weight. Lose weight if needed.  Keep all doctor visits as told. GET HELP IF:  You have questions.  You continue to have problems.  You feel sick to your stomach (nauseous) or throw up (vomit).  You cannot go poop (constipated).  You feel confused.  You have severe belly (abdominal) or back pain. MAKE SURE YOU:  Understand these instructions.  Will watch your condition.  Will get help right away if you are not doing well or get worse. Document Released: 01/08/2011 Document Revised: 05/16/2012 Document Reviewed: 01/08/2011 [facility] Patient Information 2015 [place], [place]. This information is not intended to replace advice given to you by your health care provider. Make sure you discuss any questions you have with your health care provider.

## 2014-01-18 NOTE — Assessment & Plan Note (Signed)
Last vitamin D 25 Oh level was 8.  Will check repeat level today.

## 2014-01-18 NOTE — Assessment & Plan Note (Signed)
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 Discussed the disease and its prognosis with the patient and his two sons. I discussed Aricept, its efficacy and side-effects. I also gave the sons information published from [employer]od[employer] patient information section on the medicine. I discussed that there was no urgency to start the medication, and they can go over this decision within the family. I also gave them the option for a second opinion seeing their concern. We can do serial MMSE's 3-6 months to measure decline. Usually there is a decline of 3 points yearly. Instructed that the patient will not drive.

## 2014-01-19 LAB — VITAMIN D 25 HYDROXY (VIT D DEFICIENCY, FRACTURES): VIT D 25 HYDROXY: 31 ng/mL (ref 30–100)

## 2014-01-23 ENCOUNTER — Telehealth: Payer: Self-pay | Admitting: *Deleted

## 2014-01-23 DIAGNOSIS — R413 Other amnesia: Secondary | ICD-10-CM

## 2014-01-23 NOTE — Telephone Encounter (Signed)
Pt's sister called and was surprised to find out pt had alzheimer's. She would like to talk with you about other meds pt could take and a few other questions.  # [phone] or [phone]

## 2014-01-29 NOTE — Telephone Encounter (Signed)
Tried calling today, could not reach. Will call again tomorrow.

## 2014-01-30 NOTE — Telephone Encounter (Signed)
Called sister and discussed patient's problem with her. She and the patient's son would like to have a neurology referral for his memory problems and rule out any other issue before narrowing it to Alzheimer's. I concur. I will order a neurology referral.

## 2014-02-20 ENCOUNTER — Encounter: Payer: Self-pay | Admitting: *Deleted

## 2014-02-26 ENCOUNTER — Telehealth: Payer: Self-pay | Admitting: *Deleted

## 2014-02-26 NOTE — Telephone Encounter (Signed)
Pt's sister calls and states he does not want to see neuro, he would like to go ahead and start the medicine for memory or talk to you about it, you may call them at the ph#'s listed on chart

## 2014-02-28 NOTE — Telephone Encounter (Signed)
Pt's son has called and wants the doctor to call him, they have not heard anything, please call brian at [phone] asap

## 2014-03-02 NOTE — Telephone Encounter (Signed)
Called patient's sister who is very worried about the patient. I had a very long discussion with her. I asked her to elaborate the patient's symptoms again and she gave me more information than what his son's had provided during my last visit with him. Examples of the patient's memory problems are below  Problems with recent memory: Bringing his son lunch and forgetting in an hour and ask him again if he needs lunch.  Problems with executive function: Sometimes not being able to operate his phone.   Problems with wandering: Finding himself in the kitchen at night and not knowing how he came there.   Anxiety and Agitation sometimes, when he would be nervous, his hands shaking and needs to be calmed down.   Getting lost in familiar neighborhoods  Of note: the patient is still driving. He drives only during day and short distances. I insisted that the family should talk to him about driving and discourage it. If he resists, they should offer to copilot at times and see if he is missing on things, has risk factors or if he is nervous while driving. Sister agreed to do that. We discussed that he will need more observation in recent future.   I insisted that the patient be taken to neurology appointment which is this Friday for a second opinion.  The sister agreed. Next appt with clinic, we will discuss social support options and need to meet the social worker.

## 2014-03-09 ENCOUNTER — Ambulatory Visit (INDEPENDENT_AMBULATORY_CARE_PROVIDER_SITE_OTHER): Payer: Medicare HMO | Admitting: Diagnostic Neuroimaging

## 2014-03-09 ENCOUNTER — Encounter: Payer: Self-pay | Admitting: Diagnostic Neuroimaging

## 2014-03-09 VITALS — BP 124/77 | HR 65 | Ht 70.0 in | Wt 176.2 lb

## 2014-03-09 DIAGNOSIS — F0390 Unspecified dementia without behavioral disturbance: Secondary | ICD-10-CM

## 2014-03-09 DIAGNOSIS — F03A0 Unspecified dementia, mild, without behavioral disturbance, psychotic disturbance, mood disturbance, and anxiety: Secondary | ICD-10-CM

## 2014-03-09 MED ORDER — DONEPEZIL HCL 5 MG PO TABS: 5.0000 mg | ORAL_TABLET | Freq: Every day | ORAL | Status: DC

## 2014-03-09 NOTE — Patient Instructions (Addendum)
Start donepezil 5mg  daily. After 1 month increase to 10mg  daily.  Please talk with your family about setting up living will, power of attorney,.  Please discuss your living situation with family. I recommend that you do not live alone. I recommend that you transition away from driving.

## 2014-03-09 NOTE — Progress Notes (Signed)
[facility]  PATIENT: Jordan Long. DOB: Apr 24, 1950  REFERRING CLINICIAN: Bhardwaj HISTORY FROM: patient  REASON FOR VISIT: new consult    HISTORICAL  CHIEF COMPLAINT:  Chief Complaint  Patient presents with  . New Evaluation    memory issues     HISTORY OF PRESENT ILLNESS:   64 year old right-handed male here for evaluation of dementia/memory problems. According to patient he has had some mild memory problem for past few months, but he has difficulty explaining further. According to his sister who is here for this visit, patient has been having 3 year progressive short-term memory problems. He repeats himself. He has been paying his bills too soon. He got lost picking up his grandson from school. Misplacing objects. He still lives alone and still drives. There is family history of Parkinson's and schizophrenia and patient's mother.  I also spoke with patient's son Jordan Long on the phone who participated in discussion via speakerphone.    REVIEW OF SYSTEMS: Full 14 system review of systems performed and notable only for anxiety numbness memory loss confusion.  ALLERGIES: No Known Allergies  HOME MEDICATIONS: Outpatient Prescriptions Prior to Visit  Medication Sig Dispense Refill  . acetaminophen (TYLENOL) 500 MG tablet Take 500 mg by mouth every 6 (six) hours as needed for pain.     . Multiple Vitamin (MULITIVITAMIN WITH MINERALS) TABS Take 1 tablet by mouth daily.    . Calcium Carb-Cholecalciferol (CALCIUM + D3) 600-200 MG-UNIT TABS Take 1 tablet by mouth daily.    Marland Kitchen omeprazole (PRILOSEC) 40 MG capsule TAKE 1 CAPSULE (40 MG TOTAL) BY MOUTH DAILY. 30 capsule 1  . Rivaroxaban (XARELTO PO) Take 20 mg by mouth daily.     . tamsulosin (FLOMAX) 0.4 MG CAPS capsule Take 1 capsule (0.4 mg total) by mouth daily. 10 capsule 2  . zoster vaccine live, PF, (ZOSTAVAX) 64403 UNT/0.65ML injection Inject 19,400 Units into the skin once. 1 each 0   No  facility-administered medications prior to visit.    PAST MEDICAL HISTORY: Past Medical History  Diagnosis Date  . DVT (deep venous thrombosis) 2010 and 10/2010    after Hernia repair in 2010 s/p IVC filter and coumadin. Next episode after Hernia repair  ( 10/23/2010) . Admitted on 11/02/2010 for repeat DVT. Warfarin was stopped for  5 days before surgery.   . Incisional hernia 08/20/2010  . Constipation 11/20/2010  . GERD (gastroesophageal reflux disease)   . Pulmonary embolism 2010  . DVT (deep venous thrombosis) 10/2011    right leg after knee surgery    PAST SURGICAL HISTORY: Past Surgical History  Procedure Laterality Date  . Hernia repair  2010  . Hernia repair  10/23/2010    Incisional hernia w/mesh  . Hernia repair    . Septic abdominal surgery  2010  . Knee surgery Right 10/2011    Jordan Long  . Femoral artery stent Right 2010  . Tonsillectomy  as child  . Appendectomy  age 64  . Incisional hernia repair N/A 09/22/2012    Procedure: INICISIONAL HERNIA AND BILATERAL RECURRENT INGUINAL HERNIA  REPAIRED Jordan Long ;  Surgeon: Jordan Hector, MD;  Location: [facility];  Service: General;  Laterality: N/A;  . Insertion of mesh Bilateral 09/22/2012    Procedure: INSERTION OF MESH;  Surgeon: Jordan Hector, MD;  Location: [facility];  Service: General;  Laterality: Bilateral;  FOR REPAIR OF BILATERAL INGUINAL HERNIAS AND ALSO MESH INSERTED FOR VENTRAL HERNIA REPAIR   . Laparoscopic lysis of  adhesions N/A 09/22/2012    Procedure: LAPAROSCOPIC LYSIS OF ADHESIONS 90 minutes ;  Surgeon: Jordan Hector, MD;  Location: [facility];  Service: General;  Laterality: N/A;  . Inguinal hernia repair Bilateral 09/22/2012    Procedure: LAPAROSCOPIC BILATERAL INGUINAL HERNIA REPAIR;  Surgeon: Jordan Hector, MD;  Location: [facility];  Service: General;  Laterality: Bilateral;  RECURRENT INGUINAL HERNIAS BILATERAL     FAMILY HISTORY: Family History  Problem Relation Age of Onset  . Cancer Father     Lymphoma   .  Raynaud syndrome Father   . Diabetes Mother   . Parkinsonism Mother   . Thyroid disease Sister   . Diabetes Sister   . Raynaud syndrome Sister   . Lupus Sister   . Raynaud syndrome Sister   . Colon cancer Neg Hx   . Heart disease Mother   . Heart attack      neice  . Schizophrenia Mother     SOCIAL HISTORY:  History   Social History  . Marital Status: Single    Spouse Name: N/A    Number of Children: 2  . Years of Education: N/A   Occupational History  . retired [employer] History Main Topics  . Smoking status: Former Smoker -- 2 years    Types: Cigarettes    Quit date: 09/23/1985  . Smokeless tobacco: Never Used  . Alcohol Use: No  . Drug Use: No  . Sexual Activity: Not on file   Other Topics Concern  . Not on file   Social History Narrative   Lives alone   Retired   Doesn't not drink caffeine      PHYSICAL EXAM  Filed Vitals:   03/09/14 1112  BP: 124/77  Pulse: 65  Height: 5\' 10"  (1.778 m)  Weight: 176 lb 3.2 oz (79.924 kg)    Body mass index is 25.28 kg/(m^2).  No exam data present  MMSE - Mini Mental State Exam 03/09/2014  Orientation to time 1  Orientation to Place 5  Registration 3  Attention/ Calculation 2  Recall 0  Language- name 2 objects 2  Language- repeat 1  Language- follow 3 step command 3  Language- read & follow direction 1  Write a sentence 1  Copy design 1  Total score 20    GENERAL EXAM: Patient is in no distress; well developed, nourished and groomed; neck is supple  CARDIOVASCULAR: Regular rate and rhythm, no murmurs, no carotid bruits  NEUROLOGIC: MENTAL STATUS: awake, alert, oriented to person, place and time, recent and remote memory intact, normal attention and concentration, language fluent, comprehension intact, naming intact, fund of knowledge appropriate; MMSE 20/30; NO FRONTAL RELEASE SIGNS.  CRANIAL NERVE: no papilledema on fundoscopic exam, pupils equal and reactive to light, visual  fields full to confrontation, extraocular muscles intact, no nystagmus, facial sensation and strength symmetric, hearing intact, palate elevates symmetrically, uvula midline, shoulder shrug symmetric, tongue midline. MOTOR: normal bulk and tone, full strength in the BUE, BLE SENSORY: normal and symmetric to light touch, pinprick, temperature, vibration COORDINATION: finger-nose-finger, fine finger movements normal REFLEXES: deep tendon reflexes present and symmetric GAIT/STATION: narrow based gait; able to walk on toes, heels and tandem; romberg is negative    DIAGNOSTIC DATA (LABS, IMAGING, TESTING) - I reviewed patient records, labs, notes, testing and imaging myself where available.  Lab Results  Component Value Date   WBC 5.5 09/19/2012   HGB 13.6 09/19/2012   HCT 41.5 09/19/2012  MCV 90.8 09/19/2012   PLT 282 09/19/2012      Component Value Date/Time   NA 140 09/19/2012 1030   K 4.2 09/19/2012 1030   CL 105 09/19/2012 1030   CO2 28 09/19/2012 1030   GLUCOSE 88 09/19/2012 1030   BUN 16 09/19/2012 1030   CREATININE 1.17 09/19/2012 1030   CREATININE 1.37* 06/08/2012 1521   CALCIUM 9.7 09/19/2012 1030   PROT 7.2 02/01/2012 1014   ALBUMIN 4.6 02/01/2012 1014   AST 24 02/01/2012 1014   ALT 18 02/01/2012 1014   ALKPHOS 59 02/01/2012 1014   BILITOT 0.4 02/01/2012 1014   GFRNONAA 64* 09/19/2012 1030   GFRAA 74* 09/19/2012 1030   Lab Results  Component Value Date   CHOL 204* 03/13/2013   HDL 57 03/13/2013   LDLCALC 126* 03/13/2013   TRIG 106 03/13/2013   CHOLHDL 3.6 03/13/2013   Lab Results  Component Value Date   HGBA1C 5.4 03/02/2011   Lab Results  Component Value Date   WNIOEVOJ50 0938* 12/25/2013   Lab Results  Component Value Date   TSH 3.736 12/25/2013    I reviewed images myself and agree with interpretation. -[facility]  01/01/14 MRI BRAIN  1. Stable mild atrophy and white matter disease, likely reflecting the sequela of chronic microvascular ischemia. 2.  No acute intracranial abnormality or significant interval change.    ASSESSMENT AND PLAN  64 y.o. year old male here with progressive memory loss over past 3 years. Symptoms and neurologic examination consistent with probable mild mention dementia, Alzheimer's type. I reviewed diagnosis, prognosis, safety considerations, treatment options. I encouraged patient and family to meet with a lawyer to set up living will, power of attorney, long-term planning documents. Advised patient that he should probably transition to not living alone as well as to stop driving. Patient, sister and son agree with plan.  PLAN: - start donepezil 5mg  daily x 1 month, then may increase to 10mg  daily; in future could also add memantine 10mg  BID. - safety, long term planning discussions reviewed with patient. - may follow up with me in 6 months or sooner; may also follow up with PCP primarily and follow up here as needed.  Meds ordered this encounter  Medications  . donepezil (ARICEPT) 5 MG tablet    Sig: Take 1 tablet (5 mg total) by mouth at bedtime.    Dispense:  30 tablet    Refill:  6   Return in about 6 months (around 09/07/2014).    Jordan Bombard, MD 02/10/2991, 71:69 PM Certified in Neurology, Neurophysiology and Neuroimaging  [facility] [address], [place], [place] [address] [phone]

## 2014-04-10 ENCOUNTER — Encounter: Payer: Commercial Managed Care - HMO | Admitting: Internal Medicine

## 2014-06-12 ENCOUNTER — Other Ambulatory Visit: Payer: Self-pay | Admitting: Internal Medicine

## 2014-06-12 DIAGNOSIS — M25561 Pain in right knee: Secondary | ICD-10-CM

## 2014-06-12 DIAGNOSIS — M25562 Pain in left knee: Principal | ICD-10-CM

## 2014-06-21 DIAGNOSIS — M1711 Unilateral primary osteoarthritis, right knee: Secondary | ICD-10-CM | POA: Diagnosis not present

## 2014-06-25 ENCOUNTER — Ambulatory Visit (INDEPENDENT_AMBULATORY_CARE_PROVIDER_SITE_OTHER): Payer: Commercial Managed Care - HMO | Admitting: Internal Medicine

## 2014-06-25 ENCOUNTER — Encounter: Payer: Self-pay | Admitting: Internal Medicine

## 2014-06-25 VITALS — BP 125/70 | HR 70 | Temp 98.1°F | Ht 70.5 in | Wt 177.1 lb

## 2014-06-25 DIAGNOSIS — M25561 Pain in right knee: Secondary | ICD-10-CM | POA: Diagnosis not present

## 2014-06-25 DIAGNOSIS — M545 Low back pain, unspecified: Secondary | ICD-10-CM

## 2014-06-25 DIAGNOSIS — M25562 Pain in left knee: Secondary | ICD-10-CM | POA: Diagnosis not present

## 2014-06-25 DIAGNOSIS — M549 Dorsalgia, unspecified: Secondary | ICD-10-CM

## 2014-06-25 MED ORDER — TRAMADOL HCL 50 MG PO TABS: 50.0000 mg | ORAL_TABLET | Freq: Four times a day (QID) | ORAL | Status: DC | PRN

## 2014-06-25 MED ORDER — CYCLOBENZAPRINE HCL 5 MG PO TABS: 5.0000 mg | ORAL_TABLET | Freq: Three times a day (TID) | ORAL | Status: DC | PRN

## 2014-06-25 NOTE — Patient Instructions (Signed)
General Instructions:   Please try to bring all your medicines next time. This will help [place] keep you safe from mistakes.   For your knee pain we will start you on some pain medicine.    Progress Toward Treatment Goals:  No flowsheet data found.  Self Care Goals & Plans:  No flowsheet data found.  No flowsheet data found.   Care Management & Community Referrals:  No flowsheet data found.

## 2014-06-25 NOTE — Assessment & Plan Note (Signed)
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 The patient is currently wearing a brace. He is having little to no improvement now with corticosteroid injections. -Trial of tramadol 50 mg every 6 hours -Will need to arrange possible pain contract with PCP

## 2014-06-25 NOTE — Progress Notes (Signed)
Subjective:   Patient ID: Jordan Long. male   DOB: Nov 17, 1950 64 y.o.   MRN: [id number]  HPI: [name] is a 64 y.o. man pmh as listed below presents for right knee pain.   The patient recently had a corticosteroid injection in his right knee by Dr. Dorothe Pea of orthopedics. He normally has sustained relief but this time has no relief. He was also fitted for a new knee brace at that visit and has been compliant along with intermittently taking some Tylenol. He is accompanied by his sister he states that she had some leftover tramadol from an operation has been giving it to the patient who is had some symptomatic relief. He intermittently takes Tylenol which is only slightly effective. He is otherwise doing well and is having some right-sided back pain ever since starting the brace. He does not have any paresthesias, numbness, tingling, lower extremity weakness, bowel or bladder incontinence. He has not had any recent trauma to his back or knee.  As a side note the patient's sister states that there have been several sudden deaths in the family. With her husband a couple months ago, and the patient's daughter-in-law, along with another young family member. She says this is taking a toll on the patient as he is trying to be supportive to his son and 2 young grandchildren. He however still performs his ADLs and is high functioning and does not report any depressive feelings today.  Past Medical History  Diagnosis Date  . DVT (deep venous thrombosis) 2010 and 10/2010    after Hernia repair in 2010 s/p IVC filter and coumadin. Next episode after Hernia repair  ( 10/23/2010) . Admitted on 11/02/2010 for repeat DVT. Warfarin was stopped for  5 days before surgery.   . Incisional hernia 08/20/2010  . Constipation 11/20/2010  . GERD (gastroesophageal reflux disease)   . Pulmonary embolism 2010  . DVT (deep venous thrombosis) 10/2011    right leg after knee surgery   Current  Outpatient Prescriptions  Medication Sig Dispense Refill  . acetaminophen (TYLENOL) 500 MG tablet Take 500 mg by mouth every 6 (six) hours as needed for pain.     . cyclobenzaprine (FLEXERIL) 5 MG tablet Take 1 tablet (5 mg total) by mouth every 8 (eight) hours as needed for muscle spasms. 30 tablet 1  . donepezil (ARICEPT) 5 MG tablet Take 1 tablet (5 mg total) by mouth at bedtime. 30 tablet 6  . Multiple Vitamin (MULITIVITAMIN WITH MINERALS) TABS Take 1 tablet by mouth daily.    . traMADol (ULTRAM) 50 MG tablet Take 1 tablet (50 mg total) by mouth every 6 (six) hours as needed. 30 tablet 0  . XARELTO 20 MG TABS tablet Take 20 mg by mouth daily.  12   No current facility-administered medications for this visit.   Family History  Problem Relation Age of Onset  . Cancer Father     Lymphoma   . Raynaud syndrome Father   . Diabetes Mother   . Parkinsonism Mother   . Thyroid disease Sister   . Diabetes Sister   . Raynaud syndrome Sister   . Lupus Sister   . Raynaud syndrome Sister   . Colon cancer Neg Hx   . Heart disease Mother   . Heart attack      neice  . Schizophrenia Mother    History   Social History  . Marital Status: Single    Spouse Name: N/A  .  Number of Children: 2  . Years of Education: N/A   Occupational History  . retired [employer] History Main Topics  . Smoking status: Former Smoker -- 2 years    Types: Cigarettes    Quit date: 09/23/1985  . Smokeless tobacco: Never Used  . Alcohol Use: No  . Drug Use: No  . Sexual Activity: Not on file   Other Topics Concern  . None   Social History Narrative   Lives alone   Retired   Doesn't not drink caffeine    Review of Systems: Pertinent items are noted in HPI. Objective:  Physical Exam: Filed Vitals:   06/25/14 1339  BP: 125/70  Pulse: 70  Temp: 98.1 F (36.7 C)  TempSrc: Oral  Height: 5' 10.5" (1.791 m)  Weight: 177 lb 1.6 oz (80.332 kg)  SpO2: 97%   General: Sitting in chair,  NAD  Cardiac: RRR, no rubs, murmurs or gallops Pulm: clear to auscultation bilaterally, moving normal volumes of air Abd: soft, nontender, nondistended, BS present, multiple large mid abdominal well-healed incisions Ext: warm and well perfused, no pedal edema MSK: right knee in brace, FROM not limited by pain, right tight paraspinal muscle, No CVA tenderness  Assessment & Plan:  Please see problem oriented charting  Pt discussed with Dr. Lynnae January

## 2014-06-25 NOTE — Assessment & Plan Note (Signed)
Based on today's exam patient has some tense paraspinal muscle worse on the right than the left as the likely etiology. Patient has had a history of compression fractures. She does not have any red flags or symptoms to warrant further imaging. -Continue conservative management -Prescription for Flexeril 5 mg when necessary

## 2014-06-26 NOTE — Progress Notes (Signed)
Internal Medicine Clinic Attending  Case discussed with Dr. Sadek soon after the resident saw the patient.  We reviewed the resident's history and exam and pertinent patient test results.  I agree with the assessment, diagnosis, and plan of care documented in the resident's note. 

## 2014-08-01 ENCOUNTER — Other Ambulatory Visit: Payer: Self-pay | Admitting: *Deleted

## 2014-08-01 MED ORDER — TRAMADOL HCL 50 MG PO TABS: 50.0000 mg | ORAL_TABLET | Freq: Four times a day (QID) | ORAL | Status: DC | PRN

## 2014-08-01 NOTE — Telephone Encounter (Signed)
Tramadol rx called to [facility] on [address].

## 2014-08-18 ENCOUNTER — Other Ambulatory Visit: Payer: Self-pay | Admitting: Internal Medicine

## 2014-08-21 ENCOUNTER — Other Ambulatory Visit: Payer: Self-pay | Admitting: *Deleted

## 2014-08-21 MED ORDER — CYCLOBENZAPRINE HCL 5 MG PO TABS: 5.0000 mg | ORAL_TABLET | Freq: Three times a day (TID) | ORAL | Status: DC | PRN

## 2014-08-21 NOTE — Telephone Encounter (Signed)
Jordan Long - pt sister called about dosage on donepezil. Pt is currently on 5mg  and sister was under the impression it would be changed to 10mg . Talked with Dr Ellwood Dense - will need to see pt first. C. Cyndi Bender will call sister and sch an appt in clinic. The sister states will will call [facility] and cancel appt with Dr Leta Baptist for 09/10/14. Hilda Blades Chantalle Defilippo RN 08/21/14 10:25AM

## 2014-09-10 ENCOUNTER — Ambulatory Visit: Payer: Medicare HMO | Admitting: Diagnostic Neuroimaging

## 2014-09-16 ENCOUNTER — Other Ambulatory Visit: Payer: Self-pay | Admitting: Diagnostic Neuroimaging

## 2014-09-16 NOTE — Telephone Encounter (Signed)
Last OV cancelled noting: Patient (will see his pcp instead)

## 2014-09-19 ENCOUNTER — Other Ambulatory Visit: Payer: Self-pay | Admitting: Diagnostic Neuroimaging

## 2014-09-19 NOTE — Telephone Encounter (Signed)
Patient cancelled last appt, noting: Patient (will see his pcp instead)

## 2014-09-21 ENCOUNTER — Other Ambulatory Visit: Payer: Self-pay | Admitting: Internal Medicine

## 2014-09-21 MED ORDER — DONEPEZIL HCL 5 MG PO TABS: 5.0000 mg | ORAL_TABLET | Freq: Every day | ORAL | Status: DC

## 2014-09-21 NOTE — Telephone Encounter (Signed)
Pat number to call back is sister's number [name]). Patient needing his Aricept to be refilled @ [facility] [address].

## 2014-09-27 ENCOUNTER — Ambulatory Visit (INDEPENDENT_AMBULATORY_CARE_PROVIDER_SITE_OTHER): Payer: Commercial Managed Care - HMO | Admitting: Internal Medicine

## 2014-09-27 VITALS — BP 118/74 | HR 68 | Temp 98.1°F

## 2014-09-27 DIAGNOSIS — Z0000 Encounter for general adult medical examination without abnormal findings: Secondary | ICD-10-CM

## 2014-09-27 DIAGNOSIS — F0280 Dementia in other diseases classified elsewhere without behavioral disturbance: Secondary | ICD-10-CM | POA: Diagnosis not present

## 2014-09-27 DIAGNOSIS — G309 Alzheimer's disease, unspecified: Secondary | ICD-10-CM

## 2014-09-27 DIAGNOSIS — M25561 Pain in right knee: Secondary | ICD-10-CM

## 2014-09-27 DIAGNOSIS — M25562 Pain in left knee: Secondary | ICD-10-CM

## 2014-09-27 MED ORDER — CYCLOBENZAPRINE HCL 5 MG PO TABS: 5.0000 mg | ORAL_TABLET | Freq: Three times a day (TID) | ORAL | Status: DC | PRN

## 2014-09-27 MED ORDER — TRAMADOL HCL 50 MG PO TABS: 50.0000 mg | ORAL_TABLET | Freq: Four times a day (QID) | ORAL | Status: DC | PRN

## 2014-09-27 NOTE — Patient Instructions (Signed)
[name],   It is such a pleasure to see you and your sister today.  Your blood pressure is doing well BP Readings from Last 3 Encounters:  09/27/14 118/74  06/25/14 125/70  03/09/14 124/77   I will follow up on your referral to Orthopedics and get it scheduled.  We discussed today that it is wise to stop driving at this point in Al[place]heim[place]. You have assured me that you will think about it.  See you in 6 months.   Madilyn Fireman MD MPH 09/27/2014 11:54 AM [facility] [address] [place], [place] [address]. Ph: [phone] Hours: 8 am - 5 pm

## 2014-09-28 LAB — BASIC METABOLIC PANEL
BUN / CREAT RATIO: 18 (ref 10–22)
BUN: 21 mg/dL (ref 8–27)
CALCIUM: 9.8 mg/dL (ref 8.6–10.2)
CHLORIDE: 100 mmol/L (ref 97–108)
CO2: 23 mmol/L (ref 18–29)
CREATININE: 1.2 mg/dL (ref 0.76–1.27)
GFR calc Af Amer: 72 mL/min/{1.73_m2} (ref >59)
GFR calc non Af Amer: 63 mL/min/{1.73_m2} (ref >59)
GLUCOSE: 88 mg/dL (ref 65–99)
Potassium: 4.5 mmol/L (ref 3.5–5.2)
Sodium: 140 mmol/L (ref 134–144)

## 2014-09-30 NOTE — Progress Notes (Signed)
   Subjective:    Patient ID: Jordan Long., male    DOB: 07/02/1950, 64 y.o.   MRN: [id number]  HPI Mr Ihor Dow is a 64 year old man with mild Alzheimer's dementia. He comes in for a follow up with his sister. He has no complaints except knee pain for which he had earlier requested a referral for knee injections from his orthopedic doctor. I had provided it a few days ago and we will follow up on it. This visit, we discussed his progress with AD. He is on donepezil without any side effects. He still lives alone (family visits many times during the day) and still drives short distances. His sister explained that he is completely oriented and functioning fine on some days and completely confused and slow on others (like the movie "37 First Dates" she said). His sleep cycle is intact. He is helped by his family for his meals, supervised when he is doing something more than his ADLs, and lately has not been lost or been in major accidents.   Review of Systems  Respiratory: Negative for chest tightness and shortness of breath.   Cardiovascular: Negative for chest pain and leg swelling.  Musculoskeletal: Positive for arthralgias. Negative for back pain, joint swelling, gait problem and neck pain.      Objective:   Physical Exam  Constitutional: He is oriented to person, place, and time. He appears well-developed and well-nourished.  HENT:  Head: Normocephalic and atraumatic.  Eyes: Conjunctivae and EOM are normal. Pupils are equal, round, and reactive to light.  Cardiovascular: Normal rate, regular rhythm and normal heart sounds.   No murmur heard. Pulmonary/Chest: Effort normal and breath sounds normal.  Abdominal: Soft.  Musculoskeletal: Normal range of motion.  No edema or tenderness surrounding knees  Neurological: He is alert and oriented to person, place, and time.      Assessment & Plan:   Please see problem based charting.

## 2014-09-30 NOTE — Assessment & Plan Note (Signed)
Mild disease, on donepezil with good tolerance.   Discussed stopping driving with great emphasis - reminding the patient and sister that its not just about getting lost but slowing of reflexes that can cause accidents in patient with AD. The patient and his sister assured that they would strongly consider it.  Discussed preparing as a family for future care and supervision for the patient.  Discussed requirement of social support and contact with our clinic social worker for further resources if the family needs. They declined at present.

## 2014-09-30 NOTE — Assessment & Plan Note (Addendum)
Follow up on the orthopedic referral for injections already provided to the patient.

## 2014-10-09 ENCOUNTER — Other Ambulatory Visit: Payer: Self-pay | Admitting: Internal Medicine

## 2014-10-10 NOTE — Telephone Encounter (Signed)
Patient with increased knee pain awaiting to see orthopedic specialist Dr Noemi Chapel for knee injections. Discussed expediting his referral and intimating him about his appointment date. Will give 30 pills of Tramadol one more time.

## 2014-10-10 NOTE — Telephone Encounter (Signed)
Called to pharm 

## 2014-10-17 ENCOUNTER — Telehealth: Payer: Self-pay | Admitting: Internal Medicine

## 2014-10-17 NOTE — Telephone Encounter (Signed)
Call from pt's sister "[name]" [phone]).  States pt's behavior is somewhat changed since last visit here on 09/27/2014.  Pt due for aricept 5mg  refill tomorrow, but family would like know if you could increase dose. States pt is getting a little more confused, easily upset, and afraid on things.   Will pass info to pcp for review, please advise.Despina Hidden Cassady9/14/20163:54 PM

## 2014-10-17 NOTE — Telephone Encounter (Signed)
Pt sister called regarding donepezil Rx. Please call pt sister back.

## 2014-10-18 ENCOUNTER — Telehealth: Payer: Self-pay | Admitting: Licensed Clinical Social Worker

## 2014-10-18 NOTE — Telephone Encounter (Signed)
CSW received message from pt's family member, Jester Klingberg.  Family requesting return call to discuss assisting living facilities.  CSW placed called to Mr. Mcculloh.  CSW left message requesting return call. CSW provided contact hours and phone number.

## 2014-10-19 NOTE — Telephone Encounter (Signed)
Ok to increase Donepezil to 10 mg daily.  Patient should STOP DRIVING. I have discussed this in detail with his sister last visit.  Please call the patient and inform. I could not reach today when I tried.

## 2014-10-22 NOTE — Telephone Encounter (Signed)
Called and spoke to sister, she repeated instructions, is a bit worried about stopping the driving but states she knows she must do this, she states she will call in a few days and report progress

## 2014-10-22 NOTE — Telephone Encounter (Signed)
CSW placed called to family.  CSW left message requesting return call. CSW provided contact hours and phone number.

## 2014-12-19 ENCOUNTER — Telehealth: Payer: Self-pay | Admitting: Student in an Organized Health Care Education/Training Program

## 2014-12-19 NOTE — Telephone Encounter (Signed)
Left message at [phone] for Ledora Bottcher - message left pt is having nervous problems. Left message pt would need to be seen in clinic - there were 2 open appts 12/20/14 and 2 appt on 12/21/14. To call first thing in AM to sch an appt. There are no current med  as needed  for anxiety on  Med list. Hilda Blades Yousef Huge RN 12/19/14 4:35PM

## 2014-12-19 NOTE — Telephone Encounter (Signed)
Patient having nervous problems and sister wanting some advice on what medications he could take.

## 2014-12-20 NOTE — Telephone Encounter (Signed)
Talked with sister Jordan Long [phone] - appt made 12/21/14 10:15AM Dr Merrilyn Puma. Offered an appt for 12/20/14 - preferred to come 12/21/14.

## 2014-12-21 ENCOUNTER — Ambulatory Visit: Payer: Commercial Managed Care - HMO | Admitting: Internal Medicine

## 2014-12-25 ENCOUNTER — Ambulatory Visit: Payer: Commercial Managed Care - HMO | Admitting: Internal Medicine

## 2015-01-17 ENCOUNTER — Other Ambulatory Visit: Payer: Self-pay | Admitting: Internal Medicine

## 2015-01-17 DIAGNOSIS — I480 Paroxysmal atrial fibrillation: Secondary | ICD-10-CM

## 2015-01-18 DIAGNOSIS — Z7901 Long term (current) use of anticoagulants: Secondary | ICD-10-CM | POA: Diagnosis not present

## 2015-01-18 DIAGNOSIS — Z86711 Personal history of pulmonary embolism: Secondary | ICD-10-CM | POA: Diagnosis not present

## 2015-01-18 DIAGNOSIS — I480 Paroxysmal atrial fibrillation: Secondary | ICD-10-CM | POA: Diagnosis not present

## 2015-01-18 DIAGNOSIS — Z86718 Personal history of other venous thrombosis and embolism: Secondary | ICD-10-CM | POA: Diagnosis not present

## 2015-03-07 ENCOUNTER — Encounter: Payer: Self-pay | Admitting: Internal Medicine

## 2015-03-07 ENCOUNTER — Ambulatory Visit (INDEPENDENT_AMBULATORY_CARE_PROVIDER_SITE_OTHER): Payer: Commercial Managed Care - HMO | Admitting: Internal Medicine

## 2015-03-07 VITALS — BP 112/72 | HR 73 | Temp 98.3°F | Ht 70.0 in | Wt 181.7 lb

## 2015-03-07 DIAGNOSIS — R42 Dizziness and giddiness: Secondary | ICD-10-CM | POA: Diagnosis not present

## 2015-03-07 NOTE — Progress Notes (Signed)
Patient ID: Jordan Long., male   DOB: 17-Oct-1950, 65 y.o.   MRN: [id number]   Subjective:   Patient ID: Jordan Long. male   DOB: [birth year] 65 y.o.   MRN: [id number]  HPI: [name] is a 65 y.o.  Presented today today with dizziness today. First time yesterday, no previous occurrence per pt and sister. Describes lightheadedness, no ear symptoms- ringing or fullness. Dizziness present after he mowed the lawn. Resolved after he at for a while . No real nausea or vomiting while dizzy, no chest pain or palpitations, no falls, no difficulty breathing. Pt is on xarelto and compliant. Pt denies any hx to suggest fluid loss, has good appetite, no bloody stools, says his stools are "mostly" normal colour- could not expanciate on that when pressed.  Sister thinks patient has urinary frequency, but patient says he hardly wakes up at night to pass urine, then again sister says he most likely does but does not remember it.  Past Medical History  Diagnosis Date  . DVT (deep venous thrombosis) ([place]) 2010 and 10/2010    after Hernia repair in 2010 s/p IVC filter and coumadin. Next episode after Hernia repair  ( 10/23/2010) . Admitted on 11/02/2010 for repeat DVT. Warfarin was stopped for  5 days before surgery.   . Incisional hernia 08/20/2010  . Constipation 11/20/2010  . GERD (gastroesophageal reflux disease)   . Pulmonary embolism ([place]) 2010  . DVT (deep venous thrombosis) ([place]) 10/2011    right leg after knee surgery   Current Outpatient Prescriptions  Medication Sig Dispense Refill  . acetaminophen (TYLENOL) 500 MG tablet Take 500 mg by mouth every 6 (six) hours as needed for pain.     . cyclobenzaprine (FLEXERIL) 5 MG tablet Take 1 tablet (5 mg total) by mouth every 8 (eight) hours as needed for muscle spasms. 30 tablet 1  . donepezil (ARICEPT) 5 MG tablet Take 1 tablet (5 mg total) by mouth at bedtime. 30 tablet 6  . Multiple Vitamin (MULITIVITAMIN WITH  MINERALS) TABS Take 1 tablet by mouth daily.    . traMADol (ULTRAM) 50 MG tablet TAKE 1 TABLET BY MOUTH EVERY 6 HOURS AS NEEDED 30 tablet 0  . XARELTO 20 MG TABS tablet Take 20 mg by mouth daily.  12   No current facility-administered medications for this visit.   Family History  Problem Relation Age of Onset  . Cancer Father     Lymphoma   . Raynaud syndrome Father   . Diabetes Mother   . Parkinsonism Mother   . Thyroid disease Sister   . Diabetes Sister   . Raynaud syndrome Sister   . Lupus Sister   . Raynaud syndrome Sister   . Colon cancer Neg Hx   . Heart disease Mother   . Heart attack      neice  . Schizophrenia Mother    Social History   Social History  . Marital Status: Single    Spouse Name: N/A  . Number of Children: 2  . Years of Education: N/A   Occupational History  . retired [employer] History Main Topics  . Smoking status: Former Smoker -- 2 years    Types: Cigarettes    Quit date: 09/23/1985  . Smokeless tobacco: Never Used  . Alcohol Use: No  . Drug Use: No  . Sexual Activity: Not Asked   Other Topics Concern  . None   Social History Narrative  Lives alone   Retired   Doesn't not drink caffeine    Review of Systems: CONSTITUTIONAL- No Fever, weightloss, night sweat or change in appetite. SKIN- No Rash, colour changes or itching. HEAD- No Headache or dizziness, has memory loss that appear mild. Mouth/throat- No Sorethroat, or bleeding gums. RESPIRATORY- No Cough or SOB. CARDIAC- No Palpitations, or chest pain. GI- No vomiting, diarrhoea, abd pain. URINARY- No Frequency, urgency, straining or dysuria. NEUROLOGIC- No Numbness, or burning. [facility]- Denies depression or anxiety.  Objective:  Physical Exam: Filed Vitals:   03/07/15 1342  BP: 112/72  Pulse: 73  Temp: 98.3 F (36.8 C)  TempSrc: Oral  Height: 5\' 10"  (1.778 m)  Weight: 181 lb 11.2 oz (82.419 kg)  SpO2: 73%   GENERAL- alert, co-operative, appears as  stated age, not in any distress. HEENT- Atraumatic, normocephalic, PERRL, EOMI, oral mucosa appears moist, neck supple. CARDIAC- RRR, no murmurs, rubs or gallops. RESP- Moving equal volumes of air, and clear to auscultation bilaterally, no wheezes or crackles. ABDOMEN- Soft, nontender, no guarding or rebound, no palpable masses or organomegaly, bowel sounds present. BACK- Normal curvature of the spine, No tenderness along the vertebrae, no CVA tenderness. NEURO- No obvious Cr N abnormality, strenght upper and lower extremities- 5/5, Gait- Normal, a little stooped over. EXTREMITIES- pulse 2+, symmetric, no pedal edema. SKIN- Warm, dry, No rash or lesion. PSYCH- Normal mood and affect, appropriate thought content and speech.  Assessment & Plan:   The patient's case and plan of care was discussed with attending physician, Dr. Dareen Piano.  Please see problem based charting for assessment and plan.

## 2015-03-07 NOTE — Assessment & Plan Note (Addendum)
Patient today with one complaint of dizziness, apparently has had this before which was worked up with imaging- Echo and head imaging.  Differentials- dehydration- but no hx of fluid loss, exhaustion, PE- on anticoag, Cardiac- no chest pain, no murmurs, last ECHO 2013 normal valves  -no Aortic stenois, and normal EF, autonomic dysfunction, orthostatic vitals were normal.  Episode Most likely due to exhaustion.  Plan- Bmet - CBC- rule out GI bleed. - Consider Physical therapy if persistent and/or recurrent episodes. - UA evaluate ?frequency

## 2015-03-07 NOTE — Patient Instructions (Signed)
We will be getting some blood work today. If there are any abnormalities, we will call you with the results. Otherwise , we will refer you to physical therapy to help prevent falls.

## 2015-03-08 LAB — URINALYSIS, ROUTINE W REFLEX MICROSCOPIC
BILIRUBIN UA: NEGATIVE
Glucose, UA: NEGATIVE
Ketones, UA: NEGATIVE
LEUKOCYTES UA: NEGATIVE
Nitrite, UA: NEGATIVE
PH UA: 7 (ref 5.0–7.5)
RBC UA: NEGATIVE
SPEC GRAV UA: 1.025 (ref 1.005–1.030)
Urobilinogen, Ur: 0.2 mg/dL (ref 0.2–1.0)

## 2015-03-08 LAB — BMP8+ANION GAP
Anion Gap: 14 mmol/L (ref 10.0–18.0)
BUN / CREAT RATIO: 13 (ref 10–22)
BUN: 16 mg/dL (ref 8–27)
CHLORIDE: 103 mmol/L (ref 96–106)
CO2: 24 mmol/L (ref 18–29)
Calcium: 9.3 mg/dL (ref 8.6–10.2)
Creatinine, Ser: 1.27 mg/dL (ref 0.76–1.27)
GFR calc non Af Amer: 58 mL/min/{1.73_m2} — ABNORMAL LOW (ref >59)
GFR, EST AFRICAN AMERICAN: 68 mL/min/{1.73_m2} (ref >59)
GLUCOSE: 91 mg/dL (ref 65–99)
POTASSIUM: 5.2 mmol/L (ref 3.5–5.2)
Sodium: 141 mmol/L (ref 134–144)

## 2015-03-08 LAB — MICROSCOPIC EXAMINATION
Bacteria, UA: NONE SEEN
CASTS: NONE SEEN /LPF

## 2015-03-08 LAB — CBC
HEMATOCRIT: 41.5 % (ref 37.5–51.0)
Hemoglobin: 13.5 g/dL (ref 12.6–17.7)
MCH: 29.4 pg (ref 26.6–33.0)
MCHC: 32.5 g/dL (ref 31.5–35.7)
MCV: 90 fL (ref 79–97)
Platelets: 322 10*3/uL (ref 150–379)
RBC: 4.59 x10E6/uL (ref 4.14–5.80)
RDW: 14 % (ref 12.3–15.4)
WBC: 8.3 10*3/uL (ref 3.4–10.8)

## 2015-03-08 NOTE — Progress Notes (Signed)
Internal Medicine Clinic Attending  Case discussed with Dr. Emokpae soon after the resident saw the patient.  We reviewed the resident's history and exam and pertinent patient test results.  I agree with the assessment, diagnosis, and plan of care documented in the resident's note. 

## 2015-03-11 ENCOUNTER — Encounter: Payer: Commercial Managed Care - HMO | Admitting: Student in an Organized Health Care Education/Training Program

## 2015-03-30 ENCOUNTER — Other Ambulatory Visit: Payer: Self-pay | Admitting: Oncology

## 2015-04-04 ENCOUNTER — Ambulatory Visit: Payer: Commercial Managed Care - HMO | Admitting: Internal Medicine

## 2015-06-04 ENCOUNTER — Encounter: Payer: Self-pay | Admitting: Internal Medicine

## 2015-06-04 ENCOUNTER — Ambulatory Visit (INDEPENDENT_AMBULATORY_CARE_PROVIDER_SITE_OTHER): Payer: Commercial Managed Care - HMO | Admitting: Internal Medicine

## 2015-06-04 VITALS — BP 123/78 | HR 83 | Temp 98.2°F | Wt 188.0 lb

## 2015-06-04 DIAGNOSIS — F419 Anxiety disorder, unspecified: Secondary | ICD-10-CM | POA: Insufficient documentation

## 2015-06-04 DIAGNOSIS — F064 Anxiety disorder due to known physiological condition: Secondary | ICD-10-CM | POA: Diagnosis not present

## 2015-06-04 DIAGNOSIS — F418 Other specified anxiety disorders: Secondary | ICD-10-CM

## 2015-06-04 DIAGNOSIS — Z1211 Encounter for screening for malignant neoplasm of colon: Secondary | ICD-10-CM

## 2015-06-04 DIAGNOSIS — K219 Gastro-esophageal reflux disease without esophagitis: Secondary | ICD-10-CM

## 2015-06-04 DIAGNOSIS — F0280 Dementia in other diseases classified elsewhere without behavioral disturbance: Secondary | ICD-10-CM

## 2015-06-04 DIAGNOSIS — B351 Tinea unguium: Secondary | ICD-10-CM

## 2015-06-04 DIAGNOSIS — G309 Alzheimer's disease, unspecified: Secondary | ICD-10-CM

## 2015-06-04 DIAGNOSIS — I872 Venous insufficiency (chronic) (peripheral): Secondary | ICD-10-CM

## 2015-06-04 DIAGNOSIS — M7989 Other specified soft tissue disorders: Secondary | ICD-10-CM

## 2015-06-04 MED ORDER — DULOXETINE HCL 30 MG PO CPEP: 30.0000 mg | ORAL_CAPSULE | Freq: Every day | ORAL | Status: DC

## 2015-06-04 MED ORDER — OMEPRAZOLE 20 MG PO CPDR: 20.0000 mg | DELAYED_RELEASE_CAPSULE | Freq: Every day | ORAL | Status: DC

## 2015-06-04 NOTE — Assessment & Plan Note (Signed)
Overview He is due for colon cancer screening though is not interested in annual colonoscopy.  Assessment Eligible for colon cancer screening  Plan Provided him with FOBT cards to mail in

## 2015-06-04 NOTE — Assessment & Plan Note (Signed)
Overview Physical exam findings notable for thickened, yellowed nails bilaterally. He reports they have been like this for quite some time and in fact ripped off the nail from his right toe.  Assessment Onychomycosis of multiple digits. I do not think topical therapy will be helpful in this case given the diffuse involvement.  Plan Referral to podiatry for further management

## 2015-06-04 NOTE — Assessment & Plan Note (Addendum)
Overview His sister reports today that he has had increased anxiety since his last visit here. Nearly every day for the past week, he does acknowledge feelings of anxiety, and his sister feels it is difficult for him to stop worrying about things. His primary worry is about how his cognitive decline will progress and whether or not he'll be cared for as he continues to deteriorate. He worries about having the financial means to provide for himself. His sister also feels that he does worry about things to the point where it does make him irritable/angry though he cannot recall these episodes. Additionally, he has had decreased concentration and some depressed mood though no difficulties with sleep.  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 He also has some features of depression for which SNRI therapy may be helpful. Per initial review the literature, I do not see any drug interactions with aripiprazole.  Plan -Prescribe Cymbalta 30 mg with plan to follow-up in 2 weeks to assess his response to therapy -Counseled him and his sister against adverse effects of worsening mood at which point they should discontinue therapy and call the clinic to which they acknowledged understanding

## 2015-06-04 NOTE — Patient Instructions (Addendum)
For heartburn, please start taking omeprazole 20 mg daily.  For anxiety, please start taking Cymbalta 30 mg daily. If you notice that your mood worsens or that you experience something different that does not feel pleasant, please give the office a call back.  For colon cancer screening, please send in the stool cards.  For the leg swelling, we will work on sending you some compression stockings. If you notice trouble breathing or chest pain, please give the office a call immediately.  For the toenails, we will work on getting you a referral to podiatrist.  I look forward to seeing you back in 2 weeks.

## 2015-06-04 NOTE — Assessment & Plan Note (Signed)
Overview He complains today of persistent heartburn symptoms. He has had these symptoms in the past and they improved on omeprazole therapy. He has stopped eating bananas and red sauce as their precipitants of these symptoms, but he has had no relief. He cannot report a specific time or the frequency of these symptoms. He has also tried ranitidine at the suggestion of his sister without much relief either. He denies any dysphagia, hoarseness, diarrhea, constipation.  Assessment Recurrent episode of GERD  Plan Prescribed omeprazole 20 mg daily to be taken 30-60 minutes before meal

## 2015-06-04 NOTE — Progress Notes (Signed)
   Subjective:    Patient ID: Jordan Long., male    DOB: 05-16-1950, 65 y.o.   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 Please see assessment & plan for status of chronic medical problems.    Review of Systems  Constitutional: Negative for unexpected weight change.  HENT: Negative for voice change.   Respiratory: Negative for shortness of breath.   Cardiovascular: Positive for leg swelling. Negative for chest pain.  Gastrointestinal: Negative for abdominal pain, diarrhea and constipation.  Psychiatric/Behavioral: Positive for decreased concentration. Negative for suicidal ideas, sleep disturbance, self-injury and dysphoric mood. The patient is nervous/anxious.        Objective:   Physical Exam  Constitutional: He is oriented to person, place, and time. He appears well-developed and well-nourished. No distress.  HENT:  Head: Normocephalic and atraumatic.  Eyes: Conjunctivae are normal. No scleral icterus.  Neck: No tracheal deviation present.  Cardiovascular: Normal rate, regular rhythm and normal heart sounds.   No murmur heard. Pulmonary/Chest: Effort normal and breath sounds normal. No stridor.  Musculoskeletal: Normal range of motion. He exhibits edema (2+ pitting edema noted around the ankles bilaterally. No asymmetry noted of the swelling.).  No calf tenderness noted bilaterally.  Neurological: He is alert and oriented to person, place, and time.  Skin: Skin is warm and dry.  Thickened, yellowed nails noted on bilateral feet. Right toe with partially torn nail.          Assessment & Plan:

## 2015-06-04 NOTE — Assessment & Plan Note (Signed)
Overview His sister expresses concern about his enlarged right ankle as compared to the left ankle. She also notes he has developed some swelling around the ankle. He denies any chest pain or difficulty breathing. In fact, he feels that the swelling is not bothersome to him at all.  Assessment Suspect chronic venous insufficiency may be at play here. Lack of calf tenderness and asymmetrical findings would be inconsistent with DVT. Certainly, he does every's factors for thrombosis given IVC filter which does not appear to have been retrieved, but he does report adherence to Xarelto 20 mg daily.  Plan -Collected measurements today for compression stockings which we will mail to him -Advised him and his sister that should he have chest pain or difficulty breathing, he should call the clinic immediately for urgent evaluation

## 2015-06-05 NOTE — Progress Notes (Signed)
Internal Medicine Clinic Attending  Case discussed with Dr. Patel,Rushil at the time of the visit.  We reviewed the resident's history and exam and pertinent patient test results.  I agree with the assessment, diagnosis, and plan of care documented in the resident's note.  

## 2015-06-19 ENCOUNTER — Encounter: Payer: Self-pay | Admitting: Internal Medicine

## 2015-06-19 ENCOUNTER — Ambulatory Visit (INDEPENDENT_AMBULATORY_CARE_PROVIDER_SITE_OTHER): Payer: Commercial Managed Care - HMO | Admitting: Internal Medicine

## 2015-06-19 VITALS — BP 116/70 | HR 72 | Temp 98.6°F | Ht 70.0 in | Wt 184.9 lb

## 2015-06-19 DIAGNOSIS — R252 Cramp and spasm: Secondary | ICD-10-CM | POA: Diagnosis not present

## 2015-06-19 DIAGNOSIS — F418 Other specified anxiety disorders: Secondary | ICD-10-CM

## 2015-06-19 DIAGNOSIS — E559 Vitamin D deficiency, unspecified: Secondary | ICD-10-CM | POA: Diagnosis not present

## 2015-06-19 DIAGNOSIS — R748 Abnormal levels of other serum enzymes: Secondary | ICD-10-CM | POA: Diagnosis not present

## 2015-06-19 DIAGNOSIS — R7989 Other specified abnormal findings of blood chemistry: Secondary | ICD-10-CM

## 2015-06-19 DIAGNOSIS — G309 Alzheimer's disease, unspecified: Secondary | ICD-10-CM | POA: Diagnosis not present

## 2015-06-19 DIAGNOSIS — F0280 Dementia in other diseases classified elsewhere without behavioral disturbance: Secondary | ICD-10-CM

## 2015-06-19 DIAGNOSIS — F064 Anxiety disorder due to known physiological condition: Secondary | ICD-10-CM

## 2015-06-19 NOTE — Progress Notes (Signed)
   Subjective:    Patient ID: [name]., male    DOB: 08/21/1950, 65 y.o.   MRN: [id number]  HPI Mr. 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 Please see assessment & plan for status of chronic medical problems.   Review of Systems  Constitutional: Negative for activity change and appetite change.  Gastrointestinal: Negative for abdominal pain.  Musculoskeletal: Negative for joint swelling.  Psychiatric/Behavioral: Negative for suicidal ideas, sleep disturbance, self-injury and dysphoric mood. The patient is nervous/anxious.        Objective:   Physical Exam  Constitutional: He is oriented to person, place, and time. He appears well-developed and well-nourished. No distress.  HENT:  Head: Normocephalic and atraumatic.  Eyes: Conjunctivae are normal. No scleral icterus.  Neck: No tracheal deviation present.  Cardiovascular: Normal rate, regular rhythm and normal heart sounds.   No murmur heard. Pulmonary/Chest: Effort normal and breath sounds normal. No stridor.  Musculoskeletal:  Strength testing of both hands is unremarkable for any focal weakness in grip strength, ability to oppose any other 4 digits with thumb, wrist extension/flexion. No joint deformities noted of hands bilaterally. No tenderness noted of the anatomic snuffbox either.  Neurological: He is alert and oriented to person, place, and time.  Skin: Skin is warm and dry.          Assessment & Plan:

## 2015-06-19 NOTE — Patient Instructions (Addendum)
Once we have the results of your bloodwork, I will prescribe a Vitamin D supplement.   Please see if you can schedule an appointment with Dr. Evette Doffing in 1 month.

## 2015-06-20 ENCOUNTER — Telehealth: Payer: Self-pay | Admitting: Internal Medicine

## 2015-06-20 ENCOUNTER — Telehealth: Payer: Self-pay | Admitting: *Deleted

## 2015-06-20 LAB — BMP8+ANION GAP
Anion Gap: 15 mmol/L (ref 10.0–18.0)
BUN / CREAT RATIO: 18 (ref 10–24)
BUN: 23 mg/dL (ref 8–27)
CALCIUM: 9.3 mg/dL (ref 8.6–10.2)
CHLORIDE: 104 mmol/L (ref 96–106)
CO2: 23 mmol/L (ref 18–29)
CREATININE: 1.3 mg/dL — AB (ref 0.76–1.27)
GFR calc non Af Amer: 57 mL/min/{1.73_m2} — ABNORMAL LOW (ref >59)
GFR, EST AFRICAN AMERICAN: 66 mL/min/{1.73_m2} (ref >59)
GLUCOSE: 96 mg/dL (ref 65–99)
Potassium: 4.3 mmol/L (ref 3.5–5.2)
Sodium: 142 mmol/L (ref 134–144)

## 2015-06-20 LAB — VITAMIN D 25 HYDROXY (VIT D DEFICIENCY, FRACTURES): Vit D, 25-Hydroxy: 29.4 ng/mL — ABNORMAL LOW (ref 30.0–100.0)

## 2015-06-20 NOTE — Telephone Encounter (Signed)
  Reason for call:   I placed an outgoing call to the sister of [name]. at 405  PM regarding labwork.   Assessment/ Plan:   I left a voicemail asking the sister of the patient to call back to review the following.  Vit D was 29.4 which is borderline mild deficiency. He should be on some daily supplementation of 1000U. Unclear if he is taking a multivitamin already with this amount.  BMET with reassuring K 4.3 though GFR appears stable at 50s now on two BMETs which may appear consistent with chronic kidney disease Stage 3.   For additional workup, I would recommend he return in 4 weeks to check PTH, renal US and assess response to therapy on duloxetine.   Riccardo Dubin, MD   06/20/2015, 4:07 PM

## 2015-06-20 NOTE — Assessment & Plan Note (Addendum)
Overview His sister reports today that his fourth and fifth digits of the right hand "go the other way" periodically which has been noticeable over the last several months. He himself cannot identify any associated factors that precipitate or alleviate the symptoms and it is associated with some numbness/tingling at the fingertips. 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 He also denies prior history of trauma to the hand or any recent injury. 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  Assessment Intermittent hand cramping of unknown etiology. The unilateral nature of the symptoms makes this seem less likely that it is a systemic cause though he did have some renal insufficiency noted on most recent BMET with GFR in the high 50s, high normal K 5.2 back in February. No deformities noted on physical exam which is reassuring.  Plan Recheck BMET today  ADDENDUM 06/20/2015  10:22 AM:  BMET with reassuring K 4.3 though GFR appears stable at 50s now on two BMETs which appears consistent with chronic kidney disease Stage 3.

## 2015-06-20 NOTE — Telephone Encounter (Signed)
Pt's sister calls and wants to discuss pt's labs with you, she states he is not taking any supplements for vit D, she would like for you to call her this evening at either ph #'s on the chart

## 2015-06-20 NOTE — Assessment & Plan Note (Addendum)
Overview Vitamin D was low normal 31 with last checked in December 2015. He is not taking any ongoing supplementation and is agreeable to having a recheck today.  Assessment At risk for vitamin D deficiency given prior history.  Plan Check vitamin D level today  ADDENDUM 06/20/2015  10:25 AM:  Vit D 29.4 which is borderline mild deficiency. I will call to see what he is taking in the way of vitamin supplements.

## 2015-06-20 NOTE — Progress Notes (Signed)
Internal Medicine Clinic Attending  Case discussed with Dr. Patel,Rushil at the time of the visit.  We reviewed the resident's history and exam and pertinent patient test results.  I agree with the assessment, diagnosis, and plan of care documented in the resident's note.  

## 2015-06-20 NOTE — Assessment & Plan Note (Addendum)
Overview Since starting duloxetine 30mg , he reports feeling better. His sister confirms this report as well with regards to anxiety and mood, and she denies any worsening changes of his concentration, interest, feelings of guilt, difficulty sleeping. She doesn't nausea however that he does have days where gets frustrated with his ongoing memory loss, most recently 3 days ago, but he is learning to live with his situation.  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 He appears to be tolerating medicines well without adverse effects of worsening mood or suicidal ideation which is promising.  Plan Reassess in one month to see how he is doing on the medication, preferably with PCP if possible

## 2015-06-20 NOTE — Telephone Encounter (Signed)
I tried reaching her another two times without success. If she calls again tomorrow during daytime hours, please let her know of the following:  -Prescription for Vit D 1000 u daily -Follow-up in 1 month to reassess response to duloxetine -Possible labwork to assess kidneys: urine studies, renal US

## 2015-06-21 NOTE — Telephone Encounter (Signed)
Dr patel spoke to sister this am, they will be in touch

## 2015-06-23 DIAGNOSIS — R7989 Other specified abnormal findings of blood chemistry: Secondary | ICD-10-CM | POA: Insufficient documentation

## 2015-06-23 NOTE — Addendum Note (Signed)
Addended by: Riccardo Dubin on: 06/23/2015 06:49 AM   Modules accepted: Orders, Medications

## 2015-06-23 NOTE — Assessment & Plan Note (Signed)
ADDENDUM 06/23/2015  6:48 AM:  GFR now trending in the upper 50s on last BMETs. No risk factors like HTN or DM2. Repeat BMET in 3-6 months to see if this is new baseline.

## 2015-06-27 ENCOUNTER — Ambulatory Visit (INDEPENDENT_AMBULATORY_CARE_PROVIDER_SITE_OTHER): Payer: Commercial Managed Care - HMO | Admitting: Internal Medicine

## 2015-06-27 ENCOUNTER — Encounter: Payer: Self-pay | Admitting: Internal Medicine

## 2015-06-27 VITALS — BP 136/115 | HR 70 | Temp 97.5°F | Ht 69.0 in | Wt 183.1 lb

## 2015-06-27 DIAGNOSIS — R319 Hematuria, unspecified: Secondary | ICD-10-CM | POA: Diagnosis not present

## 2015-06-27 DIAGNOSIS — R110 Nausea: Secondary | ICD-10-CM

## 2015-06-27 DIAGNOSIS — R8299 Other abnormal findings in urine: Secondary | ICD-10-CM

## 2015-06-27 DIAGNOSIS — L905 Scar conditions and fibrosis of skin: Secondary | ICD-10-CM

## 2015-06-27 DIAGNOSIS — R197 Diarrhea, unspecified: Secondary | ICD-10-CM | POA: Diagnosis not present

## 2015-06-27 DIAGNOSIS — Z8719 Personal history of other diseases of the digestive system: Secondary | ICD-10-CM

## 2015-06-27 DIAGNOSIS — Z9889 Other specified postprocedural states: Secondary | ICD-10-CM

## 2015-06-27 LAB — COMPREHENSIVE METABOLIC PANEL
ALK PHOS: 55 U/L (ref 38–126)
ALT: 24 U/L (ref 17–63)
AST: 28 U/L (ref 15–41)
Albumin: 3.8 g/dL (ref 3.5–5.0)
Anion gap: 6 (ref 5–15)
BUN: 17 mg/dL (ref 6–20)
CALCIUM: 9.2 mg/dL (ref 8.9–10.3)
CHLORIDE: 105 mmol/L (ref 101–111)
CO2: 26 mmol/L (ref 22–32)
CREATININE: 1.23 mg/dL (ref 0.61–1.24)
GFR, EST NON AFRICAN AMERICAN: 59 mL/min — AB (ref >60)
Glucose, Bld: 105 mg/dL — ABNORMAL HIGH (ref 65–99)
Potassium: 3.8 mmol/L (ref 3.5–5.1)
Sodium: 137 mmol/L (ref 135–145)
Total Bilirubin: 0.4 mg/dL (ref 0.3–1.2)
Total Protein: 7.1 g/dL (ref 6.5–8.1)

## 2015-06-27 LAB — POCT URINALYSIS DIPSTICK
BILIRUBIN UA: NEGATIVE
GLUCOSE UA: NEGATIVE
Nitrite, UA: NEGATIVE
SPEC GRAV UA: 1.02
Urobilinogen, UA: 0.2
pH, UA: 5.5

## 2015-06-27 LAB — CBC WITH DIFFERENTIAL/PLATELET
BASOS ABS: 0 10*3/uL (ref 0.0–0.1)
Basophils Relative: 0 %
EOS ABS: 0 10*3/uL (ref 0.0–0.7)
EOS PCT: 0 %
HCT: 44.1 % (ref 39.0–52.0)
Hemoglobin: 14.2 g/dL (ref 13.0–17.0)
LYMPHS PCT: 20 %
Lymphs Abs: 1.2 10*3/uL (ref 0.7–4.0)
MCH: 29.1 pg (ref 26.0–34.0)
MCHC: 32.2 g/dL (ref 30.0–36.0)
MCV: 90.4 fL (ref 78.0–100.0)
Monocytes Absolute: 0.5 10*3/uL (ref 0.1–1.0)
Monocytes Relative: 8 %
Neutro Abs: 4.3 10*3/uL (ref 1.7–7.7)
Neutrophils Relative %: 72 %
PLATELETS: 229 10*3/uL (ref 150–400)
RBC: 4.88 MIL/uL (ref 4.22–5.81)
RDW: 13.3 % (ref 11.5–15.5)
WBC: 6.1 10*3/uL (ref 4.0–10.5)

## 2015-06-27 MED ORDER — CIPROFLOXACIN HCL 500 MG PO TABS: 500.0000 mg | ORAL_TABLET | Freq: Two times a day (BID) | ORAL | Status: DC

## 2015-06-27 NOTE — Assessment & Plan Note (Addendum)
Pt p/w son (pt with dementia and is a very poor historian) for acute diarrhea going on for the past 2 weeks.  Diarrhea is occurring approximately 2 times per day and describes diarrhea as watery. Associated with nausea and decreased appetite.  Denies wt loss.  Patient denies blood in stool, fever/chills, vomiting, chest pain, dyspnea, or previous episodes of diarrhea.  Previous visits for diarrhea: none.  Denies sick contacts, unusual foods, travel.  Not associated with food.  No new medications, although he is taking aricept which can cause some GI upset.  The son believes it is not the aricept because he has been on this for a while without problems.  UA with blood, leukocytes, protein, and ketones.  Other labs OK.  Never had a colonoscopy.  No urinary symptoms although the RN reports he had difficulty urinating.  He does report maybe some hesitancy at times with urination and possibly decreased stream.  I am uncertain what the etiology is but discussed with son the findings on UA and will treat for UTI.  VSS and he is eating and drinking OK.   -will give cipro x 7 days for UTI  -return precautions provided to the son -will need to f/u with repeat UA at next OV to ensure resolution of hematuria (may need urology referral as well)

## 2015-06-27 NOTE — Progress Notes (Signed)
Patient ID: Jordan Long., male   DOB: 30-Nov-1950, 65 y.o.   MRN: [id number]     Subjective:   Patient ID: Jordan Long. male    DOB: [birth year] 65 y.o.    MRN: [id number] Health Maintenance Due: Health Maintenance Due  Topic Date Due  . Hepatitis C Screening  03-03-1950  . COLONOSCOPY  09/12/1998  . ZOSTAVAX  09/11/2008  . PNA vac Low Risk Adult (2 of 2 - PCV13) 12/26/2014  . COLON CANCER SCREENING ANNUAL FOBT  01/06/2015    _________________________________________________  HPI: [name] is a 65 y.o. male here for an acute visit for diarrhea and nausea x 2 wks.  Pt has a PMH outlined below.  Please see problem-based charting assessment and plan for further status of patient's chronic medical problems addressed at today's visit.  PMH: Past Medical History  Diagnosis Date  . DVT (deep venous thrombosis) ([place]) 2010 and 10/2010    after Hernia repair in 2010 s/p IVC filter and coumadin. Next episode after Hernia repair  ( 10/23/2010) . Admitted on 11/02/2010 for repeat DVT. Warfarin was stopped for  5 days before surgery.   . Incisional hernia 08/20/2010  . Constipation 11/20/2010  . GERD (gastroesophageal reflux disease)   . Pulmonary embolism ([place]) 2010  . DVT (deep venous thrombosis) ([place]) 10/2011    right leg after knee surgery    Medications: Current Outpatient Prescriptions on File Prior to Visit  Medication Sig Dispense Refill  . acetaminophen (TYLENOL) 500 MG tablet Take 500 mg by mouth every 6 (six) hours as needed for pain.     Marland Kitchen donepezil (ARICEPT) 5 MG tablet TAKE 1 TABLET BY MOUTH AT BEDTIME 30 tablet 6  . DULoxetine (CYMBALTA) 30 MG capsule Take 1 capsule (30 mg total) by mouth daily. 30 capsule 0  . omeprazole (PRILOSEC) 20 MG capsule Take 1 capsule (20 mg total) by mouth daily. 30 capsule 1  . XARELTO 20 MG TABS tablet Take 20 mg by mouth daily.  12   No current facility-administered medications on file prior to  visit.    Allergies: No Known Allergies  FH: Family History  Problem Relation Age of Onset  . Cancer Father     Lymphoma   . Raynaud syndrome Father   . Diabetes Mother   . Parkinsonism Mother   . Thyroid disease Sister   . Diabetes Sister   . Raynaud syndrome Sister   . Lupus Sister   . Raynaud syndrome Sister   . Colon cancer Neg Hx   . Heart disease Mother   . Heart attack      neice  . Schizophrenia Mother     SH: Social History   Social History  . Marital Status: Single    Spouse Name: N/A  . Number of Children: 2  . Years of Education: N/A   Occupational History  . retired [employer] History Main Topics  . Smoking status: Former Smoker -- 2 years    Types: Cigarettes    Quit date: 09/23/1985  . Smokeless tobacco: Never Used  . Alcohol Use: No  . Drug Use: No  . Sexual Activity: Not on file   Other Topics Concern  . Not on file   Social History Narrative   Lives alone   Retired   Doesn't not drink caffeine     Review of Systems: Constitutional: Negative for fever, chills.  Eyes: Negative for blurred vision.  Respiratory:  Negative for cough and shortness of breath.  Cardiovascular: Negative for chest pain.  Gastrointestinal: +nausea, +diarrhea, neg vomiting. Neurological: Negative for dizziness.   Objective:   Vital Signs: Filed Vitals:   06/27/15 1340  BP: 136/115  Pulse: 70  Temp: 97.5 F (36.4 C)  TempSrc: Oral  Height: 5\' 9"  (1.753 m)  Weight: 183 lb 1.6 oz (83.054 kg)  SpO2: 97%      BP Readings from Last 3 Encounters:  06/27/15 136/115  06/19/15 116/70  06/04/15 123/78    Physical Exam: Constitutional: Vital signs reviewed.  Patient is in NAD and cooperative with exam.  Head: Normocephalic and atraumatic. Eyes: EOMI, conjunctivae nl, no scleral icterus.  Neck: Supple. Cardiovascular: RRR, no MRG. Pulmonary/Chest: normal effort, CTAB, no wheezes, rales, or rhonchi. Abdominal: Soft. NT/ND decreased BS,  midline hernia repair scar.  Neurological: A&O x3, cranial nerves II-XII are grossly intact, moving all extremities. Skin: Warm, dry and intact. No rash.   Assessment & Plan:   Assessment and plan was discussed and formulated with my attending.

## 2015-06-27 NOTE — Patient Instructions (Addendum)
Thank you for your visit today.   Please return to the internal medicine clinic in next month(s) or sooner if needed.   I will give you an antibiotic to take every 12 hours for 7 days.   Please call us back and let us know if your symptoms worsen or are not improving.   I will also check some labs today.  Please be sure to bring all of your medications with you to every visit; this includes herbal supplements, vitamins, eye drops, and any over-the-counter medications.   Should you have any questions regarding your medications and/or any new or worsening symptoms, please be sure to call the clinic at [phone].   If you believe that you are suffering from a life threatening condition or one that may result in the loss of limb or function, then you should call 911 and proceed to the nearest Emergency Department.

## 2015-06-27 NOTE — Assessment & Plan Note (Signed)
Pt reports some pain at the incisional site that comes and goes for the past 3 months.  States the pain is unrelated to his current diarrhea.  Abdomen soft on exam with previous surgical scar. -cont to monitor -may need to f/u with surgery if continued pain

## 2015-06-28 ENCOUNTER — Telehealth: Payer: Self-pay

## 2015-06-28 ENCOUNTER — Encounter: Payer: Self-pay | Admitting: Internal Medicine

## 2015-06-28 ENCOUNTER — Ambulatory Visit (INDEPENDENT_AMBULATORY_CARE_PROVIDER_SITE_OTHER): Payer: Commercial Managed Care - HMO | Admitting: Internal Medicine

## 2015-06-28 VITALS — BP 129/79 | HR 62 | Temp 97.7°F | Wt 184.8 lb

## 2015-06-28 DIAGNOSIS — N3001 Acute cystitis with hematuria: Secondary | ICD-10-CM | POA: Diagnosis not present

## 2015-06-28 NOTE — Progress Notes (Signed)
   Subjective:    Patient ID: Jordan Long., male    DOB: 09/15/1950, 65 y.o.   MRN: [id number]  HPI Jordan Long is a 65 year old male who presents today with his sister for sweating spells. Please see assessment & plan for status of chronic medical problems.    Review of Systems     Objective:   Physical Exam  Constitutional: He is oriented to person, place, and time. He appears well-developed and well-nourished.  HENT:  Head: Normocephalic and atraumatic.  Eyes: Conjunctivae are normal. No scleral icterus.  Neck: Normal range of motion. No tracheal deviation present.  Cardiovascular: Normal rate and regular rhythm.   Pulmonary/Chest: Effort normal. No respiratory distress.  Abdominal: Soft. Bowel sounds are normal. He exhibits no distension and no mass. There is no tenderness. There is no rebound and no guarding.  Umbilical scar noted consistent with prior ventral hernia repair  Neurological: He is alert and oriented to person, place, and time.  Skin: Skin is warm and dry. He is not diaphoretic.          Assessment & Plan:

## 2015-06-28 NOTE — Telephone Encounter (Signed)
Spoke with sister. Concerned that patient is still experiencing nausea and dizziness at times. Concerned that this is related to Cymbalta as symptoms started around the same time as initiation of this medicine. She told me that she was going to stop giving this to him. I encouraged her to bring him back for [place] to evaluate before stopping his medicine and she declines. I educated her about not stopping that particular medication abruptly and to wean off. Also reviewed the importance of him finishing his antibiotic course completely and to be sure to take this with food as he is already feeling nauseated. Sister agreed and understood.

## 2015-06-28 NOTE — Telephone Encounter (Signed)
Pt sister called back asking to speak with Caryl Pina. Please call back.

## 2015-06-28 NOTE — Progress Notes (Signed)
Case discussed with Dr. Gill soon after the resident saw the patient.  We reviewed the resident's history and exam and pertinent patient test results.  I agree with the assessment, diagnosis and plan of care documented in the resident's note. 

## 2015-06-28 NOTE — Assessment & Plan Note (Addendum)
Assessment The past 2-3 days, his sister reports he has been tired feeling and weak. This is been associated with loose stools, nausea, dizziness, but what is most concerning to his sister are the "sweating spells" he has been experiencing over the last 2 days. He denies any fever, chills, vomiting, cough, congestion, anxiety. He does report adherence to ciprofloxacin 500 mg which they picked up after the visit yesterday after being diagnosed with acute complicated cystitis given urinalysis findings. Interestingly enough, he also has not had any dysuria though has had increased frequency in the week before his visit yesterday per his sister. Overall, he does not feel he is worsening but does not feel he is any better since symptom onset.  As visit yesterday, it was felt the loose stools were the predominant symptom though history was different today in the presence of his sister. Unclear if there is any association of his loose stools with Cymbalta as he does not feel today it is related to when he started taking this medication.  Plan -Reassured the patient and his sister that this will take some time for improvement. Encouraged fluid intake. -Offered to recheck urinalysis today but the patient had already voided -Offered close follow-up next week should he not improve as well as return precautions to the emergency department -Check orthostatic vital signs to ensure that "sweating spells" are not episodes of orthostatic hypotension. Vital signs have been documented by RN and are reassuring for no hypovolemia.

## 2015-06-28 NOTE — Telephone Encounter (Signed)
Pt sister requesting the nurse to call back regarding pt behavior.

## 2015-06-28 NOTE — Patient Instructions (Addendum)
Continue taking the antibiotic as prescribed.   Let's see you back early next week to make sure you are improving.  Signs for which you should go to the ED Fever with temperature >100.62F Severe abdominal pain Vomiting Passing out

## 2015-06-28 NOTE — Telephone Encounter (Signed)
Spoke with sister who now reports that patient is diaphoretic and pale. B/P of 122/72 and pulse of 64 via electric blood pressure cuff. Patient denies chest pain. Ate "noodle soup" half hour prior. Scheduled patient for 3:15 with Dr. Posey Pronto.    FYI

## 2015-06-29 LAB — URINE CULTURE: Organism ID, Bacteria: NO GROWTH

## 2015-07-01 ENCOUNTER — Other Ambulatory Visit: Payer: Self-pay | Admitting: Internal Medicine

## 2015-07-04 NOTE — Progress Notes (Signed)
Internal Medicine Clinic Attending  Case discussed with Dr. Patel,Rushil at the time of the visit.  We reviewed the resident's history and exam and pertinent patient test results.  I agree with the assessment, diagnosis, and plan of care documented in the resident's note.  

## 2015-07-05 ENCOUNTER — Ambulatory Visit: Payer: Commercial Managed Care - HMO | Admitting: Podiatry

## 2015-07-05 NOTE — Addendum Note (Signed)
Addended by: Hulan Fray on: 07/05/2015 04:38 PM   Modules accepted: Orders

## 2015-07-12 ENCOUNTER — Telehealth: Payer: Self-pay

## 2015-07-12 NOTE — Telephone Encounter (Addendum)
Jordan Long. is a 65 y.o. male who was contacted via telephone for monitoring of rivaroxaban (Xarelto) therapy.  Patient has Alzheimer's so spoke with sister that was present.   ASSESSMENT Indication(s): DVT Duration: indefinite  Labs:    Component Value Date/Time   AST 28 06/27/2015 1444   ALT 24 06/27/2015 1444   NA 137 06/27/2015 1444   NA 142 06/19/2015 1359   K 3.8 06/27/2015 1444   CL 105 06/27/2015 1444   CO2 26 06/27/2015 1444   GLUCOSE 105* 06/27/2015 1444   GLUCOSE 96 06/19/2015 1359   HGBA1C 5.4 03/02/2011 0953   HGBA1C 5.7* 02/21/2011 2154   BUN 17 06/27/2015 1444   BUN 23 06/19/2015 1359   CREATININE 1.23 06/27/2015 1444   CREATININE 1.37* 06/08/2012 1521   CALCIUM 9.2 06/27/2015 1444   GFRNONAA 59* 06/27/2015 1444   GFRAA >60 06/27/2015 1444   WBC 6.1 06/27/2015 1444   WBC 8.3 03/07/2015 1421   HGB 14.2 06/27/2015 1444   HCT 44.1 06/27/2015 1444   HCT 41.5 03/07/2015 1421   PLT 229 06/27/2015 1444   PLT 322 03/07/2015 1421    rivaroxaban (Xarelto) Dose: 20 mg daily  Safety: Patient has not had any recent bleeding/thromboembolic events. Patient reports no recent signs or symptoms of bleeding, no signs of symptoms of thromboembolism. Medication changes: no.  Adherence: Patient has no known adherence challenges. Patient does correctly recite the dose. Contacted pharmacy and records indicate refills are consistent. Refills: 5/20, 4/22, 3/25. Sister stated may need assistance with cost in the fall.   Patient Instructions: Patient advised to contact clinic or seek medical attention if signs/symptoms of bleeding or thromboembolism occur. Patient verbalized understanding by repeating back information.  Follow-up Recommended labs to consider: A1c. Next appointment: 07/22/2015 Internal Med follow up.  Angelena Form PharmD Candidate  07/12/2015, 2:46 PM

## 2015-07-22 ENCOUNTER — Ambulatory Visit (INDEPENDENT_AMBULATORY_CARE_PROVIDER_SITE_OTHER): Payer: Commercial Managed Care - HMO | Admitting: Internal Medicine

## 2015-07-22 ENCOUNTER — Encounter: Payer: Self-pay | Admitting: Internal Medicine

## 2015-07-22 VITALS — BP 116/75 | HR 64 | Temp 98.2°F | Ht 69.0 in | Wt 180.9 lb

## 2015-07-22 DIAGNOSIS — M25562 Pain in left knee: Secondary | ICD-10-CM | POA: Diagnosis not present

## 2015-07-22 DIAGNOSIS — M25561 Pain in right knee: Secondary | ICD-10-CM

## 2015-07-22 DIAGNOSIS — R319 Hematuria, unspecified: Secondary | ICD-10-CM | POA: Diagnosis not present

## 2015-07-22 DIAGNOSIS — G309 Alzheimer's disease, unspecified: Secondary | ICD-10-CM | POA: Diagnosis not present

## 2015-07-22 DIAGNOSIS — F0280 Dementia in other diseases classified elsewhere without behavioral disturbance: Secondary | ICD-10-CM

## 2015-07-22 NOTE — Assessment & Plan Note (Signed)
HPI: Has chronic bilateral knee pain, usually wears a brace on his right knee however sister has noticed that it is wearing into his skin some.  She wants advice about a better brace.  They have a good relationship with Dr Noemi Chapel and would be happy to return to see him.    A: Bilateral knee pain, Primary OA of bilateral knees  P: Referal to orthopaedic surgery.

## 2015-07-22 NOTE — Progress Notes (Signed)
[facility] Subjective:   Patient ID: Jordan Long. male   DOB: Apr 04, 1950 65 y.o.   MRN: [id number]  HPI: JordanBolton Favian Long. is a 65 y.o. male with a PMH detailed below who presents for 3 week follow up for UTI/ hematuria  He is accompanied by his sister who helps care for him.  Please see problem based charting below for the status of his chronic medical problems.    Past Medical History  Diagnosis Date  . DVT (deep venous thrombosis) ([place]) 2010 and 10/2010    after Hernia repair in 2010 s/p IVC filter and coumadin. Next episode after Hernia repair  ( 10/23/2010) . Admitted on 11/02/2010 for repeat DVT. Warfarin was stopped for  5 days before surgery.   . Incisional hernia 08/20/2010  . Constipation 11/20/2010  . GERD (gastroesophageal reflux disease)   . Pulmonary embolism ([place]) 2010  . DVT (deep venous thrombosis) ([place]) 10/2011    right leg after knee surgery   Current Outpatient Prescriptions  Medication Sig Dispense Refill  . acetaminophen (TYLENOL) 500 MG tablet Take 500 mg by mouth every 6 (six) hours as needed for pain.     . ciprofloxacin (CIPRO) 500 MG tablet Take 1 tablet (500 mg total) by mouth every 12 (twelve) hours. 14 tablet 0  . donepezil (ARICEPT) 5 MG tablet TAKE 1 TABLET BY MOUTH AT BEDTIME 30 tablet 6  . DULoxetine (CYMBALTA) 30 MG capsule TAKE 1 CAPSULE (30 MG TOTAL) BY MOUTH DAILY. 30 capsule 5  . omeprazole (PRILOSEC) 20 MG capsule Take 1 capsule (20 mg total) by mouth daily. 30 capsule 1  . XARELTO 20 MG TABS tablet Take 20 mg by mouth daily.  12   No current facility-administered medications for this visit.   Family History  Problem Relation Age of Onset  . Cancer Father     Lymphoma   . Raynaud syndrome Father   . Diabetes Mother   . Parkinsonism Mother   . Thyroid disease Sister   . Diabetes Sister   . Raynaud syndrome Sister   . Lupus Sister   . Raynaud syndrome Sister   . Colon cancer Neg Hx    . Heart disease Mother   . Heart attack      neice  . Schizophrenia Mother    Social History   Social History  . Marital Status: Single    Spouse Name: N/A  . Number of Children: 2  . Years of Education: N/A   Occupational History  . retired [employer] History Main Topics  . Smoking status: Former Smoker -- 2 years    Types: Cigarettes    Quit date: 09/23/1985  . Smokeless tobacco: Never Used  . Alcohol Use: No  . Drug Use: No  . Sexual Activity: Not Asked   Other Topics Concern  . None   Social History Narrative   Lives alone   Retired   Doesn't not drink caffeine    Review of Systems: Review of Systems  Constitutional: Negative for fever.  Eyes: Negative for blurred vision.  Respiratory: Negative for cough.   Cardiovascular: Negative for chest pain.  Gastrointestinal: Negative for abdominal pain and diarrhea.  Genitourinary: Negative for dysuria, urgency, hematuria and flank pain.  Musculoskeletal: Positive for joint pain. Negative for falls.  Neurological: Negative for headaches.     Objective:  Physical Exam: Filed Vitals:   07/22/15 1002  BP: 116/75  Pulse: 64  Temp:  98.2 F (36.8 C)  TempSrc: Oral  Height: 5\' 9"  (1.753 m)  Weight: 180 lb 14.4 oz (82.056 kg)  SpO2: 99%   Physical Exam  Constitutional: He is well-developed, well-nourished, and in no distress.  Cardiovascular: Normal rate and regular rhythm.   Pulmonary/Chest: Effort normal and breath sounds normal.  Abdominal: Soft. Bowel sounds are normal.  Musculoskeletal:       Right knee: He exhibits no effusion and no erythema. Tenderness found. Medial joint line tenderness noted. No lateral joint line tenderness noted.       Left knee: He exhibits no swelling and no erythema. Tenderness found. Medial joint line and lateral joint line tenderness noted.  Neurological:  Oriented to person and place but not time  Skin:  Skin abrasion of posterior leg consistent with wear from  knee brace  Nursing note and vitals reviewed.    Assessment & Plan:  Case discussed with Dr. Lynnae January  Hematuria HPI: Has completed course of Cipro, feels back to normal, has not appreciated any gross hematuria.  A: Hematuria  P: Repeat UA to ensure clearance of hematuria.  Knee pain, bilateral HPI: Has chronic bilateral knee pain, usually wears a brace on his right knee however sister has noticed that it is wearing into his skin some.  She wants advice about a better brace.  They have a good relationship with Dr Noemi Chapel and would be happy to return to see him.    A: Bilateral knee pain, Primary OA of bilateral knees  P: Referal to orthopaedic surgery.  Alzheimer's dementia HPI: His sister is concerned about his dementia, she is wondering if there have been any new advances in the field.  She is also concerned he is still continuing to deteriorate.  He remains semi-independent with the the help of his children and herself but she is concerned they may need to look into an ALF if he continues to decline or if one of his children moves away.    A: Alzheimer's dementia  P: -Discussed options for treatment of Alz dementia include increasing Aricept to 10mg  daily, can also add namenda 10mg  BID, however I also noted that this recent UTI may have played a role. We will have him back for a repeat visit with his PCP in 3 months.  In the interim his sister will look into ALF options, I also encouraged her to look into the PACE program since they seem to have a strong since of family and remaining home and independent as long as possible.    Medications Ordered No orders of the defined types were placed in this encounter.   Other Orders Orders Placed This Encounter  Procedures  . Urinalysis, Reflex Microscopic    Standing Status: Future     Number of Occurrences:      Standing Expiration Date: 08/02/2015  . Ambulatory referral to Orthopedic Surgery    Referral Priority:  Routine     Referral Type:  Surgical    Referral Reason:  Specialty Services Required    Requested Specialty:  Orthopedic Surgery    Number of Visits Requested:  1   Follow Up: Return in about 3 months (around 10/22/2015).

## 2015-07-22 NOTE — Patient Instructions (Signed)
General Instructions:  I am going to place a referral over to your orthopedist.  Please bring your medicines with you each time you come to clinic.  Medicines may include prescription medications, over-the-counter medications, herbal remedies, eye drops, vitamins, or other pills.   Progress Toward Treatment Goals:  No flowsheet data found.  Self Care Goals & Plans:  No flowsheet data found.  No flowsheet data found.   Care Management & Community Referrals:  No flowsheet data found.

## 2015-07-22 NOTE — Assessment & Plan Note (Signed)
HPI: His sister is concerned about his dementia, she is wondering if there have been any new advances in the field.  She is also concerned he is still continuing to deteriorate.  He remains semi-independent with the the help of his children and herself but she is concerned they may need to look into an ALF if he continues to decline or if one of his children moves away.    A: Alzheimer's dementia  P: -Discussed options for treatment of Alz dementia include increasing Aricept to 10mg  daily, can also add namenda 10mg  BID, however I also noted that this recent UTI may have played a role. We will have him back for a repeat visit with his PCP in 3 months.  In the interim his sister will look into ALF options, I also encouraged her to look into the PACE program since they seem to have a strong since of family and remaining home and independent as long as possible.

## 2015-07-22 NOTE — Assessment & Plan Note (Signed)
HPI: Has completed course of Cipro, feels back to normal, has not appreciated any gross hematuria.  A: Hematuria  P: Repeat UA to ensure clearance of hematuria.

## 2015-07-23 NOTE — Progress Notes (Signed)
Internal Medicine Clinic Attending  Case discussed with Dr. Hoffman soon after the resident saw the patient.  We reviewed the resident's history and exam and pertinent patient test results.  I agree with the assessment, diagnosis, and plan of care documented in the resident's note. 

## 2015-07-27 ENCOUNTER — Other Ambulatory Visit: Payer: Self-pay | Admitting: Internal Medicine

## 2015-08-01 DIAGNOSIS — M1711 Unilateral primary osteoarthritis, right knee: Secondary | ICD-10-CM | POA: Diagnosis not present

## 2015-09-12 ENCOUNTER — Telehealth: Payer: Self-pay | Admitting: *Deleted

## 2015-09-13 ENCOUNTER — Other Ambulatory Visit: Payer: Commercial Managed Care - HMO

## 2015-09-13 DIAGNOSIS — R319 Hematuria, unspecified: Secondary | ICD-10-CM

## 2015-09-13 LAB — URINALYSIS, ROUTINE W REFLEX MICROSCOPIC
Bilirubin Urine: NEGATIVE
Glucose, UA: NEGATIVE mg/dL
Ketones, ur: NEGATIVE mg/dL
LEUKOCYTES UA: NEGATIVE
Nitrite: NEGATIVE
Protein, ur: NEGATIVE mg/dL
SPECIFIC GRAVITY, URINE: 1.023 (ref 1.005–1.030)
pH: 6.5 (ref 5.0–8.0)

## 2015-09-13 LAB — URINE MICROSCOPIC-ADD ON
Bacteria, UA: NONE SEEN
Squamous Epithelial / LPF: NONE SEEN
WBC UA: NONE SEEN WBC/hpf (ref 0–5)

## 2015-09-13 NOTE — Addendum Note (Signed)
Addended by: Truddie Crumble on: 09/13/2015 01:53 PM   Modules accepted: Orders

## 2015-09-16 NOTE — Telephone Encounter (Signed)
Called pt's sister and ask for a urine to be collected, done

## 2015-09-23 ENCOUNTER — Other Ambulatory Visit: Payer: Self-pay | Admitting: Student in an Organized Health Care Education/Training Program

## 2015-10-12 ENCOUNTER — Other Ambulatory Visit: Payer: Self-pay | Admitting: Student in an Organized Health Care Education/Training Program

## 2015-11-21 ENCOUNTER — Other Ambulatory Visit: Payer: Self-pay | Admitting: Student in an Organized Health Care Education/Training Program

## 2015-12-17 ENCOUNTER — Other Ambulatory Visit: Payer: Self-pay | Admitting: Student in an Organized Health Care Education/Training Program

## 2015-12-17 NOTE — Telephone Encounter (Signed)
This refill is fine but can you please schedule him an appointment with me please? I have not met him yet, he last saw Shilpa August 2016 and I need to evaluate him. Thanks.  

## 2015-12-17 NOTE — Telephone Encounter (Signed)
Message sent to front office for scheduling.Despina Hidden Cassady11/14/20171:37 PM

## 2016-01-13 ENCOUNTER — Ambulatory Visit: Payer: Commercial Managed Care - HMO | Admitting: Student in an Organized Health Care Education/Training Program

## 2016-01-20 ENCOUNTER — Encounter: Payer: Self-pay | Admitting: Student in an Organized Health Care Education/Training Program

## 2016-01-20 ENCOUNTER — Encounter (INDEPENDENT_AMBULATORY_CARE_PROVIDER_SITE_OTHER): Payer: Self-pay

## 2016-01-20 ENCOUNTER — Other Ambulatory Visit: Payer: Self-pay | Admitting: Student in an Organized Health Care Education/Training Program

## 2016-01-20 ENCOUNTER — Ambulatory Visit (INDEPENDENT_AMBULATORY_CARE_PROVIDER_SITE_OTHER): Payer: Commercial Managed Care - HMO | Admitting: Student in an Organized Health Care Education/Training Program

## 2016-01-20 VITALS — BP 123/77 | HR 58 | Temp 97.8°F | Ht 69.0 in | Wt 190.9 lb

## 2016-01-20 DIAGNOSIS — D6859 Other primary thrombophilia: Secondary | ICD-10-CM | POA: Diagnosis not present

## 2016-01-20 DIAGNOSIS — K219 Gastro-esophageal reflux disease without esophagitis: Secondary | ICD-10-CM

## 2016-01-20 DIAGNOSIS — Z86711 Personal history of pulmonary embolism: Secondary | ICD-10-CM | POA: Diagnosis not present

## 2016-01-20 DIAGNOSIS — Z95828 Presence of other vascular implants and grafts: Secondary | ICD-10-CM

## 2016-01-20 DIAGNOSIS — M170 Bilateral primary osteoarthritis of knee: Secondary | ICD-10-CM | POA: Diagnosis not present

## 2016-01-20 DIAGNOSIS — Z87448 Personal history of other diseases of urinary system: Secondary | ICD-10-CM

## 2016-01-20 DIAGNOSIS — F411 Generalized anxiety disorder: Secondary | ICD-10-CM

## 2016-01-20 DIAGNOSIS — R3121 Asymptomatic microscopic hematuria: Secondary | ICD-10-CM

## 2016-01-20 DIAGNOSIS — Z86718 Personal history of other venous thrombosis and embolism: Secondary | ICD-10-CM

## 2016-01-20 DIAGNOSIS — G309 Alzheimer's disease, unspecified: Secondary | ICD-10-CM

## 2016-01-20 DIAGNOSIS — F0280 Dementia in other diseases classified elsewhere without behavioral disturbance: Secondary | ICD-10-CM

## 2016-01-20 DIAGNOSIS — Z7901 Long term (current) use of anticoagulants: Secondary | ICD-10-CM | POA: Diagnosis not present

## 2016-01-20 DIAGNOSIS — Z79899 Other long term (current) drug therapy: Secondary | ICD-10-CM

## 2016-01-20 DIAGNOSIS — Z87891 Personal history of nicotine dependence: Secondary | ICD-10-CM

## 2016-01-20 MED ORDER — DONEPEZIL HCL 10 MG PO TABS: 10.0000 mg | ORAL_TABLET | Freq: Every day | ORAL | 5 refills | Status: DC

## 2016-01-20 MED ORDER — DULOXETINE HCL 60 MG PO CPEP: 60.0000 mg | ORAL_CAPSULE | Freq: Every day | ORAL | 5 refills | Status: DC

## 2016-01-20 NOTE — Assessment & Plan Note (Signed)
Symptomatic osteoarthritis of the right knee worse than the left. Currently not limiting his functional status. Plan to continue conservative therapy with acetaminophen at home.

## 2016-01-20 NOTE — Progress Notes (Signed)
Assessment and Plan:  See Encounters tab for problem-based medical decision making.   __________________________________________________________  HPI:  65 year old man here for follow-up of Alzheimer's dementia. He has a history of moderate dementia was diagnosed about 2 years ago. He lives here in [place], he is a retired [employer] account executive. He has 2 sons that live in [place]. He has a sister [name]) who takes care of him during the day starting about 9 AM. The patient then goes back to his apartment and is on his own at night. This is reports that his memory has been about the same. He is unable to remember most of the events of the day. There are times when he gets frustrated and anxious, especially in the afternoon. She denies any combative behavior or safety concerns. The patient does not drive. No history of walking off for getting lost. She reports that they have been looking at memory units in [place], they [facility] with his sons and are interested in pursuing that in the future. [name] helps him with all of his medications in the morning. She does report that he has been anxious enough in the afternoon that she has given him a half a tablet of her own Xanax which has helped him calm down. Denies any recent illnesses. Denies any easy bleeding or bruising. Lower extremity swelling is stable.  __________________________________________________________  Problem List: Patient Active Problem List   Diagnosis Date Noted  . Hypercoagulable state ([place]) 01/20/2016    Priority: High  . Hematuria 06/27/2015    Priority: High  . Alzheimer's dementia 12/25/2013    Priority: High  . Osteoarthritis of knees, bilateral 07/13/2011    Priority: Medium  . Generalized anxiety disorder 03/02/2011    Priority: Medium  . Chronic venous insufficiency 06/04/2015    Priority: Low  . Dyslipidemia 06/13/2013    Priority: Low  . Preventative health care 12/09/2012   Priority: Low  . GERD (gastroesophageal reflux disease) 07/13/2011    Priority: Low    Medications: Reconciled today in Epic __________________________________________________________  Physical Exam:  Vital Signs: Vitals:   01/20/16 1127  BP: 123/77  Pulse: (!) 58  Temp: 97.8 F (36.6 C)  TempSrc: Oral  SpO2: 100%  Weight: 190 lb 14.4 oz (86.6 kg)  Height: 5\' 9"  (1.753 m)    Gen: Well appearing, NAD CV: RRR, no murmurs Pulm: Normal effort, CTA throughout, no wheezing Abd: Soft, NT, ND, normal BS.  Ext: Warm, 1+ equal bilateral pitting edema, right knee with significant crepitus, left knee normal. Skin: No atypical appearing moles. No rashes Neuro: Alert and cooperative, conversational, normal strength throughout.  MMSE:  0 3 3 3 0 2 1 3 1 1  1 Total 18

## 2016-01-20 NOTE — Assessment & Plan Note (Signed)
Reflux symptoms are currently stable. Plan to continue omeprazole 20 mg once daily.

## 2016-01-20 NOTE — Assessment & Plan Note (Signed)
On and off hematuria over the last year, so evidence of UTI. He has a remote smoking history, quit about 40 years ago. Plan to recheck urinalysis today still has microscopic hematuria plan to refer to urology for cystoscopy.

## 2016-01-20 NOTE — Assessment & Plan Note (Signed)
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 The last DVT was so extensive down his right leg that he had an IVC filter placed and the decision was made to continue anticoagulation indefinitely. He is doing well on several toe with no adverse side effects. Plan to check renal function today and will continue with xarelto at current dose.

## 2016-01-20 NOTE — Assessment & Plan Note (Signed)
Diagnosed at neurology in February 2016. This is early onset and seems to be progressing over the last 1 year. He relies on his sister for most of his activities of daily living. He is on his own at night in his apartment. Sister currently has no worries about his safety with her current situation. Repeats many mental status exam today was 18, down from 20 eighteen months ago. They started the process of looking into memory units in [place] which I encouraged. I doubt he is going to be safe living on his own at home at night for much longer. Also sensing there is a lot of caregiver fatigue from his sister [name]). Plan to increase to an episode from 5 mg to 10 mg and I gave the patient realistic expectations.

## 2016-01-20 NOTE — Assessment & Plan Note (Signed)
Patient with significant anxiety, sister reports increasing anxiety especially in the afternoons. I advised not to give the patient any benzos that are not prescribed for him. Instead I prefer to increase the duloxetine from 30 mg to 60 mg daily. Follow up in three months.

## 2016-01-21 ENCOUNTER — Encounter: Payer: Self-pay | Admitting: Student in an Organized Health Care Education/Training Program

## 2016-01-21 LAB — BMP8+ANION GAP
Anion Gap: 15 mmol/L (ref 10.0–18.0)
BUN/Creatinine Ratio: 18 (ref 10–24)
BUN: 22 mg/dL (ref 8–27)
CHLORIDE: 104 mmol/L (ref 96–106)
CO2: 24 mmol/L (ref 18–29)
Calcium: 9.4 mg/dL (ref 8.6–10.2)
Creatinine, Ser: 1.2 mg/dL (ref 0.76–1.27)
GFR calc Af Amer: 72 mL/min/{1.73_m2} (ref >59)
GFR calc non Af Amer: 62 mL/min/{1.73_m2} (ref >59)
GLUCOSE: 78 mg/dL (ref 65–99)
POTASSIUM: 4.5 mmol/L (ref 3.5–5.2)
Sodium: 143 mmol/L (ref 134–144)

## 2016-01-24 LAB — URINALYSIS, COMPLETE

## 2016-02-12 ENCOUNTER — Telehealth: Payer: Self-pay

## 2016-02-12 NOTE — Telephone Encounter (Signed)
Pt son called requesting to speak with a nurse regarding home  Health for the pt.

## 2016-02-13 DIAGNOSIS — I480 Paroxysmal atrial fibrillation: Secondary | ICD-10-CM | POA: Diagnosis not present

## 2016-02-13 DIAGNOSIS — Z86711 Personal history of pulmonary embolism: Secondary | ICD-10-CM | POA: Diagnosis not present

## 2016-02-13 DIAGNOSIS — Z7901 Long term (current) use of anticoagulants: Secondary | ICD-10-CM | POA: Diagnosis not present

## 2016-02-13 DIAGNOSIS — Z86718 Personal history of other venous thrombosis and embolism: Secondary | ICD-10-CM | POA: Diagnosis not present

## 2016-02-14 NOTE — Telephone Encounter (Signed)
Called 1/11 lm for rtc Called today lm for rtc

## 2016-02-14 NOTE — Telephone Encounter (Signed)
Called again lm for rtc

## 2016-03-20 ENCOUNTER — Telehealth: Payer: Self-pay

## 2016-03-20 NOTE — Telephone Encounter (Signed)
Needs to speak with a nurse regarding pt.

## 2016-03-20 NOTE — Telephone Encounter (Signed)
Pt's caregiver/ sister calls and wants dr Evette Doffing to know that this am pt did not know her or her dog for appr 1 hr, this upset her tremendously since this is the first time this has happened. She does feel pt needs to be seen but she did want dr Evette Doffing to know. She called pt's son and he has taken him to [place] for the weekend. She will call as things change

## 2016-03-20 NOTE — Telephone Encounter (Signed)
Thanks for letting me know. His dementia is progressing. I will follow him up in March.

## 2016-04-27 ENCOUNTER — Ambulatory Visit (INDEPENDENT_AMBULATORY_CARE_PROVIDER_SITE_OTHER): Payer: Medicare HMO | Admitting: Student in an Organized Health Care Education/Training Program

## 2016-04-27 ENCOUNTER — Telehealth: Payer: Self-pay

## 2016-04-27 ENCOUNTER — Encounter (INDEPENDENT_AMBULATORY_CARE_PROVIDER_SITE_OTHER): Payer: Self-pay

## 2016-04-27 DIAGNOSIS — G309 Alzheimer's disease, unspecified: Secondary | ICD-10-CM

## 2016-04-27 DIAGNOSIS — F0280 Dementia in other diseases classified elsewhere without behavioral disturbance: Secondary | ICD-10-CM

## 2016-04-27 DIAGNOSIS — D6869 Other thrombophilia: Secondary | ICD-10-CM

## 2016-04-27 DIAGNOSIS — Z86711 Personal history of pulmonary embolism: Secondary | ICD-10-CM | POA: Diagnosis not present

## 2016-04-27 DIAGNOSIS — D6859 Other primary thrombophilia: Secondary | ICD-10-CM

## 2016-04-27 DIAGNOSIS — Z7901 Long term (current) use of anticoagulants: Secondary | ICD-10-CM

## 2016-04-27 DIAGNOSIS — Z23 Encounter for immunization: Secondary | ICD-10-CM

## 2016-04-27 DIAGNOSIS — Z86718 Personal history of other venous thrombosis and embolism: Secondary | ICD-10-CM | POA: Diagnosis not present

## 2016-04-27 DIAGNOSIS — F411 Generalized anxiety disorder: Secondary | ICD-10-CM

## 2016-04-27 DIAGNOSIS — Z0000 Encounter for general adult medical examination without abnormal findings: Secondary | ICD-10-CM

## 2016-04-27 MED ORDER — ZOSTER VAC RECOMB ADJUVANTED 50 MCG/0.5ML IM SUSR: 0.5000 mL | Freq: Once | INTRAMUSCULAR | 0 refills | Status: AC

## 2016-04-27 NOTE — Assessment & Plan Note (Signed)
Colon cancer screening today. Historically he has done stool cards as his screening modality, I gave him cards at last visit but he did not complete them. We discussed that his Alzheimer's dementia is his largest problem and likely going to limit his life expectancy. We talked about the rigors of screening colonoscopy. Both [name] and his sister agree that he would prefer to forego future colon cancer screening which I think is fine based on less than 10 year life expectancy.   We did provide the second component of pneumococcal vaccination today. And I prescribed the first component of the Shingrix series to be done at [facility].

## 2016-04-27 NOTE — Telephone Encounter (Signed)
Needs to speak with a nurse. Please call back.  

## 2016-04-27 NOTE — Patient Instructions (Signed)
Varicella-Zoster Immune Globulin (Human) injection solution What is this medicine? VARICELLA-ZOSTER IMMUNE GLOBULIN (var uh SEL uh - ZOS ter i MYOON GLOB yoo lin) helps to reduce the severity of chickenpox infections in patients who are at risk. This medicine is collected from the pooled blood of many donors. This medicine may be used for other purposes; ask your health care provider or pharmacist if you have questions. COMMON BRAND NAME(S): VARIZIG What should I tell my health care provider before I take this medicine? They need to know if you have any of these conditions: -bleeding disorder -diabetes -heart disease -high cholesterol -history of blood clots -IgA deficiency -low blood counts, like low white cell, platelet, or red cell counts -recently received or scheduled to receive a vaccine -an unusual or allergic reaction to varicella-zoster immune globulin, other immune globulins, other medicines, foods, dyes, or preservatives -pregnant or trying to get pregnant -breast-feeding How should I use this medicine? This medicine is for injection into a muscle. It is usually given by a health care professional in a hospital or clinic setting. Talk to your pediatrician regarding the use of this medicine in children. While this drug may be prescribed for newborns for selected conditions, precautions do apply. Overdosage: If you think you have taken too much of this medicine contact a poison control center or emergency room at once. NOTE: This medicine is only for you. Do not share this medicine with others. What if I miss a dose? This does not apply. What may interact with this medicine? -live virus vaccines This list may not describe all possible interactions. Give your health care provider a list of all the medicines, herbs, non-prescription drugs, or dietary supplements you use. Also tell them if you smoke, drink alcohol, or use illegal drugs. Some items may interact with your  medicine. What should I watch for while using this medicine? This medicine is made from human blood. It may be possible to pass an infection in this medicine, but no cases have been reported. Talk to your doctor about the risks and benefits of this medicine. This medicine can decrease the response to a vaccine. If you need to get vaccinated, tell your healthcare professional if you have received this medicine within the last 3 months. Extra booster doses may be needed. Talk to your doctor to see if a different vaccination schedule is needed. Your condition will be monitored carefully while you are receiving this medicine. What side effects may I notice from receiving this medicine? Side effects that you should report to your doctor or health care professional as soon as possible: -allergic reactions like skin rash, itching or hives, swelling of the face, lips, or tongue -shortness of breath, chest pain, swelling in a leg Side effects that usually do not require medical attention (report to your doctor or health care professional if they continue or are bothersome): -chills -headache -pain, redness, or irritation at site where injected This list may not describe all possible side effects. Call your doctor for medical advice about side effects. You may report side effects to FDA at 1-800-FDA-1088. Where should I keep my medicine? This drug is given in a hospital or clinic and will not be stored at home. NOTE: This sheet is a summary. It may not cover all possible information. If you have questions about this medicine, talk to your doctor, pharmacist, or health care provider.  2018 Elsevier/Gold Standard (2015-02-21 09:27:53)

## 2016-04-27 NOTE — Telephone Encounter (Signed)
Reassured about pt's upcoming trip to [place] and going into memory care, reassured and she was good

## 2016-04-27 NOTE — Assessment & Plan Note (Signed)
Symptomatically stable, no emotional outbursts. At our last visit we increased his duloxetine which she has tolerated well. Plan to continue duloxetine at 60 mg daily.

## 2016-04-27 NOTE — Assessment & Plan Note (Signed)
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 He is tolerating therapy well with no bleeding or other side effects. Renal function is stable. Plan to continue xarelto 20 mg daily indefinitely.

## 2016-04-27 NOTE — Progress Notes (Signed)
Assessment and Plan:  See Encounters tab for problem-based medical decision making.   __________________________________________________________  HPI:  66 year old man here for follow-up of Alzheimer's dementia. He is accompanied by his sister [name]) health take care of him during the day. His dementia has been progressing over the last few years and is now at least moderate. He needs help with most of his activities of daily living. His sister manages his medications for him. He has a son who is also involved in lives in [place]. Client hasn't had no recent illnesses, no fevers or chills, no hospitalizations. No behavioral disturbances. He has no acute complaints today. Tolerating his medications well with no side effects.  __________________________________________________________  Problem List: Patient Active Problem List   Diagnosis Date Noted  . Hypercoagulable state ([place]) 01/20/2016    Priority: High  . Alzheimer's dementia 12/25/2013    Priority: High  . Osteoarthritis of knees, bilateral 07/13/2011    Priority: Medium  . Generalized anxiety disorder 03/02/2011    Priority: Medium  . Chronic venous insufficiency 06/04/2015    Priority: Low  . Dyslipidemia 06/13/2013    Priority: Low  . Preventative health care 12/09/2012    Priority: Low  . GERD (gastroesophageal reflux disease) 07/13/2011    Priority: Low    Medications: Reconciled today in Epic __________________________________________________________  Physical Exam:  Vital Signs: Vitals:   04/27/16 1032  BP: 128/74  Pulse: 67  Temp: 98 F (36.7 C)  TempSrc: Oral  SpO2: 98%  Weight: 177 lb 4.8 oz (80.4 kg)    Gen: Well appearing, NAD ENT: OP clear without erythema or exudate. Poor dentition. CV: RRR, no murmurs Pulm: Normal effort, CTA throughout, no wheezing Abd: Soft, NT, ND, normal BS.  Ext: Warm, no edema, normal joints Skin: No atypical appearing moles. No rashes

## 2016-04-27 NOTE — Assessment & Plan Note (Signed)
Symptomatically stable with at least moderate disease at this point. He has excellent social support, spends the day with his sister [name]) who takes excellent care of him. He is by himself in his apartment at night no recent safety concerns. He and his sons have thought about a memory unit in the past, but [name] would prefer to stay in his apartment for as long as possible. Plan to continue Donepezil 10 mg daily, and they have reasonable expectations about this strategy.

## 2016-04-28 NOTE — Addendum Note (Signed)
Addended by: Marcelino Duster on: 04/28/2016 05:03 PM   Modules accepted: Orders

## 2016-06-09 ENCOUNTER — Other Ambulatory Visit: Payer: Self-pay | Admitting: Student in an Organized Health Care Education/Training Program

## 2016-07-13 ENCOUNTER — Other Ambulatory Visit: Payer: Self-pay | Admitting: Student in an Organized Health Care Education/Training Program

## 2016-07-27 ENCOUNTER — Ambulatory Visit (INDEPENDENT_AMBULATORY_CARE_PROVIDER_SITE_OTHER): Payer: Medicare HMO | Admitting: Student in an Organized Health Care Education/Training Program

## 2016-07-27 ENCOUNTER — Encounter: Payer: Self-pay | Admitting: Student in an Organized Health Care Education/Training Program

## 2016-07-27 ENCOUNTER — Ambulatory Visit (HOSPITAL_COMMUNITY)
Admission: RE | Admit: 2016-07-27 | Discharge: 2016-07-27 | Disposition: A | Discharge status: Home / Self Care | Payer: Medicare HMO | Source: Ambulatory Visit | Attending: Student in an Organized Health Care Education/Training Program | Admitting: Student in an Organized Health Care Education/Training Program

## 2016-07-27 VITALS — BP 110/69 | HR 50 | Temp 97.8°F | Wt 168.2 lb

## 2016-07-27 DIAGNOSIS — F0280 Dementia in other diseases classified elsewhere without behavioral disturbance: Secondary | ICD-10-CM

## 2016-07-27 DIAGNOSIS — G300 Alzheimer's disease with early onset: Secondary | ICD-10-CM | POA: Diagnosis not present

## 2016-07-27 DIAGNOSIS — Z7901 Long term (current) use of anticoagulants: Secondary | ICD-10-CM | POA: Diagnosis not present

## 2016-07-27 DIAGNOSIS — R634 Abnormal weight loss: Secondary | ICD-10-CM | POA: Insufficient documentation

## 2016-07-27 DIAGNOSIS — D6859 Other primary thrombophilia: Secondary | ICD-10-CM

## 2016-07-27 DIAGNOSIS — Z6824 Body mass index (BMI) 24.0-24.9, adult: Secondary | ICD-10-CM | POA: Diagnosis not present

## 2016-07-27 DIAGNOSIS — R001 Bradycardia, unspecified: Secondary | ICD-10-CM | POA: Insufficient documentation

## 2016-07-27 DIAGNOSIS — G309 Alzheimer's disease, unspecified: Secondary | ICD-10-CM

## 2016-07-27 DIAGNOSIS — M170 Bilateral primary osteoarthritis of knee: Secondary | ICD-10-CM

## 2016-07-27 NOTE — Progress Notes (Signed)
Assessment and Plan:  See Encounters tab for problem-based medical decision making.   __________________________________________________________  HPI:  66 year old man here for follow-up of progressive early age onset moderate Alzheimer's dementia. He is accompanied today by his sister [name]). They report that he is doing okay at home. If everything is stable. Continues to spend his day with his sister, and then goes back to his apartment at nights and is by himself. Recently spent a couple weeks with his son in [place]. Patient has no acute complaints, denies any pain, reports good exertional capacity. He still likes to mow the lawn and do chores around the house. The try to arrange for him to go to a dentist so he can have his lower teeth pulled and fitted for dentures. He's got numerous cavities in his teeth. He denies that they bother him. Family is also worried about his left knee which has a little significant amount of arthritis and he seems to limp on it. However he denies any pain. Says it doesn't keep him from doing anything. Therefore good compliance with his medications, denied any recent side effects. They're currently in the process of transitioning the patient into a memory unit. They are on a waiting list for a facility on [employer]. The patient's daughter-in-law is also interested in potentially placing him in an assisted living facility if they have enough supervision and support. It sounds like they are working through this process well on her own.  __________________________________________________________  Problem List: Patient Active Problem List   Diagnosis Date Noted  . Weight loss 07/27/2016    Priority: High  . Hypercoagulable state ([place]) 01/20/2016    Priority: High  . Alzheimer's dementia 12/25/2013    Priority: High  . Sinus bradycardia 07/27/2016    Priority: Medium  . Osteoarthritis of knees, bilateral 07/13/2011    Priority: Medium  . Generalized anxiety  disorder 03/02/2011    Priority: Medium  . Chronic venous insufficiency 06/04/2015    Priority: Low  . Dyslipidemia 06/13/2013    Priority: Low  . Preventative health care 12/09/2012    Priority: Low  . GERD (gastroesophageal reflux disease) 07/13/2011    Priority: Low    Medications: Reconciled today in Epic __________________________________________________________  Physical Exam:  Vital Signs: Vitals:   07/27/16 1025  BP: 110/69  Pulse: (!) 50  Temp: 97.8 F (36.6 C)  TempSrc: Oral  SpO2: 100%  Weight: 168 lb 3.2 oz (76.3 kg)    Gen: Well appearing, NAD ENT: poor dentition on the bottom with multiple carries Neck: No cervical LAD, No thyromegaly or nodules, No JVD. CV: RRR, no murmurs Pulm: Normal effort, CTA throughout, no wheezing Abd: Soft, NT, ND, normal BS.  Ext: Warm, no edema, left knee with severe anterior crepitus, small effusion, hypertrophic bone changes. Skin: No atypical appearing moles. No rashes, multiple SKs on this arms, no apparent BCCs on head or ears.

## 2016-07-27 NOTE — Assessment & Plan Note (Signed)
Asymptomatic. ECG today consistent with sinus bradycardia with rate of 46 bpm. No heart block. No common nodal blocking medications. Donepizil can cause bradycardia in <1%, but I don't want to stop this medication right now, especially because he is asymptomatic. We will watch for symptoms, and may have to think about stopping the aricept in the future if this continues.

## 2016-07-27 NOTE — Assessment & Plan Note (Signed)
Symptomatically stable. Patient has at least moderate Alzheimer's dementia with last Mini-Mental Status exam score of 18 out of 30. He continues to spend the day with his sister Jordan Long) and then is by himself in an apartment in the evening. No recent safety concerns. I spoke by phone with his daughter-in-law Jordan Long) today. They're in the process of transitioning him to either a memory unit or an assisted living facility with supervision. I was encouraging of this transition. Will continue with donepezil 10mg  daily.

## 2016-07-27 NOTE — Assessment & Plan Note (Signed)
Severe OA changes of the right knee, especially the patello-femoral joint. But relatively asymptomatic and good functional status. We will watch for symptoms, continue with as needed NSAIDs for mild flares.

## 2016-07-27 NOTE — Assessment & Plan Note (Signed)
22 pound unintentional weight loss over the last 6 months. BMI now 24.8. Patient denies any change in appetite. No nausea or vomiting, no abdominal pain. Unclear etiology, doubtful it is a medication side effect. Plan to check CMP, CBC, and TSH today. No signs on exam or in history to point to malignancy. This may represent progression of dementia. Follow up weight in three months.

## 2016-07-27 NOTE — Patient Instructions (Signed)
1. I agree with seeing a dentist to discuss dentures. If the dentist wants to pull the bottom teeth, Jordan Long may stop taking Xarelto 2 days prior to the procedure, and restart Xarelto the day after the procedure.   2. I am checking blood work for the weight loss. This will give [place] an idea about his nutrition and organ function.   3. I talked with Jordan Long today and answered her questions.   4. I am in agreement with moving Jordan Long to either memory care or an assisted living facility that has a significant amount of support and supervision especially at night.

## 2016-07-27 NOTE — Assessment & Plan Note (Signed)
Recurrent unprovoked DVT and PE doing well on life long anticoagulation with rivaroxaban. No significant bleeding or bruising. Checking renal function today. May have bottom teeth pulled soon, I advised he can hold rivaroxaban 2 days prior to procedure, and resume the day after.

## 2016-07-28 ENCOUNTER — Encounter: Payer: Self-pay | Admitting: Student in an Organized Health Care Education/Training Program

## 2016-07-28 LAB — CMP14 + ANION GAP
ALBUMIN: 3.8 g/dL (ref 3.6–4.8)
ALT: 16 IU/L (ref 0–44)
AST: 22 IU/L (ref 0–40)
Albumin/Globulin Ratio: 1.3 (ref 1.2–2.2)
Alkaline Phosphatase: 74 IU/L (ref 39–117)
Anion Gap: 15 mmol/L (ref 10.0–18.0)
BILIRUBIN TOTAL: 0.3 mg/dL (ref 0.0–1.2)
BUN / CREAT RATIO: 23 (ref 10–24)
BUN: 25 mg/dL (ref 8–27)
CALCIUM: 9.2 mg/dL (ref 8.6–10.2)
CHLORIDE: 105 mmol/L (ref 96–106)
CO2: 22 mmol/L (ref 20–29)
Creatinine, Ser: 1.11 mg/dL (ref 0.76–1.27)
GFR, EST AFRICAN AMERICAN: 79 mL/min/{1.73_m2} (ref >59)
GFR, EST NON AFRICAN AMERICAN: 68 mL/min/{1.73_m2} (ref >59)
Globulin, Total: 2.9 g/dL (ref 1.5–4.5)
Glucose: 77 mg/dL (ref 65–99)
POTASSIUM: 4.4 mmol/L (ref 3.5–5.2)
Sodium: 142 mmol/L (ref 134–144)
TOTAL PROTEIN: 6.7 g/dL (ref 6.0–8.5)

## 2016-07-28 LAB — TSH: TSH: 6.42 u[IU]/mL — ABNORMAL HIGH (ref 0.450–4.500)

## 2016-07-28 LAB — CBC
HEMOGLOBIN: 13.4 g/dL (ref 13.0–17.7)
Hematocrit: 41.5 % (ref 37.5–51.0)
MCH: 29 pg (ref 26.6–33.0)
MCHC: 32.3 g/dL (ref 31.5–35.7)
MCV: 90 fL (ref 79–97)
Platelets: 264 10*3/uL (ref 150–379)
RBC: 4.62 x10E6/uL (ref 4.14–5.80)
RDW: 13.9 % (ref 12.3–15.4)
WBC: 6.5 10*3/uL (ref 3.4–10.8)

## 2016-07-30 ENCOUNTER — Telehealth: Payer: Self-pay

## 2016-07-30 NOTE — Telephone Encounter (Signed)
Requesting lab results. Please call back.  

## 2016-07-31 NOTE — Telephone Encounter (Signed)
I called and spoke with [name]'s daughter about the results.

## 2016-08-06 ENCOUNTER — Telehealth: Payer: Self-pay | Admitting: *Deleted

## 2016-08-06 NOTE — Telephone Encounter (Signed)
For Xarelto it is also ok to hold one dose today and resume tomorrow. Thanks.

## 2016-08-06 NOTE — Telephone Encounter (Signed)
Pt's sister/main caregiver calls and states pt took wed and thurs meds wed 7/4, she called the pharmacist and it was stated to skip dosing today but there is a concern for xarelto, should pt take it today or wait til tomorrow to resume med schedule? Please advise

## 2016-08-06 NOTE — Telephone Encounter (Signed)
Pt's sister informed.

## 2016-08-07 ENCOUNTER — Telehealth: Payer: Self-pay

## 2016-08-07 NOTE — Telephone Encounter (Signed)
Pt is going to have dental extractions done Monday 7/9 and needs to know when to stop and restart xarelto, please advise

## 2016-08-07 NOTE — Telephone Encounter (Signed)
Needs to speak with a nurse about XARELTO 20 MG TABS tablet. Please call back

## 2016-08-09 ENCOUNTER — Other Ambulatory Visit: Payer: Self-pay | Admitting: Student in an Organized Health Care Education/Training Program

## 2016-08-10 ENCOUNTER — Telehealth: Payer: Self-pay

## 2016-08-10 NOTE — Telephone Encounter (Signed)
Needs to speak with a nurse about med. Please call back.  

## 2016-08-10 NOTE — Telephone Encounter (Signed)
Pt's sister and primary caregiver calls and has a question for dr Evette Doffing, does he think that part of pt's weight loss could be r/t his thyroid or just completely to his alzheimer's? Does he possibly need to be on medication? Please advise

## 2016-08-10 NOTE — Telephone Encounter (Signed)
I am sorry I missed this Friday Afternoon. I am not sure if he received a response in time for the extraction scheduled for today. Ideally in the future, he can stop the xarelto 2 days prior to the procedure, and then restart the day after the procedure.

## 2016-08-12 NOTE — Telephone Encounter (Signed)
Please tell [name] that I doubt the weight loss is coming from a thyroid problem. The lab is just a little off, I want to recheck it at our next visit. I do not think he needs thyroid medicine right now. Please ask her to schedule an appointment with me for sometime in August. I should have spots available. Thanks.

## 2016-08-15 ENCOUNTER — Other Ambulatory Visit: Payer: Self-pay | Admitting: Student in an Organized Health Care Education/Training Program

## 2016-08-19 NOTE — Telephone Encounter (Signed)
Had been increased to  60 in June

## 2016-08-19 NOTE — Telephone Encounter (Signed)
Called sue, gave her the message, scheduled appt in aug

## 2016-08-31 ENCOUNTER — Ambulatory Visit (INDEPENDENT_AMBULATORY_CARE_PROVIDER_SITE_OTHER): Payer: Medicare HMO | Admitting: Internal Medicine

## 2016-08-31 ENCOUNTER — Encounter: Payer: Self-pay | Admitting: Internal Medicine

## 2016-08-31 DIAGNOSIS — R197 Diarrhea, unspecified: Secondary | ICD-10-CM | POA: Diagnosis not present

## 2016-08-31 DIAGNOSIS — Z87891 Personal history of nicotine dependence: Secondary | ICD-10-CM

## 2016-08-31 DIAGNOSIS — A498 Other bacterial infections of unspecified site: Secondary | ICD-10-CM | POA: Insufficient documentation

## 2016-08-31 NOTE — Progress Notes (Signed)
   CC: acute diarrhea  HPI:  Jordan Long. is a 66 y.o. with PMH as listed below who presents for acute concern of diarrhea. Please see the assessment and plans for the status of the patient chronic medical problems.    Past Medical History:  Diagnosis Date  . Constipation 11/20/2010  . DVT (deep venous thrombosis) ([place]) 2010 and 10/2010   after Hernia repair in 2010 s/p IVC filter and coumadin. Next episode after Hernia repair  ( 10/23/2010) . Admitted on 11/02/2010 for repeat DVT. Warfarin was stopped for  5 days before surgery.   Marland Kitchen DVT (deep venous thrombosis) ([place]) 10/2011   right leg after knee surgery  . GERD (gastroesophageal reflux disease)   . Incisional hernia 08/20/2010  . Pulmonary embolism ([place]) 2010   Review of Systems:  Refer to history of present illness and assessment and plans for pertinent review of systems, all others reviewed and negative  Physical Exam:  Vitals:   08/31/16 1603  BP: 105/63  Pulse: 71  Temp: 98.1 F (36.7 C)  TempSrc: Oral  SpO2: 99%  Weight: 168 lb 4.8 oz (76.3 kg)   Physical Exam  Constitutional: He is well-developed, well-nourished, and in no distress. No distress.  Cardiovascular: Normal rate and regular rhythm.   No murmur heard. Abdominal:  Hyperactive bowel sounds, no tenderness to palpation   Skin: He is not diaphoretic.    Assessment & Plan:   Acute diarrhea  Patient presents with acute diarrhea which began 3 days ago. One week prior to the onset of his diarrhea he had taken a 10 day course of amoxicillin for a dental procedure. He had no diarrhea during the antibiotic course. On Saturday he had 3 watery bowel movements, on Sunday he had 4 and last night he began having watery diarrhea and has not stopped having bowel movements since then. The most recent bowel movements have had some blood in them. He denies abdominal upset or nausea. He has had less of an appetite but has managed to eat some and take in fluids.  He denies fever, chills, known sick contacts, or dizziness. He took imodium about two hours ago and has not had a bowel movement since then. Given the time course I am concerned for acute C.diff infection.  - Hold donepizil for the next week until diarrheal symptoms resolve  - ordered C. Diff toxin testing, he will collect a stool sample at home  - recommended BRAT diet  - Follow up in 1 week   See Encounters Tab for problem based charting.  Patient discussed with Dr. Evette Doffing

## 2016-08-31 NOTE — Patient Instructions (Addendum)
Mr. Farias,   Someone from the clinic will call you with the results of your lab test.  Please make sure that you stay well hydrated.   Hold off on taking the donepezil until you see Dr. Evette Doffing  Your follow up appointment is scheduled with Dr. Evette Doffing on August 6th

## 2016-08-31 NOTE — Assessment & Plan Note (Signed)
Patient presents with acute diarrhea which began 3 days ago. One week prior to the onset of his diarrhea he had taken a 10 day course of amoxicillin for a dental procedure. He had no diarrhea during the antibiotic course. On Saturday he had 3 watery bowel movements, on Sunday he had 4 and last night he began having watery diarrhea and has not stopped having bowel movements since then. The most recent bowel movements have had some blood in them. He denies abdominal upset or nausea. He has had less of an appetite but has managed to eat some and take in fluids. He denies fever, chills, known sick contacts, or dizziness. He took imodium about two hours ago and has not had a bowel movement since then. Given the time course I am concerned for acute C.diff infection.  - Hold donepizil for the next week until diarrheal symptoms resolve  - ordered C. Diff toxin testing, he will collect a stool sample at home  - recommended BRAT diet  - Follow up in 1 week

## 2016-09-01 ENCOUNTER — Other Ambulatory Visit: Payer: Medicare HMO

## 2016-09-01 DIAGNOSIS — R197 Diarrhea, unspecified: Secondary | ICD-10-CM | POA: Diagnosis not present

## 2016-09-01 NOTE — Progress Notes (Signed)
Internal Medicine Clinic Attending  I saw and evaluated the patient.  I personally confirmed the key portions of the history and exam documented by Dr. Blum and I reviewed pertinent patient test results.  The assessment, diagnosis, and plan were formulated together and I agree with the documentation in the resident's note. 

## 2016-09-03 ENCOUNTER — Telehealth: Payer: Self-pay | Admitting: Student in an Organized Health Care Education/Training Program

## 2016-09-03 ENCOUNTER — Telehealth: Payer: Self-pay | Admitting: *Deleted

## 2016-09-03 LAB — CLOSTRIDIUM DIFFICILE EIA: C DIFFICILE TOXINS A+ B, EIA: POSITIVE — AB

## 2016-09-03 MED ORDER — VANCOMYCIN HCL 125 MG PO CAPS: 125.0000 mg | ORAL_CAPSULE | Freq: Four times a day (QID) | ORAL | 0 refills | Status: DC

## 2016-09-03 MED ORDER — VANCOMYCIN HCL 125 MG PO CAPS: 125.0000 mg | ORAL_CAPSULE | Freq: Four times a day (QID) | ORAL | 0 refills | Status: AC

## 2016-09-03 NOTE — Addendum Note (Signed)
Addended by: Lalla Brothers T on: 09/03/2016 10:27 AM   Modules accepted: Orders

## 2016-09-03 NOTE — Telephone Encounter (Signed)
Thank you. I spoke with Blanch Media and answered her questions.

## 2016-09-03 NOTE — Telephone Encounter (Signed)
Pt's caregiver and sister call to ask what precautions to use when pt returns this week, she is reminded of good handwashing for pt and all others caring for him, to clean toilet facilities well, and caring for soiled clothing. She has already started using good hand washing and disinfecting. Just an [place]

## 2016-09-03 NOTE — Telephone Encounter (Signed)
Daughter in law calls and ask should pt continue his medications while taking the abx specifically the xarelto? Are there food s he should or shouldn't eat? What precautions should she take? She would feel better speaking directly to you

## 2016-09-03 NOTE — Telephone Encounter (Signed)
HIS WIFE JOYCE CALLED AND WOULD LIKE DR. Newport [phone]

## 2016-09-03 NOTE — Telephone Encounter (Signed)
Jordan Long called me back and I gave her the details about C Diff. [name] is currently in [place] with his son, I am sending the vanc there so he can start today.

## 2016-09-03 NOTE — Telephone Encounter (Signed)
I tried to call patient with positive C Diff results, no answer, so I left a VM. This is first occurrence of non-severe CDI that was due to Amoxicillin after dental extraction earlier in the month. I have sent Rx of Vancomycin 125mg  PO QID x 10 days to [facility] which he should start today. I left my return phone for the patient to get back to me.

## 2016-09-04 NOTE — Telephone Encounter (Signed)
Dr Evette Doffing acknowledge noting call

## 2016-09-05 ENCOUNTER — Telehealth: Payer: Self-pay | Admitting: Internal Medicine

## 2016-09-05 NOTE — Telephone Encounter (Signed)
   Reason for call:   I received a call from Mr. [name] [place]. at 1:00 PM indicating that he was recently diagnosed with C.Diff and started on vancomycin. Feeling weak with nausea without any emesis. No diarrhea today. Taking in some PO intake. Sister is concerned due to him feeling so poorly. Reports BP is 115/73 with a pulse of 53. No fevers or chills.   Pertinent Data:   C.Diff on Vanc, started on 8/2  Nausea without emesis  Some PO intake but limited  No diarrhea currently  Afebrile   Assessment / Plan / Recommendations:   Encouraged PO intake. Has phenergan available for nausea. If unable to tolerate PO intake would recommend being evaluated in the ED for possible dehydration due to recent large volume diarrhea. Sister voices understanding.   As always, pt is advised that if symptoms worsen or new symptoms arise, they should go to an urgent care facility or to to ER for further evaluation.   Maryellen Pile, MD   09/05/2016, 1:07 PM

## 2016-09-07 ENCOUNTER — Ambulatory Visit (INDEPENDENT_AMBULATORY_CARE_PROVIDER_SITE_OTHER): Payer: Medicare HMO | Admitting: Student in an Organized Health Care Education/Training Program

## 2016-09-07 ENCOUNTER — Encounter: Payer: Self-pay | Admitting: Student in an Organized Health Care Education/Training Program

## 2016-09-07 VITALS — BP 103/69 | HR 59 | Temp 99.7°F | Wt 167.2 lb

## 2016-09-07 DIAGNOSIS — A0472 Enterocolitis due to Clostridium difficile, not specified as recurrent: Secondary | ICD-10-CM | POA: Diagnosis not present

## 2016-09-07 DIAGNOSIS — B9689 Other specified bacterial agents as the cause of diseases classified elsewhere: Secondary | ICD-10-CM | POA: Diagnosis not present

## 2016-09-07 DIAGNOSIS — Z87891 Personal history of nicotine dependence: Secondary | ICD-10-CM

## 2016-09-07 DIAGNOSIS — R946 Abnormal results of thyroid function studies: Secondary | ICD-10-CM | POA: Diagnosis not present

## 2016-09-07 DIAGNOSIS — Z8349 Family history of other endocrine, nutritional and metabolic diseases: Secondary | ICD-10-CM | POA: Diagnosis not present

## 2016-09-07 DIAGNOSIS — F0280 Dementia in other diseases classified elsewhere without behavioral disturbance: Secondary | ICD-10-CM | POA: Diagnosis not present

## 2016-09-07 DIAGNOSIS — K219 Gastro-esophageal reflux disease without esophagitis: Secondary | ICD-10-CM

## 2016-09-07 DIAGNOSIS — A498 Other bacterial infections of unspecified site: Secondary | ICD-10-CM

## 2016-09-07 DIAGNOSIS — Z818 Family history of other mental and behavioral disorders: Secondary | ICD-10-CM

## 2016-09-07 DIAGNOSIS — G309 Alzheimer's disease, unspecified: Secondary | ICD-10-CM

## 2016-09-07 DIAGNOSIS — R7989 Other specified abnormal findings of blood chemistry: Secondary | ICD-10-CM | POA: Insufficient documentation

## 2016-09-07 NOTE — Patient Instructions (Addendum)
1. Continue taking vancomycin for the full 10 days, even if the diarrhea resolves.   2. Call me if you have any worsening abdominal pain, nausea, vomiting, or light headedness.   3. Continue your other medications as prescribed.

## 2016-09-07 NOTE — Assessment & Plan Note (Signed)
We diagnosed patient with a C. difficile infection after started having profuse diarrhea about one week ago. He is tolerating the oral vancomycin tablets well and reports that the diarrhea is improving. He does have a history of abdominal surgeries after a complicated hernia repair with ileus. Asked the patient to stay in town for least the next week while he continues to recover. He was having some lightheadedness this weekend after doing yard work, plan to check a BMP today to look for aki or electrolyte abnormalities.

## 2016-09-07 NOTE — Progress Notes (Signed)
   Assessment and Plan:  See Encounters tab for problem-based medical decision making.   __________________________________________________________  HPI:   66 year old man with advanced Alzheimer's dementia presents for follow-up of C. difficile infection. In early July the patient had some dental work and received a course of amoxicillin after that procedure. About 2 weeks after he finished the antibiotic he started to have increasing diarrhea with moderate abdominal pain and cramps. He came to the clinic last week, we checked a C. difficile antigen on his stool which returned positive. I spoke to his family over the phone and we called in vancomycin which she was able to start on Thursday. He is now 4 days into the treatment course now and feeling relatively well. He reports the diarrhea is less in frequency. Denies abdominal pain or cramping. Eating and drinking well. He did have lightheadedness yesterday, but I was after he insisted on mowing his sister's yard. He denies any fevers or chills at home. No chest pain or dyspnea on exertion. His dementia is stable, functional status is stable, he continues to live by himself at night for spends his day with his sister in [place]. His children live in [place] and he visits them frequently. They are working on placing him in a memory care unit sometime in the next few months.  __________________________________________________________  Problem List: Patient Active Problem List   Diagnosis Date Noted  . Weight loss 07/27/2016    Priority: High  . Hypercoagulable state ([place]) 01/20/2016    Priority: High  . Alzheimer's dementia 12/25/2013    Priority: High  . Osteoarthritis of knees, bilateral 07/13/2011    Priority: Medium  . Generalized anxiety disorder 03/02/2011    Priority: Medium  . Chronic venous insufficiency 06/04/2015    Priority: Low  . Dyslipidemia 06/13/2013    Priority: Low  . Preventative health care 12/09/2012   Priority: Low  . GERD (gastroesophageal reflux disease) 07/13/2011    Priority: Low  . Elevated TSH 09/07/2016  . Clostridium difficile infection 08/31/2016    Medications: Reconciled today in Epic __________________________________________________________  Physical Exam:  Vital Signs: Vitals:   09/07/16 1132  BP: 103/69  Pulse: (!) 59  Temp: 99.7 F (37.6 C)  TempSrc: Oral  SpO2: 100%  Weight: 167 lb 3.2 oz (75.8 kg)    Gen: Well appearing, NAD ENT: OP clear without erythema or exudate, poor dentition. Neck: No cervical LAD, No thyromegaly or nodules, No JVD. CV: RRR, no murmurs Pulm: Normal effort, CTA throughout, no wheezing Abd: Soft, mild TTP throughout, ND, normal BS, numerous well healed surgical scars. Ext: Warm, no edema.

## 2016-09-07 NOTE — Assessment & Plan Note (Signed)
Patient with long-standing reflux type symptoms. He uses as needed Tums frequently and Prilosec on a daily basis. Developed a C. difficile infection in early August after a course of amoxicillin for tooth infection. We talked about the small possible increased risk for C. difficile infections with PPI class therapy. Patient and his sister feel like he still getting great benefit out of this medication, so plan is to continue with PPI therapy for now.

## 2016-09-07 NOTE — Assessment & Plan Note (Signed)
On blood work at her last visit for unintentional weight loss we noticed an elevated TSH to around 6. Patient is not showing any other signs of hypothyroidism. Plan is to repeat a TSH today with a free T4.

## 2016-09-08 ENCOUNTER — Telehealth: Payer: Self-pay | Admitting: Student in an Organized Health Care Education/Training Program

## 2016-09-08 LAB — BMP8+ANION GAP
ANION GAP: 12 mmol/L (ref 10.0–18.0)
BUN/Creatinine Ratio: 11 (ref 10–24)
BUN: 12 mg/dL (ref 8–27)
CO2: 26 mmol/L (ref 20–29)
CREATININE: 1.09 mg/dL (ref 0.76–1.27)
Calcium: 9 mg/dL (ref 8.6–10.2)
Chloride: 103 mmol/L (ref 96–106)
GFR calc Af Amer: 81 mL/min/{1.73_m2} (ref >59)
GFR calc non Af Amer: 70 mL/min/{1.73_m2} (ref >59)
Glucose: 76 mg/dL (ref 65–99)
POTASSIUM: 4.1 mmol/L (ref 3.5–5.2)
SODIUM: 141 mmol/L (ref 134–144)

## 2016-09-08 LAB — T4, FREE: FREE T4: 1.05 ng/dL (ref 0.82–1.77)

## 2016-09-08 LAB — TSH: TSH: 3.46 u[IU]/mL (ref 0.450–4.500)

## 2016-09-08 NOTE — Telephone Encounter (Signed)
I called and told Jordan Long that his BMP and thyroid studies were normal. No signs of dehydration or electrolyte abnormality from the C Diff infection. I am hopeful he will return to normal towards the end of this week.

## 2016-10-26 ENCOUNTER — Ambulatory Visit (INDEPENDENT_AMBULATORY_CARE_PROVIDER_SITE_OTHER): Payer: Medicare HMO | Admitting: Internal Medicine

## 2016-10-26 ENCOUNTER — Encounter (INDEPENDENT_AMBULATORY_CARE_PROVIDER_SITE_OTHER): Payer: Self-pay

## 2016-10-26 ENCOUNTER — Telehealth: Payer: Self-pay | Admitting: *Deleted

## 2016-10-26 ENCOUNTER — Encounter: Payer: Self-pay | Admitting: Internal Medicine

## 2016-10-26 DIAGNOSIS — Z0000 Encounter for general adult medical examination without abnormal findings: Secondary | ICD-10-CM

## 2016-10-26 DIAGNOSIS — H1132 Conjunctival hemorrhage, left eye: Secondary | ICD-10-CM

## 2016-10-26 DIAGNOSIS — H579 Unspecified disorder of eye and adnexa: Secondary | ICD-10-CM

## 2016-10-26 DIAGNOSIS — H02402 Unspecified ptosis of left eyelid: Secondary | ICD-10-CM | POA: Diagnosis not present

## 2016-10-26 NOTE — Progress Notes (Signed)
   CC: left eye irritation  HPI:  [name] is a 66 y.o. with past medical history as documented below presenting with a chief complaint of left eye irritation for about two days. Per patient's sister she noticed he was rubbing his eye more frequently, was complaining of blurry vision, and she thought his left upper lid was more droopy than normal. He has no eye pain, denies trauma, or foreign body sensation. The patient does not wear contact lenses. Denies drainage or excessive tearing from the left eye. The blurriness is intermittent and not constant. Denies fevers or chills or recent infection.    Past Medical History:  Diagnosis Date  . Constipation 11/20/2010  . DVT (deep venous thrombosis) ([place]) 2010 and 10/2010   after Hernia repair in 2010 s/p IVC filter and coumadin. Next episode after Hernia repair  ( 10/23/2010) . Admitted on 11/02/2010 for repeat DVT. Warfarin was stopped for  5 days before surgery.   Marland Kitchen DVT (deep venous thrombosis) ([place]) 10/2011   right leg after knee surgery  . GERD (gastroesophageal reflux disease)   . Incisional hernia 08/20/2010  . Pulmonary embolism ([place]) 2010   Review of Systems:   Review of Systems  Constitutional: Negative for chills and fever.  Eyes: Positive for blurred vision and redness. Negative for double vision, photophobia, pain and discharge.  Neurological: Negative for focal weakness.    Physical Exam:  Vitals:   10/26/16 1623  BP: (!) 146/74  Pulse: (!) 57  Temp: 97.6 F (36.4 C)  TempSrc: Oral  SpO2: 100%  Weight: 166 lb 14.4 oz (75.7 kg)  Height: 5\' 8"  (1.727 m)   Physical Exam  Constitutional: He appears well-developed and well-nourished.  HENT:  Head: Normocephalic and atraumatic.  Eyes: Pupils are equal, round, and reactive to light. EOM are normal. Left eye exhibits no chemosis, no discharge, no exudate and no hordeolum. No foreign body present in the left eye. Left conjunctiva has a hemorrhage (Inferior  globe, and superior lateral ).  Left lid:  +ptosis, lower lid everted, no foreign body seen Upper lid: unable to evert completely but no foreign body visualized with partial eversion Patients vision was WNL  No pain with EOM bilaterally   Cardiovascular: Normal rate and regular rhythm.   Pulmonary/Chest: Effort normal and breath sounds normal.  Skin: Skin is warm and dry.    Assessment & Plan:   See Encounters Tab for problem based charting.  Patient seen with Dr. Evette Doffing

## 2016-10-26 NOTE — Assessment & Plan Note (Signed)
Patient most likely has conjunctival irritation due to a transient foreign body. No signs of acute conjunctivitis on physical exam such as conjunctival injection or purulent drainage, subconjunctival hemorrhage noted inferiorally and superior laterally with lower and upper lid retraction. Less concerned for corneal abrasion because the patient denies pain or trauma to the eye. Grossly the cornea looks intact with no clouding, but unable to truly assess the integrity of cornea without SL exam. Allergic conjunctivitis less likely because his irritation is unilateral and he denies itching or watery eyes. Vision is stable and EOM are intact making optic nerve or central process unlikely.   Plan: -If symptoms do not improve Ophthalmology referral for further evaluation -OTC artificial tears

## 2016-10-26 NOTE — Assessment & Plan Note (Signed)
Patient declined flu vaccination 10/26/2016.

## 2016-10-26 NOTE — Telephone Encounter (Signed)
Pt's caretaker/ sister calls and states when they went to grocery store today she noticed pt's L eye seemed droopy, ?different?, pt states it's cloudy. She states he has not been in recent times to an eye doc, she would like it checked, appt [facility] at 1545 today

## 2016-10-27 NOTE — Progress Notes (Signed)
Internal Medicine Clinic Attending  I saw and evaluated the patient.  I personally confirmed the key portions of the history and exam documented by Dr. LaCroce and I reviewed pertinent patient test results.  The assessment, diagnosis, and plan were formulated together and I agree with the documentation in the resident's note.  

## 2016-10-27 NOTE — Telephone Encounter (Signed)
[name]9/25/201810:19 AM

## 2016-11-12 DIAGNOSIS — H524 Presbyopia: Secondary | ICD-10-CM | POA: Diagnosis not present

## 2016-12-12 ENCOUNTER — Other Ambulatory Visit: Payer: Self-pay | Admitting: Student in an Organized Health Care Education/Training Program

## 2016-12-29 ENCOUNTER — Ambulatory Visit: Payer: Medicare HMO

## 2017-01-01 ENCOUNTER — Ambulatory Visit: Payer: Medicare HMO

## 2017-01-01 ENCOUNTER — Telehealth: Payer: Self-pay | Admitting: Student in an Organized Health Care Education/Training Program

## 2017-01-05 ENCOUNTER — Other Ambulatory Visit: Payer: Self-pay | Admitting: Pharmacist

## 2017-01-05 DIAGNOSIS — D6859 Other primary thrombophilia: Secondary | ICD-10-CM

## 2017-01-06 MED ORDER — XARELTO 20 MG PO TABS: 20.0000 mg | ORAL_TABLET | Freq: Every day | ORAL | 3 refills | Status: DC

## 2017-01-08 ENCOUNTER — Other Ambulatory Visit: Payer: Self-pay | Admitting: Student in an Organized Health Care Education/Training Program

## 2017-02-08 ENCOUNTER — Ambulatory Visit (INDEPENDENT_AMBULATORY_CARE_PROVIDER_SITE_OTHER): Payer: Medicare HMO | Admitting: Student in an Organized Health Care Education/Training Program

## 2017-02-08 ENCOUNTER — Other Ambulatory Visit: Payer: Self-pay

## 2017-02-08 ENCOUNTER — Encounter: Payer: Self-pay | Admitting: Student in an Organized Health Care Education/Training Program

## 2017-02-08 ENCOUNTER — Encounter (INDEPENDENT_AMBULATORY_CARE_PROVIDER_SITE_OTHER): Payer: Self-pay

## 2017-02-08 VITALS — BP 132/72 | HR 59 | Temp 97.2°F | Ht 68.0 in | Wt 168.5 lb

## 2017-02-08 DIAGNOSIS — F411 Generalized anxiety disorder: Secondary | ICD-10-CM

## 2017-02-08 DIAGNOSIS — Z86711 Personal history of pulmonary embolism: Secondary | ICD-10-CM

## 2017-02-08 DIAGNOSIS — D6859 Other primary thrombophilia: Secondary | ICD-10-CM

## 2017-02-08 DIAGNOSIS — G309 Alzheimer's disease, unspecified: Secondary | ICD-10-CM

## 2017-02-08 DIAGNOSIS — Z86718 Personal history of other venous thrombosis and embolism: Secondary | ICD-10-CM | POA: Diagnosis not present

## 2017-02-08 DIAGNOSIS — F0280 Dementia in other diseases classified elsewhere without behavioral disturbance: Secondary | ICD-10-CM

## 2017-02-08 DIAGNOSIS — Z79899 Other long term (current) drug therapy: Secondary | ICD-10-CM

## 2017-02-08 DIAGNOSIS — Z7901 Long term (current) use of anticoagulants: Secondary | ICD-10-CM | POA: Diagnosis not present

## 2017-02-08 NOTE — Progress Notes (Signed)
   Assessment and Plan:  See Encounters tab for problem-based medical decision making.   __________________________________________________________  HPI:   66 year old man here for follow-up of Alzheimer's dementia.  He is accompanied today by his sister, [name], who is his caretaker during the day.  They report that he is doing very well currently at home.  He has a very severe cognitive impairment with very poor memory, not oriented to time or place often.  He still lives in an apartment building by himself at nights.  His sister brings him to her house for the day every day.  She cooks 3 meals a day for him and make sure that he has food in his refrigerator.  He does not drive.  They deny any safety concerns at this point.  They have looked into assisted living facilities or memory units in the past, had been on a waiting list for a while, but it decided to put this on hold for now.  They think that he is doing fairly well with the current system.  He said no recent illness, hospitalization, emergency or urgent care.  He dresses, bathes, toilets, feeds, and grooms himself.  He reports good compliance with medications with no adverse side effects.  __________________________________________________________  Problem List: Patient Active Problem List   Diagnosis Date Noted  . Hypercoagulable state ([place]) 01/20/2016    Priority: High  . Alzheimer's dementia 12/25/2013    Priority: High  . Osteoarthritis of knees, bilateral 07/13/2011    Priority: Medium  . Generalized anxiety disorder 03/02/2011    Priority: Medium  . Chronic venous insufficiency 06/04/2015    Priority: Low  . Dyslipidemia 06/13/2013    Priority: Low  . Preventative health care 12/09/2012    Priority: Low  . GERD (gastroesophageal reflux disease) 07/13/2011    Priority: Low    Medications: Reconciled today in Epic __________________________________________________________  Physical Exam:  Vital Signs: Vitals:   02/08/17 1207  BP: 132/72  Pulse: (!) 59  Temp: (!) 97.2 F (36.2 C)  TempSrc: Oral  SpO2: 100%  Weight: 168 lb 8 oz (76.4 kg)  Height: 5\' 8"  (1.727 m)    Gen: Well appearing, NAD Neuro: Alert, oriented to person and place, conversational, very pleasant.  Normal upper and lower extremities.  Normal gait.  MMSE 16 out of 30. Psych: Well-groomed, normal affect, smiles and laughs to mask his memory problem, appropriate eye contact.

## 2017-02-08 NOTE — Patient Instructions (Signed)
1. Keep taking all the medicines as prescribed.

## 2017-02-08 NOTE — Assessment & Plan Note (Signed)
Patient with recurrence of DVT and PE who is doing well anticoagulated on rivaroxaban.  Plan to continue with rivaroxaban 20 mg once daily.  Will recheck renal function by summertime.

## 2017-02-08 NOTE — Assessment & Plan Note (Signed)
Symptomatically stable with no ill behavior.  He is high risk given his underlying dementia, but likely has good support system from his family. Plan to continue with Cymbalta 60 mg once daily.

## 2017-02-08 NOTE — Assessment & Plan Note (Signed)
Patient with several years of worsening Alzheimer's dementia.  His cognitive impairment is severe, Mini-Mental status exam 16 out of 30 today.  Mostly affects short-term memory, he is still fairly functional.  He spends his day with his sister, [name], at her apartment.  She feeds him all his meals.  She drops him off at his apartment where he is by himself for the nights.  His son lives in [place] and they have looked into assisted living facilities in memory care units in the past, but currently they want to continue with him living independently with a lot of support from his sister.  We can continue with Aricept 10 mg once daily, he has reasonable expectations about this medications efficacy.

## 2017-04-13 DIAGNOSIS — I480 Paroxysmal atrial fibrillation: Secondary | ICD-10-CM | POA: Diagnosis not present

## 2017-04-13 DIAGNOSIS — Z7901 Long term (current) use of anticoagulants: Secondary | ICD-10-CM | POA: Diagnosis not present

## 2017-04-13 DIAGNOSIS — Z86718 Personal history of other venous thrombosis and embolism: Secondary | ICD-10-CM | POA: Diagnosis not present

## 2017-04-13 DIAGNOSIS — Z86711 Personal history of pulmonary embolism: Secondary | ICD-10-CM | POA: Diagnosis not present

## 2017-06-05 ENCOUNTER — Other Ambulatory Visit: Payer: Self-pay | Admitting: Student in an Organized Health Care Education/Training Program

## 2017-06-14 ENCOUNTER — Encounter (INDEPENDENT_AMBULATORY_CARE_PROVIDER_SITE_OTHER): Payer: Self-pay

## 2017-06-14 ENCOUNTER — Encounter: Payer: Self-pay | Admitting: Student in an Organized Health Care Education/Training Program

## 2017-06-14 ENCOUNTER — Ambulatory Visit (INDEPENDENT_AMBULATORY_CARE_PROVIDER_SITE_OTHER): Payer: Medicare HMO | Admitting: Student in an Organized Health Care Education/Training Program

## 2017-06-14 VITALS — BP 118/78 | HR 49 | Temp 98.0°F | Ht 68.0 in | Wt 168.0 lb

## 2017-06-14 DIAGNOSIS — F0280 Dementia in other diseases classified elsewhere without behavioral disturbance: Secondary | ICD-10-CM | POA: Diagnosis not present

## 2017-06-14 DIAGNOSIS — G309 Alzheimer's disease, unspecified: Secondary | ICD-10-CM | POA: Diagnosis not present

## 2017-06-14 DIAGNOSIS — Z7901 Long term (current) use of anticoagulants: Secondary | ICD-10-CM | POA: Diagnosis not present

## 2017-06-14 DIAGNOSIS — Z86718 Personal history of other venous thrombosis and embolism: Secondary | ICD-10-CM

## 2017-06-14 DIAGNOSIS — D6859 Other primary thrombophilia: Secondary | ICD-10-CM | POA: Diagnosis not present

## 2017-06-14 DIAGNOSIS — Z972 Presence of dental prosthetic device (complete) (partial): Secondary | ICD-10-CM | POA: Diagnosis not present

## 2017-06-14 DIAGNOSIS — Z79899 Other long term (current) drug therapy: Secondary | ICD-10-CM | POA: Diagnosis not present

## 2017-06-14 DIAGNOSIS — Z0000 Encounter for general adult medical examination without abnormal findings: Secondary | ICD-10-CM | POA: Diagnosis not present

## 2017-06-14 NOTE — Assessment & Plan Note (Signed)
Stable moderate Alzheimer's dementia.  Fairly diminished functionally, relies on his sister for many activities of daily living like cooking through the day.  Still lives by himself in an apartment during the night.  No recent safety concerns.  I gave him my recommendations today that assisted living facility would be medically appropriate and probably improve his overall safety.  Patient and family have been holding off on making this move based on their preference.  Likely will take an event like hospitalization before we start seriously considering a move to assisted her memory care.  Plan is to continue with donepezil 10 mg daily for now, he is having no side effects and the progression of his dementia does seem to have slowed.

## 2017-06-14 NOTE — Assessment & Plan Note (Signed)
Stable.  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  No adverse effects, no easy bleeding or bruising.  Plan to check renal function today and continue with rivaroxaban 20 mg once daily.

## 2017-06-14 NOTE — Progress Notes (Signed)
   Assessment and Plan:  See Encounters tab for problem-based medical decision making.   __________________________________________________________  HPI:   67 year old man here for follow-up of Alzheimer's dementia.  He is accompanied by his sister [name], who is primary caregiver locally in [place].  His children live in [place], and goes to visit every once in a while.  Recently just had 3 grandchildren born in the last year.  He reports that things have been going fine, has no complaints.  No recent falls.  No recent illnesses, hospitalizations, or urgent care.  Still goes to spend the day with his sister where he takes his meals.  Then goes back to his apartment at night.  He is by himself in the evening. [name] says that his neighbors in the building keep an eye on him.   Good compliance with medications.  No adverse side effects.  __________________________________________________________  Problem List: Patient Active Problem List   Diagnosis Date Noted  . Hypercoagulable state ([place]) 01/20/2016    Priority: High  . Alzheimer's dementia 12/25/2013    Priority: High  . Osteoarthritis of knees, bilateral 07/13/2011    Priority: Medium  . Generalized anxiety disorder 03/02/2011    Priority: Medium  . Chronic venous insufficiency 06/04/2015    Priority: Low  . Dyslipidemia 06/13/2013    Priority: Low  . Preventative health care 12/09/2012    Priority: Low  . GERD (gastroesophageal reflux disease) 07/13/2011    Priority: Low    Medications: Reconciled today in Epic __________________________________________________________  Physical Exam:  Vital Signs: Vitals:   06/14/17 1027  BP: 118/78  Pulse: (!) 49  Temp: 98 F (36.7 C)  TempSrc: Oral  SpO2: 100%  Weight: 168 lb (76.2 kg)  Height: 5\' 8"  (1.727 m)    Gen: Well appearing, NAD ENT: OP clear without erythema or exudate.  Upper dentures and lower partial dentures, much improved dentition. Neck: No cervical LAD,  No thyromegaly or nodules, No JVD. CV: RRR, no murmurs Pulm: Normal effort, CTA throughout, no wheezing  Ext: Warm, no edema, bilateral crepitus in his knees Skin: No atypical appearing moles. No rashes Neuro: Alert but disoriented, calm, normal strength in the upper and lower extremities, normal gait

## 2017-06-15 ENCOUNTER — Encounter: Payer: Self-pay | Admitting: Student in an Organized Health Care Education/Training Program

## 2017-06-15 LAB — BMP8+ANION GAP
Anion Gap: 15 mmol/L (ref 10.0–18.0)
BUN / CREAT RATIO: 22 (ref 10–24)
BUN: 27 mg/dL (ref 8–27)
CALCIUM: 9.6 mg/dL (ref 8.6–10.2)
CHLORIDE: 105 mmol/L (ref 96–106)
CO2: 22 mmol/L (ref 20–29)
Creatinine, Ser: 1.21 mg/dL (ref 0.76–1.27)
GFR calc Af Amer: 71 mL/min/{1.73_m2} (ref >59)
GFR calc non Af Amer: 61 mL/min/{1.73_m2} (ref >59)
GLUCOSE: 88 mg/dL (ref 65–99)
Potassium: 4.7 mmol/L (ref 3.5–5.2)
SODIUM: 142 mmol/L (ref 134–144)

## 2017-06-15 LAB — HEPATITIS C ANTIBODY: HEP C VIRUS AB: 0.3 {s_co_ratio} (ref 0.0–0.9)

## 2017-07-03 ENCOUNTER — Other Ambulatory Visit: Payer: Self-pay | Admitting: Student in an Organized Health Care Education/Training Program

## 2017-07-15 ENCOUNTER — Ambulatory Visit: Payer: Medicare HMO

## 2017-08-28 DIAGNOSIS — Z111 Encounter for screening for respiratory tuberculosis: Secondary | ICD-10-CM | POA: Diagnosis not present

## 2017-09-01 ENCOUNTER — Other Ambulatory Visit: Payer: Self-pay | Admitting: Internal Medicine

## 2017-10-11 ENCOUNTER — Telehealth: Payer: Self-pay

## 2017-10-11 NOTE — Telephone Encounter (Signed)
Faxed form to [facility] at [facility] @ [phone].

## 2017-11-26 ENCOUNTER — Other Ambulatory Visit: Payer: Self-pay | Admitting: Internal Medicine

## 2017-12-10 ENCOUNTER — Other Ambulatory Visit: Payer: Self-pay | Admitting: *Deleted

## 2017-12-10 ENCOUNTER — Telehealth: Payer: Self-pay | Admitting: Student in an Organized Health Care Education/Training Program

## 2017-12-10 ENCOUNTER — Telehealth: Payer: Self-pay

## 2017-12-10 DIAGNOSIS — D6859 Other primary thrombophilia: Secondary | ICD-10-CM

## 2017-12-10 MED ORDER — DULOXETINE HCL 60 MG PO CPEP: 60.0000 mg | ORAL_CAPSULE | Freq: Every day | ORAL | 3 refills | Status: DC

## 2017-12-10 MED ORDER — OMEPRAZOLE 20 MG PO CPDR: 20.0000 mg | DELAYED_RELEASE_CAPSULE | Freq: Every day | ORAL | 3 refills | Status: DC

## 2017-12-10 MED ORDER — XARELTO 20 MG PO TABS: 20.0000 mg | ORAL_TABLET | Freq: Every day | ORAL | 3 refills | Status: DC

## 2017-12-10 MED ORDER — DONEPEZIL HCL 10 MG PO TABS: 10.0000 mg | ORAL_TABLET | Freq: Every day | ORAL | 3 refills | Status: DC

## 2017-12-10 NOTE — Telephone Encounter (Signed)
Refill Request   XARELTO 20 MG TABS tablet

## 2017-12-10 NOTE — Telephone Encounter (Signed)
Pt's son needs to speak with a nurse about meds, states dad left all the meds at home in [place]. The patient is currently with his son in [place]. [facility]ing [facility] [id number], [place] [address]. Please call back.

## 2017-12-10 NOTE — Telephone Encounter (Signed)
Called number on file [phone]) - sister's # ; informed family member, Xarelto rx was refilled today and [facility] [place]. Stated she will call pt's son and let him know.

## 2017-12-10 NOTE — Telephone Encounter (Signed)
Sent in new encounter 

## 2017-12-10 NOTE — Telephone Encounter (Signed)
The pt uses  [id number] - [place], [place] [address]

## 2017-12-27 ENCOUNTER — Encounter (INDEPENDENT_AMBULATORY_CARE_PROVIDER_SITE_OTHER): Payer: Self-pay

## 2017-12-27 ENCOUNTER — Encounter: Payer: Self-pay | Admitting: Student in an Organized Health Care Education/Training Program

## 2017-12-27 ENCOUNTER — Ambulatory Visit (INDEPENDENT_AMBULATORY_CARE_PROVIDER_SITE_OTHER): Payer: Medicare HMO | Admitting: Student in an Organized Health Care Education/Training Program

## 2017-12-27 VITALS — BP 124/70 | HR 59 | Temp 98.1°F | Wt 177.2 lb

## 2017-12-27 DIAGNOSIS — Z23 Encounter for immunization: Secondary | ICD-10-CM | POA: Diagnosis not present

## 2017-12-27 DIAGNOSIS — D6859 Other primary thrombophilia: Secondary | ICD-10-CM | POA: Diagnosis not present

## 2017-12-27 DIAGNOSIS — F0280 Dementia in other diseases classified elsewhere without behavioral disturbance: Secondary | ICD-10-CM | POA: Diagnosis not present

## 2017-12-27 DIAGNOSIS — C4491 Basal cell carcinoma of skin, unspecified: Secondary | ICD-10-CM | POA: Insufficient documentation

## 2017-12-27 DIAGNOSIS — G309 Alzheimer's disease, unspecified: Secondary | ICD-10-CM

## 2017-12-27 DIAGNOSIS — Z7901 Long term (current) use of anticoagulants: Secondary | ICD-10-CM | POA: Diagnosis not present

## 2017-12-27 DIAGNOSIS — L97511 Non-pressure chronic ulcer of other part of right foot limited to breakdown of skin: Secondary | ICD-10-CM | POA: Diagnosis not present

## 2017-12-27 DIAGNOSIS — Z86718 Personal history of other venous thrombosis and embolism: Secondary | ICD-10-CM | POA: Diagnosis not present

## 2017-12-27 DIAGNOSIS — M159 Polyosteoarthritis, unspecified: Secondary | ICD-10-CM | POA: Diagnosis not present

## 2017-12-27 DIAGNOSIS — C4441 Basal cell carcinoma of skin of scalp and neck: Secondary | ICD-10-CM

## 2017-12-27 DIAGNOSIS — J441 Chronic obstructive pulmonary disease with (acute) exacerbation: Secondary | ICD-10-CM | POA: Diagnosis not present

## 2017-12-27 DIAGNOSIS — Z86711 Personal history of pulmonary embolism: Secondary | ICD-10-CM | POA: Diagnosis not present

## 2017-12-27 DIAGNOSIS — L859 Epidermal thickening, unspecified: Secondary | ICD-10-CM | POA: Diagnosis not present

## 2017-12-27 NOTE — Progress Notes (Signed)
   Assessment and Plan:  See Encounters tab for problem-based medical decision making.   __________________________________________________________  HPI:   67 year old man here for follow-up of Alzheimer's dementia and hypercoagulable state.  Accompanied by his Sister [name], who is very involved in his life.  Since I last seen him he has moved from his apartment into assisted living at [facility].  It sounds like this transition is going very well.  They wanted him to have a more supportive environment with better safety and people around at night.  They are doing all his medications and providing all his meals.  He still visits his sister a couple days a week.  He stays active, helps the facility with chores.  Denies any chest pain or shortness of breath.  No recent hospitalizations or urgent care visits.  No adverse effects to his medications.  Denies any recent falls.  No fevers or chills.  Eating and drinking well.  As usual the patient has no complaints and reports he is doing well.  __________________________________________________________  Problem List: Patient Active Problem List   Diagnosis Date Noted  . Hypercoagulable state ([place]) 01/20/2016    Priority: High  . Alzheimer's dementia ([place]) 12/25/2013    Priority: High  . Basal cell carcinoma (BCC) 12/27/2017    Priority: Medium  . Osteoarthritis of knees, bilateral 07/13/2011    Priority: Medium  . Generalized anxiety disorder 03/02/2011    Priority: Medium  . Chronic venous insufficiency 06/04/2015    Priority: Low  . Dyslipidemia 06/13/2013    Priority: Low  . Preventative health care 12/09/2012    Priority: Low  . GERD (gastroesophageal reflux disease) 07/13/2011    Priority: Low    Medications: Reconciled today in Epic __________________________________________________________  Physical Exam:  Vital Signs: Vitals:   12/27/17 1035  BP: 124/70  Pulse: (!) 59  Temp: 98.1 F (36.7 C)  TempSrc: Oral  SpO2:  100%  Weight: 177 lb 3.2 oz (80.4 kg)    Gen: Well appearing, NAD Neck: No cervical LAD, No thyromegaly or nodules, No JVD. CV: RRR, no murmurs Pulm: Normal effort, CTA throughout, no wheezing Ext: Warm, 1+ pitting equal edema bilaterally, diffuse osteoarthritis in his hands and knees Skin: The scalp has 2 hyperkeratotic invasive lesions that are about 1 cm each, appear friable and have signs of recent bleeding.  Most consistent with basal cell carcinomas.

## 2017-12-27 NOTE — Assessment & Plan Note (Signed)
2 lesions on his scalp most consistent with basal cell carcinoma.  We talked about local invasive potential, I offered him referral to dermatology for surgical excision.  Patient and his sister decline, do not want to go through any surgeries right now.  We will continue to monitor them for local advancement.

## 2017-12-27 NOTE — Assessment & Plan Note (Signed)
Appears stable to me, sister thinks his memory is getting worse.  Recently moved out of his house into assisted living facility at [facility].  I am very supportive of this mood as I think he will benefit from assisted environment for safety.  Seems like he is doing very well there.  We are continuing with donepezil, though it has been of limited benefit.

## 2017-12-27 NOTE — Assessment & Plan Note (Signed)
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  Doing very well on anticoagulation with no further clots.  No side effects, no bleeds.  Renal function has been stable.  Plan to continue with Xarelto 20 mg daily.

## 2017-12-27 NOTE — Patient Instructions (Signed)
Continue to take your medicines as prescribed.  Keep up the great work.  Let me know if those 2 lesions on your scalp any worse.

## 2018-02-03 ENCOUNTER — Telehealth: Payer: Self-pay | Admitting: *Deleted

## 2018-02-03 NOTE — Telephone Encounter (Signed)
I think patient should be evaluated in [facility] prior to any order being given. Thank you

## 2018-02-03 NOTE — Telephone Encounter (Signed)
Pt's sister calls and states pt came back from holiday visit w/ family having a cough, cold, low grade fever- 99.8 oral-. They would not check his oral temp at facility nor would they give him tylenol. Pt's sister gave him tylenol after checking temp and gave him some otc cough and cold- store brand for people w/ HTN. She would like orders sent to facility for these things.

## 2018-02-04 ENCOUNTER — Ambulatory Visit: Payer: Medicare HMO

## 2018-02-04 NOTE — Telephone Encounter (Signed)
Per sister pt wants to wait, resch for mon 1/6

## 2018-02-05 ENCOUNTER — Telehealth: Payer: Self-pay | Admitting: Internal Medicine

## 2018-02-05 DIAGNOSIS — J209 Acute bronchitis, unspecified: Secondary | ICD-10-CM | POA: Diagnosis not present

## 2018-02-05 DIAGNOSIS — K432 Incisional hernia without obstruction or gangrene: Secondary | ICD-10-CM

## 2018-02-05 DIAGNOSIS — I82409 Acute embolism and thrombosis of unspecified deep veins of unspecified lower extremity: Secondary | ICD-10-CM

## 2018-02-05 DIAGNOSIS — R05 Cough: Secondary | ICD-10-CM | POA: Diagnosis not present

## 2018-02-05 DIAGNOSIS — R067 Sneezing: Secondary | ICD-10-CM | POA: Diagnosis not present

## 2018-02-05 MED ORDER — ACETAMINOPHEN 500 MG PO TABS: 500.0000 mg | ORAL_TABLET | Freq: Four times a day (QID) | ORAL | 3 refills | Status: DC | PRN

## 2018-02-05 NOTE — Telephone Encounter (Signed)
Received page from Lelan Pons at [facility] that patient had a headache but no PRN medications ordered. Asked if a prescription could be sent over. I have reviewed his chart and will send over a prescription for acetaminophen.

## 2018-02-07 ENCOUNTER — Telehealth: Payer: Self-pay | Admitting: *Deleted

## 2018-02-07 ENCOUNTER — Ambulatory Visit: Payer: Medicare HMO

## 2018-02-07 NOTE — Telephone Encounter (Signed)
Call from pt's sister, Ledora Bottcher - stated she took pt to UC over the w/e; dx bronchitis and sinus infection. Given steroids and abx.She will have UC fax notes to [place]. And she wants to cancel his appt today @ 1445 PM in [facility].

## 2018-02-07 NOTE — Telephone Encounter (Signed)
Okay thank you

## 2018-02-11 ENCOUNTER — Encounter: Payer: Self-pay | Admitting: Internal Medicine

## 2018-02-11 ENCOUNTER — Encounter (INDEPENDENT_AMBULATORY_CARE_PROVIDER_SITE_OTHER): Payer: Self-pay

## 2018-02-11 ENCOUNTER — Ambulatory Visit (INDEPENDENT_AMBULATORY_CARE_PROVIDER_SITE_OTHER): Payer: Medicare HMO | Admitting: Internal Medicine

## 2018-02-11 VITALS — BP 132/86 | HR 58 | Temp 97.9°F | Ht 68.0 in | Wt 179.0 lb

## 2018-02-11 DIAGNOSIS — Z87891 Personal history of nicotine dependence: Secondary | ICD-10-CM

## 2018-02-11 DIAGNOSIS — J320 Chronic maxillary sinusitis: Secondary | ICD-10-CM

## 2018-02-11 DIAGNOSIS — F0280 Dementia in other diseases classified elsewhere without behavioral disturbance: Secondary | ICD-10-CM

## 2018-02-11 DIAGNOSIS — J329 Chronic sinusitis, unspecified: Secondary | ICD-10-CM | POA: Insufficient documentation

## 2018-02-11 DIAGNOSIS — J328 Other chronic sinusitis: Secondary | ICD-10-CM | POA: Diagnosis not present

## 2018-02-11 DIAGNOSIS — J310 Chronic rhinitis: Secondary | ICD-10-CM

## 2018-02-11 DIAGNOSIS — G309 Alzheimer's disease, unspecified: Secondary | ICD-10-CM

## 2018-02-11 MED ORDER — FLUTICASONE PROPIONATE 50 MCG/ACT NA SUSP: 1.0000 | Freq: Every day | NASAL | 0 refills | Status: DC

## 2018-02-11 MED ORDER — GUAIFENESIN ER 600 MG PO TB12: 600.0000 mg | ORAL_TABLET | Freq: Two times a day (BID) | ORAL | 0 refills | Status: DC

## 2018-02-11 NOTE — Progress Notes (Signed)
CC: Frontal headache  HPI:  JordanJordan Long. is a 68 y.o. has medical history listed below, presented to clinic for no improvement of taking pain in his frontal head and around his eyes.  Jordan Long has history of Alzheimer's dementia and is here in clinic today with his sister, who provides part of the history.  Past Medical History:  Diagnosis Date  . DVT (deep venous thrombosis) ([place]) 2010 and 10/2010   after Hernia repair in 2010 s/p IVC filter and coumadin. Next episode after Hernia repair  ( 10/23/2010) . Admitted on 11/02/2010 for repeat DVT. Warfarin was stopped for  5 days before surgery.   Marland Long DVT (deep venous thrombosis) ([place]) 10/2011   right leg after knee surgery  . GERD (gastroesophageal reflux disease)   . Incisional hernia 08/20/2010  . Pulmonary embolism ([place]) 2010   Social history:  Jordan Long lives in assisted living facility, he is known as a supportive member there.  Former smoker No illicit drug use No Alcohol use  Family history: DM      Mother Parkinson disease     Mother Lymphoma          Father  Family History  Problem Relation Age of Onset  . Cancer Father        Lymphoma   . Raynaud syndrome Father   . Diabetes Mother   . Parkinsonism Mother   . Thyroid disease Sister   . Diabetes Sister   . Raynaud syndrome Sister   . Lupus Sister   . Raynaud syndrome Sister   . Colon cancer Neg Hx   . Heart disease Mother   . Heart attack Unknown        neice  . Schizophrenia Mother    Review of Systems: Review of Systems  Respiratory: Positive for cough. Negative for shortness of breath.   Cardiovascular: Negative for chest pain and leg swelling.  Gastrointestinal: Negative for nausea and vomiting.  Genitourinary: Negative for hematuria.  Neurological: Positive for headaches.  Endo/Heme/Allergies: Does not bruise/bleed easily.     Physical Exam:  Vitals:   02/11/18 1344  BP: 132/86  Pulse: (!) 58  Temp: 97.9 F (36.6 C)    TempSrc: Oral  SpO2: 100%  Weight: 179 lb (81.2 kg)  Height: 5\' 8"  (1.727 m)   Physical Exam Constitutional:      Appearance: Normal appearance. He is not ill-appearing.  HENT:     Has tenderness on Maxillary and frontal area    Nose: No rhinorrhea.     Mouth/Throat:     Pharynx: Oropharynx is clear. No oropharyngeal exudate.  Eyes:     Extraocular Movements: Extraocular movements intact.  Cardiovascular:     Rate and Rhythm: Normal rate and regular rhythm.     Pulses: Normal pulses.     Heart sounds: Normal heart sounds. No murmur.  Pulmonary:     Effort: Pulmonary effort is normal. No respiratory distress.     Breath sounds: Normal breath sounds. No wheezing or rales.  Abdominal:     General: Bowel sounds are normal.     Palpations: Abdomen is soft.     Tenderness: There is no abdominal tenderness.  Neurological:     General: No focal deficit present.     Mental Status: He is alert. Mental status is at baseline.  Psychiatric:        Mood and Affect: Mood normal.    Assessment & Plan:   See Encounters Tab for  problem based charting.  Patient seen with Dr. Evette Doffing

## 2018-02-11 NOTE — Patient Instructions (Addendum)
Thank you for allowing [place] to provide your care today.  You came into the clinic due to congestion, headache and pressure ache on your face for 2 weeks that did not improve with antibiotic. Your symptoms seem to be due to viral sinusitis.   I prescribed Flonase spray, Mucinex and Tylenol for you to help with your symptoms.   Please take them as directed below:  -Mucinex 600 mg twice daily (8 AM and 8 PM) for 4-5 days. -Tylenol 500 mg twice daily for headache (8 AM and 8 PM) for 3 days. -Flonase, spray 1 puff in each nostril twice daily (8 AM and 8 PM) for 5 days  Today we did not make any changes to your previous medications so continue to take them as before.    Please follow-up in clinic as needed.  Should you have any questions or concerns please call the internal medicine clinic at [phone].    Thank you

## 2018-02-11 NOTE — Assessment & Plan Note (Addendum)
Patient presented with 2 weeks history of nasal congestion, headache, feeling full on his sinuses.  Was seen at urgent care a week ago and received Rocephin, dexamethasone and prescribed azithromycin.  Still complains of pain on frontal area of his head and around his eyes.  No fever chills.  No body ache.  Has some on and off sore throat but no pain today.  Has mild dry cough. On exam today: Is afebrile, has tenderness on his frontal and maxillary sinuses.  No exudate on throat exam.  Lungs are clear to auscultation.  Likely to be viral rhinosinusitis.  -Mucinex 600 mg twice daily for 4-5 days -Tylenol 500 mg twice daily for headache for 3 dyas -Flonase, spray 1 puff in each nostril twice daily for 5 days

## 2018-02-11 NOTE — Progress Notes (Signed)
Internal Medicine Clinic Attending  I saw and evaluated the patient.  I personally confirmed the key portions of the history and exam documented by Dr. Masoudi and I reviewed pertinent patient test results.  The assessment, diagnosis, and plan were formulated together and I agree with the documentation in the resident's note. 

## 2018-02-14 ENCOUNTER — Telehealth: Payer: Self-pay | Admitting: Student in an Organized Health Care Education/Training Program

## 2018-02-14 ENCOUNTER — Encounter: Payer: Self-pay | Admitting: Student in an Organized Health Care Education/Training Program

## 2018-02-14 DIAGNOSIS — I82409 Acute embolism and thrombosis of unspecified deep veins of unspecified lower extremity: Secondary | ICD-10-CM

## 2018-02-14 DIAGNOSIS — K432 Incisional hernia without obstruction or gangrene: Secondary | ICD-10-CM

## 2018-02-14 NOTE — Telephone Encounter (Signed)
Responded to request from [facility] with updated meds/orders to manage his viral rhinosinusitis. Same orders as we had at his recent [facility] visit.

## 2018-02-24 ENCOUNTER — Telehealth: Payer: Self-pay | Admitting: Student in an Organized Health Care Education/Training Program

## 2018-02-25 ENCOUNTER — Encounter: Payer: Self-pay | Admitting: *Deleted

## 2018-03-03 ENCOUNTER — Ambulatory Visit: Payer: Medicare HMO

## 2018-03-04 ENCOUNTER — Ambulatory Visit: Payer: Medicare HMO

## 2018-03-07 ENCOUNTER — Other Ambulatory Visit: Payer: Self-pay | Admitting: Internal Medicine

## 2018-03-07 NOTE — Telephone Encounter (Signed)
Spoke w/ med tech at care facility, Jordan Long, gave her verbal to stop mucinex Called her attention to disch visit summary from visit 1/10 Have now spoken to caretaker, sister, she states they informed her to buy more mucinex Called Jordan Long back and she has a paper that facility dated 1/31 which was written by dr Evette Doffing 1/13.  Have faxed a stop date of 5 days after first of mucinex and is written on copy of disch summary, signed by triage nurse Do you agree?

## 2018-03-07 NOTE — Telephone Encounter (Signed)
Requesting to speak with Jordan Long, please call back.

## 2018-03-07 NOTE — Telephone Encounter (Signed)
Agree 

## 2018-03-10 ENCOUNTER — Ambulatory Visit: Payer: Medicare HMO

## 2018-03-16 ENCOUNTER — Ambulatory Visit: Payer: Medicare HMO

## 2018-03-19 ENCOUNTER — Emergency Department (HOSPITAL_COMMUNITY)
Admission: EM | Admit: 2018-03-19 | Discharge: 2018-03-20 | Disposition: A | Discharge status: Home / Self Care | Payer: Medicare HMO | Attending: Emergency Medicine | Admitting: Emergency Medicine

## 2018-03-19 ENCOUNTER — Encounter (HOSPITAL_COMMUNITY): Payer: Self-pay

## 2018-03-19 ENCOUNTER — Other Ambulatory Visit: Payer: Self-pay

## 2018-03-19 DIAGNOSIS — Z046 Encounter for general psychiatric examination, requested by authority: Secondary | ICD-10-CM | POA: Insufficient documentation

## 2018-03-19 DIAGNOSIS — Z87891 Personal history of nicotine dependence: Secondary | ICD-10-CM | POA: Insufficient documentation

## 2018-03-19 DIAGNOSIS — Z85828 Personal history of other malignant neoplasm of skin: Secondary | ICD-10-CM | POA: Insufficient documentation

## 2018-03-19 DIAGNOSIS — F0281 Dementia in other diseases classified elsewhere with behavioral disturbance: Secondary | ICD-10-CM | POA: Diagnosis not present

## 2018-03-19 DIAGNOSIS — Z79899 Other long term (current) drug therapy: Secondary | ICD-10-CM | POA: Insufficient documentation

## 2018-03-19 DIAGNOSIS — F0391 Unspecified dementia with behavioral disturbance: Secondary | ICD-10-CM | POA: Diagnosis not present

## 2018-03-19 DIAGNOSIS — G309 Alzheimer's disease, unspecified: Secondary | ICD-10-CM | POA: Insufficient documentation

## 2018-03-19 DIAGNOSIS — R451 Restlessness and agitation: Secondary | ICD-10-CM | POA: Diagnosis present

## 2018-03-19 DIAGNOSIS — R456 Violent behavior: Secondary | ICD-10-CM | POA: Diagnosis not present

## 2018-03-19 LAB — COMPREHENSIVE METABOLIC PANEL
ALBUMIN: 3.8 g/dL (ref 3.5–5.0)
ALT: 19 U/L (ref 0–44)
AST: 24 U/L (ref 15–41)
Alkaline Phosphatase: 86 U/L (ref 38–126)
Anion gap: 7 (ref 5–15)
BILIRUBIN TOTAL: 0.6 mg/dL (ref 0.3–1.2)
BUN: 25 mg/dL — ABNORMAL HIGH (ref 8–23)
CO2: 27 mmol/L (ref 22–32)
Calcium: 9 mg/dL (ref 8.9–10.3)
Chloride: 108 mmol/L (ref 98–111)
Creatinine, Ser: 1.32 mg/dL — ABNORMAL HIGH (ref 0.61–1.24)
GFR calc Af Amer: >60 mL/min (ref >60)
GFR calc non Af Amer: 55 mL/min — ABNORMAL LOW (ref >60)
Glucose, Bld: 102 mg/dL — ABNORMAL HIGH (ref 70–99)
POTASSIUM: 4.4 mmol/L (ref 3.5–5.1)
Sodium: 142 mmol/L (ref 135–145)
Total Protein: 6.8 g/dL (ref 6.5–8.1)

## 2018-03-19 LAB — CBC WITH DIFFERENTIAL/PLATELET
Abs Immature Granulocytes: 0.02 10*3/uL (ref 0.00–0.07)
BASOS ABS: 0 10*3/uL (ref 0.0–0.1)
Basophils Relative: 1 %
EOS ABS: 0.2 10*3/uL (ref 0.0–0.5)
Eosinophils Relative: 3 %
HEMATOCRIT: 41.6 % (ref 39.0–52.0)
Hemoglobin: 13 g/dL (ref 13.0–17.0)
Immature Granulocytes: 0 %
Lymphocytes Relative: 19 %
Lymphs Abs: 1.3 10*3/uL (ref 0.7–4.0)
MCH: 28.6 pg (ref 26.0–34.0)
MCHC: 31.3 g/dL (ref 30.0–36.0)
MCV: 91.4 fL (ref 80.0–100.0)
Monocytes Absolute: 0.8 10*3/uL (ref 0.1–1.0)
Monocytes Relative: 11 %
NRBC: 0 % (ref 0.0–0.2)
Neutro Abs: 4.5 10*3/uL (ref 1.7–7.7)
Neutrophils Relative %: 66 %
Platelets: 276 10*3/uL (ref 150–400)
RBC: 4.55 MIL/uL (ref 4.22–5.81)
RDW: 13.2 % (ref 11.5–15.5)
WBC: 6.9 10*3/uL (ref 4.0–10.5)

## 2018-03-19 LAB — ETHANOL: Alcohol, Ethyl (B): <10 mg/dL (ref <10)

## 2018-03-19 NOTE — ED Notes (Signed)
TTS at bedside. 

## 2018-03-19 NOTE — ED Notes (Signed)
Pt aware that urine sample is needed. Unable to provide right now. Urinal at bedside

## 2018-03-19 NOTE — ED Notes (Signed)
Urine and culture sent to lab  

## 2018-03-19 NOTE — ED Notes (Signed)
Bed: WA20 Expected date:  Expected time:  Means of arrival:  Comments: 68 yo aggressive behavior

## 2018-03-19 NOTE — ED Provider Notes (Signed)
[facility] DEPT Provider Note   CSN: [id number] Arrival date & time: 03/19/18  1851     History   Chief Complaint Behavior disturbance  HPI Jordan Camper Purpura Brooke Bonito. is a 68 y.o. male.  HPI Patient presents to the emergency room for evaluation of agitation.  According to the EMS report patient has a history of dementia.  He became very agitated and violent this evening.  He was actually throwing chairs at his living facility.  Staff indicated that the patient has not had this type of behavior before.  Right now in the emergency room the patient is calm and pleasant.  He does not specifically remember what happened.  He states he must have gotten angry at himself.  Now he denies any complaints.  He denies any trouble with headache or chest pain.  He is not feeling angry or upset. Past Medical History:  Diagnosis Date  . DVT (deep venous thrombosis) ([place]) 2010 and 10/2010   after Hernia repair in 2010 s/p IVC filter and coumadin. Next episode after Hernia repair  ( 10/23/2010) . Admitted on 11/02/2010 for repeat DVT. Warfarin was stopped for  5 days before surgery.   Marland Kitchen DVT (deep venous thrombosis) ([place]) 10/2011   right leg after knee surgery  . GERD (gastroesophageal reflux disease)   . Incisional hernia 08/20/2010  . Pulmonary embolism ([place]) 2010    Patient Active Problem List   Diagnosis Date Noted  . Rhinosinusitis 02/11/2018  . Basal cell carcinoma (BCC) 12/27/2017  . Hypercoagulable state ([place]) 01/20/2016  . Chronic venous insufficiency 06/04/2015  . Alzheimer's dementia ([place]) 12/25/2013  . Dyslipidemia 06/13/2013  . Preventative health care 12/09/2012  . Osteoarthritis of knees, bilateral 07/13/2011  . GERD (gastroesophageal reflux disease) 07/13/2011  . Generalized anxiety disorder 03/02/2011    Past Surgical History:  Procedure Laterality Date  . APPENDECTOMY  age 34  . FEMORAL ARTERY STENT Right 2010  . HERNIA REPAIR  2010  . HERNIA  REPAIR  10/23/2010   Incisional hernia w/mesh  . HERNIA REPAIR    . INCISIONAL HERNIA REPAIR N/A 09/22/2012   Procedure: INICISIONAL HERNIA AND BILATERAL RECURRENT INGUINAL HERNIA  REPAIRED [place] ;  Surgeon: Adin Hector, MD;  Location: [facility];  Service: General;  Laterality: N/A;  . INGUINAL HERNIA REPAIR Bilateral 09/22/2012   Procedure: LAPAROSCOPIC BILATERAL INGUINAL HERNIA REPAIR;  Surgeon: Adin Hector, MD;  Location: [facility];  Service: General;  Laterality: Bilateral;  RECURRENT INGUINAL HERNIAS BILATERAL   . INSERTION OF MESH Bilateral 09/22/2012   Procedure: INSERTION OF MESH;  Surgeon: Adin Hector, MD;  Location: [facility];  Service: General;  Laterality: Bilateral;  FOR REPAIR OF BILATERAL INGUINAL HERNIAS AND ALSO MESH INSERTED FOR VENTRAL HERNIA REPAIR   . KNEE SURGERY Right 10/2011   [facility]  . LAPAROSCOPIC LYSIS OF ADHESIONS N/A 09/22/2012   Procedure: LAPAROSCOPIC LYSIS OF ADHESIONS 90 minutes ;  Surgeon: Adin Hector, MD;  Location: [facility];  Service: General;  Laterality: N/A;  . septic abdominal surgery  2010  . TONSILLECTOMY  as child        Home Medications    Prior to Admission medications   Medication Sig Start Date End Date Taking? Authorizing Provider  acetaminophen (TYLENOL) 500 MG tablet Take 1 tablet (500 mg total) by mouth every 6 (six) hours as needed for headache. 02/05/18  Yes Helberg, Larkin Ina, MD  donepezil (ARICEPT) 10 MG tablet Take 1 tablet (10 mg total) by mouth  at bedtime. 12/10/17  Yes Axel Filler, MD  DULoxetine (CYMBALTA) 60 MG capsule Take 1 capsule (60 mg total) by mouth daily. 12/10/17  Yes Axel Filler, MD  omeprazole (PRILOSEC) 20 MG capsule Take 1 capsule (20 mg total) by mouth daily. 12/10/17  Yes Axel Filler, MD  XARELTO 20 MG TABS tablet Take 1 tablet (20 mg total) by mouth daily. 12/10/17  Yes Axel Filler, MD  fluticasone [facility]) 50 MCG/ACT nasal spray Place 1 spray into both nostrils daily  for 7 days. 8AM and 8PM 02/11/18 02/18/18  Masoudi, Dorthula Rue, MD  guaiFENesin (MUCINEX) 600 MG 12 hr tablet Take 1 tablet (600 mg total) by mouth 2 (two) times daily. 8 Am and 8 PM Patient not taking: Reported on 03/19/2018 02/11/18 02/11/19  Dewayne Hatch, MD    Family History Family History  Problem Relation Age of Onset  . Cancer Father        Lymphoma   . Raynaud syndrome Father   . Diabetes Mother   . Parkinsonism Mother   . Heart disease Mother   . Schizophrenia Mother   . Thyroid disease Sister   . Diabetes Sister   . Raynaud syndrome Sister   . Lupus Sister   . Raynaud syndrome Sister   . Heart attack Other        neice  . Colon cancer Neg Hx     Social History Social History   Tobacco Use  . Smoking status: Former Smoker    Years: 2.00    Types: Cigarettes    Last attempt to quit: 09/23/1985    Years since quitting: 32.5  . Smokeless tobacco: Never Used  Substance Use Topics  . Alcohol use: No    Alcohol/week: 0.0 standard drinks  . Drug use: No     Allergies   Patient has no known allergies.   Review of Systems Review of Systems  All other systems reviewed and are negative.    Physical Exam Updated Vital Signs BP 112/79 (BP Location: Left Arm)   Pulse 64   Temp 98.2 F (36.8 C) (Oral)   Resp 14   SpO2 98%   Physical Exam Vitals signs and nursing note reviewed.  Constitutional:      General: He is not in acute distress.    Appearance: He is well-developed.  HENT:     Head: Normocephalic and atraumatic.     Right Ear: External ear normal.     Left Ear: External ear normal.  Eyes:     General: No scleral icterus.       Right eye: No discharge.        Left eye: No discharge.     Conjunctiva/sclera: Conjunctivae normal.  Neck:     Musculoskeletal: Neck supple.     Trachea: No tracheal deviation.  Cardiovascular:     Rate and Rhythm: Normal rate and regular rhythm.  Pulmonary:     Effort: Pulmonary effort is normal. No  respiratory distress.     Breath sounds: Normal breath sounds. No stridor. No wheezing or rales.  Abdominal:     General: Bowel sounds are normal. There is no distension.     Palpations: Abdomen is soft.     Tenderness: There is no abdominal tenderness. There is no guarding or rebound.  Musculoskeletal:        General: No tenderness.  Skin:    General: Skin is warm and dry.     Findings: No rash.  Neurological:  Mental Status: He is alert.     Cranial Nerves: No cranial nerve deficit (no facial droop, extraocular movements intact, no slurred speech).     Sensory: No sensory deficit.     Motor: No abnormal muscle tone or seizure activity.     Coordination: Coordination normal.  Psychiatric:        Mood and Affect: Mood normal.        Speech: Speech normal.        Behavior: Behavior normal. Behavior is not aggressive or hyperactive. Behavior is cooperative.        Thought Content: Thought content does not include homicidal or suicidal ideation.        Cognition and Memory: Memory is impaired.      ED Treatments / Results  Labs (all labs ordered are listed, but only abnormal results are displayed) Labs Reviewed  COMPREHENSIVE METABOLIC PANEL - Abnormal; Notable for the following components:      Result Value   Glucose, Bld 102 (*)    BUN 25 (*)    Creatinine, Ser 1.32 (*)    GFR calc non Af Amer 55 (*)    All other components within normal limits  ETHANOL  RAPID URINE DRUG SCREEN, HOSP PERFORMED  CBC WITH DIFFERENTIAL/PLATELET  URINALYSIS, ROUTINE W REFLEX MICROSCOPIC    EKG EKG Interpretation  Date/Time:  Saturday March 19 2018 19:24:22 EST Ventricular Rate:  70 PR Interval:    QRS Duration: 109 QT Interval:  408 QTC Calculation: 441 R Axis:   -45 Text Interpretation:  Sinus rhythm Short PR interval Abnormal R-wave progression, early transition Inferior infarct, old No significant change since last tracing Confirmed by Dorie Rank [phone]) on 03/19/2018 7:34:27  PM   Radiology No results found.  Procedures Procedures (including critical care time)  Medications Ordered in ED Medications - No data to display   Initial Impression / Assessment and Plan / ED Course  I have reviewed the triage vital signs and the nursing notes.  Pertinent labs & imaging results that were available during my care of the patient were reviewed by me and considered in my medical decision making (see chart for details).  Clinical Course as of Mar 20 26  Sat Mar 19, 2018  2340 Pt was assessed by TTS.  No indications for inpatient tx at this time.  Pt is calm and cooperative.   [JK]  Sun Mar 20, 2018  0013 Family requested checking a UA.     [JK]    Clinical Course User Index [JK] Dorie Rank, MD    Pt presented after an outburst at his living facility.  Pt has remained calm in the ED.  Labs reassuring.  UA pending.  Anticipate dc back to nursing facility.  Pt has been cleared by psychiatry.  Final Clinical Impressions(s) / ED Diagnoses   Final diagnoses:  Dementia with behavioral disturbance, unspecified dementia type [facility])    ED Discharge Orders    None       Dorie Rank, MD 03/20/18 0028

## 2018-03-19 NOTE — ED Notes (Addendum)
Pt unable to provide urine sample right now. Pt calm and cooperative with staff

## 2018-03-19 NOTE — ED Triage Notes (Signed)
Patient presented to ed with c/o aggressive  behavior during dinner. Pushing staff and throw the chair at the facility. Patient coming from [facility].

## 2018-03-19 NOTE — ED Notes (Addendum)
Pt still unable to give urine sample. Pt calm and cooperative with staff members.

## 2018-03-19 NOTE — BH Assessment (Addendum)
Assessment Note  Jordan Kellogg. is an 68 y.o. male.  -Clinician reviewed note by Dr. Dorie Rank.  Patient presents to the emergency room for evaluation of agitation.  According to the EMS report patient has a history of dementia.  He became very agitated and violent this evening.  He was actually throwing chairs at his living facility.  Staff indicated that the patient has not had this type of behavior before.  Right now in the emergency room the patient is calm and pleasant.  He does not specifically remember what happened.  He states he must have gotten angry at himself.  Now he denies any complaints.  Clinician talked with patient.  Patient denies any SI, HI or A/V hallucinations.  Patient is not oriented.  He was unfamiliar with where he is presently.  When asked what his birthday was he said [birth year]. Patient was not familiar with where he lives.  He did mention his sister and said he lived with her.  When [facility] is mentioned he said that sounded familiar.  He has no memory however of any events that occurred there tonight.  Patient's sister was contacted w/ permission from patient.  Jordan Long [phone] said that she was worried that he had an UTI.  She said she felt that patient would be able to go back to [facility].  Patient has not yet been able to provide a urine sample.  Patient has no hx of inpatient or outpatient psychiatric care.  -Clinician discussed patient care with Lindon Romp, FNP.  He had recommended pt be observed overnight.  Clinician talked with Dr. Dorie Rank.  Labs still need to be completed but he did not see a reason for patient not to go back if facility will take him back.  Diagnosis: Dementia  Past Medical History:  Past Medical History:  Diagnosis Date  . DVT (deep venous thrombosis) ([place]) 2010 and 10/2010   after Hernia repair in 2010 s/p IVC filter and coumadin. Next episode after Hernia repair  ( 10/23/2010) . Admitted on 11/02/2010 for repeat  DVT. Warfarin was stopped for  5 days before surgery.   Marland Kitchen DVT (deep venous thrombosis) ([place]) 10/2011   right leg after knee surgery  . GERD (gastroesophageal reflux disease)   . Incisional hernia 08/20/2010  . Pulmonary embolism ([place]) 2010    Past Surgical History:  Procedure Laterality Date  . APPENDECTOMY  age 15  . FEMORAL ARTERY STENT Right 2010  . HERNIA REPAIR  2010  . HERNIA REPAIR  10/23/2010   Incisional hernia w/mesh  . HERNIA REPAIR    . INCISIONAL HERNIA REPAIR N/A 09/22/2012   Procedure: INICISIONAL HERNIA AND BILATERAL RECURRENT INGUINAL HERNIA  REPAIRED [place] ;  Surgeon: Adin Hector, MD;  Location: [facility];  Service: General;  Laterality: N/A;  . INGUINAL HERNIA REPAIR Bilateral 09/22/2012   Procedure: LAPAROSCOPIC BILATERAL INGUINAL HERNIA REPAIR;  Surgeon: Adin Hector, MD;  Location: [facility];  Service: General;  Laterality: Bilateral;  RECURRENT INGUINAL HERNIAS BILATERAL   . INSERTION OF MESH Bilateral 09/22/2012   Procedure: INSERTION OF MESH;  Surgeon: Adin Hector, MD;  Location: [facility];  Service: General;  Laterality: Bilateral;  FOR REPAIR OF BILATERAL INGUINAL HERNIAS AND ALSO MESH INSERTED FOR VENTRAL HERNIA REPAIR   . KNEE SURGERY Right 10/2011   [facility]  . LAPAROSCOPIC LYSIS OF ADHESIONS N/A 09/22/2012   Procedure: LAPAROSCOPIC LYSIS OF ADHESIONS 90 minutes ;  Surgeon: Adin Hector,  MD;  Location: [facility];  Service: General;  Laterality: N/A;  . septic abdominal surgery  2010  . TONSILLECTOMY  as child    Family History:  Family History  Problem Relation Age of Onset  . Cancer Father        Lymphoma   . Raynaud syndrome Father   . Diabetes Mother   . Parkinsonism Mother   . Heart disease Mother   . Schizophrenia Mother   . Thyroid disease Sister   . Diabetes Sister   . Raynaud syndrome Sister   . Lupus Sister   . Raynaud syndrome Sister   . Heart attack Other        neice  . Colon cancer Neg Hx     Social History:  reports that he  quit smoking about 32 years ago. His smoking use included cigarettes. He quit after 2.00 years of use. He has never used smokeless tobacco. He reports that he does not drink alcohol or use drugs.  Additional Social History:  Alcohol / Drug Use Pain Medications: See PTA medication list Prescriptions: See PTA medication list Over the Counter: See PTA medication list History of alcohol / drug use?: No history of alcohol / drug abuse  CIWA: CIWA-Ar BP: 115/80 Pulse Rate: 66 COWS:    Allergies: No Known Allergies  Home Medications: (Not in a hospital admission)   OB/GYN Status:  No LMP for male patient.  General Assessment Data Location of Assessment: [facility] TTS Assessment: In system Is this a Tele or Face-to-Face Assessment?: Face-to-Face Is this an Initial Assessment or a Re-assessment for this encounter?: Initial Assessment Patient Accompanied by:: N/A Language Other than English: No Living Arrangements: In Assisted Living/Nursing Home (Comment: Name of Nursing Home([facility]) What gender do you identify as?: Male Marital status: Single Pregnancy Status: No Living Arrangements: Other (Comment)([facility] facility) Can pt return to current living arrangement?: Yes Admission Status: Voluntary Is patient capable of signing voluntary admission?: No Referral Source: Other(Staff at [facility]) Insurance type: [facility]     Crisis Care Plan Living Arrangements: Other (Comment)([facility] facility) Name of Psychiatrist: None Name of Therapist: None  Education Status Is patient currently in school?: No Is the patient employed, unemployed or receiving disability?: Unemployed  Risk to self with the past 6 months Suicidal Ideation: No Has patient been a risk to self within the past 6 months prior to admission? : No Suicidal Intent: No Has patient had any suicidal intent within the past 6 months prior to admission? : No Is patient at risk for suicide?: No Suicidal Plan?:  No Has patient had any suicidal plan within the past 6 months prior to admission? : No Access to Means: No What has been your use of drugs/alcohol within the last 12 months?: N/A Previous Attempts/Gestures: No How many times?: 0 Other Self Harm Risks: None Triggers for Past Attempts: None known Intentional Self Injurious Behavior: None Family Suicide History: Unknown Recent stressful life event(s): Other (Comment)(Pt cannot identify any recent stressors) Persecutory voices/beliefs?: No Depression: No Depression Symptoms: (Pt denies depressive symptoms.) Substance abuse history and/or treatment for substance abuse?: No Suicide prevention information given to non-admitted patients: Not applicable  Risk to Others within the past 6 months Homicidal Ideation: No Does patient have any lifetime risk of violence toward others beyond the six months prior to admission? : No Thoughts of Harm to Others: No Current Homicidal Intent: No Current Homicidal Plan: No Access to Homicidal Means: No Identified Victim: No one History  of harm to others?: Yes Assessment of Violence: On admission Violent Behavior Description: Throwing chairs at facility Does patient have access to weapons?: No Criminal Charges Pending?: No Does patient have a court date: No Is patient on probation?: No  Psychosis Hallucinations: None noted Delusions: None noted  Mental Status Report Appearance/Hygiene: Unremarkable Eye Contact: Fair Motor Activity: Freedom of movement, Unremarkable Speech: Logical/coherent Level of Consciousness: Quiet/awake Mood: Pleasant Affect: Appropriate to circumstance Anxiety Level: None Thought Processes: Coherent Judgement: Impaired(Hx of dementia) Orientation: Not oriented Obsessive Compulsive Thoughts/Behaviors: None  Cognitive Functioning Concentration: Poor Memory: Recent Impaired, Remote Impaired Is patient IDD: No Insight: Poor Impulse Control: Poor Appetite: Good Have  you had any weight changes? : No Change Sleep: Unable to Assess Total Hours of Sleep: (Pt can't tell.) Vegetative Symptoms: None  ADLScreening [facility] Assessment Services) Patient's cognitive ability adequate to safely complete daily activities?: No Patient able to express need for assistance with ADLs?: Yes Independently performs ADLs?: Yes (appropriate for developmental age)  Prior Inpatient Therapy Prior Inpatient Therapy: No(Unknown)  Prior Outpatient Therapy Prior Outpatient Therapy: No(Unknown) Does patient have an ACCT team?: No Does patient have Intensive In-House Services?  : No Does patient have [facility] services? : No Does patient have P4CC services?: No  ADL Screening (condition at time of admission) Patient's cognitive ability adequate to safely complete daily activities?: No Is the patient deaf or have difficulty hearing?: Yes(Must raise voice sometimes.) Does the patient have difficulty seeing, even when wearing glasses/contacts?: No(Does use glasses.) Does the patient have difficulty concentrating, remembering, or making decisions?: Yes Patient able to express need for assistance with ADLs?: Yes Does the patient have difficulty dressing or bathing?: No Independently performs ADLs?: Yes (appropriate for developmental age) Does the patient have difficulty walking or climbing stairs?: No Weakness of Legs: None Weakness of Arms/Hands: None       Abuse/Neglect Assessment (Assessment to be complete while patient is alone) Abuse/Neglect Assessment Can Be Completed: Yes Physical Abuse: Denies Verbal Abuse: Denies Sexual Abuse: Denies Exploitation of patient/patient's resources: Denies Self-Neglect: Denies     Regulatory affairs officer (For Healthcare) Does Patient Have a Medical Advance Directive?: No Would patient like information on creating a medical advance directive?: No - Patient declined          Disposition:  Disposition Initial Assessment Completed for  this Encounter: Yes Patient referred to: Other (Comment)(To be reviewed w/ FNP)  On Site Evaluation by:   Reviewed with Physician:    Raymondo Band 03/19/2018 10:59 PM

## 2018-03-20 DIAGNOSIS — Z743 Need for continuous supervision: Secondary | ICD-10-CM | POA: Diagnosis not present

## 2018-03-20 DIAGNOSIS — R279 Unspecified lack of coordination: Secondary | ICD-10-CM | POA: Diagnosis not present

## 2018-03-20 DIAGNOSIS — R4182 Altered mental status, unspecified: Secondary | ICD-10-CM | POA: Diagnosis not present

## 2018-03-20 LAB — URINALYSIS, ROUTINE W REFLEX MICROSCOPIC
Bilirubin Urine: NEGATIVE
Glucose, UA: NEGATIVE mg/dL
Hgb urine dipstick: NEGATIVE
KETONES UR: 5 mg/dL — AB
Leukocytes,Ua: NEGATIVE
Nitrite: NEGATIVE
PROTEIN: NEGATIVE mg/dL
Specific Gravity, Urine: 1.027 (ref 1.005–1.030)
pH: 6 (ref 5.0–8.0)

## 2018-03-20 LAB — RAPID URINE DRUG SCREEN, HOSP PERFORMED
Amphetamines: NOT DETECTED
BARBITURATES: NOT DETECTED
Benzodiazepines: NOT DETECTED
Cocaine: NOT DETECTED
Opiates: NOT DETECTED
TETRAHYDROCANNABINOL: NOT DETECTED

## 2018-03-20 NOTE — Discharge Instructions (Addendum)
Jordan Long has been cooperative during his ED visit.  His urinalysis was normal.  His basic blood work did not show any acute abnormality.  Please discuss with his doctor to get instructions for medication orders when he has a behavior outburst.

## 2018-03-20 NOTE — ED Notes (Signed)
Attempted to contact son regarding father's discharge. No answer

## 2018-03-20 NOTE — ED Notes (Signed)
Spoke with lab, stated they would add UA on and complete it.

## 2018-03-20 NOTE — ED Provider Notes (Signed)
Patient was left at change of shift to get results of his urinalysis.  Dr. Tomi Bamberger felt like patient could go back to his nursing facility if it was normal, patient has had no abnormal behavior in the ED, he has been cooperative.  Urinalysis    Component Value Date/Time   COLORURINE YELLOW 03/19/2018 [place] 03/19/2018 2343   APPEARANCEUR Clear 03/07/2015 1433   LABSPEC 1.027 03/19/2018 2343   PHURINE 6.0 03/19/2018 [place] 03/19/2018 [place] 03/19/2018 [place] 03/19/2018 2343   BILIRUBINUR neg 06/27/2015 1439   BILIRUBINUR Negative 03/07/2015 1433   KETONESUR 5 (A) 03/19/2018 2343   PROTEINUR NEGATIVE 03/19/2018 2343   UROBILINOGEN 0.2 06/27/2015 1439   UROBILINOGEN 0.2 11/02/2008 1333   NITRITE NEGATIVE 03/19/2018 [place] 03/19/2018 2343   Laboratory interpretation all normal    Rolland Porter, MD 03/20/18 [id number]

## 2018-03-20 NOTE — ED Notes (Signed)
PTAR at bedside 

## 2018-03-20 NOTE — ED Notes (Signed)
Son was informed that pt is being discharged and being transported by [facility].

## 2018-03-20 NOTE — ED Notes (Signed)
[facility] contacted to take pt to [facility]/ [place]. Paperwork printed

## 2018-03-20 NOTE — ED Notes (Addendum)
Report given to Charlena Cross, Therapist, sports at [place] at [facility] Rn stated that per supervisor the ED is to keep the pt for 24 hours. RN explained that he has been cleared by the doctor and TTS and he does not meet the criteria for admission. Pt has been calm and cooperative towards all staff members during his visit. RN at facility stated "I was told if he comes back to send him back out and I will call my supervisor."    Charge Rn aware

## 2018-03-22 ENCOUNTER — Encounter: Payer: Self-pay | Admitting: Internal Medicine

## 2018-03-22 ENCOUNTER — Ambulatory Visit (INDEPENDENT_AMBULATORY_CARE_PROVIDER_SITE_OTHER): Payer: Medicare HMO | Admitting: Internal Medicine

## 2018-03-22 ENCOUNTER — Other Ambulatory Visit: Payer: Self-pay

## 2018-03-22 DIAGNOSIS — Z7901 Long term (current) use of anticoagulants: Secondary | ICD-10-CM | POA: Diagnosis not present

## 2018-03-22 DIAGNOSIS — F411 Generalized anxiety disorder: Secondary | ICD-10-CM | POA: Diagnosis not present

## 2018-03-22 DIAGNOSIS — R51 Headache: Secondary | ICD-10-CM | POA: Diagnosis not present

## 2018-03-22 DIAGNOSIS — G309 Alzheimer's disease, unspecified: Secondary | ICD-10-CM

## 2018-03-22 DIAGNOSIS — R519 Headache, unspecified: Secondary | ICD-10-CM

## 2018-03-22 DIAGNOSIS — F0281 Dementia in other diseases classified elsewhere with behavioral disturbance: Secondary | ICD-10-CM

## 2018-03-22 NOTE — Patient Instructions (Signed)
I will call you with the results of your head CT.
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
## 2018-03-23 DIAGNOSIS — R519 Headache, unspecified: Secondary | ICD-10-CM | POA: Insufficient documentation

## 2018-03-23 DIAGNOSIS — R51 Headache: Secondary | ICD-10-CM

## 2018-03-23 NOTE — Progress Notes (Addendum)
Internal Medicine Clinic Attending  I saw and evaluated the patient.  I personally confirmed the key portions of the history and exam documented by Dr. Alfonse Spruce and I reviewed pertinent patient test results.  The assessment, diagnosis, and plan were formulated together and I agree with the documentation in the resident's note.  To clarify, the patient is a 68 year old person living with dementia here with new intermittent headaches, and one episode of personality change/agitation. Neuro exam is normal today. Given anticoagulation, new headache in an older person, possibility of falls/trauma without memory, and personality change, I do want a CT head to rule out a subdural hematoma.

## 2018-03-23 NOTE — Assessment & Plan Note (Signed)
Assessment: Mr. Line presents today with a 80-month history of intermittent headaches and multiple danger signs including personality changes (agitation) and age >39. I am concerned that these symptoms may be due to a intracerebral bleed 2/2 long-term anti-coagulation use. Other diagnosis which should be considered but less likely includes intracranial malignancy. Will plan on ordering a head CT to evaluate for hemorrhage.   Plan: 1. Ordered head CT w/out contrast

## 2018-04-01 ENCOUNTER — Ambulatory Visit (HOSPITAL_COMMUNITY)
Admission: RE | Admit: 2018-04-01 | Discharge: 2018-04-01 | Disposition: A | Discharge status: Home / Self Care | Payer: Medicare HMO | Source: Ambulatory Visit | Attending: Internal Medicine | Admitting: Internal Medicine

## 2018-04-01 DIAGNOSIS — R519 Headache, unspecified: Secondary | ICD-10-CM

## 2018-04-01 DIAGNOSIS — R51 Headache: Secondary | ICD-10-CM | POA: Insufficient documentation

## 2018-04-04 ENCOUNTER — Telehealth: Payer: Self-pay | Admitting: Student in an Organized Health Care Education/Training Program

## 2018-04-04 NOTE — Telephone Encounter (Signed)
Pls call regarding results; pt [phone]

## 2018-04-04 NOTE — Telephone Encounter (Signed)
Done. Discussed results of CT head with patient's sister.

## 2018-04-05 ENCOUNTER — Telehealth: Payer: Self-pay

## 2018-04-05 NOTE — Telephone Encounter (Signed)
Pt's daugh in law calls and has questions about CT, could you please call her at [phone]- [name] Bascomb. I spoke to sue and she ask if dr Evette Doffing would call [name]

## 2018-04-05 NOTE — Telephone Encounter (Signed)
Pt's daughter in-law requesting CT test result. Please call back.

## 2018-04-07 NOTE — Telephone Encounter (Signed)
Discussed with [name] the progression of Jordan Long's dementia. One episode of behavior outburst is concerning, may herald a worsening in his inhibition and functioning. I advised monitoring, he has been doing well at [facility] otherwise. If more behavior problems or safety concerns, they have a memory unit that he can escalate to. She understands, family is in agreement.

## 2018-05-02 ENCOUNTER — Telehealth: Payer: Self-pay | Admitting: *Deleted

## 2018-05-02 NOTE — Telephone Encounter (Signed)
Aletha with [facility] called to report patient c/o painful right arm x 2 days. Today arm is swollen and painful to touch. Patient with alzheimer's; he does not know if he fell or injured it in some way. Asked if there was good capillary refill on right, Aletha did not know what that meant. She is requesting order to x-ray right arm be faxed to [facility] at [phone]. Hubbard Hartshorn, RN, BSN

## 2018-05-02 NOTE — Telephone Encounter (Signed)
I think this issue will need an inperson evaluation to sort out. The arm may be swollen for many reasons like cellulitis, DVT, etc. Can we get him an ACC appt for today or tomorrow?

## 2018-05-02 NOTE — Telephone Encounter (Signed)
Patient's sister made aware that patient has appt to be seen in clinic tomorrow. She states that patient is in a lot of pain and he normally does not c/o pain. States he was unable to sleep last evening 2/2 pain. Wonders if there is anything patient can take other than tylenol 500 mg every six hours. States he may have to go to ED if pain doesn't improve. Hubbard Hartshorn, RN, BSN

## 2018-05-02 NOTE — Telephone Encounter (Addendum)
Spoke with Katharine Look with [facility]. Offered AM appt in [facility] but she preferred appt at 2:15. Hubbard Hartshorn, RN, BSN

## 2018-05-03 ENCOUNTER — Emergency Department (HOSPITAL_COMMUNITY)
Admission: EM | Admit: 2018-05-03 | Discharge: 2018-05-03 | Payer: Medicare HMO | Attending: Emergency Medicine | Admitting: Emergency Medicine

## 2018-05-03 ENCOUNTER — Ambulatory Visit (INDEPENDENT_AMBULATORY_CARE_PROVIDER_SITE_OTHER): Payer: Medicare HMO | Admitting: Internal Medicine

## 2018-05-03 ENCOUNTER — Other Ambulatory Visit (HOSPITAL_COMMUNITY): Payer: Self-pay | Admitting: Internal Medicine

## 2018-05-03 ENCOUNTER — Encounter (HOSPITAL_COMMUNITY): Payer: Self-pay | Admitting: Emergency Medicine

## 2018-05-03 ENCOUNTER — Ambulatory Visit (HOSPITAL_COMMUNITY)
Admission: RE | Admit: 2018-05-03 | Discharge: 2018-05-03 | Disposition: A | Discharge status: Home / Self Care | Payer: Medicare HMO | Source: Ambulatory Visit | Attending: Student in an Organized Health Care Education/Training Program | Admitting: Student in an Organized Health Care Education/Training Program

## 2018-05-03 ENCOUNTER — Encounter: Payer: Self-pay | Admitting: Student in an Organized Health Care Education/Training Program

## 2018-05-03 ENCOUNTER — Encounter: Payer: Self-pay | Admitting: Internal Medicine

## 2018-05-03 ENCOUNTER — Other Ambulatory Visit: Payer: Self-pay

## 2018-05-03 VITALS — BP 100/69 | HR 92 | Temp 98.3°F

## 2018-05-03 DIAGNOSIS — Z87891 Personal history of nicotine dependence: Secondary | ICD-10-CM | POA: Diagnosis not present

## 2018-05-03 DIAGNOSIS — Y939 Activity, unspecified: Secondary | ICD-10-CM | POA: Diagnosis not present

## 2018-05-03 DIAGNOSIS — S4991XA Unspecified injury of right shoulder and upper arm, initial encounter: Secondary | ICD-10-CM | POA: Diagnosis present

## 2018-05-03 DIAGNOSIS — G309 Alzheimer's disease, unspecified: Secondary | ICD-10-CM | POA: Insufficient documentation

## 2018-05-03 DIAGNOSIS — F0390 Unspecified dementia without behavioral disturbance: Secondary | ICD-10-CM | POA: Insufficient documentation

## 2018-05-03 DIAGNOSIS — X58XXXA Exposure to other specified factors, initial encounter: Secondary | ICD-10-CM | POA: Diagnosis not present

## 2018-05-03 DIAGNOSIS — M25411 Effusion, right shoulder: Secondary | ICD-10-CM | POA: Diagnosis not present

## 2018-05-03 DIAGNOSIS — Y929 Unspecified place or not applicable: Secondary | ICD-10-CM | POA: Diagnosis not present

## 2018-05-03 DIAGNOSIS — Z86718 Personal history of other venous thrombosis and embolism: Secondary | ICD-10-CM

## 2018-05-03 DIAGNOSIS — M2500 Hemarthrosis, unspecified joint: Secondary | ICD-10-CM | POA: Insufficient documentation

## 2018-05-03 DIAGNOSIS — Y999 Unspecified external cause status: Secondary | ICD-10-CM | POA: Insufficient documentation

## 2018-05-03 DIAGNOSIS — Z7901 Long term (current) use of anticoagulants: Secondary | ICD-10-CM | POA: Diagnosis not present

## 2018-05-03 DIAGNOSIS — Z79899 Other long term (current) drug therapy: Secondary | ICD-10-CM | POA: Diagnosis not present

## 2018-05-03 DIAGNOSIS — C4491 Basal cell carcinoma of skin, unspecified: Secondary | ICD-10-CM | POA: Diagnosis not present

## 2018-05-03 DIAGNOSIS — R52 Pain, unspecified: Secondary | ICD-10-CM

## 2018-05-03 DIAGNOSIS — Z86711 Personal history of pulmonary embolism: Secondary | ICD-10-CM | POA: Diagnosis not present

## 2018-05-03 DIAGNOSIS — D6859 Other primary thrombophilia: Secondary | ICD-10-CM | POA: Diagnosis not present

## 2018-05-03 DIAGNOSIS — S72341A Displaced spiral fracture of shaft of right femur, initial encounter for closed fracture: Secondary | ICD-10-CM | POA: Diagnosis not present

## 2018-05-03 DIAGNOSIS — S42341A Displaced spiral fracture of shaft of humerus, right arm, initial encounter for closed fracture: Secondary | ICD-10-CM

## 2018-05-03 DIAGNOSIS — S59911A Unspecified injury of right forearm, initial encounter: Secondary | ICD-10-CM | POA: Diagnosis not present

## 2018-05-03 LAB — SYNOVIAL CELL COUNT + DIFF, W/ CRYSTALS
Crystals, Fluid: NONE SEEN
Eosinophils-Synovial: 0 % (ref 0–1)
Lymphocytes-Synovial Fld: 15 % (ref 0–20)
Monocyte-Macrophage-Synovial Fluid: 13 % — ABNORMAL LOW (ref 50–90)
Neutrophil, Synovial: 73 % — ABNORMAL HIGH (ref 0–25)
Other Cells-SYN: 0
WBC, Synovial: 1278 /mm3 — ABNORMAL HIGH (ref 0–200)

## 2018-05-03 MED ORDER — DOCUSATE SODIUM 100 MG PO CAPS: 100.0000 mg | ORAL_CAPSULE | Freq: Two times a day (BID) | ORAL | 0 refills | Status: DC

## 2018-05-03 MED ORDER — OXYCODONE-ACETAMINOPHEN 5-325 MG PO TABS: 1.0000 | ORAL_TABLET | ORAL | 0 refills | Status: DC | PRN

## 2018-05-03 MED ORDER — HYDROMORPHONE HCL 1 MG/ML IJ SOLN
1.0000 mg | Freq: Once | INTRAMUSCULAR | Status: AC
  Administered 2018-05-03: 1 mg via INTRAMUSCULAR
  Filled 2018-05-03: qty 1

## 2018-05-03 NOTE — Progress Notes (Signed)
   CC: right shoulder pain  HPI:  [name] is a 68 y.o. with PMH as below presenting with right shoulder pain. He resides at a memory care facility and some of the history was provided by one of the staff from the facility. He is unsure what happened and only remembers that his arm has been hurting for some time. Staff states that he was normal during her shift two days ago, walking and independent, which his normal baseline. She was not at work yesterday but was told that his symptoms appeared to start then as he did not seem to want to get out of bed. It was noted today he has right arm swelling and could not move his arm. He states trying to move his arm is very painful but there is not too much pain at rest. He is not sure how long the swelling has been present. He does not have any numbness or tingling in his hands. They are not sure if he had a fall or injured his arm. He takes xarelto and last took his medication this morning.   Please see A&P for assessment of the patient's acute and chronic medical conditions.   Past Medical History:  Diagnosis Date  . DVT (deep venous thrombosis) ([place]) 2010 and 10/2010   after Hernia repair in 2010 s/p IVC filter and coumadin. Next episode after Hernia repair  ( 10/23/2010) . Admitted on 11/02/2010 for repeat DVT. Warfarin was stopped for  5 days before surgery.   Marland Kitchen DVT (deep venous thrombosis) ([place]) 10/2011   right leg after knee surgery  . GERD (gastroesophageal reflux disease)   . Incisional hernia 08/20/2010  . Pulmonary embolism ([place]) 2010   Review of Systems:   ROS negative except as noted in HPI.   Physical Exam:  Constitution: NAD, limited short term memory, pleasant HENT: left eyebrow with old almost healed hematoma, otherwise AT/[place] Respiratory: CTA, no w/r/r  MSK: right extremity colder than left, radial pulses equal  NTTP clavicular area, no palpable fracture or bony abnormality left shoulder NTTP with normal ROM   Right shoulder: TTP upper arm, pain with any passive ROM, unable to actively perform ROM of arm, induration and swelling to the elbow, sensation intact, active ROM fingers intact, shoulder at rest is below level of left shoulder, and shows decreased shoulder shrug Neuro: alert, limited short term memory, normal affect Skin: induration to anterior right bicep with minimal discoloration   Vitals:   05/03/18 1449 05/03/18 1501  BP: 100/68 100/69  Pulse: 95 92  Temp: 98.3 F (36.8 C) 98.3 F (36.8 C)  TempSrc:  Oral  SpO2:  99%     Assessment & Plan:   See Encounters Tab for problem based charting.  Patient seen with Dr. Evette Doffing
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
## 2018-05-03 NOTE — ED Notes (Signed)
Family member called requesting several things.  Santiago Glad (niece) would like to be informed of plan as well as possible consult with Raliegh Ip who the pt sees.  Sister can also be notified.   Karen(neice) [phone] [name] ( sister) [phone]  Raliegh Ip [phone]

## 2018-05-03 NOTE — Assessment & Plan Note (Signed)
He resides at a memory care facility and some of the history was provided by one of the staff from the facility. He is unsure what happened and only remembers that his arm has been hurting for some time. Staff states that he was normal during her shift two days ago, walking and independent, which his normal baseline. She was not at work yesterday but was told that his symptoms appeared to start then as he did not seem to want to get out of bed. It was noted today he has right arm swelling and could not move his arm. He states trying to move his arm is very painful. He is not sure how long the swelling has been present. He does not have any numbness or tingling in his hands. They are not sure if he had a fall or injured his arm. He takes xarelto and last took his medication this morning.   He is unable to lift or move his arm and has extreme pain with palpation of the humeral area. With induration to the biceps and joint effusion seen within the shoulder on [place], arthrocentesis was performed which showed frank blood within the joint. He is being sent to the ED for xrays and further evaluation of hemarthrosis, possible fracture, and joint dislocation.

## 2018-05-03 NOTE — ED Notes (Signed)
Ortho at bedside.

## 2018-05-03 NOTE — Progress Notes (Signed)
After consent was obtained, using sterile technique the right shoulder was prepped and plain Lidocaine 1% was used as local anesthetic. The right anterior subacromial joint was entered and 5 ml's of frank blood was withdrawn and sent for cell count and culture.  The procedure was well tolerated. The patient is being sent to the ED for further workup and x-rays with concern for hemarthrosis. Watch for fever, or increased swelling or persistent pain in the joint.

## 2018-05-03 NOTE — Discharge Instructions (Addendum)
Jordan Long has a severe arm injury which is undoubtedly extremely painful. With his dementia he does not communicate this well. His pain level needs to be assessed frequently and be given pain medicine as needed.   He needs to see the orthopedic surgeon this week.
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
## 2018-05-03 NOTE — Progress Notes (Signed)
Internal Medicine Clinic Attending  I saw and evaluated the patient.  I personally confirmed the key portions of the history and exam documented by Dr. Sharon Seller and I reviewed pertinent patient test results.  The assessment, diagnosis, and plan were formulated together and I agree with the documentation in the resident's note.  68 year old person living with dementia and a hypercoagulable state treated with chronic apixaban, here with 2 days of acute right shoulder pain. He cannot give history due to dementia. The CNA accompanying him from [facility] memory unit tells me he was normal on Sunday, sudden pain on Monday, no fevers or other symptoms. There is no known fall or trauma.   On my exam, he appears in severe pain, the shoulder cannot be moved due to pain. There is a large right shoulder joint effusion. There is soft tissue swelling and ecchymosis down the medial right arm to the elbow, small effusion in the right olecranon bursa. The hand has normal sensation, it is warm, there is a normal radial pulse, no parasthesia.   I spoke with his sister, Ledora Bottcher over the phone, she is his local Air traffic controller. She gave [place] consent to do a diagnostic arthrocentesis as I wanted to eval for septic arthritis vs. Hemarthrosis. We completed the procedure, sampled 21ml of fluid from the anterior subacromial space which was frank blood.   I think the diagnosis is a hemarthrosis. Could be spontaneous due to anticoagulation, last dose of apixaban was this morning. Maybe he had a trauma that he does not remember. We will order xray of the right shoulder. Will send him to the ED for orthopedics consultation about next best steps in management.  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 He has been on anticoagulation without bleeding side effect for many years, I have treated him since 2017. We will have to see how this issue develops before making  decision about future risks and benefits of continuing anticoagulation.

## 2018-05-04 DIAGNOSIS — S42294A Other nondisplaced fracture of upper end of right humerus, initial encounter for closed fracture: Secondary | ICD-10-CM | POA: Diagnosis not present

## 2018-05-06 ENCOUNTER — Telehealth: Payer: Self-pay | Admitting: Student in an Organized Health Care Education/Training Program

## 2018-05-06 ENCOUNTER — Encounter: Payer: Self-pay | Admitting: Oncology

## 2018-05-06 LAB — BODY FLUID CULTURE: Culture: NO GROWTH

## 2018-05-06 NOTE — Progress Notes (Signed)
Medicine attending: Phone conversation with Amber at [facility] re-patient Jordan Long.  68 year old man with Alzheimer's dementia.  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  Recent evaluation in our clinic on March 31 for right shoulder pain after a fall.  Sent to the ED.  X-ray showed a spiral fracture of the humerus.  Outpatient orthopedic consultation arranged. Facility called to report patient's sister states that the patient had not voided for the last 24 hours.  No known history of urologic problems.  Recent BUN and creatinine 25 and 1.3 on March 19, 2018.  No change from May 2019 when BUN 27 and creatinine 1.2. He was given Percocet to control the pain from the humerus fracture.  I advised the facility staff that this could cause urinary retention. They stated that he does have a sitter and that he has been taking p.o. fluids.   They are not able to do an in and out catheter or Foley catheter.  I advised transport to the emergency department at [facility] for further evaluation.

## 2018-05-06 NOTE — Telephone Encounter (Signed)
Pt's Sister called to report the patient did not urinate last night and all day today. She is concerned that  The weekend is coming and this will not be resolved and would like a call back as soon as possible.   Pt is at the [facility] with his Sitter [facility]".  Pt sister is requesting a nurse to call the sitter @ [phone] for more details. If the sitter does not answer you can call the Facility @ [phone].

## 2018-05-06 NOTE — Telephone Encounter (Signed)
I called facility & advised. Note put in chart.

## 2018-05-06 NOTE — Telephone Encounter (Addendum)
Call from Lelan Pons, med tech/supervisor at [facility]. Stated pt has not voided x 24 hrs and requesting orders. Her phone # [phone] til 1530 PM then call Amber. Thanks

## 2018-05-09 ENCOUNTER — Other Ambulatory Visit: Payer: Self-pay | Admitting: Student in an Organized Health Care Education/Training Program

## 2018-05-09 NOTE — Telephone Encounter (Signed)
[facility]  calling to requesting a Medication Refill  To [facility] [phone]  Fax Number [phone] instead of [facility]  oxyCODONE-acetaminophen (PERCOCET/ROXICET) 5-325 MG tablet

## 2018-05-09 NOTE — Telephone Encounter (Signed)
Per notes he lives at  [facility] unit. The ED Rx'd the oxycodone. He should have a physician at the facility to assess med need.

## 2018-05-11 DIAGNOSIS — S42294D Other nondisplaced fracture of upper end of right humerus, subsequent encounter for fracture with routine healing: Secondary | ICD-10-CM | POA: Diagnosis not present

## 2018-05-11 NOTE — Telephone Encounter (Signed)
Per chart, pt has dementia. Added to his age, high risk for delirium with opioids. But high risk for delirium with pain. So ask them to schedule tylenol 1000 mg PO QID for one week then call back. Can call back sooner if pain uncontrolled on tylenol - would sch telehealth appt on [facility] sch but would need caregiver part of call.

## 2018-05-11 NOTE — Telephone Encounter (Signed)
Spoke with Ebony at [facility]. States they do not have anyone on site who can assess patient's pain med need; no physicians on site. Per Charlena Cross, patient states he's "fine" but if his arm is touched or moved, he screams. Ebony states swelling has decreased "a little bit." L. Silvano Rusk, RN, BSN

## 2018-05-11 NOTE — Telephone Encounter (Signed)
let me know if I need to write for acetaminophen.

## 2018-05-11 NOTE — Telephone Encounter (Signed)
Call placed to [facility]. Explained info below from Dr. Lynnae January. She then transferred call to her manager, Estill Bamberg, [employer] (they currently have no nurses at facility). Estill Bamberg states tylenol did not work to control pain in the 2 days prior to appt on 05/03/2018 where spiral fx of right humerus was found. Patient is currently at a doctor's office having cast placed on right arm. Estill Bamberg will call back to let us know if that doctor prescribes pain med. If not, Estill Bamberg will need a written order for scheduled tylenol. Hubbard Hartshorn, RN, BSN

## 2018-05-19 ENCOUNTER — Telehealth: Payer: Self-pay | Admitting: *Deleted

## 2018-05-19 ENCOUNTER — Other Ambulatory Visit: Payer: Self-pay | Admitting: *Deleted

## 2018-05-19 MED ORDER — DOCUSATE SODIUM 100 MG PO CAPS: 100.0000 mg | ORAL_CAPSULE | Freq: Two times a day (BID) | ORAL | 5 refills | Status: DC | PRN

## 2018-05-19 NOTE — Telephone Encounter (Signed)
Nothing to do at this time. Call if recurrent symptoms.

## 2018-05-19 NOTE — Telephone Encounter (Signed)
Attempted to relay  message to staff, Jordan Long at [facility] Unable to speak to someone, cutoff twice

## 2018-05-19 NOTE — Telephone Encounter (Signed)
rec'd fax from facility, rtc, state pt stated he didn't feel good this am. Stated he ate a strawberry and "threw up". She states his vs were taken at 0900 T oral 98, HR 82, R 24, BP 141/61 Lunch pt had green beans and chicken, ate appr 1/4 of meal. No more vomiting. VS at this time are oral T 97.4 hr 58 resp 22 bp 113/74 pt is resting in recliner and has no complaints.please advise

## 2018-05-23 ENCOUNTER — Telehealth: Payer: Self-pay | Admitting: *Deleted

## 2018-05-23 ENCOUNTER — Other Ambulatory Visit: Payer: Self-pay | Admitting: Oncology

## 2018-05-23 MED ORDER — ONDANSETRON 4 MG PO TBDP: 4.0000 mg | ORAL_TABLET | Freq: Three times a day (TID) | ORAL | 2 refills | Status: DC | PRN

## 2018-05-23 NOTE — Telephone Encounter (Signed)
Send rx to [facility] per Lelan Pons at [facility].

## 2018-05-23 NOTE — Telephone Encounter (Signed)
Call from [facility] [place] assisted living - requesting a rx for nausea; stated pt has not had any since 4/16 when they called but requesting PRN order. Thanks

## 2018-05-23 NOTE — Telephone Encounter (Signed)
What pharmacy is he using? I have 3 listed in chart. I will send in Rx for Zofran odt

## 2018-05-23 NOTE — Telephone Encounter (Signed)
done

## 2018-05-25 DIAGNOSIS — S42294D Other nondisplaced fracture of upper end of right humerus, subsequent encounter for fracture with routine healing: Secondary | ICD-10-CM | POA: Diagnosis not present

## 2018-05-27 ENCOUNTER — Telehealth: Payer: Self-pay | Admitting: *Deleted

## 2018-05-27 NOTE — Telephone Encounter (Signed)
Called sister, who is caretaker, she will take care getting medicine this weekend, she is going to try video chatting with him so she can see the rash and then speak with pharmacist

## 2018-05-27 NOTE — Telephone Encounter (Signed)
Sounds like a chronic problem. Nothing we can do by phone. Need to see the rash. Either schedule visit here next week or see if pt has a Paediatric nurse and if so, refer to that person.

## 2018-05-27 NOTE — Telephone Encounter (Signed)
From: marie at facility [phone] Pt has a rash on his face and head, picking at it and it bleeds, please advise

## 2018-05-30 NOTE — Telephone Encounter (Signed)
Thank you :)

## 2018-06-06 ENCOUNTER — Telehealth: Payer: Self-pay | Admitting: *Deleted

## 2018-06-06 NOTE — Telephone Encounter (Signed)
Noted [facility] [place] appointment for tomorrow; nothing to do today. Chronic issue.

## 2018-06-06 NOTE — Telephone Encounter (Signed)
Care facility calls back about rash, per dr Azucena Freed note, scheduled [facility] for 5/5 at 0930.

## 2018-06-07 ENCOUNTER — Ambulatory Visit (INDEPENDENT_AMBULATORY_CARE_PROVIDER_SITE_OTHER): Payer: Medicare HMO | Admitting: Internal Medicine

## 2018-06-07 VITALS — BP 110/68 | HR 63 | Temp 98.1°F | Wt 172.8 lb

## 2018-06-07 DIAGNOSIS — R21 Rash and other nonspecific skin eruption: Secondary | ICD-10-CM | POA: Diagnosis not present

## 2018-06-07 DIAGNOSIS — F0390 Unspecified dementia without behavioral disturbance: Secondary | ICD-10-CM

## 2018-06-07 MED ORDER — VALACYCLOVIR HCL 500 MG PO TABS: 500.0000 mg | ORAL_TABLET | Freq: Two times a day (BID) | ORAL | 0 refills | Status: AC

## 2018-06-07 NOTE — Progress Notes (Signed)
   CC: Rash.  HPI:  [name] is a 68 y.o. with past medical history as listed below was brought to the clinic from his skilled nursing facility with a nursing aide due to a rash over his forearms, back and bilateral shins for 2 weeks.  Patient has advanced dementia but able to communicate.  He was not sure for how long he is seeing this rash but stating not for very long.  He sees initial some blistering later developed scabs and scar.  Occasionally itches.  Per patient he keeps messing them up so he noticed mild bleeding when he scratches them. He is not aware if anyone else in the facility has similar symptoms. Denies any fever or chills or any other symptoms.  Past Medical History:  Diagnosis Date  . DVT (deep venous thrombosis) ([place]) 2010 and 10/2010   after Hernia repair in 2010 s/p IVC filter and coumadin. Next episode after Hernia repair  ( 10/23/2010) . Admitted on 11/02/2010 for repeat DVT. Warfarin was stopped for  5 days before surgery.   Marland Kitchen DVT (deep venous thrombosis) ([place]) 10/2011   right leg after knee surgery  . GERD (gastroesophageal reflux disease)   . Incisional hernia 08/20/2010  . Pulmonary embolism ([place]) 2010   Review of Systems: Negative except mentioned in HPI.  Physical Exam:  Vitals:   06/07/18 0948  BP: 110/68  Pulse: 63  Temp: 98.1 F (36.7 C)  TempSrc: Oral  SpO2: 100%  Weight: 172 lb 12.8 oz (78.4 kg)   Vitals:   06/07/18 0948  BP: 110/68  Pulse: 63  Temp: 98.1 F (36.7 C)  TempSrc: Oral  SpO2: 100%  Weight: 172 lb 12.8 oz (78.4 kg)   General: Vital signs reviewed.  Patient is well-developed and well-nourished, in no acute distress and cooperative with exam.  Head: Normocephalic and atraumatic. Eyes: EOMI, conjunctivae normal, no scleral icterus.  Cardiovascular: RRR, S1 normal, S2 normal, no murmurs, gallops, or rubs. Pulmonary/Chest: Clear to auscultation bilaterally, no wheezes, rales, or rhonchi. Abdominal: Soft,  non-tender, non-distended, BS +, Musculoskeletal: Right upper extremity with brace due to a humeral fracture. Extremities: No lower extremity edema bilaterally,  pulses symmetric and intact bilaterally. No cyanosis or clubbing. Skin: Multiple areas of scabs on his bilateral forearms, upper and lower back, scalp and side of face and few noted on bilateral shins.  No recent lesion or blister. Psychiatric: Normal mood and affect. speech and behavior is normal. Cognition and memory are normal.     Assessment & Plan:   See Encounters Tab for problem based charting.  Patient seen with Dr. Beryle Beams.

## 2018-06-07 NOTE — Progress Notes (Signed)
Medicine attending: I personally interviewed and briefly examined this patient on the day of the patient visit and reviewed pertinent clinical  data  with resident physician Dr. Lorella Nimrod  and we discussed a management plan. Rash x 2-3 weeks, scalp, arms, back. Non pruritic. Resides in nursing facility, has dementia. No one else in facility affected. He has not been outside. Small, round, lesions w overlying scabs. Pt does state new lesions start out as blisters. I am concerned w Herpes Zoster. We will start empiric trial of Valtrex.

## 2018-06-07 NOTE — Patient Instructions (Signed)
Thank you for visiting clinic today. You might be having a viral infection called herpes. I am giving you a 7-day course of Valtrex you will take it twice daily. If your rash did not improve or get worse let us know so we can send you to see a dermatologist.

## 2018-06-07 NOTE — Assessment & Plan Note (Signed)
Nonspecific rash with no active lesion.  Most likely viral, may be due to herpes. No complaint of itchiness-so bug bites less likely. No excoriation marks on finger webs.  -Given a course of Valtrex 500 mg twice daily for 7 days. -If his symptoms does not improve or get worse he will need an appointment with dermatologist or a skin biopsy.

## 2018-06-10 ENCOUNTER — Other Ambulatory Visit: Payer: Self-pay | Admitting: *Deleted

## 2018-06-10 NOTE — Telephone Encounter (Signed)
This was ordered by the Emergency Department physician and has never been ordered by Dr. Evette Doffing.  Would need to evaluate the patient prior to a refill.  If not already scheduled, can you get him in with Dr. Evette Doffing in June?  Or an [facility] appointment if possible?  Thanks!

## 2018-06-10 NOTE — Telephone Encounter (Signed)
Call from [facility] - requesting a refill on Oxycodone; sent to [place].

## 2018-06-16 ENCOUNTER — Telehealth: Payer: Self-pay | Admitting: Student in an Organized Health Care Education/Training Program

## 2018-06-16 NOTE — Telephone Encounter (Signed)
Per Beverlee Nims from [facility] [phone]; DC order for pain medicine

## 2018-06-16 NOTE — Telephone Encounter (Signed)
Eliot Ford, med tech - requesting Oxy to be discontinued. Informed it was ordered when pt went to the ER (see telephone encounter 5/8).

## 2018-06-29 DIAGNOSIS — S42294D Other nondisplaced fracture of upper end of right humerus, subsequent encounter for fracture with routine healing: Secondary | ICD-10-CM | POA: Diagnosis not present

## 2018-07-04 ENCOUNTER — Ambulatory Visit: Payer: Medicare HMO

## 2018-07-19 ENCOUNTER — Telehealth: Payer: Self-pay | Admitting: *Deleted
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
## 2018-07-19 NOTE — Telephone Encounter (Signed)
I would recommend follow up appointment.  He was due to be seen in June, needs to be scheduled, preferably with Evette Doffing, for change in behavior.  Thanks
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
## 2018-07-20 NOTE — Telephone Encounter (Signed)
Great. I spoke with [name] today, daughter in law. They are working through the process to transfer Jordan Long to the memory unit at [facility] soon. I signed an order for it in the clinic. I am happy to sign an FL2 if they need it, [name] is working on that.

## 2018-07-20 NOTE — Telephone Encounter (Signed)
Marie at [facility] is unavailable today. Spoke with Beverlee Nims, Med Tech at [facility] and explained that PCP does not suggest visit to [facility] or U/A. He is in communication with patient's family regarding moving patient to Memory unit at [facility] and they should be hearing from family soon. Beverlee Nims states understanding. Hubbard Hartshorn, RN, BSN

## 2018-07-22 NOTE — Telephone Encounter (Signed)
CMA faxed updated FL-2  to [facility] on 07/21/18.Regenia Skeeter, Sybol Morre Cassady6/19/20209:08 AM

## 2018-07-27 DIAGNOSIS — S42294D Other nondisplaced fracture of upper end of right humerus, subsequent encounter for fracture with routine healing: Secondary | ICD-10-CM | POA: Diagnosis not present

## 2018-08-04 ENCOUNTER — Telehealth: Payer: Self-pay | Admitting: *Deleted

## 2018-08-04 ENCOUNTER — Telehealth: Payer: Self-pay | Admitting: Student in an Organized Health Care Education/Training Program

## 2018-08-04 DIAGNOSIS — L299 Pruritus, unspecified: Secondary | ICD-10-CM | POA: Insufficient documentation

## 2018-08-04 MED ORDER — RISPERIDONE 0.25 MG PO TABS: 0.2500 mg | ORAL_TABLET | Freq: Every day | ORAL | 2 refills | Status: DC

## 2018-08-04 NOTE — Telephone Encounter (Signed)
Please refer to phone note started today by PCP. Hubbard Hartshorn, RN, BSN

## 2018-08-04 NOTE — Telephone Encounter (Signed)
Received a note from [place] that he is having severe pruritus with skin erosions. Likely a complication of worsening dementia. I am starting risperidone 0.25mg  nightly given his underlying behavior disturbance.   I ordered future labs, CBC and CMP. Can we get him in asap for a lab visit. Then can we set him up to see me in my continuity clinic please within a few weeks?  Thanks.

## 2018-08-04 NOTE — Telephone Encounter (Addendum)
Relayed info from PCP to Lelan Pons with [place] at [facility]. She states she will have to talk to a few people before she is able to schedule appt for labs and appt with PCP. Faxed orders to Lelan Pons at [phone]. Hubbard Hartshorn, RN, BSN

## 2018-08-10 ENCOUNTER — Telehealth: Payer: Self-pay | Admitting: *Deleted

## 2018-08-10 ENCOUNTER — Telehealth: Payer: Self-pay | Admitting: Student in an Organized Health Care Education/Training Program

## 2018-08-10 NOTE — Telephone Encounter (Signed)
Please rtn call.
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
## 2018-08-10 NOTE — Telephone Encounter (Signed)
Ok. Thanks. I will be there.

## 2018-08-10 NOTE — Telephone Encounter (Signed)
Rec'd call from the patient daughter in law Caryl Asp) asking for a phone call back about the patient's medication.  She can be reached @ [phone].

## 2018-08-10 NOTE — Telephone Encounter (Signed)
I spoke with [name] today. We will have to arrange this through [facility]. They do all his transportation at this point. Can we call the facility please?

## 2018-08-10 NOTE — Telephone Encounter (Signed)
Attempted to contact Jordan Long to schedule patient for an appt this week in [facility] per the request of Dr. Evette Doffing.  No answer and unable to leave a message.  Sending back to pcp and triage to make the aware.
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
## 2018-08-10 NOTE — Telephone Encounter (Signed)
Just spoke with Jordan Long at [facility] (phone number [phone]) transportation is not available to bring the patient in for an appt until next Thursday 08/18/2018.  Patient is scheduled for 08/18/2018 at 9:45 am in [facility].

## 2018-08-11 ENCOUNTER — Telehealth: Payer: Self-pay | Admitting: Student in an Organized Health Care Education/Training Program

## 2018-08-11 NOTE — Telephone Encounter (Signed)
Maria from [facility] would like to confirm blood work order; pls contact [phone]

## 2018-08-11 NOTE — Telephone Encounter (Signed)
rtc to facility, was placed on hold for 12:38 mins then someone disconnected the call will try later

## 2018-08-11 NOTE — Telephone Encounter (Signed)
I spoke with Jordan Long today. We talked about his dementia, scratching, and behavior which may have been a miscommunication. We are going to continue with trial of low dose risperidone. I will see [name] in person visit on 7/16 for follow up.

## 2018-08-18 ENCOUNTER — Encounter: Payer: Self-pay | Admitting: Internal Medicine

## 2018-08-18 ENCOUNTER — Other Ambulatory Visit: Payer: Self-pay

## 2018-08-18 ENCOUNTER — Ambulatory Visit (INDEPENDENT_AMBULATORY_CARE_PROVIDER_SITE_OTHER): Payer: Medicare HMO | Admitting: Internal Medicine

## 2018-08-18 ENCOUNTER — Telehealth: Payer: Self-pay | Admitting: Student in an Organized Health Care Education/Training Program

## 2018-08-18 VITALS — BP 119/86 | HR 72 | Temp 98.6°F | Ht 67.0 in | Wt 174.6 lb

## 2018-08-18 DIAGNOSIS — R21 Rash and other nonspecific skin eruption: Secondary | ICD-10-CM

## 2018-08-18 DIAGNOSIS — L299 Pruritus, unspecified: Secondary | ICD-10-CM

## 2018-08-18 DIAGNOSIS — F424 Excoriation (skin-picking) disorder: Secondary | ICD-10-CM | POA: Diagnosis not present

## 2018-08-18 DIAGNOSIS — F0390 Unspecified dementia without behavioral disturbance: Secondary | ICD-10-CM

## 2018-08-18 DIAGNOSIS — Z0000 Encounter for general adult medical examination without abnormal findings: Secondary | ICD-10-CM

## 2018-08-18 MED ORDER — MULTIVITAMIN ADULT PO CHEW: 1.0000 | CHEWABLE_TABLET | Freq: Every day | ORAL | 11 refills | Status: DC

## 2018-08-18 MED ORDER — CERAVE EX LOTN: TOPICAL_LOTION | CUTANEOUS | 1 refills | Status: DC

## 2018-08-18 NOTE — Telephone Encounter (Signed)
Pt sister is calling would for someone to call her to see how his visit went [phone]

## 2018-08-18 NOTE — Progress Notes (Signed)
   CC: picking at skin  HPI:  Jordan Long. is a 68 y.o. with PMH as below, including worsening dementia, presenting from his nursing facility for worsening of picking at his skin and possible rash. He is accompanied by an aide who provided some of the history. He recently moved to their memory unit as his dementia has been recently progressing more rapidly. She states that he had the scabs when he arrived but seems to be scratching more since he arrived around one month ago. He states that he is not sure why he is here today but is feeling well. He denies itchy skin but notes that he does have some lesions and has been picking at his skin. His skin is not itching currently. He and his aide both state that he has been eating, drinking, and sleeping well.   Please see A&P for assessment of the patient's acute and chronic medical conditions.   Past Medical History:  Diagnosis Date  . DVT (deep venous thrombosis) ([place]) 2010 and 10/2010   after Hernia repair in 2010 s/p IVC filter and coumadin. Next episode after Hernia repair  ( 10/23/2010) . Admitted on 11/02/2010 for repeat DVT. Warfarin was stopped for  5 days before surgery.   Marland Kitchen DVT (deep venous thrombosis) ([place]) 10/2011   right leg after knee surgery  . GERD (gastroesophageal reflux disease)   . Incisional hernia 08/20/2010  . Pulmonary embolism ([place]) 2010   Review of Systems:   Review of Systems  Constitutional: Negative for chills, fever and weight loss.  HENT: Negative for congestion and sore throat.   Respiratory: Negative for cough and shortness of breath.   Cardiovascular: Negative for leg swelling.  Gastrointestinal: Negative for abdominal pain, constipation, diarrhea and nausea.  Musculoskeletal: Negative.   Skin: Negative for itching and rash.  Neurological: Negative for dizziness, tingling, sensory change and focal weakness.  Psychiatric/Behavioral: Positive for memory loss.    Physical Exam:  Constitution:  NAD, appears stated age  HENT: [place]/AT  Cardio: rrr, no LE edema Respiratory: CTA, no w/r/r  Abdominal: soft, non-distended, NTTP  MSK: moving all extremities, full ROM Neuro: alert to place and self; pleasant, normal affect Skin: dry flaking skin arms, legs and upper back with scattered healing scabs.    Vitals:   08/18/18 1035  BP: 119/86  Pulse: 72  Temp: 98.6 F (37 C)  TempSrc: Oral  SpO2: 100%  Weight: 174 lb 9.6 oz (79.2 kg)  Height: 5\' 7"  (1.702 m)     Assessment & Plan:   See Encounters Tab for problem based charting.  Patient seen with Dr. Evette Doffing

## 2018-08-18 NOTE — Assessment & Plan Note (Signed)
On PE his skin appears dry, without rash. He has small scabs over his arms and legs and upper back where he has picked at his skin. These are all dry and do not appear infected.   - CBC, CMP - Cerave emollient lotion - avoid fragrant soaps, use moisturizing, gentle soap - start multivitamin

## 2018-08-18 NOTE — Patient Instructions (Signed)
Thank you for allowing [place] to provide your care today. Today we discussed your dry skin and itching.     I have ordered the following labs for you:  Complete blood count and complete metabolic panel    I will call if any are abnormal.    Today we made the following changes to your medications:   Please START taking   Cerave Lotion - please apply twice daily to areas of dry skin.   Avoid using soaps and detergents that contain fragrance and use a moisturizing body wash.   Multivitamin - please start taking a multivitamin once per day.   These can both be purchased over the counter and your local pharmacy.   Please follow-up if symptoms worsen or fail to improve.    Should you have any questions or concerns please call the internal medicine clinic at [phone].

## 2018-08-19 LAB — CMP14 + ANION GAP
ALT: 12 IU/L (ref 0–44)
AST: 19 IU/L (ref 0–40)
Albumin/Globulin Ratio: 1.3 (ref 1.2–2.2)
Albumin: 3.8 g/dL (ref 3.8–4.8)
Alkaline Phosphatase: 97 IU/L (ref 39–117)
Anion Gap: 17 mmol/L (ref 10.0–18.0)
BUN/Creatinine Ratio: 17 (ref 10–24)
BUN: 19 mg/dL (ref 8–27)
Bilirubin Total: 0.3 mg/dL (ref 0.0–1.2)
CO2: 22 mmol/L (ref 20–29)
Calcium: 9.4 mg/dL (ref 8.6–10.2)
Chloride: 104 mmol/L (ref 96–106)
Creatinine, Ser: 1.13 mg/dL (ref 0.76–1.27)
GFR calc Af Amer: 76 mL/min/{1.73_m2} (ref >59)
GFR calc non Af Amer: 66 mL/min/{1.73_m2} (ref >59)
Globulin, Total: 3 g/dL (ref 1.5–4.5)
Glucose: 91 mg/dL (ref 65–99)
Potassium: 4.5 mmol/L (ref 3.5–5.2)
Sodium: 143 mmol/L (ref 134–144)
Total Protein: 6.8 g/dL (ref 6.0–8.5)

## 2018-08-19 LAB — CBC
Hematocrit: 40.8 % (ref 37.5–51.0)
Hemoglobin: 13 g/dL (ref 13.0–17.7)
MCH: 28.6 pg (ref 26.6–33.0)
MCHC: 31.9 g/dL (ref 31.5–35.7)
MCV: 90 fL (ref 79–97)
Platelets: 293 10*3/uL (ref 150–450)
RBC: 4.54 x10E6/uL (ref 4.14–5.80)
RDW: 13.4 % (ref 11.6–15.4)
WBC: 7.1 10*3/uL (ref 3.4–10.8)

## 2018-08-19 NOTE — Telephone Encounter (Signed)
Return call to pt's sister, [name] - wants to know how was his visit yesterday. Informed he saw Dr Sharon Seller and I will send her request to her. Stated she thought he saw Dr Evette Doffing; informed he was seen in [facility] for his rash.

## 2018-08-19 NOTE — Telephone Encounter (Signed)
Dr. Evette Doffing and I saw him together. I have never spoken with her before and will run it by Dr. Evette Doffing if he'd prefer to call her, otherwise I will reach out to her.

## 2018-08-19 NOTE — Telephone Encounter (Signed)
Pt sister is calling back checking on patient, pls contact her [phone]

## 2018-08-19 NOTE — Progress Notes (Signed)
Internal Medicine Clinic Attending  I saw and evaluated the patient.  I personally confirmed the key portions of the history and exam documented by Dr. Sharon Seller and I reviewed pertinent patient test results.  The assessment, diagnosis, and plan were formulated together and I agree with the documentation in the resident's note.  Person with advanced dementia recently moved to memory unit at [facility] and having some struggles with adjustment. He looks good today, exam is normal, the erosions on his skin are healing well, no cellulitis, only mild distribution. He has tolerated low dose risperidone 0.25mg  very well. Sleeping well at night, no daytime drowsiness. I am hopeful this med will help ease his scratching and improve some of the behavior outbursts. Continue with supportive care through the memory unit staff.

## 2018-08-22 NOTE — Telephone Encounter (Signed)
Thanks everyone. I spoke with [name] this morning. Gave her results of patient's blood work. I am pleased with how he is doing right now. I recommended continuing the medications as they are written. [name] had no further questions.

## 2018-08-24 DIAGNOSIS — S42294D Other nondisplaced fracture of upper end of right humerus, subsequent encounter for fracture with routine healing: Secondary | ICD-10-CM | POA: Diagnosis not present

## 2018-08-30 ENCOUNTER — Other Ambulatory Visit: Payer: Self-pay | Admitting: *Deleted

## 2018-08-30 ENCOUNTER — Other Ambulatory Visit: Payer: Self-pay | Admitting: Student in an Organized Health Care Education/Training Program

## 2018-08-30 NOTE — Telephone Encounter (Signed)
Refill Request from patient's Sister [name] who has questions about the cost of the medication. Please call patient's sister back ,as she had to pay for this Prescription and would like to know why pt's insurance did not pay.   risperiDONE (RISPERDAL) 0.25 MG tablet   [id number] - [place], [place] - [address] AT [place]

## 2018-08-30 NOTE — Telephone Encounter (Signed)
Have rtc x2, no answer, will cont to try

## 2018-08-30 NOTE — Telephone Encounter (Signed)
Pt's sister calls about paying for his med, she is informed scripts need to go to facility pharm not pt's old pharm. She will call when pt needs refill and will use [facility]

## 2018-09-15 ENCOUNTER — Telehealth: Payer: Self-pay | Admitting: Student in an Organized Health Care Education/Training Program

## 2018-09-15 NOTE — Telephone Encounter (Signed)
Pt sister would like a nurse to call back [phone]

## 2018-10-13 ENCOUNTER — Other Ambulatory Visit: Payer: Self-pay | Admitting: Student in an Organized Health Care Education/Training Program

## 2018-10-13 NOTE — Telephone Encounter (Signed)
Please call the pt's Sister [name] back about patient medications.

## 2018-10-14 NOTE — Telephone Encounter (Signed)
rtc to sue. reassured

## 2018-11-14 ENCOUNTER — Telehealth: Payer: Self-pay | Admitting: Student in an Organized Health Care Education/Training Program

## 2018-11-14 MED ORDER — RISPERIDONE 0.25 MG PO TABS: ORAL_TABLET | ORAL | 2 refills | Status: DC
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
## 2018-12-12 ENCOUNTER — Telehealth: Payer: Self-pay | Admitting: *Deleted

## 2018-12-12 DIAGNOSIS — I872 Venous insufficiency (chronic) (peripheral): Secondary | ICD-10-CM

## 2018-12-12 NOTE — Telephone Encounter (Signed)
Jordan Long, his caregiver/ sister calls and wants to check on appt and ask that pt's toenails be taken care of either by inhouse podiatry at facility or appt, will need order and referral for podiatry

## 2018-12-13 ENCOUNTER — Ambulatory Visit (INDEPENDENT_AMBULATORY_CARE_PROVIDER_SITE_OTHER): Payer: Medicare HMO | Admitting: Internal Medicine

## 2018-12-13 ENCOUNTER — Telehealth: Payer: Self-pay | Admitting: *Deleted

## 2018-12-13 ENCOUNTER — Other Ambulatory Visit: Payer: Self-pay

## 2018-12-13 DIAGNOSIS — I872 Venous insufficiency (chronic) (peripheral): Secondary | ICD-10-CM

## 2018-12-13 DIAGNOSIS — F0390 Unspecified dementia without behavioral disturbance: Secondary | ICD-10-CM
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
## 2018-12-13 NOTE — Assessment & Plan Note (Addendum)
History: Patient with history of chronic venous insufficiency.  Per nursing staff, patient had red discoloration of bilateral feet on Saturday which completely went away by Monday.  He has trace to 1+ pitting edema of bilateral ankles.  No open wounds, drainage present.  Staff reports that Bevelyn Buckles' sign is negative.  A/P: Given bilateral nature of rash, and absence of other symptoms, unlikely to represent DVT.  Swelling likely secondary to patient's chronic venous insufficiency.  No systemic signs of infection and no local drainage.  No indication at this time for antibiotics or further imaging work-up.  Staff instructed to call the clinic back with any developments/worsening in patient's symptoms.

## 2018-12-13 NOTE — Progress Notes (Addendum)
  [facility] Internal Medicine Residency Telephone Encounter Continuity Care Appointment  HPI:   This video encounter was created for [name]. on 12/13/2018 for the following purpose/cc: redness of feet.  Due to patient's dementia, video encounter was conducted with assistance of nurse staff, Jordan Long   Past Medical History:  Past Medical History:  Diagnosis Date  . DVT (deep venous thrombosis) ([place]) 2010 and 10/2010   after Hernia repair in 2010 s/p IVC filter and coumadin. Next episode after Hernia repair  ( 10/23/2010) . Admitted on 11/02/2010 for repeat DVT. Warfarin was stopped for  5 days before surgery.   Marland Kitchen DVT (deep venous thrombosis) ([place]) 10/2011   right leg after knee surgery  . GERD (gastroesophageal reflux disease)   . Incisional hernia 08/20/2010  . Pulmonary embolism ([place]) 2010      ROS:  Review of Systems  Constitutional: Negative for chills and fever.  HENT: Negative for congestion.   Respiratory: Negative for cough and shortness of breath.   Cardiovascular: Positive for leg swelling (Bilateral ankles).  Gastrointestinal: Negative for constipation, diarrhea, nausea and vomiting.  All other systems reviewed and are negative.     Focused Exam (via HIPAA-compliant video chat):  * Note exam is limited by video and network quality. Examination conducted with assistance of Jordan Long, nurse on site.  General: Patient awake, conversant Skin: Bilateral feet without significant erythema, drainage, apparent swelling. Per Jordan Long, 1+ bilateral swelling in ankles     Assessment / Plan / Recommendations:   Please see A&P under problem oriented charting for assessment of the patient's acute and chronic medical conditions.   As always, pt is advised that if symptoms worsen or new symptoms arise, they should go to an urgent care facility or to to ER for further evaluation.   Consent and Medical Decision Making:   Patient discussed with and patient feet  evaluated via HIPAA-compliant video chat  with Dr. Rebeca Alert  This is a video encounter between [employer]. and Jordan Long on 12/13/2018 for bilateral feet discoloration. The visit was conducted with the patient located at home and Jordan Long at [facility]. The patient's identity was confirmed using their DOB and current address. The patient has consented to being evaluated through a telephone encounter and understands the associated risks (an examination cannot be done and the patient may need to come in for an appointment) / benefits (allows the patient to remain at home, decreasing exposure to coronavirus). I personally spent 16 minutes on medical discussion.

## 2018-12-13 NOTE — Telephone Encounter (Signed)
Call from pt's sister, Ledora Bottcher - stated she's unsure if she will be the one with the pt at his appt today (@ 1445 Pm in [facility]). Stated her daughter has CHF. And someone at the facility had tested positive. She wants the doctor who will be seeing to be aware pt had severe nosebleed yesterday and on Saturday, swelling of legs, turned purple to blue. Thanks

## 2018-12-13 NOTE — Telephone Encounter (Signed)
If we have the capability to do a virtual visit with video, that would be preferred.

## 2018-12-14 NOTE — Telephone Encounter (Signed)
Referral to podiatry placed

## 2018-12-16 NOTE — Progress Notes (Signed)
Internal Medicine Clinic Attending  I personally confirmed the key portions of the history documented by Dr. Benjamine Mola and reviewed the appearance of his legs by video call.  The assessment, diagnosis, and plan were formulated together and I agree with the documentation in the resident's note.  Lenice Pressman, M.D., Ph.D.

## 2019-01-03 ENCOUNTER — Encounter: Payer: Self-pay | Admitting: Podiatry

## 2019-01-03 ENCOUNTER — Ambulatory Visit: Payer: Medicare HMO | Admitting: Podiatry

## 2019-01-03 ENCOUNTER — Other Ambulatory Visit: Payer: Self-pay

## 2019-01-03 VITALS — BP 119/74 | HR 58

## 2019-01-03 DIAGNOSIS — M79674 Pain in right toe(s): Secondary | ICD-10-CM | POA: Diagnosis not present

## 2019-01-03 DIAGNOSIS — B351 Tinea unguium: Secondary | ICD-10-CM

## 2019-01-03 DIAGNOSIS — I872 Venous insufficiency (chronic) (peripheral): Secondary | ICD-10-CM

## 2019-01-03 DIAGNOSIS — M79675 Pain in left toe(s): Secondary | ICD-10-CM | POA: Diagnosis not present

## 2019-01-03 DIAGNOSIS — Z7901 Long term (current) use of anticoagulants: Secondary | ICD-10-CM

## 2019-01-14 NOTE — Progress Notes (Signed)
Subjective:   Patient ID: Jordan Long., male   DOB: 68 y.o.   MRN: [id number]   HPI 68 year old male presents the office with concerns of thick, discolored, elongated toenails he cannot trim himself.  He also has a history of venous insufficiency and he does get swelling to both of his feet although mild today.  He is also on Xarelto.  Denies any open sores.  No claudication symptoms.  No other concerns today.  Review of Systems  All other systems reviewed and are negative.  Past Medical History:  Diagnosis Date  . DVT (deep venous thrombosis) ([place]) 2010 and 10/2010   after Hernia repair in 2010 s/p IVC filter and coumadin. Next episode after Hernia repair  ( 10/23/2010) . Admitted on 11/02/2010 for repeat DVT. Warfarin was stopped for  5 days before surgery.   Marland Kitchen DVT (deep venous thrombosis) ([place]) 10/2011   right leg after knee surgery  . GERD (gastroesophageal reflux disease)   . Incisional hernia 08/20/2010  . Pulmonary embolism ([place]) 2010    Past Surgical History:  Procedure Laterality Date  . APPENDECTOMY  age 27  . FEMORAL ARTERY STENT Right 2010  . HERNIA REPAIR  2010  . HERNIA REPAIR  10/23/2010   Incisional hernia w/mesh  . HERNIA REPAIR    . INCISIONAL HERNIA REPAIR N/A 09/22/2012   Procedure: INICISIONAL HERNIA AND BILATERAL RECURRENT INGUINAL HERNIA  REPAIRED [place] ;  Surgeon: Adin Hector, MD;  Location: [facility];  Service: General;  Laterality: N/A;  . INGUINAL HERNIA REPAIR Bilateral 09/22/2012   Procedure: LAPAROSCOPIC BILATERAL INGUINAL HERNIA REPAIR;  Surgeon: Adin Hector, MD;  Location: [facility];  Service: General;  Laterality: Bilateral;  RECURRENT INGUINAL HERNIAS BILATERAL   . INSERTION OF MESH Bilateral 09/22/2012   Procedure: INSERTION OF MESH;  Surgeon: Adin Hector, MD;  Location: [facility];  Service: General;  Laterality: Bilateral;  FOR REPAIR OF BILATERAL INGUINAL HERNIAS AND ALSO MESH INSERTED FOR VENTRAL HERNIA REPAIR   . KNEE SURGERY  Right 10/2011   [facility] [place]  . LAPAROSCOPIC LYSIS OF ADHESIONS N/A 09/22/2012   Procedure: LAPAROSCOPIC LYSIS OF ADHESIONS 90 minutes ;  Surgeon: Adin Hector, MD;  Location: [facility];  Service: General;  Laterality: N/A;  . septic abdominal surgery  2010  . TONSILLECTOMY  as child     Current Outpatient Medications:  .  acetaminophen (TYLENOL) 500 MG tablet, Take 1 tablet (500 mg total) by mouth every 6 (six) hours as needed for headache., Disp: 30 tablet, Rfl: 3 .  docusate sodium (COLACE) 100 MG capsule, Take 1 capsule (100 mg total) by mouth 2 (two) times daily as needed for mild constipation., Disp: 30 capsule, Rfl: 5 .  donepezil (ARICEPT) 10 MG tablet, Take 1 tablet (10 mg total) by mouth at bedtime., Disp: 90 tablet, Rfl: 3 .  DULoxetine (CYMBALTA) 60 MG capsule, Take 1 capsule (60 mg total) by mouth daily., Disp: 90 capsule, Rfl: 3 .  Emollient (CERAVE) LOTN, Apply two times per day as needed for itching and dry skin., Disp: 355 mL, Rfl: 1 .  Multiple Vitamins-Minerals (MULTIVITAMIN ADULT) CHEW, Chew 1 tablet by mouth daily., Disp: 30 tablet, Rfl: 11 .  omeprazole (PRILOSEC) 20 MG capsule, Take 1 capsule (20 mg total) by mouth daily., Disp: 90 capsule, Rfl: 3 .  ondansetron (ZOFRAN ODT) 4 MG disintegrating tablet, Take 1 tablet (4 mg total) by mouth every 8 (eight) hours as needed for nausea or vomiting., Disp: 20  tablet, Rfl: 2 .  risperiDONE (RISPERDAL) 0.25 MG tablet, Take 0.5mg  PO qHS and 0.25mg  PO qAM, Disp: 90 tablet, Rfl: 2 .  XARELTO 20 MG TABS tablet, Take 1 tablet (20 mg total) by mouth daily., Disp: 90 tablet, Rfl: 3  No Known Allergies       Objective:  Physical Exam  General: NAD  Dermatological: Nails are hypertrophic, dystrophic, brittle, discolored, elongated 10. No surrounding redness or drainage. Tenderness nails 1-5 bilaterally. No open lesions or pre-ulcerative lesions are identified today.  Vascular: Dorsalis Pedis artery and Posterior Tibial artery pedal  pulses are palpable bilateral with immedate capillary fill time.  Mild swelling to the bilateral lower extremities without any erythema or warmth.  The calf is supple.  There is no pain with calf compression, swelling, warmth, erythema.   Neruologic: Grossly intact via light touch bilateral.   Musculoskeletal: No gross boney pedal deformities bilateral. No pain, crepitus, or limitation noted with foot and ankle range of motion bilateral.      Assessment:   Onychomycosis, on Xarelto; venous insufficiency    Plan:  -Treatment options discussed including all alternatives, risks, and complications -Etiology of symptoms were discussed -Nails debrided 10 without complications or bleeding. -Regards to the venous insufficiency discussed elevation.  No current signs of DVT or infection but continue to monitor. -Daily foot inspection -Follow-up in 3 months or sooner if any problems arise. In the meantime, encouraged to call the office with any questions, concerns, change in symptoms.   Celesta Gentile, DPM

## 2019-01-16 ENCOUNTER — Other Ambulatory Visit: Payer: Self-pay | Admitting: *Deleted

## 2019-01-16 ENCOUNTER — Telehealth: Payer: Self-pay

## 2019-01-16 ENCOUNTER — Emergency Department (HOSPITAL_COMMUNITY)
Admission: EM | Admit: 2019-01-16 | Discharge: 2019-01-17 | Disposition: A | Discharge status: Home / Self Care | Payer: Medicare HMO | Source: Home / Self Care | Attending: Emergency Medicine | Admitting: Emergency Medicine

## 2019-01-16 ENCOUNTER — Other Ambulatory Visit: Payer: Self-pay

## 2019-01-16 ENCOUNTER — Emergency Department (HOSPITAL_COMMUNITY): Payer: Medicare HMO

## 2019-01-16 ENCOUNTER — Encounter (HOSPITAL_COMMUNITY): Payer: Self-pay | Admitting: *Deleted

## 2019-01-16 DIAGNOSIS — K7200 Acute and subacute hepatic failure without coma: Secondary | ICD-10-CM | POA: Diagnosis present

## 2019-01-16 DIAGNOSIS — R0902 Hypoxemia: Secondary | ICD-10-CM | POA: Diagnosis not present

## 2019-01-16 DIAGNOSIS — F0390 Unspecified dementia without behavioral disturbance: Secondary | ICD-10-CM | POA: Diagnosis not present

## 2019-01-16 DIAGNOSIS — W19XXXA Unspecified fall, initial encounter: Secondary | ICD-10-CM | POA: Insufficient documentation

## 2019-01-16 DIAGNOSIS — R2689 Other abnormalities of gait and mobility: Secondary | ICD-10-CM | POA: Diagnosis not present

## 2019-01-16 DIAGNOSIS — Y92128 Other place in nursing home as the place of occurrence of the external cause: Secondary | ICD-10-CM | POA: Insufficient documentation

## 2019-01-16 DIAGNOSIS — U071 COVID-19: Secondary | ICD-10-CM | POA: Diagnosis present

## 2019-01-16 DIAGNOSIS — Z95828 Presence of other vascular implants and grafts: Secondary | ICD-10-CM | POA: Diagnosis not present

## 2019-01-16 DIAGNOSIS — Z7901 Long term (current) use of anticoagulants: Secondary | ICD-10-CM | POA: Diagnosis not present

## 2019-01-16 DIAGNOSIS — R531 Weakness: Secondary | ICD-10-CM | POA: Diagnosis present

## 2019-01-16 DIAGNOSIS — K219 Gastro-esophageal reflux disease without esophagitis: Secondary | ICD-10-CM | POA: Diagnosis present

## 2019-01-16 DIAGNOSIS — Z7401 Bed confinement status: Secondary | ICD-10-CM | POA: Diagnosis not present

## 2019-01-16 DIAGNOSIS — I10 Essential (primary) hypertension: Secondary | ICD-10-CM | POA: Diagnosis not present

## 2019-01-16 DIAGNOSIS — F0280 Dementia in other diseases classified elsewhere without behavioral disturbance: Secondary | ICD-10-CM | POA: Diagnosis present

## 2019-01-16 DIAGNOSIS — D649 Anemia, unspecified: Secondary | ICD-10-CM | POA: Diagnosis present

## 2019-01-16 DIAGNOSIS — R519 Headache, unspecified: Secondary | ICD-10-CM | POA: Insufficient documentation

## 2019-01-16 DIAGNOSIS — E876 Hypokalemia: Secondary | ICD-10-CM | POA: Diagnosis present

## 2019-01-16 DIAGNOSIS — S0990XA Unspecified injury of head, initial encounter: Secondary | ICD-10-CM | POA: Diagnosis not present

## 2019-01-16 DIAGNOSIS — R509 Fever, unspecified: Secondary | ICD-10-CM | POA: Diagnosis not present

## 2019-01-16 DIAGNOSIS — R278 Other lack of coordination: Secondary | ICD-10-CM | POA: Diagnosis not present

## 2019-01-16 DIAGNOSIS — Z86718 Personal history of other venous thrombosis and embolism: Secondary | ICD-10-CM | POA: Diagnosis not present

## 2019-01-16 DIAGNOSIS — Z87891 Personal history of nicotine dependence: Secondary | ICD-10-CM | POA: Diagnosis not present

## 2019-01-16 DIAGNOSIS — R404 Transient alteration of awareness: Secondary | ICD-10-CM | POA: Diagnosis not present

## 2019-01-16 DIAGNOSIS — R4182 Altered mental status, unspecified: Secondary | ICD-10-CM | POA: Diagnosis not present

## 2019-01-16 DIAGNOSIS — M2550 Pain in unspecified joint: Secondary | ICD-10-CM | POA: Diagnosis not present

## 2019-01-16 DIAGNOSIS — G309 Alzheimer's disease, unspecified: Secondary | ICD-10-CM | POA: Diagnosis present

## 2019-01-16 DIAGNOSIS — J189 Pneumonia, unspecified organism: Secondary | ICD-10-CM

## 2019-01-16 DIAGNOSIS — Y9389 Activity, other specified: Secondary | ICD-10-CM | POA: Insufficient documentation

## 2019-01-16 DIAGNOSIS — Z6826 Body mass index (BMI) 26.0-26.9, adult: Secondary | ICD-10-CM | POA: Diagnosis not present

## 2019-01-16 DIAGNOSIS — E663 Overweight: Secondary | ICD-10-CM | POA: Diagnosis present

## 2019-01-16 DIAGNOSIS — Z86711 Personal history of pulmonary embolism: Secondary | ICD-10-CM | POA: Diagnosis not present

## 2019-01-16 DIAGNOSIS — R41841 Cognitive communication deficit: Secondary | ICD-10-CM | POA: Diagnosis not present

## 2019-01-16 DIAGNOSIS — Z79899 Other long term (current) drug therapy: Secondary | ICD-10-CM | POA: Diagnosis not present

## 2019-01-16 DIAGNOSIS — R1312 Dysphagia, oropharyngeal phase: Secondary | ICD-10-CM | POA: Diagnosis not present

## 2019-01-16 DIAGNOSIS — R296 Repeated falls: Secondary | ICD-10-CM | POA: Diagnosis present

## 2019-01-16 DIAGNOSIS — J1289 Other viral pneumonia: Secondary | ICD-10-CM | POA: Diagnosis present

## 2019-01-16 DIAGNOSIS — Z9049 Acquired absence of other specified parts of digestive tract: Secondary | ICD-10-CM | POA: Diagnosis not present

## 2019-01-16 DIAGNOSIS — E785 Hyperlipidemia, unspecified: Secondary | ICD-10-CM | POA: Diagnosis present

## 2019-01-16 DIAGNOSIS — I959 Hypotension, unspecified: Secondary | ICD-10-CM | POA: Diagnosis present

## 2019-01-16 DIAGNOSIS — M6281 Muscle weakness (generalized): Secondary | ICD-10-CM | POA: Diagnosis not present

## 2019-01-16 DIAGNOSIS — Y999 Unspecified external cause status: Secondary | ICD-10-CM | POA: Insufficient documentation

## 2019-01-16 DIAGNOSIS — S199XXA Unspecified injury of neck, initial encounter: Secondary | ICD-10-CM | POA: Diagnosis not present

## 2019-01-16 DIAGNOSIS — E860 Dehydration: Secondary | ICD-10-CM | POA: Diagnosis present

## 2019-01-16 LAB — CBC WITH DIFFERENTIAL/PLATELET
Abs Immature Granulocytes: 0.02 10*3/uL (ref 0.00–0.07)
Basophils Absolute: 0 10*3/uL (ref 0.0–0.1)
Basophils Relative: 0 %
Eosinophils Absolute: 0 10*3/uL (ref 0.0–0.5)
Eosinophils Relative: 0 %
HCT: 39.4 % (ref 39.0–52.0)
Hemoglobin: 12.7 g/dL — ABNORMAL LOW (ref 13.0–17.0)
Immature Granulocytes: 0 %
Lymphocytes Relative: 10 %
Lymphs Abs: 0.7 10*3/uL (ref 0.7–4.0)
MCH: 29.5 pg (ref 26.0–34.0)
MCHC: 32.2 g/dL (ref 30.0–36.0)
MCV: 91.6 fL (ref 80.0–100.0)
Monocytes Absolute: 1.4 10*3/uL — ABNORMAL HIGH (ref 0.1–1.0)
Monocytes Relative: 21 %
Neutro Abs: 4.6 10*3/uL (ref 1.7–7.7)
Neutrophils Relative %: 69 %
Platelets: 239 10*3/uL (ref 150–400)
RBC: 4.3 MIL/uL (ref 4.22–5.81)
RDW: 13.2 % (ref 11.5–15.5)
WBC: 6.7 10*3/uL (ref 4.0–10.5)
nRBC: 0 % (ref 0.0–0.2)

## 2019-01-16 LAB — BASIC METABOLIC PANEL
Anion gap: 11 (ref 5–15)
BUN: 27 mg/dL — ABNORMAL HIGH (ref 8–23)
CO2: 23 mmol/L (ref 22–32)
Calcium: 8.9 mg/dL (ref 8.9–10.3)
Chloride: 105 mmol/L (ref 98–111)
Creatinine, Ser: 1.18 mg/dL (ref 0.61–1.24)
GFR calc Af Amer: >60 mL/min (ref >60)
GFR calc non Af Amer: >60 mL/min (ref >60)
Glucose, Bld: 101 mg/dL — ABNORMAL HIGH (ref 70–99)
Potassium: 3.9 mmol/L (ref 3.5–5.1)
Sodium: 139 mmol/L (ref 135–145)

## 2019-01-16 LAB — CBG MONITORING, ED: Glucose-Capillary: 87 mg/dL (ref 70–99)

## 2019-01-16 MED ORDER — GUAIFENESIN ER 600 MG PO TB12: 600.0000 mg | ORAL_TABLET | Freq: Two times a day (BID) | ORAL | 0 refills | Status: DC | PRN

## 2019-01-16 MED ORDER — ACETAMINOPHEN 500 MG PO TABS
1000.0000 mg | ORAL_TABLET | Freq: Once | ORAL | Status: AC
  Administered 2019-01-16: 1000 mg via ORAL
  Filled 2019-01-16: qty 2

## 2019-01-16 NOTE — ED Provider Notes (Signed)
[facility] EMERGENCY DEPARTMENT Provider Note   CSN: [id number] Arrival date & time: 01/16/19  1743     History Chief Complaint  Patient presents with   Jordan Long. is a 68 y.o. male.  Presents to the emergency department with chief complaint fall.  Patient has dementia, history limited.  Facility reported patient had unwitnessed fall, no known trauma.  Patient otherwise acting at baseline.  When interviewing patient, states he has no pain, no acute complaints.  Chart review, noted past medical history DVT, on Xarelto, Alzheimer's dementia.  HPI     Past Medical History:  Diagnosis Date   DVT (deep venous thrombosis) ([place]) 2010 and 10/2010   after Hernia repair in 2010 s/p IVC filter and coumadin. Next episode after Hernia repair  ( 10/23/2010) . Admitted on 11/02/2010 for repeat DVT. Warfarin was stopped for  5 days before surgery.    DVT (deep venous thrombosis) ([place]) 10/2011   right leg after knee surgery   GERD (gastroesophageal reflux disease)    Incisional hernia 08/20/2010   Pulmonary embolism ([place]) 2010    Patient Active Problem List   Diagnosis Date Noted   Excoriation (skin-picking) disorder 08/18/2018   Pruritus 08/04/2018   Basal cell carcinoma (BCC) 12/27/2017   Hypercoagulable state ([place]) 01/20/2016   Chronic venous insufficiency 06/04/2015   Alzheimer's dementia ([place]) 12/25/2013   Dyslipidemia 06/13/2013   Preventative health care 12/09/2012   Osteoarthritis of knees, bilateral 07/13/2011   GERD (gastroesophageal reflux disease) 07/13/2011   Generalized anxiety disorder 03/02/2011    Past Surgical History:  Procedure Laterality Date   APPENDECTOMY  age 73   FEMORAL ARTERY STENT Right 2010   HERNIA REPAIR  2010   HERNIA REPAIR  10/23/2010   Incisional hernia w/mesh   HERNIA REPAIR     INCISIONAL HERNIA REPAIR N/A 09/22/2012   Procedure: INICISIONAL HERNIA AND BILATERAL RECURRENT INGUINAL  HERNIA  REPAIRED [place] ;  Surgeon: Adin Hector, MD;  [facility];  Service: General;  Laterality: N/A;   INGUINAL HERNIA REPAIR Bilateral 09/22/2012   Procedure: LAPAROSCOPIC BILATERAL INGUINAL HERNIA REPAIR;  Surgeon: Adin Hector, MD;  Location: [facility];  Service: General;  Laterality: Bilateral;  RECURRENT INGUINAL HERNIAS BILATERAL    INSERTION OF MESH Bilateral 09/22/2012   Procedure: INSERTION OF MESH;  Surgeon: Adin Hector, MD;  [facility];  Service: General;  Laterality: Bilateral;  FOR REPAIR OF BILATERAL INGUINAL HERNIAS AND ALSO MESH INSERTED FOR VENTRAL HERNIA REPAIR    KNEE SURGERY Right 10/2011   [place]   LAPAROSCOPIC LYSIS OF ADHESIONS N/A 09/22/2012   Procedure: LAPAROSCOPIC LYSIS OF ADHESIONS 90 minutes ;  Surgeon: Adin Hector, MD;  Location: [facility];  Service: General;  Laterality: N/A;   septic abdominal surgery  2010   TONSILLECTOMY  as child       Family History  Problem Relation Age of Onset   Cancer Father        Lymphoma    Raynaud syndrome Father    Diabetes Mother    Parkinsonism Mother    Heart disease Mother    Schizophrenia Mother    Thyroid disease Sister    Diabetes Sister    Raynaud syndrome Sister    Lupus Sister    Raynaud syndrome Sister    Heart attack Other        neice   Colon cancer Neg Hx     Social History  Tobacco Use   Smoking status: Former Smoker    Years: 2.00    Types: Cigarettes    Quit date: 09/23/1985    Years since quitting: 33.3   Smokeless tobacco: Never Used  Substance Use Topics   Alcohol use: No    Alcohol/week: 0.0 standard drinks   Drug use: No    Home Medications Prior to Admission medications   Medication Sig Start Date End Date Taking? Authorizing Provider  acetaminophen (TYLENOL) 500 MG tablet Take 1 tablet (500 mg total) by mouth every 6 (six) hours as needed for headache. 02/05/18   Ina Homes, MD  docusate sodium (COLACE) 100 MG capsule Take 1  capsule (100 mg total) by mouth 2 (two) times daily as needed for mild constipation. 05/19/18   Annia Belt, MD  donepezil (ARICEPT) 10 MG tablet Take 1 tablet (10 mg total) by mouth at bedtime. 12/10/17   Axel Filler, MD  DULoxetine (CYMBALTA) 60 MG capsule Take 1 capsule (60 mg total) by mouth daily. 12/10/17   Axel Filler, MD  Emollient [facility]) LOTN Apply two times per day as needed for itching and dry skin. 08/18/18   Seawell, Jaimie A, DO  guaiFENesin (MUCINEX) 600 MG 12 hr tablet Take 1 tablet (600 mg total) by mouth 2 (two) times daily as needed for to loosen phlegm. 01/16/19   Lucious Groves, DO  Multiple Vitamins-Minerals (MULTIVITAMIN ADULT) CHEW Chew 1 tablet by mouth daily. 08/18/18   Seawell, Jaimie A, DO  omeprazole (PRILOSEC) 20 MG capsule Take 1 capsule (20 mg total) by mouth daily. 12/10/17   Axel Filler, MD  ondansetron (ZOFRAN ODT) 4 MG disintegrating tablet Take 1 tablet (4 mg total) by mouth every 8 (eight) hours as needed for nausea or vomiting. 05/23/18   Annia Belt, MD  risperiDONE (RISPERDAL) 0.25 MG tablet Take 0.5mg  PO qHS and 0.25mg  PO qAM 11/14/18   Axel Filler, MD  XARELTO 20 MG TABS tablet Take 1 tablet (20 mg total) by mouth daily. 12/10/17   Axel Filler, MD    Allergies    Patient has no known allergies.  Review of Systems   Review of Systems  Unable to perform ROS: Dementia    Physical Exam Updated Vital Signs BP 122/87 (BP Location: Right Arm)    Pulse 68    Temp (!) 100.9 F (38.3 C) (Oral)    Resp 16    SpO2 100%   Physical Exam Vitals and nursing note reviewed.  Constitutional:      Appearance: He is well-developed.     Comments: Pleasantly demented  HENT:     Head: Normocephalic and atraumatic.  Eyes:     Conjunctiva/sclera: Conjunctivae normal.  Cardiovascular:     Rate and Rhythm: Normal rate and regular rhythm.     Heart sounds: No murmur.  Pulmonary:     Effort:  Pulmonary effort is normal. No respiratory distress.     Breath sounds: Normal breath sounds.  Abdominal:     Palpations: Abdomen is soft.     Tenderness: There is no abdominal tenderness.  Musculoskeletal:     Cervical back: Neck supple.     Comments: No tenderness to palpation throughout all 4 extremities, no tenderness to palpation over C, T, L-spine  Skin:    General: Skin is warm and dry.     Capillary Refill: Capillary refill takes less than 2 seconds.  Neurological:     Mental Status: He is alert.  Comments: Alert, oriented to person but not place or time     ED Results / Procedures / Treatments   Labs (all labs ordered are listed, but only abnormal results are displayed) Labs Reviewed  CBC WITH DIFFERENTIAL/PLATELET - Abnormal; Notable for the following components:      Result Value   Hemoglobin 12.7 (*)    Monocytes Absolute 1.4 (*)    All other components within normal limits  BASIC METABOLIC PANEL - Abnormal; Notable for the following components:   Glucose, Bld 101 (*)    BUN 27 (*)    All other components within normal limits  SARS CORONAVIRUS 2 (TAT 6-24 HRS)  URINALYSIS, ROUTINE W REFLEX MICROSCOPIC  CBG MONITORING, ED    EKG EKG Interpretation  Date/Time:  Monday January 16 2019 20:31:58 EST Ventricular Rate:  72 PR Interval:  146 QRS Duration: 94 QT Interval:  370 QTC Calculation: 405 R Axis:   -22 Text Interpretation: Sinus rhythm with Premature supraventricular complexes Cannot rule out Anterior infarct , age undetermined Abnormal ECG Confirmed by Madalyn Rob [phone]) on 01/16/2019 9:30:27 PM   Radiology CT Head Wo Contrast  Result Date: 01/16/2019 CLINICAL DATA:  Headache, fall EXAM: CT HEAD WITHOUT CONTRAST TECHNIQUE: Contiguous axial images were obtained from the base of the skull through the vertex without intravenous contrast. COMPARISON:  04/01/2018 FINDINGS: Brain: There is atrophy and chronic small vessel disease changes. No acute  intracranial abnormality. Specifically, no hemorrhage, hydrocephalus, mass lesion, acute infarction, or significant intracranial injury. Vascular: No hyperdense vessel or unexpected calcification. Skull: No acute calvarial abnormality. Sinuses/Orbits: Near complete opacification of the right maxillary sinus. Other: None IMPRESSION: Atrophy, chronic microvascular disease. No acute intracranial abnormality. Right maxillary sinusitis. Electronically Signed   By: Rolm Baptise M.D.   On: 01/16/2019 19:46    Procedures Procedures (including critical care time)  Medications Ordered in ED Medications  acetaminophen (TYLENOL) tablet 1,000 mg (1,000 mg Oral Given 01/16/19 2205)    ED Course  I have reviewed the triage vital signs and the nursing notes.  Pertinent labs & imaging results that were available during my care of the patient were reviewed by me and considered in my medical decision making (see chart for details).    MDM Rules/Calculators/A&P                      68 year old male with dementia presents to ER after unwitnessed fall.  History of DVT on anticoagulation.  On exam, no focal findings to suggest trauma were identified.  CT head negative.  Screening labs within normal limits.  EKG without arrhythmia, no ischemic changes.  Patient initially was plan to be discharged however repeat vital signs demonstrated temperature 100.9 F.  Will check for Covid and urine to further evaluate.  While awaiting these results, patient signed out to Dr. Dina Rich.  Final Clinical Impression(s) / ED Diagnoses Final diagnoses:  Fall, initial encounter  Dementia without behavioral disturbance, unspecified dementia type [place])    Rx / DC Orders ED Discharge Orders    None       Lucrezia Starch, MD 01/16/19 2347

## 2019-01-16 NOTE — ED Notes (Signed)
ptar called- eta 20-25 mins

## 2019-01-16 NOTE — ED Triage Notes (Signed)
Patient presents to ed via [facility] states patient had an unwitnessed fall was found lying on his left side. Denies injury, no obv. Injury.  Patient has Alzheimer alert to name only

## 2019-01-16 NOTE — Telephone Encounter (Signed)
Mucinex is fine. Order placed.

## 2019-01-16 NOTE — Telephone Encounter (Signed)
His senior living center needs faxed rx. I have provided one.

## 2019-01-16 NOTE — Telephone Encounter (Signed)
Called pt's sister, [name] - asking about Mucinex.; informed received request this am and Dr Evette Doffing has filled and sent sent rx to [facility] on [address]. Stated thank-you.

## 2019-01-16 NOTE — Telephone Encounter (Signed)
Pt sister is calling regarding patient medicine; pt contact [phone]

## 2019-01-16 NOTE — Telephone Encounter (Signed)
Received fax from [employer], [place] at [place] - "resident sounds like has a cold, sounds congested daughter would like a order for Mucinex". I called Amber who's not available, talked to [facility] - stated pt does not have fever, cp,sob; was lasted tested on Tuesday for covid.

## 2019-01-16 NOTE — Addendum Note (Signed)
Addended by: Lucious Groves on: 01/16/2019 04:24 PM   Modules accepted: Orders

## 2019-01-16 NOTE — Telephone Encounter (Signed)
Received TC from pt's sister, states [facility] facility needs RX for Guaifenesin faxed to facility.  RX obtained from Dr. Heber [place] and faxed to [facility] at [phone].  [facility] on [address] phoned and message left to cancel Guaifenesin RX which was sent via [place] earlier today. SChaplin, RN,BSN

## 2019-01-16 NOTE — Discharge Instructions (Addendum)
Recommend patient follow-up with his primary doctor for recheck later this week.  If he has any additional falls, chest pain, difficulty breathing or other new concerning symptom, recommend return to ER for reassessment.  He developed a fever while in the emergency room.  Chest x-ray questionable for pneumonia.  His Covid testing is pending.  He will be given antibiotics.  Monitor closely for any new or worsening symptoms.  He should isolate until Covid testing returns.

## 2019-01-17 ENCOUNTER — Emergency Department (HOSPITAL_COMMUNITY): Payer: Medicare HMO

## 2019-01-17 ENCOUNTER — Emergency Department (HOSPITAL_COMMUNITY)
Admission: EM | Admit: 2019-01-17 | Discharge: 2019-01-17 | Disposition: A | Discharge status: Home / Self Care | Payer: Medicare HMO | Source: Home / Self Care | Attending: Emergency Medicine | Admitting: Emergency Medicine

## 2019-01-17 ENCOUNTER — Telehealth: Payer: Self-pay | Admitting: *Deleted

## 2019-01-17 ENCOUNTER — Other Ambulatory Visit: Payer: Self-pay

## 2019-01-17 DIAGNOSIS — Z79899 Other long term (current) drug therapy: Secondary | ICD-10-CM | POA: Insufficient documentation

## 2019-01-17 DIAGNOSIS — Y999 Unspecified external cause status: Secondary | ICD-10-CM | POA: Insufficient documentation

## 2019-01-17 DIAGNOSIS — Y939 Activity, unspecified: Secondary | ICD-10-CM | POA: Insufficient documentation

## 2019-01-17 DIAGNOSIS — G309 Alzheimer's disease, unspecified: Secondary | ICD-10-CM | POA: Insufficient documentation

## 2019-01-17 DIAGNOSIS — Z043 Encounter for examination and observation following other accident: Secondary | ICD-10-CM | POA: Insufficient documentation

## 2019-01-17 DIAGNOSIS — W19XXXA Unspecified fall, initial encounter: Secondary | ICD-10-CM

## 2019-01-17 DIAGNOSIS — W050XXA Fall from non-moving wheelchair, initial encounter: Secondary | ICD-10-CM | POA: Insufficient documentation

## 2019-01-17 DIAGNOSIS — Z87891 Personal history of nicotine dependence: Secondary | ICD-10-CM | POA: Insufficient documentation

## 2019-01-17 DIAGNOSIS — Y929 Unspecified place or not applicable: Secondary | ICD-10-CM | POA: Insufficient documentation

## 2019-01-17 LAB — URINALYSIS, ROUTINE W REFLEX MICROSCOPIC
Bilirubin Urine: NEGATIVE
Glucose, UA: NEGATIVE mg/dL
Ketones, ur: NEGATIVE mg/dL
Leukocytes,Ua: NEGATIVE
Nitrite: NEGATIVE
Protein, ur: NEGATIVE mg/dL
Specific Gravity, Urine: 1.028 (ref 1.005–1.030)
pH: 5 (ref 5.0–8.0)

## 2019-01-17 LAB — SARS CORONAVIRUS 2 (TAT 6-24 HRS): SARS Coronavirus 2: POSITIVE — AB

## 2019-01-17 MED ORDER — ACETAMINOPHEN 500 MG PO TABS
500.0000 mg | ORAL_TABLET | Freq: Once | ORAL | Status: DC
  Filled 2019-01-17: qty 1

## 2019-01-17 MED ORDER — DOXYCYCLINE HYCLATE 100 MG PO CAPS: 100.0000 mg | ORAL_CAPSULE | Freq: Two times a day (BID) | ORAL | 0 refills | Status: DC

## 2019-01-17 NOTE — Telephone Encounter (Signed)
Received fax from [facility] - " Resident was found on dining room floor; no injuries sent out 911 Had temp of 101,130/88,74, 18". Also pt has tested positive for coronavirus. Thanks

## 2019-01-17 NOTE — ED Provider Notes (Signed)
Patient signed out pending infectious work-up.  Patient was initially seen for a fall.  He was found to have a fever after work-up for the fall.  Urinalysis without evidence of infection.  Covid testing is pending.  Chest x-ray questionable for possible atelectasis versus pneumonia.  Given fever, will treat with doxycycline.  Advised to isolate until Covid testing is resulted.  Patient hemodynamically stable and rested comfortably in the emergency room.  Physical Exam  BP 115/66   Pulse 61   Temp 98.8 F (37.1 C) (Oral)   Resp 16   SpO2 97%   Physical Exam Resting comfortably No acute distress ED Course/Procedures     Procedures  MDM   Problem List Items Addressed This Visit    None    Visit Diagnoses    Fall, initial encounter    -  Primary   Dementia without behavioral disturbance, unspecified dementia type ([place])       Relevant Medications   risperiDONE (RISPERDAL) 0.5 MG tablet   Community acquired pneumonia, unspecified laterality       Relevant Medications   doxycycline (VIBRAMYCIN) 100 MG capsule            Merryl Hacker, MD 01/17/19 [phone]

## 2019-01-17 NOTE — ED Provider Notes (Signed)
[facility] EMERGENCY DEPARTMENT Provider Note   CSN: [id number] Arrival date & time: 01/17/19  1334     History Chief Complaint  Patient presents with  . Covid/ fall    Jordan Suver Suvir Goertz. is a 68 y.o. male.  HPI    Level 5 caveat due to Alzheimer's.  Jordan Shillito Tauheed Gmerek. is a 69 y.o. male, with a history of DVT, GERD, PE, Alzheimer's dementia, venous insufficiency, presenting to the ED from skilled nursing facility following a fall that occurred earlier today.  Patient reportedly fell forward out of his wheelchair. They report patient also tested positive for Covid this morning. Per EMS and paperwork that arrived with the patient, he is at his baseline mental status. Patient does not voice any complaints.       Past Medical History:  Diagnosis Date  . DVT (deep venous thrombosis) ([place]) 2010 and 10/2010   after Hernia repair in 2010 s/p IVC filter and coumadin. Next episode after Hernia repair  ( 10/23/2010) . Admitted on 11/02/2010 for repeat DVT. Warfarin was stopped for  5 days before surgery.   Marland Kitchen DVT (deep venous thrombosis) ([place]) 10/2011   right leg after knee surgery  . GERD (gastroesophageal reflux disease)   . Incisional hernia 08/20/2010  . Pulmonary embolism ([place]) 2010    Patient Active Problem List   Diagnosis Date Noted  . Excoriation (skin-picking) disorder 08/18/2018  . Pruritus 08/04/2018  . Basal cell carcinoma (BCC) 12/27/2017  . Hypercoagulable state ([place]) 01/20/2016  . Chronic venous insufficiency 06/04/2015  . Alzheimer's dementia ([place]) 12/25/2013  . Dyslipidemia 06/13/2013  . Preventative health care 12/09/2012  . Osteoarthritis of knees, bilateral 07/13/2011  . GERD (gastroesophageal reflux disease) 07/13/2011  . Generalized anxiety disorder 03/02/2011    Past Surgical History:  Procedure Laterality Date  . APPENDECTOMY  age 45  . FEMORAL ARTERY STENT Right 2010  . HERNIA REPAIR  2010  . HERNIA REPAIR   10/23/2010   Incisional hernia w/mesh  . HERNIA REPAIR    . INCISIONAL HERNIA REPAIR N/A 09/22/2012   Procedure: INICISIONAL HERNIA AND BILATERAL RECURRENT INGUINAL HERNIA  REPAIRED [place] ;  Surgeon: Adin Hector, MD;  Location: [facility];  Service: General;  Laterality: N/A;  . INGUINAL HERNIA REPAIR Bilateral 09/22/2012   Procedure: LAPAROSCOPIC BILATERAL INGUINAL HERNIA REPAIR;  Surgeon: Adin Hector, MD;  Location: [facility];  Service: General;  Laterality: Bilateral;  RECURRENT INGUINAL HERNIAS BILATERAL   . INSERTION OF MESH Bilateral 09/22/2012   Procedure: INSERTION OF MESH;  Surgeon: Adin Hector, MD;  Location: [facility];  Service: General;  Laterality: Bilateral;  FOR REPAIR OF BILATERAL INGUINAL HERNIAS AND ALSO MESH INSERTED FOR VENTRAL HERNIA REPAIR   . KNEE SURGERY Right 10/2011   [facility]  . LAPAROSCOPIC LYSIS OF ADHESIONS N/A 09/22/2012   Procedure: LAPAROSCOPIC LYSIS OF ADHESIONS 90 minutes ;  Surgeon: Adin Hector, MD;  Location: [facility];  Service: General;  Laterality: N/A;  . septic abdominal surgery  2010  . TONSILLECTOMY  as child       Family History  Problem Relation Age of Onset  . Cancer Father        Lymphoma   . Raynaud syndrome Father   . Diabetes Mother   . Parkinsonism Mother   . Heart disease Mother   . Schizophrenia Mother   . Thyroid disease Sister   . Diabetes Sister   . Raynaud syndrome Sister   . Lupus Sister   .  Raynaud syndrome Sister   . Heart attack Other        neice  . Colon cancer Neg Hx     Social History   Tobacco Use  . Smoking status: Former Smoker    Years: 2.00    Types: Cigarettes    Quit date: 09/23/1985    Years since quitting: 33.3  . Smokeless tobacco: Never Used  Substance Use Topics  . Alcohol use: No    Alcohol/week: 0.0 standard drinks  . Drug use: No    Home Medications Prior to Admission medications   Medication Sig Start Date End Date Taking? Authorizing Provider  acetaminophen (TYLENOL) 500 MG  tablet Take 1 tablet (500 mg total) by mouth every 6 (six) hours as needed for headache. 02/05/18   Ina Homes, MD  docusate sodium (COLACE) 100 MG capsule Take 1 capsule (100 mg total) by mouth 2 (two) times daily as needed for mild constipation. Patient not taking: Reported on 01/17/2019 05/19/18   Annia Belt, MD  donepezil (ARICEPT) 10 MG tablet Take 1 tablet (10 mg total) by mouth at bedtime. 12/10/17   Axel Filler, MD  doxycycline (VIBRAMYCIN) 100 MG capsule Take 1 capsule (100 mg total) by mouth 2 (two) times daily. 01/17/19   Horton, Barbette Hair, MD  DULoxetine (CYMBALTA) 60 MG capsule Take 1 capsule (60 mg total) by mouth daily. 12/10/17   Axel Filler, MD  Emollient [facility]) LOTN Apply two times per day as needed for itching and dry skin. Patient taking differently: Apply topically 2 (two) times daily.  08/18/18   Seawell, Jaimie A, DO  guaiFENesin (MUCINEX) 600 MG 12 hr tablet Take 1 tablet (600 mg total) by mouth 2 (two) times daily as needed for to loosen phlegm. Patient not taking: Reported on 01/17/2019 01/16/19   Lucious Groves, DO  Multiple Vitamin (DAILY VITE PO) Take 1 tablet by mouth daily.    [provider]  Multiple Vitamins-Minerals (MULTIVITAMIN ADULT) CHEW Chew 1 tablet by mouth daily. Patient not taking: Reported on 01/17/2019 08/18/18   Seawell, Andris Baumann A, DO  omeprazole (PRILOSEC) 20 MG capsule Take 1 capsule (20 mg total) by mouth daily. 12/10/17   Axel Filler, MD  ondansetron (ZOFRAN ODT) 4 MG disintegrating tablet Take 1 tablet (4 mg total) by mouth every 8 (eight) hours as needed for nausea or vomiting. Patient not taking: Reported on 01/17/2019 05/23/18   Annia Belt, MD  oxyCODONE-acetaminophen (PERCOCET/ROXICET) 5-325 MG tablet Take 1 tablet by mouth every 6 (six) hours as needed for moderate pain or severe pain.    [provider]  risperiDONE (RISPERDAL) 0.25 MG tablet Take 0.5mg  PO qHS and 0.25mg  PO  qAM Patient taking differently: Take 0.25 mg by mouth daily.  11/14/18   Axel Filler, MD  risperiDONE (RISPERDAL) 0.5 MG tablet Take 0.5 mg by mouth at bedtime.    [provider]  XARELTO 20 MG TABS tablet Take 1 tablet (20 mg total) by mouth daily. 12/10/17   Axel Filler, MD    Allergies    Patient has no known allergies.  Review of Systems   Review of Systems  Unable to perform ROS: Dementia    Physical Exam Updated Vital Signs BP (!) 161/93 (BP Location: Right Arm)   Pulse 95   Temp 100.2 F (37.9 C) (Oral)   Resp 18   SpO2 95%   Physical Exam Vitals and nursing note reviewed.  Constitutional:  General: He is not in acute distress.    Appearance: He is well-developed. He is not diaphoretic.  HENT:     Head: Normocephalic and atraumatic.     Comments: No noted tenderness, swelling, wound, deformity, or instability to the head or face.    Mouth/Throat:     Mouth: Mucous membranes are moist.     Pharynx: Oropharynx is clear.  Eyes:     Conjunctiva/sclera: Conjunctivae normal.     Pupils: Pupils are equal, round, and reactive to light.  Cardiovascular:     Rate and Rhythm: Normal rate and regular rhythm.     Pulses: Normal pulses.          Radial pulses are 2+ on the right side and 2+ on the left side.       Posterior tibial pulses are 2+ on the right side and 2+ on the left side.     Heart sounds: Normal heart sounds.     Comments: Tactile temperature in the extremities appropriate and equal bilaterally. Pulmonary:     Effort: Pulmonary effort is normal. No respiratory distress.     Breath sounds: Normal breath sounds.     Comments: No tachypnea, increased work of breathing, or any other noted difficulty. Abdominal:     Palpations: Abdomen is soft.     Tenderness: There is no abdominal tenderness. There is no guarding.  Musculoskeletal:     Cervical back: Neck supple.     Right lower leg: No edema.     Left lower leg: No edema.       Comments: No midline spinal tenderness as far as I am able to tell with this patient of limited verbal capability.   Overall trauma exam performed without any abnormalities noted other than those mentioned.  Lymphadenopathy:     Cervical: No cervical adenopathy.  Skin:    General: Skin is warm and dry.  Neurological:     Mental Status: He is alert.     Comments: Patient is minimally verbal.  He is able to tell me his name and that he is in the hospital.  This is consistent with reported baseline mental status. There is muscle resistance initially when I attempt to move his extremities, but then patient complies with exam.  He will occasionally hold his arm up in a position in which I place it, but would not lift his extremities on his own. With observation, patient was noted to move each of his extremities.  Psychiatric:        Mood and Affect: Mood and affect normal.        Speech: Speech normal.        Behavior: Behavior normal.     ED Results / Procedures / Treatments   Labs (all labs ordered are listed, but only abnormal results are displayed) Labs Reviewed - No data to display  EKG None  Radiology DG Chest 2 View  Result Date: 01/17/2019 CLINICAL DATA:  Fever. EXAM: CHEST - 2 VIEW COMPARISON:  Chest radiograph 02/21/2011. FINDINGS: Lower lung volumes from prior exam. Patchy and streaky bibasilar opacities. Heart is normal in size with normal mediastinal contours. No pulmonary edema, pleural effusion, or pneumothorax. Chronic wedging of lower thoracic vertebra. No acute osseous abnormalities are seen. Remote right proximal humerus fracture is partially included. IMPRESSION: Patchy and streaky bibasilar opacities, may be atelectasis or pneumonia in the setting of fever. Electronically Signed   By: Keith Rake M.D.   On: 01/17/2019 00:29  CT Head Wo Contrast  Result Date: 01/17/2019 CLINICAL DATA:  Patient status post fall today.  Initial encounter. EXAM: CT HEAD  WITHOUT CONTRAST TECHNIQUE: Contiguous axial images were obtained from the base of the skull through the vertex without intravenous contrast. COMPARISON:  Head CT 01/16/2019. FINDINGS: Brain: No evidence of acute infarction, hemorrhage, hydrocephalus, extra-axial collection or mass lesion/mass effect. Atrophy and chronic microvascular ischemic change noted. Vascular: No hyperdense vessel or unexpected calcification. Skull: Intact.  No focal lesion. Sinuses/Orbits: The right maxillary sinus is completely opacified. Scattered ethmoid air cell disease on the right also noted. Other: None. IMPRESSION: 1. No acute abnormality. 2. Atrophy and chronic microvascular ischemic change. 3. Complete opacification of the right maxillary sinus. Electronically Signed   By: Inge Rise M.D.   On: 01/17/2019 20:07   CT Head Wo Contrast  Result Date: 01/16/2019 CLINICAL DATA:  Headache, fall EXAM: CT HEAD WITHOUT CONTRAST TECHNIQUE: Contiguous axial images were obtained from the base of the skull through the vertex without intravenous contrast. COMPARISON:  04/01/2018 FINDINGS: Brain: There is atrophy and chronic small vessel disease changes. No acute intracranial abnormality. Specifically, no hemorrhage, hydrocephalus, mass lesion, acute infarction, or significant intracranial injury. Vascular: No hyperdense vessel or unexpected calcification. Skull: No acute calvarial abnormality. Sinuses/Orbits: Near complete opacification of the right maxillary sinus. Other: None IMPRESSION: Atrophy, chronic microvascular disease. No acute intracranial abnormality. Right maxillary sinusitis. Electronically Signed   By: Rolm Baptise M.D.   On: 01/16/2019 19:46   CT Cervical Spine Wo Contrast  Result Date: 01/17/2019 CLINICAL DATA:  Fall EXAM: CT CERVICAL SPINE WITHOUT CONTRAST TECHNIQUE: Multidetector CT imaging of the cervical spine was performed without intravenous contrast. Multiplanar CT image reconstructions were also generated.  COMPARISON:  None. FINDINGS: Alignment: No subluxation Skull base and vertebrae: No acute fracture. No primary bone lesion or focal pathologic process. Soft tissues and spinal canal: No prevertebral fluid or swelling. No visible canal hematoma. Disc levels:  Diffuse degenerative disc and facet disease. Upper chest: No acute findings Other: None IMPRESSION: Diffuse cervical degenerative changes.  No acute bony abnormality. Electronically Signed   By: Rolm Baptise M.D.   On: 01/17/2019 19:19    Procedures Procedures (including critical care time)  Medications Ordered in ED Medications  acetaminophen (TYLENOL) tablet 500 mg (500 mg Oral Not Given 01/17/19 2040)    ED Course  I have reviewed the triage vital signs and the nursing notes.  Pertinent labs & imaging results that were available during my care of the patient were reviewed by me and considered in my medical decision making (see chart for details).  Clinical Course as of Jan 16 2314  Tue Jan 17, 2019  1819 Patient reevaluated.  No change in presentation.  Suspect this pressure may have been taken when the patient was moving or with his arm over his head.  BP(!): 96/57 [SJ]    Clinical Course User Index [SJ] Rylei Masella, Helane Gunther, PA-C   MDM Rules/Calculators/A&P                      Patient presents for evaluation following a fall from sitting.  He has no noted injuries on physical exam. Patient is nontoxic appearing, afebrile, not tachycardic, not tachypneic, not hypotensive, maintains excellent SPO2 on room air, and is in no apparent distress.  CT scans without acute abnormalities. Patient discharged back to his facility.   Findings and plan of care discussed with Nat Christen, MD.   Vitals:  01/17/19 2100 01/17/19 2130 01/17/19 2200 01/17/19 2230  BP: 102/66 107/78 110/82 106/82  Pulse: 88 90 85 82  Resp: (!) 23 17 (!) 23 16  Temp:      TempSrc:      SpO2: 98% 95% 99% 94%     Final Clinical Impression(s) / ED  Diagnoses Final diagnoses:  Fall, initial encounter    Rx / DC Orders ED Discharge Orders    None       Layla Maw 01/17/19 2318    Nat Christen, MD 01/18/19 1244

## 2019-01-17 NOTE — ED Triage Notes (Signed)
Pt arrives via EMS from [place] where patient was sitting in a chair eating lunch and had a fall. Pt on xarelto. C-collar on arrival. COVID + diagnosis this morning. No injuries noted. Hx of alzheimers. VSS. bp 156/98. 96% ra hr 70 afib, cbg 128.

## 2019-01-17 NOTE — ED Notes (Signed)
Report given to Shirlean Mylar, RN at [facility].

## 2019-01-17 NOTE — ED Notes (Signed)
Pt was discharged with PTAR at 0410; +Covid results reported after pt was discharged with PTAR; [facility] contacted for + test by April RN

## 2019-01-17 NOTE — ED Notes (Addendum)
Pt is A-fib w/ BBB on monitor 

## 2019-01-17 NOTE — Discharge Instructions (Signed)
There were no acute findings on CTs of the head or cervical spine. Follow-up with your primary care provider for any further management of any symptoms.

## 2019-01-17 NOTE — Telephone Encounter (Signed)
I spoke with Jordan Long at this number. Sounds like Jordan Long did not do well going back to [facility] with Covid diagnosis. He is in the ED now, I anticipate he will be admitted to our service this evening. I talked to her about the supportive treatment options that we have to offer, and that Jordan Long is high risk for complications given his advanced age and dementia. We will see how he does.

## 2019-01-17 NOTE — Telephone Encounter (Signed)
Pt sister is calling about pt results, she has some questions; pls contact her [phone]

## 2019-01-18 ENCOUNTER — Inpatient Hospital Stay (HOSPITAL_COMMUNITY)
Admission: EM | Admit: 2019-01-18 | Discharge: 2019-01-29 | DRG: 177 | Disposition: A | Discharge status: Skilled Nursing Facility | Payer: Medicare HMO | Source: Skilled Nursing Facility | Attending: Internal Medicine | Admitting: Internal Medicine

## 2019-01-18 ENCOUNTER — Telehealth: Payer: Self-pay | Admitting: Student in an Organized Health Care Education/Training Program

## 2019-01-18 ENCOUNTER — Encounter (HOSPITAL_COMMUNITY): Payer: Self-pay

## 2019-01-18 ENCOUNTER — Other Ambulatory Visit: Payer: Self-pay

## 2019-01-18 DIAGNOSIS — Z86718 Personal history of other venous thrombosis and embolism: Secondary | ICD-10-CM

## 2019-01-18 DIAGNOSIS — Z86711 Personal history of pulmonary embolism: Secondary | ICD-10-CM | POA: Diagnosis present

## 2019-01-18 DIAGNOSIS — Z95828 Presence of other vascular implants and grafts: Secondary | ICD-10-CM

## 2019-01-18 DIAGNOSIS — R531 Weakness: Secondary | ICD-10-CM

## 2019-01-18 DIAGNOSIS — J1289 Other viral pneumonia: Secondary | ICD-10-CM | POA: Diagnosis present

## 2019-01-18 DIAGNOSIS — U071 COVID-19: Secondary | ICD-10-CM | POA: Diagnosis present

## 2019-01-18 DIAGNOSIS — E876 Hypokalemia: Secondary | ICD-10-CM | POA: Diagnosis present

## 2019-01-18 DIAGNOSIS — R296 Repeated falls: Secondary | ICD-10-CM | POA: Diagnosis present

## 2019-01-18 DIAGNOSIS — K7200 Acute and subacute hepatic failure without coma: Secondary | ICD-10-CM | POA: Diagnosis present

## 2019-01-18 DIAGNOSIS — Z79899 Other long term (current) drug therapy: Secondary | ICD-10-CM

## 2019-01-18 DIAGNOSIS — I959 Hypotension, unspecified: Secondary | ICD-10-CM | POA: Diagnosis present

## 2019-01-18 DIAGNOSIS — F0280 Dementia in other diseases classified elsewhere without behavioral disturbance: Secondary | ICD-10-CM | POA: Diagnosis present

## 2019-01-18 DIAGNOSIS — E785 Hyperlipidemia, unspecified: Secondary | ICD-10-CM | POA: Diagnosis present

## 2019-01-18 DIAGNOSIS — E860 Dehydration: Secondary | ICD-10-CM | POA: Diagnosis present

## 2019-01-18 DIAGNOSIS — K219 Gastro-esophageal reflux disease without esophagitis: Secondary | ICD-10-CM | POA: Diagnosis present

## 2019-01-18 DIAGNOSIS — Z9049 Acquired absence of other specified parts of digestive tract: Secondary | ICD-10-CM

## 2019-01-18 DIAGNOSIS — R7401 Elevation of levels of liver transaminase levels: Secondary | ICD-10-CM | POA: Diagnosis present

## 2019-01-18 DIAGNOSIS — Z7901 Long term (current) use of anticoagulants: Secondary | ICD-10-CM

## 2019-01-18 DIAGNOSIS — G309 Alzheimer's disease, unspecified: Secondary | ICD-10-CM | POA: Diagnosis present

## 2019-01-18 DIAGNOSIS — Z87891 Personal history of nicotine dependence: Secondary | ICD-10-CM

## 2019-01-18 DIAGNOSIS — D649 Anemia, unspecified: Secondary | ICD-10-CM | POA: Diagnosis present

## 2019-01-18 DIAGNOSIS — Z6826 Body mass index (BMI) 26.0-26.9, adult: Secondary | ICD-10-CM

## 2019-01-18 DIAGNOSIS — E663 Overweight: Secondary | ICD-10-CM | POA: Diagnosis present

## 2019-01-18 LAB — CBC WITH DIFFERENTIAL/PLATELET
Abs Immature Granulocytes: 0.03 10*3/uL (ref 0.00–0.07)
Basophils Absolute: 0 10*3/uL (ref 0.0–0.1)
Basophils Relative: 0 %
Eosinophils Absolute: 0 10*3/uL (ref 0.0–0.5)
Eosinophils Relative: 0 %
HCT: 38.3 % — ABNORMAL LOW (ref 39.0–52.0)
Hemoglobin: 12.2 g/dL — ABNORMAL LOW (ref 13.0–17.0)
Immature Granulocytes: 0 %
Lymphocytes Relative: 6 %
Lymphs Abs: 0.5 10*3/uL — ABNORMAL LOW (ref 0.7–4.0)
MCH: 29.3 pg (ref 26.0–34.0)
MCHC: 31.9 g/dL (ref 30.0–36.0)
MCV: 92.1 fL (ref 80.0–100.0)
Monocytes Absolute: 0.5 10*3/uL (ref 0.1–1.0)
Monocytes Relative: 6 %
Neutro Abs: 7.4 10*3/uL (ref 1.7–7.7)
Neutrophils Relative %: 88 %
Platelets: 211 10*3/uL (ref 150–400)
RBC: 4.16 MIL/uL — ABNORMAL LOW (ref 4.22–5.81)
RDW: 13.2 % (ref 11.5–15.5)
WBC: 8.5 10*3/uL (ref 4.0–10.5)
nRBC: 0 % (ref 0.0–0.2)

## 2019-01-18 LAB — BASIC METABOLIC PANEL
Anion gap: 8 (ref 5–15)
BUN: 27 mg/dL — ABNORMAL HIGH (ref 8–23)
CO2: 23 mmol/L (ref 22–32)
Calcium: 8.2 mg/dL — ABNORMAL LOW (ref 8.9–10.3)
Chloride: 108 mmol/L (ref 98–111)
Creatinine, Ser: 1.14 mg/dL (ref 0.61–1.24)
GFR calc Af Amer: >60 mL/min (ref >60)
GFR calc non Af Amer: >60 mL/min (ref >60)
Glucose, Bld: 130 mg/dL — ABNORMAL HIGH (ref 70–99)
Potassium: 3.1 mmol/L — ABNORMAL LOW (ref 3.5–5.1)
Sodium: 139 mmol/L (ref 135–145)

## 2019-01-18 LAB — URINALYSIS, ROUTINE W REFLEX MICROSCOPIC
Bacteria, UA: NONE SEEN
Bilirubin Urine: NEGATIVE
Glucose, UA: NEGATIVE mg/dL
Hgb urine dipstick: NEGATIVE
Ketones, ur: 5 mg/dL — AB
Leukocytes,Ua: NEGATIVE
Nitrite: NEGATIVE
Protein, ur: 30 mg/dL — AB
Specific Gravity, Urine: 1.034 — ABNORMAL HIGH (ref 1.005–1.030)
pH: 5 (ref 5.0–8.0)

## 2019-01-18 MED ORDER — POTASSIUM CHLORIDE CRYS ER 20 MEQ PO TBCR
40.0000 meq | EXTENDED_RELEASE_TABLET | Freq: Once | ORAL | Status: AC
  Administered 2019-01-18: 40 meq via ORAL
  Filled 2019-01-18: qty 2

## 2019-01-18 MED ORDER — IBUPROFEN 200 MG PO TABS
400.0000 mg | ORAL_TABLET | Freq: Once | ORAL | Status: AC
  Administered 2019-01-18: 400 mg via ORAL
  Filled 2019-01-18: qty 2

## 2019-01-18 NOTE — Telephone Encounter (Signed)
Pt sister is calling to speak to a nurse [phone]

## 2019-01-18 NOTE — ED Triage Notes (Signed)
Patient arrived via [facility] from [facility] [place] (Memory Care Unit). Patient is COVID +, Patient does have dementia. Per EMS, who have transported the patient 24 hours ago, patient presents WNL and more alert than yesterday. However, facility states that patient is weak, not eating or drinking, and had a fever of '109' F and today recently had a fever of '102' F.   There is a gap in communication or misinformation from facility to family. RN will informed ED provider to see if ED Provider will reach out and update patient due to 3 ED visits within 48 hours.  1000mg  of tylenol given at 1630 522mL normal saline IV

## 2019-01-18 NOTE — ED Provider Notes (Addendum)
11:35 PM Assumed care from Dr. Wilson Singer, please see their note for full history, physical and decision making until this point. In brief this is a 68 y.o. year old male who presented to the ED tonight with COVID + and Health Assessment     Pending UA for eval. Likely can go home. covid as well. Falls, no injury.   UA negative. Patient stable.   Reassessed with low BP's. From records, it appears he is consistently in the 100-120 range now he is having BP's in mid 90's as low as [id number] systolic. This with fever, tachypnea, low pulse ox (92 RA at rest) is likely related to weakness/falls at the facility. Fluids given with some response. Will plan for observation.   CRITICAL CARE Performed by: Merrily Pew Total critical care time: 35 minutes Critical care time was exclusive of separately billable procedures and treating other patients. Critical care was necessary to treat or prevent imminent or life-threatening deterioration. Critical care was time spent personally by me on the following activities: development of treatment plan with patient and/or surrogate as well as nursing, discussions with consultants, evaluation of patient's response to treatment, examination of patient, obtaining history from patient or surrogate, ordering and performing treatments and interventions, ordering and review of laboratory studies, ordering and review of radiographic studies, pulse oximetry and re-evaluation of patient's condition.   Labs, studies and imaging reviewed by myself and considered in medical decision making if ordered. Imaging interpreted by radiology.  Labs Reviewed  URINALYSIS, ROUTINE W REFLEX MICROSCOPIC - Abnormal; Notable for the following components:      Result Value   Color, Urine AMBER (*)    Specific Gravity, Urine 1.034 (*)    Ketones, ur 5 (*)    Protein, ur 30 (*)    All other components within normal limits  BASIC METABOLIC PANEL - Abnormal; Notable for the following components:   Potassium 3.1 (*)    Glucose, Bld 130 (*)    BUN 27 (*)    Calcium 8.2 (*)    All other components within normal limits  CBC WITH DIFFERENTIAL/PLATELET - Abnormal; Notable for the following components:   RBC 4.16 (*)    Hemoglobin 12.2 (*)    HCT 38.3 (*)    Lymphs Abs 0.5 (*)    All other components within normal limits  URINE CULTURE    No orders to display    No follow-ups on file. Merrily Pew, MD 01/18/19 [id number]    Merrily Pew, MD 01/19/19 [phone]

## 2019-01-18 NOTE — Telephone Encounter (Signed)
Pt sister is calling back [phone]

## 2019-01-18 NOTE — ED Provider Notes (Signed)
[facility] DEPT Provider Note   CSN: [id number] Arrival date & time: 01/18/19  1735     History Chief Complaint  Patient presents with  . COVID +  . [place] [name]. is a 68 y.o. male.  HPI  68 year old male sent for evaluation from his assisted living facility.  This is his third evaluation in the past 2 days.  He had a falls that prompted the prior visits.  HE tested positive for COVID on 01/16/19.  Patient currently has no complaints.  Denies any acute pain anywhere.  He does have a listed history of Alzheimer dementia.  This probably does limit his history somewhat such as him denying any coughing although there noted him to cough a few times while was in the room.  From what is worth though, he denies any shortness of breath.  No nausea or vomiting.  No urinary complaints.  When asked about his appetite he laughingly replied "It's good.  Sometimes it's a little bit too good."  Past Medical History:  Diagnosis Date  . DVT (deep venous thrombosis) ([place]) 2010 and 10/2010   after Hernia repair in 2010 s/p IVC filter and coumadin. Next episode after Hernia repair  ( 10/23/2010) . Admitted on 11/02/2010 for repeat DVT. Warfarin was stopped for  5 days before surgery.   Marland Kitchen DVT (deep venous thrombosis) ([place]) 10/2011   right leg after knee surgery  . GERD (gastroesophageal reflux disease)   . Incisional hernia 08/20/2010  . Pulmonary embolism ([place]) 2010    Patient Active Problem List   Diagnosis Date Noted  . Excoriation (skin-picking) disorder 08/18/2018  . Pruritus 08/04/2018  . Basal cell carcinoma (BCC) 12/27/2017  . Hypercoagulable state ([place]) 01/20/2016  . Chronic venous insufficiency 06/04/2015  . Alzheimer's dementia ([place]) 12/25/2013  . Dyslipidemia 06/13/2013  . Preventative health care 12/09/2012  . Osteoarthritis of knees, bilateral 07/13/2011  . GERD (gastroesophageal reflux disease) 07/13/2011  .  Generalized anxiety disorder 03/02/2011    Past Surgical History:  Procedure Laterality Date  . APPENDECTOMY  age 82  . FEMORAL ARTERY STENT Right 2010  . HERNIA REPAIR  2010  . HERNIA REPAIR  10/23/2010   Incisional hernia w/mesh  . HERNIA REPAIR    . INCISIONAL HERNIA REPAIR N/A 09/22/2012   Procedure: INICISIONAL HERNIA AND BILATERAL RECURRENT INGUINAL HERNIA  REPAIRED [place] ;  Surgeon: Adin Hector, MD;  Location: [facility];  Service: General;  Laterality: N/A;  . INGUINAL HERNIA REPAIR Bilateral 09/22/2012   Procedure: LAPAROSCOPIC BILATERAL INGUINAL HERNIA REPAIR;  Surgeon: Adin Hector, MD;  Location: [facility];  Service: General;  Laterality: Bilateral;  RECURRENT INGUINAL HERNIAS BILATERAL   . INSERTION OF MESH Bilateral 09/22/2012   Procedure: INSERTION OF MESH;  Surgeon: Adin Hector, MD;  Location: [facility];  Service: General;  Laterality: Bilateral;  FOR REPAIR OF BILATERAL INGUINAL HERNIAS AND ALSO MESH INSERTED FOR VENTRAL HERNIA REPAIR   . KNEE SURGERY Right 10/2011   [facility]  . LAPAROSCOPIC LYSIS OF ADHESIONS N/A 09/22/2012   Procedure: LAPAROSCOPIC LYSIS OF ADHESIONS 90 minutes ;  Surgeon: Adin Hector, MD;  Location: [facility];  Service: General;  Laterality: N/A;  . septic abdominal surgery  2010  . TONSILLECTOMY  as child       Family History  Problem Relation Age of Onset  . Cancer Father        Lymphoma   . Raynaud syndrome Father   .  Diabetes Mother   . Parkinsonism Mother   . Heart disease Mother   . Schizophrenia Mother   . Thyroid disease Sister   . Diabetes Sister   . Raynaud syndrome Sister   . Lupus Sister   . Raynaud syndrome Sister   . Heart attack Other        neice  . Colon cancer Neg Hx     Social History   Tobacco Use  . Smoking status: Former Smoker    Years: 2.00    Types: Cigarettes    Quit date: 09/23/1985    Years since quitting: 33.3  . Smokeless tobacco: Never Used  Substance Use Topics  . Alcohol use: No     Alcohol/week: 0.0 standard drinks  . Drug use: No    Home Medications Prior to Admission medications   Medication Sig Start Date End Date Taking? Authorizing Provider  acetaminophen (TYLENOL) 500 MG tablet Take 1 tablet (500 mg total) by mouth every 6 (six) hours as needed for headache. 02/05/18   Ina Homes, MD  docusate sodium (COLACE) 100 MG capsule Take 1 capsule (100 mg total) by mouth 2 (two) times daily as needed for mild constipation. Patient not taking: Reported on 01/17/2019 05/19/18   Annia Belt, MD  donepezil (ARICEPT) 10 MG tablet Take 1 tablet (10 mg total) by mouth at bedtime. 12/10/17   Axel Filler, MD  doxycycline (VIBRAMYCIN) 100 MG capsule Take 1 capsule (100 mg total) by mouth 2 (two) times daily. 01/17/19   Horton, Barbette Hair, MD  DULoxetine (CYMBALTA) 60 MG capsule Take 1 capsule (60 mg total) by mouth daily. 12/10/17   Axel Filler, MD  Emollient [facility]) LOTN Apply two times per day as needed for itching and dry skin. Patient taking differently: Apply topically 2 (two) times daily.  08/18/18   Seawell, Jaimie A, DO  guaiFENesin (MUCINEX) 600 MG 12 hr tablet Take 1 tablet (600 mg total) by mouth 2 (two) times daily as needed for to loosen phlegm. Patient not taking: Reported on 01/17/2019 01/16/19   Lucious Groves, DO  Multiple Vitamin (DAILY VITE PO) Take 1 tablet by mouth daily.    [provider]  Multiple Vitamins-Minerals (MULTIVITAMIN ADULT) CHEW Chew 1 tablet by mouth daily. Patient not taking: Reported on 01/17/2019 08/18/18   Seawell, Andris Baumann A, DO  omeprazole (PRILOSEC) 20 MG capsule Take 1 capsule (20 mg total) by mouth daily. 12/10/17   Axel Filler, MD  ondansetron (ZOFRAN ODT) 4 MG disintegrating tablet Take 1 tablet (4 mg total) by mouth every 8 (eight) hours as needed for nausea or vomiting. Patient not taking: Reported on 01/17/2019 05/23/18   Annia Belt, MD  oxyCODONE-acetaminophen  (PERCOCET/ROXICET) 5-325 MG tablet Take 1 tablet by mouth every 6 (six) hours as needed for moderate pain or severe pain.    [provider]  risperiDONE (RISPERDAL) 0.25 MG tablet Take 0.5mg  PO qHS and 0.25mg  PO qAM Patient taking differently: Take 0.25 mg by mouth daily.  11/14/18   Axel Filler, MD  risperiDONE (RISPERDAL) 0.5 MG tablet Take 0.5 mg by mouth at bedtime.    [provider]  XARELTO 20 MG TABS tablet Take 1 tablet (20 mg total) by mouth daily. 12/10/17   Axel Filler, MD    Allergies    Patient has no known allergies.  Review of Systems   Review of Systems  All systems reviewed and negative, other than as noted in HPI.  Physical Exam Updated Vital Signs SpO2 96%   Physical Exam Vitals and nursing note reviewed.  Constitutional:      General: He is not in acute distress.    Appearance: He is well-developed.  HENT:     Head: Normocephalic and atraumatic.  Eyes:     General:        Right eye: No discharge.        Left eye: No discharge.     Conjunctiva/sclera: Conjunctivae normal.  Cardiovascular:     Rate and Rhythm: Normal rate and regular rhythm.     Heart sounds: Normal heart sounds. No murmur. No friction rub. No gallop.   Pulmonary:     Effort: Pulmonary effort is normal. No respiratory distress.     Breath sounds: Normal breath sounds.  Abdominal:     General: There is no distension.     Palpations: Abdomen is soft.     Tenderness: There is no abdominal tenderness.  Musculoskeletal:        General: No tenderness.     Cervical back: Neck supple.  Skin:    General: Skin is warm and dry.  Neurological:     Mental Status: He is alert.  Psychiatric:        Behavior: Behavior normal.     ED Results / Procedures / Treatments   Labs (all labs ordered are listed, but only abnormal results are displayed) Labs Reviewed  BASIC METABOLIC PANEL - Abnormal; Notable for the following components:      Result Value    Potassium 3.1 (*)    Glucose, Bld 130 (*)    BUN 27 (*)    Calcium 8.2 (*)    All other components within normal limits  CBC WITH DIFFERENTIAL/PLATELET - Abnormal; Notable for the following components:   RBC 4.16 (*)    Hemoglobin 12.2 (*)    HCT 38.3 (*)    Lymphs Abs 0.5 (*)    All other components within normal limits  URINE CULTURE  URINALYSIS, ROUTINE W REFLEX MICROSCOPIC    EKG None  Radiology DG Chest 2 View  Result Date: 01/17/2019 CLINICAL DATA:  Fever. EXAM: CHEST - 2 VIEW COMPARISON:  Chest radiograph 02/21/2011. FINDINGS: Lower lung volumes from prior exam. Patchy and streaky bibasilar opacities. Heart is normal in size with normal mediastinal contours. No pulmonary edema, pleural effusion, or pneumothorax. Chronic wedging of lower thoracic vertebra. No acute osseous abnormalities are seen. Remote right proximal humerus fracture is partially included. IMPRESSION: Patchy and streaky bibasilar opacities, may be atelectasis or pneumonia in the setting of fever. Electronically Signed   By: Keith Rake M.D.   On: 01/17/2019 00:29   CT Head Wo Contrast  Result Date: 01/17/2019 CLINICAL DATA:  Patient status post fall today.  Initial encounter. EXAM: CT HEAD WITHOUT CONTRAST TECHNIQUE: Contiguous axial images were obtained from the base of the skull through the vertex without intravenous contrast. COMPARISON:  Head CT 01/16/2019. FINDINGS: Brain: No evidence of acute infarction, hemorrhage, hydrocephalus, extra-axial collection or mass lesion/mass effect. Atrophy and chronic microvascular ischemic change noted. Vascular: No hyperdense vessel or unexpected calcification. Skull: Intact.  No focal lesion. Sinuses/Orbits: The right maxillary sinus is completely opacified. Scattered ethmoid air cell disease on the right also noted. Other: None. IMPRESSION: 1. No acute abnormality. 2. Atrophy and chronic microvascular ischemic change. 3. Complete opacification of the right maxillary  sinus. Electronically Signed   By: Inge Rise M.D.   On: 01/17/2019 20:07   CT  Head Wo Contrast  Result Date: 01/16/2019 CLINICAL DATA:  Headache, fall EXAM: CT HEAD WITHOUT CONTRAST TECHNIQUE: Contiguous axial images were obtained from the base of the skull through the vertex without intravenous contrast. COMPARISON:  04/01/2018 FINDINGS: Brain: There is atrophy and chronic small vessel disease changes. No acute intracranial abnormality. Specifically, no hemorrhage, hydrocephalus, mass lesion, acute infarction, or significant intracranial injury. Vascular: No hyperdense vessel or unexpected calcification. Skull: No acute calvarial abnormality. Sinuses/Orbits: Near complete opacification of the right maxillary sinus. Other: None IMPRESSION: Atrophy, chronic microvascular disease. No acute intracranial abnormality. Right maxillary sinusitis. Electronically Signed   By: Rolm Baptise M.D.   On: 01/16/2019 19:46   CT Cervical Spine Wo Contrast  Result Date: 01/17/2019 CLINICAL DATA:  Fall EXAM: CT CERVICAL SPINE WITHOUT CONTRAST TECHNIQUE: Multidetector CT imaging of the cervical spine was performed without intravenous contrast. Multiplanar CT image reconstructions were also generated. COMPARISON:  None. FINDINGS: Alignment: No subluxation Skull base and vertebrae: No acute fracture. No primary bone lesion or focal pathologic process. Soft tissues and spinal canal: No prevertebral fluid or swelling. No visible canal hematoma. Disc levels:  Diffuse degenerative disc and facet disease. Upper chest: No acute findings Other: None IMPRESSION: Diffuse cervical degenerative changes.  No acute bony abnormality. Electronically Signed   By: Rolm Baptise M.D.   On: 01/17/2019 19:19    Procedures Procedures (including critical care time)  Medications Ordered in ED Medications - No data to display  ED Course  I have reviewed the triage vital signs and the nursing notes.  Pertinent labs & imaging results  that were available during my care of the patient were reviewed by me and considered in my medical decision making (see chart for details).    MDM Rules/Calculators/A&P  68yM with fever. He looks well on exam. BP low normal but, on review of records, this appears to be his norm. Reportedly with fever of 109. Highly unlikely. I have never seen a temperature high in a patient that ended up living. Probably 100.9. Regardless, he has no complaints. Labs reassuring aside from mild hypokalemia. Supplemented. Recent COVID positive test but o2 sats are good on room air. UA two days ago with rare bacteria and some blood. Will repeat again today. Fever likely from COVID. In an elderly patient with recent falls though, I don't want to overlook an UTI.    Final Clinical Impression(s) / ED Diagnoses Final diagnoses:  COVID-19  Hypotension, unspecified hypotension type  Weakness    Rx / DC Orders ED Discharge Orders    None       Jordan Manifold, MD 01/19/19 1950

## 2019-01-19 ENCOUNTER — Telehealth: Payer: Self-pay | Admitting: Student in an Organized Health Care Education/Training Program

## 2019-01-19 ENCOUNTER — Telehealth: Payer: Self-pay | Admitting: *Deleted

## 2019-01-19 DIAGNOSIS — F0280 Dementia in other diseases classified elsewhere without behavioral disturbance: Secondary | ICD-10-CM | POA: Diagnosis present

## 2019-01-19 DIAGNOSIS — D649 Anemia, unspecified: Secondary | ICD-10-CM | POA: Diagnosis present

## 2019-01-19 DIAGNOSIS — U071 COVID-19: Secondary | ICD-10-CM | POA: Diagnosis present

## 2019-01-19 DIAGNOSIS — K7200 Acute and subacute hepatic failure without coma: Secondary | ICD-10-CM | POA: Diagnosis present

## 2019-01-19 DIAGNOSIS — G309 Alzheimer's disease, unspecified: Secondary | ICD-10-CM | POA: Diagnosis present

## 2019-01-19 DIAGNOSIS — K219 Gastro-esophageal reflux disease without esophagitis: Secondary | ICD-10-CM | POA: Diagnosis present

## 2019-01-19 DIAGNOSIS — I959 Hypotension, unspecified: Secondary | ICD-10-CM | POA: Diagnosis present

## 2019-01-19 DIAGNOSIS — E876 Hypokalemia: Secondary | ICD-10-CM | POA: Diagnosis present

## 2019-01-19 DIAGNOSIS — R531 Weakness: Secondary | ICD-10-CM | POA: Diagnosis present

## 2019-01-19 DIAGNOSIS — E860 Dehydration: Secondary | ICD-10-CM | POA: Diagnosis present

## 2019-01-19 DIAGNOSIS — R296 Repeated falls: Secondary | ICD-10-CM | POA: Diagnosis present

## 2019-01-19 DIAGNOSIS — Z7901 Long term (current) use of anticoagulants: Secondary | ICD-10-CM | POA: Diagnosis not present

## 2019-01-19 DIAGNOSIS — Z87891 Personal history of nicotine dependence: Secondary | ICD-10-CM | POA: Diagnosis not present

## 2019-01-19 DIAGNOSIS — Z95828 Presence of other vascular implants and grafts: Secondary | ICD-10-CM | POA: Diagnosis not present

## 2019-01-19 DIAGNOSIS — Z9049 Acquired absence of other specified parts of digestive tract: Secondary | ICD-10-CM | POA: Diagnosis not present

## 2019-01-19 DIAGNOSIS — Z6826 Body mass index (BMI) 26.0-26.9, adult: Secondary | ICD-10-CM | POA: Diagnosis not present

## 2019-01-19 DIAGNOSIS — Z86711 Personal history of pulmonary embolism: Secondary | ICD-10-CM | POA: Diagnosis not present

## 2019-01-19 DIAGNOSIS — J1289 Other viral pneumonia: Secondary | ICD-10-CM | POA: Diagnosis present

## 2019-01-19 DIAGNOSIS — Z86718 Personal history of other venous thrombosis and embolism: Secondary | ICD-10-CM | POA: Diagnosis not present

## 2019-01-19 DIAGNOSIS — E785 Hyperlipidemia, unspecified: Secondary | ICD-10-CM | POA: Diagnosis present

## 2019-01-19 DIAGNOSIS — E663 Overweight: Secondary | ICD-10-CM | POA: Diagnosis present

## 2019-01-19 DIAGNOSIS — Z79899 Other long term (current) drug therapy: Secondary | ICD-10-CM | POA: Diagnosis not present

## 2019-01-19 LAB — HIV ANTIBODY (ROUTINE TESTING W REFLEX): HIV Screen 4th Generation wRfx: NONREACTIVE

## 2019-01-19 LAB — CBC
HCT: 34.1 % — ABNORMAL LOW (ref 39.0–52.0)
Hemoglobin: 10.9 g/dL — ABNORMAL LOW (ref 13.0–17.0)
MCH: 29.4 pg (ref 26.0–34.0)
MCHC: 32 g/dL (ref 30.0–36.0)
MCV: 91.9 fL (ref 80.0–100.0)
Platelets: 193 10*3/uL (ref 150–400)
RBC: 3.71 MIL/uL — ABNORMAL LOW (ref 4.22–5.81)
RDW: 13.3 % (ref 11.5–15.5)
WBC: 6.2 10*3/uL (ref 4.0–10.5)
nRBC: 0 % (ref 0.0–0.2)

## 2019-01-19 LAB — HEPATIC FUNCTION PANEL
ALT: 46 U/L — ABNORMAL HIGH (ref 0–44)
AST: 135 U/L — ABNORMAL HIGH (ref 15–41)
Albumin: 2.5 g/dL — ABNORMAL LOW (ref 3.5–5.0)
Alkaline Phosphatase: 52 U/L (ref 38–126)
Bilirubin, Direct: 0.2 mg/dL (ref 0.0–0.2)
Indirect Bilirubin: 0.6 mg/dL (ref 0.3–0.9)
Total Bilirubin: 0.8 mg/dL (ref 0.3–1.2)
Total Protein: 5.8 g/dL — ABNORMAL LOW (ref 6.5–8.1)

## 2019-01-19 LAB — BASIC METABOLIC PANEL
Anion gap: 9 (ref 5–15)
BUN: 28 mg/dL — ABNORMAL HIGH (ref 8–23)
CO2: 25 mmol/L (ref 22–32)
Calcium: 8.2 mg/dL — ABNORMAL LOW (ref 8.9–10.3)
Chloride: 107 mmol/L (ref 98–111)
Creatinine, Ser: 1.24 mg/dL (ref 0.61–1.24)
GFR calc Af Amer: >60 mL/min (ref >60)
GFR calc non Af Amer: 59 mL/min — ABNORMAL LOW (ref >60)
Glucose, Bld: 84 mg/dL (ref 70–99)
Potassium: 3.3 mmol/L — ABNORMAL LOW (ref 3.5–5.1)
Sodium: 141 mmol/L (ref 135–145)

## 2019-01-19 MED ORDER — MULTIVITAMIN ADULT PO CHEW: 1.0000 | CHEWABLE_TABLET | Freq: Every day | ORAL | Status: DC

## 2019-01-19 MED ORDER — SODIUM CHLORIDE 0.9 % IV SOLN
200.0000 mg | Freq: Once | INTRAVENOUS | Status: AC
  Administered 2019-01-19: 13:00:00 200 mg via INTRAVENOUS
  Filled 2019-01-19: qty 40

## 2019-01-19 MED ORDER — DULOXETINE HCL 60 MG PO CPEP
60.0000 mg | ORAL_CAPSULE | Freq: Every day | ORAL | Status: DC
  Administered 2019-01-19 – 2019-01-29 (×11): 60 mg via ORAL
  Filled 2019-01-19 (×11): qty 1

## 2019-01-19 MED ORDER — SODIUM CHLORIDE 0.9 % IV SOLN
100.0000 mg | Freq: Every day | INTRAVENOUS | Status: AC
  Administered 2019-01-20 – 2019-01-23 (×4): 100 mg via INTRAVENOUS
  Filled 2019-01-19 (×4): qty 20

## 2019-01-19 MED ORDER — RISPERIDONE 0.5 MG PO TABS
0.5000 mg | ORAL_TABLET | Freq: Every day | ORAL | Status: DC
  Administered 2019-01-19 – 2019-01-28 (×10): 0.5 mg via ORAL
  Filled 2019-01-19 (×11): qty 1

## 2019-01-19 MED ORDER — PANTOPRAZOLE SODIUM 40 MG PO TBEC
40.0000 mg | DELAYED_RELEASE_TABLET | Freq: Every day | ORAL | Status: DC
  Administered 2019-01-19 – 2019-01-29 (×11): 40 mg via ORAL
  Filled 2019-01-19 (×12): qty 1

## 2019-01-19 MED ORDER — LACTATED RINGERS IV BOLUS
1000.0000 mL | Freq: Once | INTRAVENOUS | Status: AC
  Administered 2019-01-19: 1000 mL via INTRAVENOUS

## 2019-01-19 MED ORDER — OXYCODONE-ACETAMINOPHEN 5-325 MG PO TABS: 1.0000 | ORAL_TABLET | Freq: Four times a day (QID) | ORAL | Status: DC | PRN

## 2019-01-19 MED ORDER — RIVAROXABAN 20 MG PO TABS
20.0000 mg | ORAL_TABLET | Freq: Every day | ORAL | Status: DC
  Administered 2019-01-19 – 2019-01-29 (×11): 20 mg via ORAL
  Filled 2019-01-19 (×11): qty 1

## 2019-01-19 MED ORDER — SODIUM CHLORIDE 0.9 % IV SOLN: INTRAVENOUS | Status: DC

## 2019-01-19 MED ORDER — POTASSIUM CHLORIDE CRYS ER 20 MEQ PO TBCR
40.0000 meq | EXTENDED_RELEASE_TABLET | Freq: Once | ORAL | Status: AC
  Administered 2019-01-19: 17:00:00 40 meq via ORAL
  Filled 2019-01-19: qty 2

## 2019-01-19 MED ORDER — DONEPEZIL HCL 10 MG PO TABS
10.0000 mg | ORAL_TABLET | Freq: Every day | ORAL | Status: DC
  Administered 2019-01-19 – 2019-01-28 (×10): 10 mg via ORAL
  Filled 2019-01-19 (×10): qty 1

## 2019-01-19 MED ORDER — ADULT MULTIVITAMIN W/MINERALS CH
1.0000 | ORAL_TABLET | Freq: Every day | ORAL | Status: DC
  Administered 2019-01-19 – 2019-01-29 (×11): 1 via ORAL
  Filled 2019-01-19 (×11): qty 1

## 2019-01-19 MED ORDER — ACETAMINOPHEN 500 MG PO TABS
500.0000 mg | ORAL_TABLET | Freq: Four times a day (QID) | ORAL | Status: DC | PRN
  Administered 2019-01-21: 16:00:00 500 mg via ORAL
  Filled 2019-01-19: qty 1

## 2019-01-19 MED ORDER — ENOXAPARIN SODIUM 30 MG/0.3ML ~~LOC~~ SOLN
30.0000 mg | SUBCUTANEOUS | Status: DC
  Administered 2019-01-19: 30 mg via SUBCUTANEOUS
  Filled 2019-01-19: qty 0.3

## 2019-01-19 MED ORDER — DEXAMETHASONE SODIUM PHOSPHATE 10 MG/ML IJ SOLN
6.0000 mg | Freq: Every day | INTRAMUSCULAR | Status: DC
  Administered 2019-01-19: 6 mg via INTRAVENOUS
  Filled 2019-01-19: qty 1

## 2019-01-19 NOTE — Telephone Encounter (Signed)
I spoke with [name]. All questions answered. They are interested in discharging Jordan Long to SNF instead of the memory unit at [facility]. I told them about the usual process of PT and OT evaluations when [name] is medically stable, and coordination with the Transitions of Care team.

## 2019-01-19 NOTE — ED Notes (Signed)
Pt pulled IV, and removed condom-cath and brief then urinated in bed.  Pt alert only to self at this time. Pt can stand without assistance

## 2019-01-19 NOTE — Telephone Encounter (Signed)
Pls contact pt sister [phone] 

## 2019-01-19 NOTE — ED Notes (Signed)
Secretary called [facility] for transport.

## 2019-01-19 NOTE — ED Notes (Signed)
[facility] arrived to transport pt to [facility]

## 2019-01-19 NOTE — Telephone Encounter (Signed)
Pt's sister calls and ask that dr Evette Doffing call pt's daughter in law, [name] at [phone], david-son at [phone] they have questions r/t hosp/COVID and care

## 2019-01-19 NOTE — Progress Notes (Signed)
Briefly seen and examined the patient this morning in the ED. Confused.  Slightly restless.  Not oriented to place, person or time. No fever since presentation the ED. Had a long conversation with patient's sister Ms. [name] She states that patient was having fever, weakness in the assisted living and was hence brought to [facility] ED on 12/14.  He tested positive for Covid over there.  He waited in the ED for 2 nights and was discharged back to assisted living facility. He spiked fever of 102 at the facility and was hence sent to ED at [facility].   Patient was noted to be hypotensive and is on IV fluid. Sister also reports multiple falls recently. She wants either her or patient's son Shanon Brow (not Aaron Edelman) to be called for update. Because of history suggestive of Covid pneumonia, I start the patient remdesivir and Decadron per protocol. Patient got transferred to [facility] later this morning.

## 2019-01-19 NOTE — ED Notes (Addendum)
Mesner MD made aware of low blood pressure. MD stated if pt SBP remains below 90 when [facility] arrives pt will be admitted for observation.

## 2019-01-19 NOTE — Plan of Care (Signed)
  Problem: Education: Goal: Knowledge of risk factors and measures for prevention of condition will improve Outcome: Progressing   Problem: Coping: Goal: Psychosocial and spiritual needs will be supported Outcome: Progressing   Problem: Respiratory: Goal: Will maintain a patent airway Outcome: Progressing Goal: Complications related to the disease process, condition or treatment will be avoided or minimized Outcome: Progressing   

## 2019-01-19 NOTE — H&P (Signed)
History and Physical    Jordan Long. [id number] DOB: [birth year] DOA: 01/18/2019  PCP: Axel Filler, MD (Confirm with patient/family/NH records and if not entered, this has to be entered at [facility] point of entry) Patient coming from: Assisted living facility  I have personally briefly reviewed patient's old medical records in [place]  Chief Complaint:   HPI: Jordan Long. is a 68 y.o. male with medical history significant of chronic venous insufficiency, Alzheimer dementia dyslipidemia and GERD presented to ER from his assisted living facility for evaluation of multiple falls.  Patient was tested positive for Covid on 01/16/2019 but denies any cough shortness of breath and body pain.  Patient denies any complaints and states that he is feeling very well.  Patient is answering no to all the review of systems most likely secondary to Alzheimer's dementia.           Patient was managed in ED for hypotension and hypokalemia and will be admitted for observation.   ED Course: Patient was managed with 1 bolus of lactated Ringer 1000 mL in the ED for hypotension and also given potassium chloride 40 mEq p.o. for hypokalemia.  Review of Systems: Patient was saying no to all the questions about review of systems may be secondary to underlying dementia   Past Medical History:  Diagnosis Date  . DVT (deep venous thrombosis) ([place]) 2010 and 10/2010   after Hernia repair in 2010 s/p IVC filter and coumadin. Next episode after Hernia repair  ( 10/23/2010) . Admitted on 11/02/2010 for repeat DVT. Warfarin was stopped for  5 days before surgery.   Marland Kitchen DVT (deep venous thrombosis) ([place]) 10/2011   right leg after knee surgery  . GERD (gastroesophageal reflux disease)   . Incisional hernia 08/20/2010  . Pulmonary embolism ([place]) 2010    Past Surgical History:  Procedure Laterality Date  . APPENDECTOMY  age 61  . FEMORAL ARTERY STENT Right 2010  . HERNIA REPAIR   2010  . HERNIA REPAIR  10/23/2010   Incisional hernia w/mesh  . HERNIA REPAIR    . INCISIONAL HERNIA REPAIR N/A 09/22/2012   Procedure: INICISIONAL HERNIA AND BILATERAL RECURRENT INGUINAL HERNIA  REPAIRED [place] ;  Surgeon: Adin Hector, MD;  Location: [facility];  Service: General;  Laterality: N/A;  . INGUINAL HERNIA REPAIR Bilateral 09/22/2012   Procedure: LAPAROSCOPIC BILATERAL INGUINAL HERNIA REPAIR;  Surgeon: Adin Hector, MD;  Location: [facility];  Service: General;  Laterality: Bilateral;  RECURRENT INGUINAL HERNIAS BILATERAL   . INSERTION OF MESH Bilateral 09/22/2012   Procedure: INSERTION OF MESH;  Surgeon: Adin Hector, MD;  Location: [facility];  Service: General;  Laterality: Bilateral;  FOR REPAIR OF BILATERAL INGUINAL HERNIAS AND ALSO MESH INSERTED FOR VENTRAL HERNIA REPAIR   . KNEE SURGERY Right 10/2011   [facility]  . LAPAROSCOPIC LYSIS OF ADHESIONS N/A 09/22/2012   Procedure: LAPAROSCOPIC LYSIS OF ADHESIONS 90 minutes ;  Surgeon: Adin Hector, MD;  Location: [facility];  Service: General;  Laterality: N/A;  . septic abdominal surgery  2010  . TONSILLECTOMY  as child     reports that he quit smoking about 33 years ago. His smoking use included cigarettes. He quit after 2.00 years of use. He has never used smokeless tobacco. He reports that he does not drink alcohol or use drugs.  No Known Allergies  Family History  Problem Relation Age of Onset  . Cancer Father  Lymphoma   . Raynaud syndrome Father   . Diabetes Mother   . Parkinsonism Mother   . Heart disease Mother   . Schizophrenia Mother   . Thyroid disease Sister   . Diabetes Sister   . Raynaud syndrome Sister   . Lupus Sister   . Raynaud syndrome Sister   . Heart attack Other        neice  . Colon cancer Neg Hx     Prior to Admission medications   Medication Sig Start Date End Date Taking? Authorizing Provider  acetaminophen (TYLENOL) 500 MG tablet Take 1 tablet (500 mg total) by mouth every 6 (six)  hours as needed for headache. 02/05/18  Yes Helberg, Larkin Ina, MD  donepezil (ARICEPT) 10 MG tablet Take 1 tablet (10 mg total) by mouth at bedtime. 12/10/17  Yes Axel Filler, MD  doxycycline (VIBRAMYCIN) 100 MG capsule Take 1 capsule (100 mg total) by mouth 2 (two) times daily. 01/17/19  Yes Horton, Barbette Hair, MD  DULoxetine (CYMBALTA) 60 MG capsule Take 1 capsule (60 mg total) by mouth daily. 12/10/17  Yes Axel Filler, MD  Emollient [facility]) LOTN Apply two times per day as needed for itching and dry skin. Patient taking differently: Apply topically 2 (two) times daily.  08/18/18  Yes Seawell, Jaimie A, DO  guaiFENesin (MUCINEX) 600 MG 12 hr tablet Take 1 tablet (600 mg total) by mouth 2 (two) times daily as needed for to loosen phlegm. 01/16/19  Yes Lucious Groves, DO  Multiple Vitamins-Minerals (MULTIVITAMIN ADULT) CHEW Chew 1 tablet by mouth daily. 08/18/18  Yes Seawell, Jaimie A, DO  omeprazole (PRILOSEC) 20 MG capsule Take 1 capsule (20 mg total) by mouth daily. 12/10/17  Yes Axel Filler, MD  oxyCODONE-acetaminophen (PERCOCET/ROXICET) 5-325 MG tablet Take 1 tablet by mouth every 6 (six) hours as needed for moderate pain or severe pain.   Yes [provider]  risperiDONE (RISPERDAL) 0.25 MG tablet Take 0.5mg  PO qHS and 0.25mg  PO qAM Patient taking differently: Take 0.25 mg by mouth every morning.  11/14/18  Yes Axel Filler, MD  risperiDONE (RISPERDAL) 0.5 MG tablet Take 0.5 mg by mouth at bedtime.   Yes [provider]  XARELTO 20 MG TABS tablet Take 1 tablet (20 mg total) by mouth daily. 12/10/17  Yes Axel Filler, MD    Physical Exam: Vitals:   01/19/19 0337 01/19/19 0500 01/19/19 0600 01/19/19 0630  BP: (!) 91/59 (!) 92/59 102/73 (!) 83/59  Pulse: (!) 59 (!) 58 72 66  Resp: (!) 21 16 (!) 21 (!) 22  Temp:  98.9 F (37.2 C)    TempSrc:  Oral    SpO2: 94% 96% 97% 95%    Constitutional: NAD, calm, comfortable Vitals:    01/19/19 0337 01/19/19 0500 01/19/19 0600 01/19/19 0630  BP: (!) 91/59 (!) 92/59 102/73 (!) 83/59  Pulse: (!) 59 (!) 58 72 66  Resp: (!) 21 16 (!) 21 (!) 22  Temp:  98.9 F (37.2 C)    TempSrc:  Oral    SpO2: 94% 96% 97% 95%   Eyes: PERRL, lids and conjunctivae normal ENMT: Mucous membranes are moist. Posterior pharynx clear of any exudate or lesions.Normal dentition.  Neck: normal, supple, no masses, no thyromegaly Respiratory: clear to auscultation bilaterally, no wheezing, no crackles. Normal respiratory effort. No accessory muscle use.  Cardiovascular: Regular rate and rhythm, no murmurs / rubs / gallops. No extremity edema. 2+ pedal pulses. No carotid bruits.  Abdomen: no tenderness, no masses palpated. No hepatosplenomegaly. Bowel sounds positive.  Musculoskeletal: no clubbing / cyanosis. No joint deformity upper and lower extremities. Good ROM, no contractures. Normal muscle tone.  Skin: no rashes, lesions, ulcers. No induration Neurologic: CN 2-12 grossly intact. Sensation intact, DTR normal. Strength 5/5 in all 4.  Psychiatric: Normal judgment and insight. Alert and oriented x 3. Normal mood.   (Anything < 9 systems with 2 bullets each down codes to level 1) (If patient refuses exam can't bill higher level) (Make sure to document decubitus ulcers present on admission -- if possible -- and whether patient has chronic indwelling catheter at time of admission)  Labs on Admission: I have personally reviewed following labs and imaging studies  CBC: Recent Labs  Lab 01/16/19 1955 01/18/19 1810 01/19/19 0455  WBC 6.7 8.5 6.2  NEUTROABS 4.6 7.4  --   HGB 12.7* 12.2* 10.9*  HCT 39.4 38.3* 34.1*  MCV 91.6 92.1 91.9  PLT 239 211 0000000   Basic Metabolic Panel: Recent Labs  Lab 01/16/19 1955 01/18/19 1810 01/19/19 0455  NA 139 139 141  K 3.9 3.1* 3.3*  CL 105 108 107  CO2 23 23 25   GLUCOSE 101* 130* 84  BUN 27* 27* 28*  CREATININE 1.18 1.14 1.24  CALCIUM 8.9 8.2* 8.2*    GFR: CrCl cannot be calculated (Unknown ideal weight.). Liver Function Tests: No results for input(s): AST, ALT, ALKPHOS, BILITOT, PROT, ALBUMIN in the last 168 hours. No results for input(s): LIPASE, AMYLASE in the last 168 hours. No results for input(s): AMMONIA in the last 168 hours. Coagulation Profile: No results for input(s): INR, PROTIME in the last 168 hours. Cardiac Enzymes: No results for input(s): CKTOTAL, CKMB, CKMBINDEX, TROPONINI in the last 168 hours. BNP (last 3 results) No results for input(s): PROBNP in the last 8760 hours. HbA1C: No results for input(s): HGBA1C in the last 72 hours. CBG: Recent Labs  Lab 01/16/19 2014  GLUCAP 87   Lipid Profile: No results for input(s): CHOL, HDL, LDLCALC, TRIG, CHOLHDL, LDLDIRECT in the last 72 hours. Thyroid Function Tests: No results for input(s): TSH, T4TOTAL, FREET4, T3FREE, THYROIDAB in the last 72 hours. Anemia Panel: No results for input(s): VITAMINB12, FOLATE, FERRITIN, TIBC, IRON, RETICCTPCT in the last 72 hours. Urine analysis:    Component Value Date/Time   COLORURINE AMBER (A) 01/18/2019 1806   APPEARANCEUR CLEAR 01/18/2019 1806   APPEARANCEUR Clear 03/07/2015 1433   LABSPEC 1.034 (H) 01/18/2019 1806   PHURINE 5.0 01/18/2019 1806   GLUCOSEU NEGATIVE 01/18/2019 1806   HGBUR NEGATIVE 01/18/2019 1806   BILIRUBINUR NEGATIVE 01/18/2019 1806   BILIRUBINUR neg 06/27/2015 1439   BILIRUBINUR Negative 03/07/2015 1433   KETONESUR 5 (A) 01/18/2019 1806   PROTEINUR 30 (A) 01/18/2019 1806   UROBILINOGEN 0.2 06/27/2015 1439   UROBILINOGEN 0.2 11/02/2008 1333   NITRITE NEGATIVE 01/18/2019 1806   LEUKOCYTESUR NEGATIVE 01/18/2019 1806    Radiological Exams on Admission: CT Head Wo Contrast  Result Date: 01/17/2019 CLINICAL DATA:  Patient status post fall today.  Initial encounter. EXAM: CT HEAD WITHOUT CONTRAST TECHNIQUE: Contiguous axial images were obtained from the base of the skull through the vertex without  intravenous contrast. COMPARISON:  Head CT 01/16/2019. FINDINGS: Brain: No evidence of acute infarction, hemorrhage, hydrocephalus, extra-axial collection or mass lesion/mass effect. Atrophy and chronic microvascular ischemic change noted. Vascular: No hyperdense vessel or unexpected calcification. Skull: Intact.  No focal lesion. Sinuses/Orbits: The right maxillary sinus is completely opacified. Scattered ethmoid air  cell disease on the right also noted. Other: None. IMPRESSION: 1. No acute abnormality. 2. Atrophy and chronic microvascular ischemic change. 3. Complete opacification of the right maxillary sinus. Electronically Signed   By: Inge Rise M.D.   On: 01/17/2019 20:07   CT Cervical Spine Wo Contrast  Result Date: 01/17/2019 CLINICAL DATA:  Fall EXAM: CT CERVICAL SPINE WITHOUT CONTRAST TECHNIQUE: Multidetector CT imaging of the cervical spine was performed without intravenous contrast. Multiplanar CT image reconstructions were also generated. COMPARISON:  None. FINDINGS: Alignment: No subluxation Skull base and vertebrae: No acute fracture. No primary bone lesion or focal pathologic process. Soft tissues and spinal canal: No prevertebral fluid or swelling. No visible canal hematoma. Disc levels:  Diffuse degenerative disc and facet disease. Upper chest: No acute findings Other: None IMPRESSION: Diffuse cervical degenerative changes.  No acute bony abnormality. Electronically Signed   By: Rolm Baptise M.D.   On: 01/17/2019 19:19    EKG: Independently reviewed.   Assessment/Plan Active Problems:     Acute hypotension Patient's blood pressure dropped to 80s and improved close to 100 after IV fluids. Continue to monitor  Hypokalemia Potassium repletion with 40 mEq KCl Continue to monitor potassium level and will deplete the potassium as needed.  Covid 19 infection Patient was positive for COVID-19 viral infection on 01/16/19 but asymptomatic. Continue to monitor    DVT  prophylaxis: Lovenox(Lovenox/Heparin/SCD's/anticoagulated/None (if comfort care) Code Status: Full code (Full/Partial (specify details) Family Communication: None (Specify name, relationship. Do not write "discussed with patient". Specify tel # if discussed over the phone) Disposition Plan: Patient will be discharged to assisted living facility most likely in less than 2 days.  (specify when and where you expect patient to be discharged) Consults called: None (with names) Admission status: Observation (inpatient / obs / tele / medical floor / SDU)   Edmonia Lynch MD [facility]   If 7PM-7AM, please contact night-coverage [email or web address]   01/19/2019, 6:44 AM

## 2019-01-19 NOTE — ED Notes (Signed)
Called to give report to facility. Per nurse caring for pt. Facility will not accept pt back due to "family member saying he is being admitting, the pt is dehydrated and needs fluids and can not take his antiniotics and keeps running fevers"   Will make Charge nurse aware. PTAR informed.

## 2019-01-19 NOTE — Telephone Encounter (Signed)
I spoke with Jordan Long, answered questions, updated on events. Jordan Long is being transferred to [facility] for treatment of Covid. I advised having HCPOA contact his treating physician, patient is currently listed as Full Code, but I think family would be more comfortable with Full Scope of Treatment with DNR. She said Nicki Reaper (pateint's son) will call today to clarify that issue.   All other questions answered. Will continue to follow along with coming events.

## 2019-01-19 NOTE — ED Notes (Signed)
PTAR called paperwork printed.  

## 2019-01-19 NOTE — ED Notes (Signed)
Contacted [facility] regarding pt change in plan of care and that pt will be admitted at this time.

## 2019-01-19 NOTE — Progress Notes (Signed)
[name] Onward.  [id number] DOB: 1950-02-08 DOA: 01/18/2019 PCP: Axel Filler, MD    Brief Narrative:  68 year old with a history of chronic venous insufficiency, PE/DVT, Alzheimer's dementia, HLD, and GERD who presented to the ED from his assisted living facility after suffering multiple falls.  He was found to be Covid +12/14 but was not known to have had specific symptoms related to this.  In the ED he denied any complaints whatsoever.  He was however found to be hypotensive and significantly hypokalemic.  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 rate 20, SpO2 96 %.  Intake/Output Summary (Last 24 hours) at 01/19/2019 1141 Last data filed at 01/19/2019 0456 Gross per 24 hour  Intake 1000 ml  Output --  Net 1000 ml   There were no vitals filed for this visit.  Examination: Patient was seen for follow-up visit  CBC: Recent Labs  Lab 01/16/19 1955 01/18/19 1810 01/19/19 0455  WBC 6.7 8.5 6.2  NEUTROABS 4.6 7.4  --   HGB 12.7* 12.2* 10.9*  HCT 39.4 38.3* 34.1*  MCV 91.6 92.1 91.9  PLT 239 211 0000000   Basic Metabolic Panel: Recent Labs  Lab 01/16/19 1955 01/18/19 1810 01/19/19 0455  NA 139 139 141  K  3.9 3.1* 3.3*  CL 105 108 107  CO2 23 23 25   GLUCOSE 101* 130* 84  BUN 27* 27* 28*  CREATININE 1.18 1.14 1.24  CALCIUM 8.9 8.2* 8.2*   GFR: CrCl cannot be calculated (Unknown ideal weight.).  Liver Function Tests: No results for input(s): AST, ALT, ALKPHOS, BILITOT, PROT, ALBUMIN in the last 168 hours. No results for input(s): LIPASE, AMYLASE in the last 168 hours. No results for input(s): AMMONIA in the last 168 hours.  HbA1C: Hemoglobin A1C  Date/Time Value Ref Range Status  03/02/2011 09:53 AM 5.4  Final   Hgb A1c MFr Bld  Date/Time Value Ref Range Status  02/21/2011 09:54 PM 5.7 (H) <5.7 % Final    Comment:    (NOTE)                                                                       According to the ADA Clinical Practice Recommendations for 2011, when HbA1c is used as a screening test:  >=6.5%   Diagnostic of Diabetes Mellitus           (if abnormal result is confirmed) 5.7-6.4%   Increased risk of developing Diabetes Mellitus References:Diagnosis and Classification of Diabetes Mellitus,Diabetes [id number] 1):S62-S69 and Standards of Medical Care in  Diabetes - 2011,Diabetes Care,2011,34 (Suppl 1):S11-S61.     Recent Results (from the past 240 hour(s))  SARS CORONAVIRUS 2 (TAT 6-24 HRS) Nasopharyngeal Nasopharyngeal Swab     Status: Abnormal   Collection Time: 01/16/19 10:05 PM   Specimen: Nasopharyngeal Swab  Result Value Ref Range Status   SARS Coronavirus 2 POSITIVE (A) NEGATIVE Final    Comment: RESULT CALLED TO, READ BACK BY AND VERIFIED WITH: RN OAKLEY APRIL AT [id number] BY MESSAN HOUEGNIFIO ON 01/17/2019 (NOTE) SARS-CoV-2 target nucleic acids are DETECTED. The SARS-CoV-2 RNA is generally detectable in upper and lower respiratory specimens during the acute phase of infection. Positive results are indicative of the presence of SARS-CoV-2 RNA. Clinical correlation with patient history and other diagnostic information is  necessary to determine  patient infection status. Positive results do not rule out bacterial infection or co-infection with other viruses.  The expected result is Negative. Fact Sheet for Patients: SugarRoll.be Fact Sheet for Healthcare Providers: https://www.woods-mathews.com/ This test is not yet approved or cleared by the [place] FDA and  has been authorized for detection and/or diagnosis of SARS-CoV-2 by FDA under an Emergency Use Authorization (EUA). This EUA will remain  in effect (meaning this te st can be used) for the duration of the COVID-19 declaration under Section 564(b)(1) of the Act, 21 U.S.C. section 360bbb-3(b)(1), unless the authorization is terminated or revoked sooner. Performed at [facility], [place] [address] [place], [place] [address]      Scheduled Meds: . dexamethasone (DECADRON) injection  6 mg Intravenous Q24H  . enoxaparin (LOVENOX) injection  30 mg Subcutaneous Q24H   Continuous Infusions: . sodium chloride 75 mL/hr at 01/19/19 0458  . remdesivir 200 mg in sodium chloride 0.9% 250 mL IVPB     Followed by  . [START ON 01/20/2019] remdesivir 100 mg in NS 100 mL       LOS: 0 days   Cherene Altes, MD [facility] Office  [phone] Pager - Text Page per Amion  If 7PM-7AM, please contact night-coverage per Amion 01/19/2019, 11:41 AM

## 2019-01-20 ENCOUNTER — Other Ambulatory Visit: Payer: Self-pay

## 2019-01-20 DIAGNOSIS — R531 Weakness: Secondary | ICD-10-CM

## 2019-01-20 LAB — URINE CULTURE: Culture: 10000 — AB

## 2019-01-20 LAB — CBC
HCT: 36.4 % — ABNORMAL LOW (ref 39.0–52.0)
Hemoglobin: 11.7 g/dL — ABNORMAL LOW (ref 13.0–17.0)
MCH: 29.1 pg (ref 26.0–34.0)
MCHC: 32.1 g/dL (ref 30.0–36.0)
MCV: 90.5 fL (ref 80.0–100.0)
Platelets: 197 10*3/uL (ref 150–400)
RBC: 4.02 MIL/uL — ABNORMAL LOW (ref 4.22–5.81)
RDW: 13.2 % (ref 11.5–15.5)
WBC: 4.9 10*3/uL (ref 4.0–10.5)
nRBC: 0 % (ref 0.0–0.2)

## 2019-01-20 LAB — COMPREHENSIVE METABOLIC PANEL
ALT: 60 U/L — ABNORMAL HIGH (ref 0–44)
AST: 146 U/L — ABNORMAL HIGH (ref 15–41)
Albumin: 2.5 g/dL — ABNORMAL LOW (ref 3.5–5.0)
Alkaline Phosphatase: 59 U/L (ref 38–126)
Anion gap: 8 (ref 5–15)
BUN: 30 mg/dL — ABNORMAL HIGH (ref 8–23)
CO2: 22 mmol/L (ref 22–32)
Calcium: 8.3 mg/dL — ABNORMAL LOW (ref 8.9–10.3)
Chloride: 110 mmol/L (ref 98–111)
Creatinine, Ser: 0.99 mg/dL (ref 0.61–1.24)
GFR calc Af Amer: >60 mL/min (ref >60)
GFR calc non Af Amer: >60 mL/min (ref >60)
Glucose, Bld: 111 mg/dL — ABNORMAL HIGH (ref 70–99)
Potassium: 4.5 mmol/L (ref 3.5–5.1)
Sodium: 140 mmol/L (ref 135–145)
Total Bilirubin: 0.2 mg/dL — ABNORMAL LOW (ref 0.3–1.2)
Total Protein: 5.9 g/dL — ABNORMAL LOW (ref 6.5–8.1)

## 2019-01-20 LAB — MAGNESIUM: Magnesium: 1.8 mg/dL (ref 1.7–2.4)

## 2019-01-20 MED ORDER — TRAMADOL HCL 50 MG PO TABS
50.0000 mg | ORAL_TABLET | Freq: Four times a day (QID) | ORAL | Status: DC | PRN
  Administered 2019-01-21 – 2019-01-22 (×2): 50 mg via ORAL
  Filled 2019-01-20 (×2): qty 1

## 2019-01-20 MED ORDER — ENSURE ENLIVE PO LIQD
237.0000 mL | Freq: Two times a day (BID) | ORAL | Status: DC
  Administered 2019-01-21 – 2019-01-29 (×17): 237 mL via ORAL

## 2019-01-20 MED ORDER — MAGNESIUM SULFATE 2 GM/50ML IV SOLN
2.0000 g | Freq: Once | INTRAVENOUS | Status: AC
  Administered 2019-01-20: 10:00:00 2 g via INTRAVENOUS
  Filled 2019-01-20: qty 50

## 2019-01-20 NOTE — Progress Notes (Signed)
[name] [place].  [id number] DOB: August 04, 1950 DOA: 01/18/2019 PCP: Axel Filler, MD    Brief Narrative:  68 year old with a history of chronic venous insufficiency, PE/DVT, Alzheimer's dementia, HLD, and GERD who presented to the ED from his assisted living facility after suffering multiple falls.  He was found to be Covid +12/14 but was not known to have had specific symptoms related to this.  In the ED he denied any complaints whatsoever.  He was however found to be hypotensive and significantly hypokalemic.  Significant Events: 12/14 COVID+ 12/17 admit via [facility] 12/17 transfer to [facility]  COVID-19 specific Treatment: Remdesivir 12/17 >  Subjective: Blood pressure has stabilized with simple volume expansion.  Oxygen saturations remain entirely normal on room air.  The patient is afebrile.  He is alert interactive and pleasant but confused and impulsive.  Assessment & Plan:  COVID infection  Tested + 12/14 - not requiring O2 support - will cont remdesivir course which was initiated prior to his arrival at 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 rate 18, SpO2 96 %.  Intake/Output Summary (Last 24 hours) at 01/20/2019 1511 Last data filed at 01/20/2019 1316 Gross per 24 hour  Intake 1040 ml  Output --  Net 1040 ml   There were no vitals filed for this visit.  Examination: General: No acute respiratory distress Lungs: Clear to auscultation bilaterally without wheezes or crackles Cardiovascular: Regular rate and rhythm without murmur gallop or rub normal S1 and S2 Abdomen: Nontender, nondistended, soft, bowel sounds positive, no rebound, no ascites, no appreciable mass Extremities: No significant cyanosis, clubbing, or edema bilateral lower extremities   CBC: Recent Labs  Lab 01/16/19 1955 01/18/19 1810 01/19/19 0455 01/20/19 0045  WBC 6.7 8.5 6.2 4.9  NEUTROABS 4.6 7.4  --   --   HGB 12.7* 12.2* 10.9* 11.7*  HCT 39.4 38.3* 34.1* 36.4*  MCV 91.6 92.1 91.9 90.5  PLT 239 211 193 [id number]   Basic Metabolic Panel: Recent Labs  Lab 01/18/19 1810 01/19/19 0455 01/20/19 0045  NA 139 141 140  K 3.1* 3.3* 4.5  CL 108 107 110  CO2 23 25 22   GLUCOSE 130* 84 111*  BUN 27* 28* 30*  CREATININE 1.14 1.24 0.99  CALCIUM 8.2* 8.2* 8.3*  MG  --   --  1.8   GFR: CrCl cannot be calculated (Unknown ideal weight.).  Liver Function Tests: Recent Labs  Lab 01/19/19 0455 01/20/19 0045  AST 135* 146*  ALT 46* 60*  ALKPHOS 52 59  BILITOT 0.8 0.2*  PROT 5.8* 5.9*  ALBUMIN 2.5* 2.5*    HbA1C: Hemoglobin A1C  Date/Time Value Ref Range Status  03/02/2011 09:53 AM 5.4  Final   Hgb A1c MFr Bld  Date/Time Value Ref Range Status  02/21/2011 09:54 PM 5.7 (H) <5.7 % Final    Comment:    (NOTE)                                                                       According to the ADA Clinical Practice Recommendations for 2011, when HbA1c is used as a screening test:  >=6.5%   Diagnostic of Diabetes Mellitus           (if abnormal result is confirmed) 5.7-6.4%   Increased risk of developing Diabetes  [place] References:Diagnosis and Classification of Diabetes Mellitus,Diabetes [id number] 1):S62-S69 and Standards of Medical Care in         Diabetes - 2011,Diabetes [id number] (Suppl 1):S11-S61.     Recent Results (from the past 240 hour(s))  SARS CORONAVIRUS 2 (TAT 6-24 HRS) Nasopharyngeal Nasopharyngeal Swab     Status: Abnormal   Collection Time: 01/16/19 10:05 PM   Specimen: Nasopharyngeal Swab  Result Value Ref Range Status   SARS Coronavirus 2 POSITIVE (A) NEGATIVE Final    Comment: RESULT CALLED TO, READ BACK BY AND VERIFIED WITH: RN OAKLEY APRIL AT [id number] BY MESSAN HOUEGNIFIO ON 01/17/2019 (NOTE) SARS-CoV-2 target nucleic acids are DETECTED. The SARS-CoV-2 RNA is generally detectable in upper and lower respiratory specimens during the acute phase of infection. Positive results are indicative of the presence of SARS-CoV-2 RNA. Clinical correlation with patient history and other diagnostic information is  necessary to determine patient infection status. Positive results do not rule out bacterial infection or co-infection with other viruses.  The expected result is Negative. Fact Sheet for Patients: SugarRoll.be Fact Sheet for Healthcare Providers: https://www.woods-mathews.com/ This test is not yet approved or cleared by the [place] FDA and  has been authorized for detection and/or diagnosis of SARS-CoV-2 by FDA under an Emergency Use Authorization (EUA). This EUA will remain  in effect (meaning this te st can be used) for the duration of the COVID-19 declaration under Section 564(b)(1) of the Act, 21 U.S.C. section 360bbb-3(b)(1), unless the authorization is terminated or revoked sooner. Performed at [facility], [place] [address] [place], [place] [address]   Urine culture     Status: Abnormal   Collection Time: 01/18/19  6:06 PM   Specimen: Urine, Clean Catch  Result Value Ref Range Status   Specimen  Description   Final    URINE, CLEAN CATCH Performed at [facility], [place] [address] [place], [place] [address]    Special Requests   Final    NONE Performed at [facility], [place] [address], [place], [place] [address]    Culture 10,000 COLONIES/mL ENTEROCOCCUS FAECALIS (A)  Final   Report Status 01/20/2019 FINAL  Final   Organism ID, Bacteria ENTEROCOCCUS FAECALIS (A)  Final      Susceptibility   Enterococcus faecalis - MIC*    AMPICILLIN <=2 SENSITIVE Sensitive     NITROFURANTOIN <=16 SENSITIVE Sensitive     VANCOMYCIN 2 SENSITIVE Sensitive     * 10,000 COLONIES/mL ENTEROCOCCUS FAECALIS     Scheduled Meds: . donepezil  10 mg Oral QHS  . DULoxetine  60 mg Oral Daily  . multivitamin with minerals  1 tablet Oral Daily  .  pantoprazole  40 mg Oral Daily  . risperiDONE  0.5 mg Oral QHS  . rivaroxaban  20 mg Oral Q supper   Continuous Infusions: . remdesivir 100 mg in NS 100 mL 100 mg (01/20/19 0931)     LOS: 1 day   Cherene Altes, MD [facility] Office  [phone] Pager - Text Page per Shea Evans  If 7PM-7AM, please contact night-coverage per Amion 01/20/2019, 3:11 PM

## 2019-01-20 NOTE — Progress Notes (Signed)
PT Cancellation Note  Patient Details Name: Jordan Long. MRN: [id number] DOB: [birth year]   Cancelled Treatment:    Reason Eval/Treat Not Completed: Other (comment).  Pt just returned to bed with RN staff.  PT to check back tomorrow to complete eval and get back up.   Thanks,  Verdene Lennert, PT, DPT  Acute Rehabilitation [phone] pager [phone] office  @ [facility]: [phone]     Harvie Heck 01/20/2019, 4:41 PM

## 2019-01-20 NOTE — Evaluation (Signed)
Occupational Therapy Evaluation Patient Details Name: Jordan Long. MRN: [id number] DOB: 03-08-1950 Today's Date: 01/20/2019    History of Present Illness  68 y.o. male with medical history significant of chronic venous insufficiency, PE/DVT, Alzheimer dementia, dyslipidemia and GERD presented to ER from his assisted living facility for evaluation of multiple falls. Patient was tested positive for Covid on 01/16/2019 but without specific symptoms related. Patient was managed in ED for hypotension and hypokalemia, admitted for further observation.    Clinical Impression   This 68 y/o male presents with the above. Pt residing in ALF (memory care) PTA, suspect was completing at least short distance mobility but unable to reach contact at ALF to confirm PLOF today. Pt requiring two person HHA for short distance mobility (approx 4') to recliner this session. He currently requires maxA for LB ADL, minA for seated UB ADL with multimodal cues required to initiate and carry out functional/mobility tasks, overall intermittently following simple commands given increased time/cues. VSS on RA throughout. Pt will benefit from continued acute OT services and currently recommend follow up therapy services in SNF setting (unless ALF able to take pt at current assist level) to maximize his safety and independence with ADL and mobility. Will follow.     Follow Up Recommendations  SNF;Supervision/Assistance - 24 hour(pending assistance ALF able to provide)    Equipment Recommendations  Other (comment)(defer to next venue)           Precautions / Restrictions Precautions Precautions: Fall Precaution Comments: safety sitter Restrictions Weight Bearing Restrictions: No      Mobility Bed Mobility Overal bed mobility: Needs Assistance Bed Mobility: Supine to Sit     Supine to sit: Mod assist     General bed mobility comments: increased cues to initiate and perform task, assist to fully  scoot hips towards EOB and trunk elevation  Transfers Overall transfer level: Needs assistance Equipment used: 2 person hand held assist Transfers: Sit to/from [employer] Transfers Sit to Stand: Mod assist;+2 physical assistance;+2 safety/equipment Stand pivot transfers: Min assist;Mod assist;+2 physical assistance;+2 safety/equipment       General transfer comment: boosting assist from EOB using +2 HHA; min-modA and multimodal cues to initiate and take few steps to recliner     Balance Overall balance assessment: Needs assistance Sitting-balance support: Feet supported Sitting balance-Leahy Scale: Fair     Standing balance support: Bilateral upper extremity supported Standing balance-Leahy Scale: Poor Standing balance comment: reliant on external assist this session                           ADL either performed or assessed with clinical judgement   ADL Overall ADL's : Needs assistance/impaired Eating/Feeding: Set up;Supervision/ safety;Sitting   Grooming: Wash/dry face;Set up;Supervision/safety;Sitting Grooming Details (indicate cue type and reason): mod cues to initiate/carry out task on his own Upper Body Bathing: Minimal assistance;Sitting   Lower Body Bathing: +2 for physical assistance;+2 for safety/equipment;Moderate assistance;Sit to/from stand   Upper Body Dressing : Minimal assistance;Sitting   Lower Body Dressing: Moderate assistance;Maximal assistance;+2 for physical assistance;+2 for safety/equipment;Sit to/from stand   Toilet Transfer: Moderate assistance;+2 for physical assistance;+2 for safety/equipment;Stand-pivot Toilet Transfer Details (indicate cue type and reason): simulated via transfer to recliner, HHA  and multimodal cues         Functional mobility during ADLs: Moderate assistance;+2 for physical assistance;+2 for safety/equipment(HHA, stand pivot transfer) General ADL Comments: pt with weakness, decreased standing balance,  baseline cognitive impairments  Pertinent Vitals/Pain Pain Assessment: No/denies pain     Hand Dominance     Extremity/Trunk Assessment Upper Extremity Assessment Upper Extremity Assessment: Generalized weakness   Lower Extremity Assessment Lower Extremity Assessment: Defer to PT evaluation   Cervical / Trunk Assessment Cervical / Trunk Assessment: Normal   Communication Communication Communication: Other (comment)(at times nonsensical speech)   Cognition Arousal/Alertness: Awake/alert Behavior During Therapy: WFL for tasks assessed/performed;Flat affect Overall Cognitive Status: History of cognitive impairments - at baseline                                 General Comments: pt with dementia at basline, able to tell me his name and DOB, intermittently following commands this session given mulitmodal cues, decreased awareness, processing noted   General Comments  VSS on RA    Exercises     Shoulder Instructions      Home Living Family/patient expects to be discharged to:: Assisted living(from ALF, per chart pt family interested in SNF)                                 Additional Comments: pt from [facility] per chart      Prior Functioning/Environment          Comments: unsure of full PLOF, attempted to call ALF but unable to reach someone for more information, suspect was at least ambulatory for short distances        OT Problem List: Decreased strength;Decreased range of motion;Decreased activity tolerance;Impaired balance (sitting and/or standing);Decreased safety awareness;Decreased knowledge of use of DME or AE;Decreased cognition;Cardiopulmonary status limiting activity      OT Treatment/Interventions: Self-care/ADL training;Therapeutic exercise;Energy conservation;DME and/or AE instruction;Therapeutic activities;Cognitive remediation/compensation;Patient/family education;Balance training     OT Goals(Current goals can be found in the care plan section) Acute Rehab OT Goals Patient Stated Goal: none stated, agreeable to working with therapies  OT Goal Formulation: With patient Time For Goal Achievement: 02/03/19 Potential to Achieve Goals: Good  OT Frequency: Min 2X/week   Barriers to D/C:            Co-evaluation              AM-PAC OT "6 Clicks" Daily Activity     Outcome Measure Help from another person eating meals?: A Little Help from another person taking care of personal grooming?: A Little Help from another person toileting, which includes using toliet, bedpan, or urinal?: A Lot Help from another person bathing (including washing, rinsing, drying)?: A Lot Help from another person to put on and taking off regular upper body clothing?: A Little Help from another person to put on and taking off regular lower body clothing?: A Lot 6 Click Score: 15   End of Session Nurse Communication: Mobility status  Activity Tolerance: Patient tolerated treatment well Patient left: in chair;with call bell/phone within reach;with nursing/sitter in room(RN and sitter present)  OT Visit Diagnosis: Unsteadiness on feet (R26.81);Other symptoms and signs involving cognitive function;Muscle weakness (generalized) (M62.81)                Time: [id number] OT Time Calculation (min): 21 min Charges:  OT General Charges $OT Visit: 1 Visit OT Evaluation $OT Eval Moderate Complexity: 1 Mod  Lou Cal, OT [employer] Pager [phone] Office [phone]   Raymondo Band 01/20/2019, 2:10 PM

## 2019-01-21 LAB — CBC
HCT: 34.6 % — ABNORMAL LOW (ref 39.0–52.0)
Hemoglobin: 11.2 g/dL — ABNORMAL LOW (ref 13.0–17.0)
MCH: 28.9 pg (ref 26.0–34.0)
MCHC: 32.4 g/dL (ref 30.0–36.0)
MCV: 89.2 fL (ref 80.0–100.0)
Platelets: 205 10*3/uL (ref 150–400)
RBC: 3.88 MIL/uL — ABNORMAL LOW (ref 4.22–5.81)
RDW: 13.2 % (ref 11.5–15.5)
WBC: 6.3 10*3/uL (ref 4.0–10.5)
nRBC: 0 % (ref 0.0–0.2)

## 2019-01-21 LAB — COMPREHENSIVE METABOLIC PANEL
ALT: 67 U/L — ABNORMAL HIGH (ref 0–44)
AST: 129 U/L — ABNORMAL HIGH (ref 15–41)
Albumin: 2.4 g/dL — ABNORMAL LOW (ref 3.5–5.0)
Alkaline Phosphatase: 60 U/L (ref 38–126)
Anion gap: 7 (ref 5–15)
BUN: 26 mg/dL — ABNORMAL HIGH (ref 8–23)
CO2: 25 mmol/L (ref 22–32)
Calcium: 8.1 mg/dL — ABNORMAL LOW (ref 8.9–10.3)
Chloride: 106 mmol/L (ref 98–111)
Creatinine, Ser: 1.05 mg/dL (ref 0.61–1.24)
GFR calc Af Amer: >60 mL/min (ref >60)
GFR calc non Af Amer: >60 mL/min (ref >60)
Glucose, Bld: 118 mg/dL — ABNORMAL HIGH (ref 70–99)
Potassium: 4 mmol/L (ref 3.5–5.1)
Sodium: 138 mmol/L (ref 135–145)
Total Bilirubin: 0.3 mg/dL (ref 0.3–1.2)
Total Protein: 5.8 g/dL — ABNORMAL LOW (ref 6.5–8.1)

## 2019-01-21 LAB — MRSA PCR SCREENING: MRSA by PCR: NEGATIVE

## 2019-01-21 LAB — MAGNESIUM: Magnesium: 2.1 mg/dL (ref 1.7–2.4)

## 2019-01-21 MED ORDER — ACETAMINOPHEN 325 MG PO TABS: 650.0000 mg | ORAL_TABLET | Freq: Four times a day (QID) | ORAL | Status: DC | PRN

## 2019-01-21 MED ORDER — SODIUM CHLORIDE 0.9 % IV BOLUS
500.0000 mL | Freq: Once | INTRAVENOUS | Status: AC
  Administered 2019-01-21: 500 mL via INTRAVENOUS

## 2019-01-21 NOTE — Evaluation (Signed)
Physical Therapy Evaluation Patient Details Name: Jordan Long. MRN: [id number] DOB: 07/08/1950 Today's Date: 01/21/2019   History of Present Illness   68 y.o. male with medical history significant of chronic venous insufficiency, PE/DVT, Alzheimer dementia, presented to ER from his assisted living facility for evaluation of multiple falls. Patient was tested positive for Covid on 01/16/2019 but without specific symptoms related. Patient was managed in ED for hypotension and hypokalemia, admitted for further observation.   Clinical Impression  Pt was able to get up OOB to the chair with two person hand held assist this AM.  He has a posterior preference and staggering steps to the recliner chair. He is a high fall risk and per H&P has had multiple recent falls at ALF.  He will likely need post acute rehab to build his strength and balance before returning to ALF level of care.   PT to follow acutely for deficits listed below.      Follow Up Recommendations SNF    Equipment Recommendations  Wheelchair (measurements PT);Wheelchair cushion (measurements PT);3in1 (PT);Hospital bed    Recommendations for Other Services   NA    Precautions / Restrictions Precautions Precautions: Fall Precaution Comments: safety sitter      Mobility  Bed Mobility Overal bed mobility: Needs Assistance Bed Mobility: Supine to Sit     Supine to sit: Mod assist;HOB elevated     General bed mobility comments: mod hand held assist with mulitmodal cues and assist to initiate movement to EOB.   Transfers Overall transfer level: Needs assistance Equipment used: 2 person hand held assist Transfers: Sit to/from [employer] Sit to Stand: +2 physical assistance;Mod assist Stand pivot transfers: +2 physical assistance;Mod assist       General transfer comment: Two person mod assist to stand and take pivotal steps to the chair.  Pt with posterior preference throughout. Needs  manual facilitation to turn and sit down.   Ambulation/Gait             General Gait Details: Likely could ambulate, would be safe with two person assist at pt and another to follow with the chair.          Balance Overall balance assessment: Needs assistance Sitting-balance support: Feet supported;Bilateral upper extremity supported Sitting balance-Leahy Scale: Fair Sitting balance - Comments: close supervision EOB, mild posterior preference Postural control: Posterior lean Standing balance support: Bilateral upper extremity supported Standing balance-Leahy Scale: Poor Standing balance comment: two person assist in standing, posterior preference intensified in standing.                              Pertinent Vitals/Pain Pain Assessment: Faces Pain Score: 0-No pain    Home Living Family/patient expects to be discharged to:: Assisted living                 Additional Comments: pt from [facility] per chart, multiple falls    Prior Function           Comments: unsure of PLOF        Extremity/Trunk Assessment   Upper Extremity Assessment Upper Extremity Assessment: Defer to OT evaluation    Lower Extremity Assessment Lower Extremity Assessment: Generalized weakness    Cervical / Trunk Assessment Cervical / Trunk Assessment: Normal  Communication   Communication: Other (comment)(at times nonsensical, mumbling)  Cognition Arousal/Alertness: Awake/alert Behavior During Therapy: WFL for tasks assessed/performed;Flat affect Overall Cognitive Status: History of cognitive  impairments - at baseline                                 General Comments: baseline dementia, follows visual and tactile cues better than verbal commands for me,             Assessment/Plan    PT Assessment Patient needs continued PT services  PT Problem List Decreased strength;Decreased activity tolerance;Decreased balance;Decreased  mobility;Decreased cognition;Decreased safety awareness;Decreased knowledge of use of DME;Decreased knowledge of precautions;Cardiopulmonary status limiting activity       PT Treatment Interventions DME instruction;Gait training;Functional mobility training;Therapeutic activities;Therapeutic exercise;Neuromuscular re-education;Balance training;Patient/family education;Cognitive remediation    PT Goals (Current goals can be found in the Care Plan section)  Acute Rehab PT Goals Patient Stated Goal: none stated PT Goal Formulation: Patient unable to participate in goal setting Time For Goal Achievement: 02/04/19 Potential to Achieve Goals: Good    Frequency Min 2X/week           AM-PAC PT "6 Clicks" Mobility  Outcome Measure Help needed turning from your back to your side while in a flat bed without using bedrails?: A Lot Help needed moving from lying on your back to sitting on the side of a flat bed without using bedrails?: A Lot Help needed moving to and from a bed to a chair (including a wheelchair)?: A Lot Help needed standing up from a chair using your arms (e.g., wheelchair or bedside chair)?: A Lot Help needed to walk in hospital room?: A Lot Help needed climbing 3-5 steps with a railing? : Total 6 Click Score: 11    End of Session   Activity Tolerance: Patient tolerated treatment well Patient left: in chair;with call bell/phone within reach;with nursing/sitter in room   PT Visit Diagnosis: Muscle weakness (generalized) (M62.81);Difficulty in walking, not elsewhere classified (R26.2);Repeated falls (R29.6)    Time: [id number] PT Time Calculation (min) (ACUTE ONLY): 15 min   Charges:       Verdene Lennert, PT, DPT  Acute Rehabilitation [phone] pager #[phone] office  @ [place]: [phone]   PT Evaluation $PT Eval Moderate Complexity: 1 Mod         01/21/2019, 9:14 AM

## 2019-01-21 NOTE — Progress Notes (Signed)
Jordan Long.  [id number] DOB: [birth year] DOA: 01/18/2019 PCP: Axel Filler, MD    Brief Narrative:  68 year old with a history of chronic venous insufficiency, PE/DVT, Alzheimer's dementia, HLD, and GERD who presented to the ED from his assisted living facility after suffering multiple falls.  He was found to be Covid +12/14 but was not known to have had specific symptoms related to this.  In the ED he denied any complaints whatsoever.  He was however found to be hypotensive and significantly hypokalemic.  Significant Events: 12/14 COVID+ 12/17 admit via [place] ED 12/17 transfer to [facility]  COVID-19 specific Treatment: Remdesivir 12/17 >  Subjective: Oxygen saturations remain normal with no supplemental support.  Blood pressure has trended down somewhat over the last 12 hours, but BUN continues to decline and sodium is normal.  Resting comfortably in a bedside chair with no complaints in no apparent distress.  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 rate 18, height 5\' 7"  (1.702 m), weight 76 kg, SpO2 93 %.  Intake/Output Summary (Last 24 hours) at 01/21/2019 0844 Last data filed at 01/21/2019 0700 Gross per 24 hour  Intake 1140 ml  Output 1895 ml  Net -755 ml   Filed Weights   01/21/19 0230  Weight: 76 kg    Examination: General: NAD Lungs: CTA B  Cardiovascular: RRR Abdomen: NT/ND, soft, bs+, no mass  Extremities: No C/C/E B LE    CBC: Recent Labs  Lab 01/16/19 1955 01/18/19 1810 01/19/19 0455 01/20/19 0045 01/21/19 0215  WBC 6.7 8.5 6.2 4.9 6.3  NEUTROABS 4.6 7.4  --   --   --   HGB 12.7* 12.2* 10.9* 11.7* 11.2*  HCT 39.4 38.3* 34.1* 36.4* 34.6*  MCV 91.6 92.1 91.9 90.5 89.2  PLT 239 211 193 197 [id number]   Basic Metabolic Panel: Recent Labs  Lab 01/19/19 0455 01/20/19 0045 01/21/19 0215  NA 141 140 138  K 3.3* 4.5 4.0  CL 107 110 106  CO2 25 22 25   GLUCOSE 84 111* 118*  BUN 28* 30* 26*  CREATININE 1.24 0.99 1.05  CALCIUM 8.2* 8.3* 8.1*  MG  --  1.8 2.1   GFR: Estimated Creatinine Clearance: 61.2 mL/min (by C-G formula based on SCr of 1.05 mg/dL).  Liver Function Tests: Recent Labs  Lab 01/19/19 0455 01/20/19 0045 01/21/19 0215  AST 135* 146* 129*  ALT 46* 60* 67*  ALKPHOS 52 59 60  BILITOT 0.8 0.2* 0.3  PROT 5.8* 5.9* 5.8*  ALBUMIN 2.5* 2.5* 2.4*    HbA1C: Hemoglobin A1C  Date/Time Value Ref Range Status  03/02/2011 09:53 AM 5.4  Final  Hgb A1c MFr Bld  Date/Time Value Ref Range Status  02/21/2011 09:54 PM 5.7 (H) <5.7 % Final    Comment:    (NOTE)                                                                       According to the ADA Clinical Practice Recommendations for 2011, when HbA1c is used as a screening test:  >=6.5%   Diagnostic of Diabetes Mellitus           (if abnormal result is confirmed) 5.7-6.4%   Increased risk of developing Diabetes Mellitus References:Diagnosis and Classification of Diabetes  [place],Diabetes [id number] 1):S62-S69 and Standards of Medical Care in         Diabetes - 2011,Diabetes P3829181 (Suppl 1):S11-S61.     Recent Results (from the past 240 hour(s))  SARS CORONAVIRUS 2 (TAT 6-24 HRS) Nasopharyngeal Nasopharyngeal Swab     Status: Abnormal   Collection Time: 01/16/19 10:05 PM   Specimen: Nasopharyngeal Swab  Result Value Ref Range Status   SARS Coronavirus 2 POSITIVE (A) NEGATIVE Final    Comment: RESULT CALLED TO, READ BACK BY AND VERIFIED WITH: RN OAKLEY APRIL AT [id number] BY MESSAN HOUEGNIFIO ON 01/17/2019 (NOTE) SARS-CoV-2 target nucleic acids are DETECTED. The SARS-CoV-2 RNA is generally detectable in upper and lower respiratory specimens during the acute phase of infection. Positive results are indicative of the presence of SARS-CoV-2 RNA. Clinical correlation with patient history and other diagnostic information is  necessary to determine patient infection status. Positive results do not rule out bacterial infection or co-infection with other viruses.  The expected result is Negative. Fact Sheet for Patients: SugarRoll.be Fact Sheet for Healthcare Providers: https://www.woods-mathews.com/ This test is not yet approved or cleared by the [place] FDA and  has been authorized for detection and/or diagnosis of SARS-CoV-2 by FDA under an Emergency Use Authorization (EUA). This EUA will remain  in effect (meaning this te st can be used) for the duration of the COVID-19 declaration under Section 564(b)(1) of the Act, 21 U.S.C. section 360bbb-3(b)(1), unless the authorization is terminated or revoked sooner. Performed at [facility], [place] [address] [place], [place] [address]   Urine culture     Status: Abnormal   Collection Time: 01/18/19  6:06 PM   Specimen: Urine, Clean Catch  Result Value Ref Range Status   Specimen Description   Final    URINE, CLEAN CATCH Performed at [facility], [place] [address], [place], [place] [address]    Special Requests   Final    NONE Performed at [facility], [place] [address], [place], [place] [address]    Culture 10,000 COLONIES/mL ENTEROCOCCUS FAECALIS (A)  Final   Report Status 01/20/2019 FINAL  Final   Organism ID, Bacteria ENTEROCOCCUS FAECALIS (A)  Final      Susceptibility   Enterococcus faecalis - MIC*    AMPICILLIN <=2 SENSITIVE Sensitive     NITROFURANTOIN <=16 SENSITIVE Sensitive     VANCOMYCIN 2 SENSITIVE Sensitive     * 10,000 COLONIES/mL ENTEROCOCCUS FAECALIS     Scheduled Meds: . donepezil  10 mg Oral QHS  . DULoxetine  60 mg Oral Daily  . feeding supplement (ENSURE ENLIVE)  237 mL Oral BID BM  . multivitamin with minerals  1 tablet Oral Daily  . pantoprazole  40 mg Oral Daily  . risperiDONE  0.5 mg Oral QHS  . rivaroxaban  20 mg Oral Q supper   Continuous Infusions: . remdesivir 100 mg in NS 100 mL Stopped (01/20/19 1031)     LOS: 2 days   Cherene Altes, MD [facility] Office  [phone] Pager - Text Page per Amion  If 7PM-7AM, please contact night-coverage per Amion 01/21/2019, 8:44 AM

## 2019-01-21 NOTE — Plan of Care (Signed)
  Problem: Education: Goal: Knowledge of risk factors and measures for prevention of condition will improve 01/21/2019 0230 by Heloise Purpura, RN Outcome: Progressing 01/21/2019 0228 by Heloise Purpura, RN Outcome: Progressing   Problem: Coping: Goal: Psychosocial and spiritual needs will be supported 01/21/2019 0230 by Heloise Purpura, RN Outcome: Progressing 01/21/2019 0228 by Heloise Purpura, RN Outcome: Progressing   Problem: Respiratory: Goal: Will maintain a patent airway 01/21/2019 0230 by Heloise Purpura, RN Outcome: Progressing 01/21/2019 0228 by Heloise Purpura, RN Outcome: Progressing Goal: Complications related to the disease process, condition or treatment will be avoided or minimized 01/21/2019 0230 by Heloise Purpura, RN Outcome: Progressing 01/21/2019 0228 by Heloise Purpura, RN Outcome: Progressing

## 2019-01-22 NOTE — Progress Notes (Addendum)
Jordan Long [place].  [id number] DOB: 06-06-1950 DOA: 01/18/2019 PCP: Axel Filler, MD    Brief Narrative:  68 year old with a history of chronic venous insufficiency, PE/DVT, Alzheimer's dementia, HLD, and GERD who presented to the ED from his assisted living facility after suffering multiple falls.  He was found to be Covid +12/14 but was not known to have had specific symptoms related to this.  In the ED he denied any complaints whatsoever.  He was however found to be hypotensive and significantly hypokalemic.  Significant Events: 12/14 COVID+ 12/17 admit via [place] ED 12/17 transfer to [facility]  COVID-19 specific Treatment: Remdesivir 12/17 > 12/21  Date of Positive COVID Test: 01/16/2019 (mild disease/incidental positive essentially)  Date Quarantine Ends: 01/26/2019  Subjective: Resting comfortably in the bedside chair.  Quite pleasant.  No complaints.    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 rate 18, height 5\' 7"  (1.702 m), weight 76 kg, SpO2 94 %.  Intake/Output Summary (Last 24 hours) at 01/22/2019 1018 Last data filed at 01/21/2019 1800 Gross per 24 hour  Intake --  Output 700 ml  Net -700 ml   Filed Weights   01/21/19 0230  Weight: 76 kg    Examination: General: NAD Lungs: CTA B  Cardiovascular: RRR Abdomen: NT/ND, soft, bs+, no mass  Extremities: No C/C/E bilateral lower extremities   CBC: Recent Labs  Lab 01/16/19 1955 01/18/19 1810 01/19/19 0455 01/20/19 0045 01/21/19 0215  WBC 6.7 8.5 6.2 4.9 6.3  NEUTROABS 4.6 7.4  --   --   --   HGB 12.7* 12.2* 10.9* 11.7* 11.2*  HCT 39.4 38.3* 34.1* 36.4* 34.6*  MCV 91.6 92.1 91.9 90.5 89.2  PLT 239 211 193 197 [id number]   Basic Metabolic Panel: Recent Labs  Lab 01/19/19 0455 01/20/19 0045 01/21/19 0215  NA 141 140 138  K 3.3* 4.5 4.0  CL 107 110 106  CO2 25 22 25   GLUCOSE 84 111* 118*  BUN 28* 30* 26*  CREATININE 1.24 0.99 1.05  CALCIUM 8.2* 8.3* 8.1*  MG  --  1.8 2.1   GFR: Estimated Creatinine Clearance: 61.2 mL/min (by C-G formula based on SCr of 1.05 mg/dL).  Liver Function Tests: Recent Labs  Lab 01/19/19 0455 01/20/19 0045 01/21/19 0215  AST 135* 146* 129*  ALT 46* 60* 67*  ALKPHOS 52 59 60  BILITOT 0.8 0.2* 0.3  PROT 5.8* 5.9* 5.8*  ALBUMIN 2.5* 2.5* 2.4*    HbA1C: Hemoglobin A1C  Date/Time Value Ref Range Status  03/02/2011 09:53 AM 5.4  Final   Hgb A1c MFr Bld  Date/Time Value  Ref Range Status  02/21/2011 09:54 PM 5.7 (H) <5.7 % Final    Comment:    (NOTE)                                                                       According to the ADA Clinical Practice Recommendations for 2011, when HbA1c is used as a screening test:  >=6.5%   Diagnostic of Diabetes Mellitus           (if abnormal result is confirmed) 5.7-6.4%   Increased risk of developing Diabetes Mellitus References:Diagnosis and Classification of Diabetes  [place],Diabetes [id number] 1):S62-S69 and Standards of Medical Care in         Diabetes - 2011,Diabetes P3829181 (Suppl 1):S11-S61.     Recent Results (from the past 240 hour(s))  SARS CORONAVIRUS 2 (TAT 6-24 HRS) Nasopharyngeal Nasopharyngeal Swab     Status: Abnormal   Collection Time: 01/16/19 10:05 PM   Specimen: Nasopharyngeal Swab  Result Value Ref Range Status   SARS Coronavirus 2 POSITIVE (A) NEGATIVE Final    Comment: RESULT CALLED TO, READ BACK BY AND VERIFIED WITH: RN OAKLEY APRIL AT [id number] BY MESSAN HOUEGNIFIO ON 01/17/2019 (NOTE) SARS-CoV-2 target nucleic acids are DETECTED. The SARS-CoV-2 RNA is generally detectable in upper and lower respiratory specimens during the acute phase of infection. Positive results are indicative of the presence of SARS-CoV-2 RNA. Clinical correlation with patient history and other diagnostic information is  necessary to determine patient infection status. Positive results do not rule out bacterial infection or co-infection with other viruses.  The expected result is Negative. Fact Sheet for Patients: SugarRoll.be Fact Sheet for Healthcare Providers: https://www.woods-mathews.com/ This test is not yet approved or cleared by the [place] FDA and  has been authorized for detection and/or diagnosis of SARS-CoV-2 by FDA under an Emergency Use Authorization (EUA). This EUA will remain  in effect (meaning this te st can be used) for the duration of the COVID-19 declaration under Section 564(b)(1) of the Act, 21 U.S.C. section 360bbb-3(b)(1), unless the authorization is terminated or revoked sooner. Performed at [facility], [place] [address] [place], [place] [address]   Urine culture     Status: Abnormal   Collection Time: 01/18/19  6:06 PM   Specimen: Urine, Clean Catch  Result Value Ref Range Status   Specimen Description   Final    URINE, CLEAN CATCH Performed at [facility], [place] [address], [place], [place] [address]    Special Requests   Final    NONE Performed at [facility], [place] [address], [place], [place] [address]    Culture 10,000 COLONIES/mL ENTEROCOCCUS FAECALIS (A)  Final   Report Status 01/20/2019 FINAL  Final   Organism ID, Bacteria ENTEROCOCCUS FAECALIS (A)  Final      Susceptibility   Enterococcus faecalis - MIC*    AMPICILLIN <=2 SENSITIVE Sensitive     NITROFURANTOIN <=16 SENSITIVE Sensitive     VANCOMYCIN 2 SENSITIVE Sensitive     * 10,000 COLONIES/mL ENTEROCOCCUS FAECALIS  MRSA PCR Screening     Status: None   Collection Time: 01/20/19 10:53 PM   Specimen: Nasopharyngeal  Result Value Ref Range Status   MRSA by PCR NEGATIVE NEGATIVE  Final    Comment:        The GeneXpert MRSA Assay (FDA approved for NASAL specimens only), is one component of a comprehensive MRSA colonization surveillance program. It is not intended to diagnose MRSA infection nor to guide or monitor treatment for MRSA infections. Performed at [facility], [place] [address], [place], [place] [address]      Scheduled Meds: . donepezil  10 mg Oral QHS  . DULoxetine  60 mg Oral Daily  . feeding supplement (ENSURE ENLIVE)  237 mL Oral BID BM  . multivitamin with minerals  1 tablet Oral Daily  . pantoprazole  40 mg Oral Daily  . risperiDONE  0.5 mg Oral QHS  . rivaroxaban  20 mg Oral Q supper   Continuous Infusions: . remdesivir 100 mg in NS 100 mL 100 mg (01/22/19 0911)     LOS: 3 days   Cherene Altes, MD [facility] Office  [phone] Pager - Text Page per Shea Evans  If 7PM-7AM, please contact night-coverage per Amion 01/22/2019, 10:18 AM

## 2019-01-23 NOTE — Progress Notes (Signed)
Jordan Long.  [id number] DOB: 11-Jul-1950 DOA: 01/18/2019 PCP: Axel Filler, MD    Brief Narrative:  68 year old with a history of chronic venous insufficiency, PE/DVT, Alzheimer's dementia, HLD, and GERD who presented to the ED from his assisted living facility after suffering multiple falls.  He was found to be Covid +12/14 but was not known to have had specific symptoms related to this.  In the ED he denied any complaints whatsoever.  He was however found to be hypotensive and significantly hypokalemic.  Significant Events: 12/14 COVID+ 12/17 admit via [place] ED 12/17 transfer to [facility]  COVID-19 specific Treatment: Remdesivir 12/17 > 12/21  Date of Positive COVID Test: 01/16/2019 (mild disease/incidental positive essentially)  Date Quarantine Ends: 01/26/2019  Subjective: Resting comfortably in bed.  No complaints.  Appears comfortable.  Awaiting SNF placement.  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 rate 18, height 5\' 7"  (1.702 m), weight 76 kg, SpO2 98 %.  Intake/Output Summary (Last 24 hours) at 01/23/2019 0935 Last data filed at 01/23/2019 0300 Gross per 24 hour  Intake -  Output 950 ml  Net -950 ml   Filed Weights   01/21/19 0230  Weight: 76 kg    Examination: General: NAD Lungs: CTA B  Cardiovascular: RRR Abdomen: NT/ND, soft, bs+, no mass  Extremities: No C/C/E bilateral lower extremities   CBC: Recent Labs  Lab 01/16/19 1955 01/18/19 1810 01/19/19 0455 01/20/19 0045 01/21/19 0215  WBC 6.7 8.5 6.2 4.9 6.3  NEUTROABS 4.6 7.4  --   --   --   HGB 12.7* 12.2* 10.9* 11.7* 11.2*  HCT 39.4 38.3* 34.1* 36.4* 34.6*  MCV 91.6 92.1 91.9 90.5 89.2  PLT 239 211 193 197 [id number]   Basic Metabolic Panel: Recent Labs  Lab 01/19/19 0455 01/20/19 0045 01/21/19 0215  NA 141 140 138  K 3.3* 4.5 4.0  CL 107 110 106  CO2 25 22 25   GLUCOSE 84 111* 118*  BUN 28* 30* 26*  CREATININE 1.24 0.99 1.05  CALCIUM 8.2* 8.3* 8.1*  MG  --  1.8 2.1   GFR: Estimated Creatinine Clearance: 61.2 mL/min (by C-G formula based on SCr of 1.05 mg/dL).  Liver Function Tests: Recent Labs  Lab 01/19/19 0455 01/20/19 0045 01/21/19 0215  AST 135* 146* 129*  ALT 46* 60* 67*  ALKPHOS 52 59 60  BILITOT 0.8 0.2* 0.3  PROT 5.8* 5.9* 5.8*  ALBUMIN 2.5* 2.5* 2.4*    HbA1C: Hemoglobin A1C  Date/Time Value Ref Range Status  03/02/2011 09:53 AM 5.4  Final   Hgb A1c MFr Bld  Date/Time Value Ref Range Status  02/21/2011 09:54 PM 5.7 (H) <5.7 % Final    Comment:    (NOTE)                                                                       According to the ADA Clinical Practice Recommendations for 2011, when HbA1c is used as a screening test:  >=6.5%   Diagnostic of Diabetes Mellitus           (if abnormal result is confirmed) 5.7-6.4%   Increased risk of developing Diabetes Mellitus References:Diagnosis and Classification of Diabetes Mellitus,Diabetes  [id number] 1):S62-S69 and Standards of Medical Care in         Diabetes - 2011,Diabetes P3829181 (Suppl 1):S11-S61.     Recent Results (from the past 240 hour(s))  SARS CORONAVIRUS 2 (TAT 6-24 HRS) Nasopharyngeal Nasopharyngeal Swab     Status: Abnormal   Collection Time: 01/16/19 10:05 PM   Specimen: Nasopharyngeal Swab  Result Value Ref Range Status   SARS Coronavirus 2 POSITIVE (A) NEGATIVE Final    Comment: RESULT CALLED TO, READ BACK BY AND VERIFIED WITH: RN OAKLEY APRIL AT [id number] BY MESSAN HOUEGNIFIO ON 01/17/2019 (NOTE) SARS-CoV-2 target nucleic acids are DETECTED. The SARS-CoV-2 RNA is generally detectable in upper and lower respiratory specimens during the acute phase of infection. Positive results are indicative of the presence of SARS-CoV-2 RNA. Clinical correlation with patient history and other diagnostic information is  necessary to determine patient infection status. Positive results do not rule out bacterial infection or co-infection with other viruses.  The expected result is Negative. Fact Sheet for Patients: SugarRoll.be Fact Sheet for Healthcare Providers: https://www.woods-mathews.com/ This test is not yet approved or cleared by the [place] FDA and  has been authorized for detection and/or diagnosis of SARS-CoV-2 by FDA under an Emergency Use Authorization (EUA). This EUA will remain  in effect (meaning this te st can be used) for the duration of the COVID-19 declaration under Section 564(b)(1) of the Act, 21 U.S.C. section 360bbb-3(b)(1), unless the authorization is terminated or revoked sooner. Performed at [facility], [place] [address] [place], [place] [address]   Urine culture     Status: Abnormal   Collection Time: 01/18/19  6:06 PM   Specimen: Urine, Clean Catch  Result Value Ref Range Status   Specimen Description   Final    URINE, CLEAN CATCH Performed at [facility] [place] [address], [place], [place] [address]    Special Requests   Final    NONE Performed at [facility], [place] [address] [place], [place] [address]    Culture 10,000 COLONIES/mL ENTEROCOCCUS FAECALIS (A)  Final   Report Status 01/20/2019 FINAL  Final   Organism ID, Bacteria ENTEROCOCCUS FAECALIS (A)  Final      Susceptibility   Enterococcus faecalis - MIC*    AMPICILLIN <=2 SENSITIVE Sensitive     NITROFURANTOIN <=16 SENSITIVE Sensitive     VANCOMYCIN 2 SENSITIVE Sensitive     * 10,000 COLONIES/mL ENTEROCOCCUS FAECALIS  MRSA PCR Screening     Status: None   Collection Time: 01/20/19 10:53 PM   Specimen: Nasopharyngeal  Result Value Ref Range Status   MRSA by PCR NEGATIVE NEGATIVE Final  Comment:        The GeneXpert MRSA Assay (FDA approved for NASAL specimens only), is one component of a comprehensive MRSA colonization surveillance program. It is not intended to diagnose MRSA infection nor to guide or monitor treatment for MRSA infections. Performed at [facility], [place] [address], [place], [place] [address]      Scheduled Meds: . donepezil  10 mg Oral QHS  . DULoxetine  60 mg Oral Daily  . feeding supplement (ENSURE ENLIVE)  237 mL Oral BID BM  . multivitamin with minerals  1 tablet Oral Daily  . pantoprazole  40 mg Oral Daily  . risperiDONE  0.5 mg Oral QHS  . rivaroxaban  20 mg Oral Q supper   Continuous Infusions: . remdesivir 100 mg in NS 100 mL 100 mg (01/23/19 0919)     LOS: 4 days   Cherene Altes, MD [facility] Office  [phone] Pager - Text Page per Shea Evans  If 7PM-7AM, please contact night-coverage per Amion 01/23/2019, 9:35 AM

## 2019-01-23 NOTE — TOC Initial Note (Signed)
Transition of Care (TOC) - Initial/Assessment Note    Patient Details  Name: Jordan Long. MRN: [id number] Date of Birth: [birth year]  Transition of Care [facility]) CM/SW Contact:    Joaquin Courts, RN Phone Number: 01/23/2019, 2:56 PM  Clinical Narrative:      CM spoke with patient's son david who reports that the patient has been having increased falls at his ALF facility and he has wondered if the patient may need a skilled nursing facility. CM discussed the PT recommendation for SNF for short term rehab prior to returning to ALF. Son expresses that he is in agreement to explore this option. CM provided son with names of facilities that are accepting patient's with Covid+ diagnosis and explained the process for placement. With permission, FL2 was faxed out to area facilities and CM will await bed offers and present once available.               Expected Discharge Plan: Skilled Nursing Facility Barriers to Discharge: Continued Medical Work up   Patient Goals and CMS Choice Patient states their goals for this hospitalization and ongoing recovery are:: per son patient will need to go to rehab CMS Medicare.gov Compare Post Acute Care list provided to:: Patient Represenative (must comment) Choice offered to / list presented to : Adult Children  Expected Discharge Plan and Services Expected Discharge Plan: [place]   Discharge Planning Services: CM Consult Post Acute Care Choice: [place] Living arrangements for the past 2 months: [place]                                      Prior Living Arrangements/Services Living arrangements for the past 2 months: [place] Lives with:: Facility Resident Patient language and need for interpreter reviewed:: Yes Do you feel safe going back to the place where you live?: No   per son patient has had increased falls and they feel he needs to have rehab before  returning to facility  Need for Family Participation in Patient Care: Yes (Comment) Care giver support system in place?: Yes (comment)   Criminal Activity/Legal Involvement Pertinent to Current Situation/Hospitalization: No - Comment as needed  Activities of Daily Living Home Assistive Devices/Equipment: None ADL Screening (condition at time of admission) Patient's cognitive ability adequate to safely complete daily activities?: No Is the patient deaf or have difficulty hearing?: No Does the patient have difficulty seeing, even when wearing glasses/contacts?: No Does the patient have difficulty concentrating, remembering, or making decisions?: Yes Patient able to express need for assistance with ADLs?: No Does the patient have difficulty dressing or bathing?: Yes Independently performs ADLs?: No Communication: Needs assistance Is this a change from baseline?: Pre-admission baseline Dressing (OT): Needs assistance Is this a change from baseline?: Pre-admission baseline Grooming: Needs assistance Is this a change from baseline?: Pre-admission baseline Feeding: Independent Bathing: Needs assistance Is this a change from baseline?: Pre-admission baseline Toileting: Needs assistance, Dependent Is this a change from baseline?: Change from baseline, expected to last <3 days In/Out Bed: Needs assistance Is this a change from baseline?: Change from baseline, expected to last <3 days Walks in Home: Needs assistance Is this a change from baseline?: Pre-admission baseline Does the patient have difficulty walking or climbing stairs?: Yes Weakness of Legs: None Weakness of Arms/Hands: None  Permission Sought/Granted  Emotional Assessment       Orientation: : Oriented to Self   Psych Involvement: No (comment)  Admission diagnosis:  Weakness [R53.1] Hypotension, unspecified hypotension type [I95.9] Acute hypotension [I95.9] COVID-19 virus infection  [U07.1] COVID-19 [U07.1] Patient Active Problem List   Diagnosis Date Noted  . Hypotension 01/19/2019  . Acute hypotension 01/19/2019  . COVID-19 virus infection 01/19/2019  . Excoriation (skin-picking) disorder 08/18/2018  . Pruritus 08/04/2018  . Basal cell carcinoma (BCC) 12/27/2017  . Hypercoagulable state ([place]) 01/20/2016  . Chronic venous insufficiency 06/04/2015  . Alzheimer's dementia ([place]) 12/25/2013  . Dyslipidemia 06/13/2013  . Preventative health care 12/09/2012  . Osteoarthritis of knees, bilateral 07/13/2011  . GERD (gastroesophageal reflux disease) 07/13/2011  . Generalized anxiety disorder 03/02/2011   PCP:  Axel Filler, MD Pharmacy:   [facility], [address] [place]. [place] [address] Phone: [phone] Fax: [facility] [facility] [address] [place] - [phone] [place] [place] [address] Phone: [phone] Fax: [phone]     Social Determinants of Health (SDOH) Interventions    Readmission Risk Interventions No flowsheet data found.

## 2019-01-23 NOTE — NC FL2 (Signed)
[place] LEVEL OF CARE SCREENING TOOL     IDENTIFICATION  Patient Name: Jordan Long. Birthdate: [birth year] Sex: male Admission Date (Current Location): 01/18/2019  [facility] and [place] Number:  Herbalist and Address:  [facility], [place], [place] [address]([facility])      Provider Number: [id number]  Attending Physician Name and Address:  Cherene Altes, MD  Relative Name and Phone Number:       Current Level of Care: Hospital Recommended Level of Care: [place] Prior Approval Number:    Date Approved/Denied:   PASRR Number: pending  Discharge Plan: SNF    Current Diagnoses: Patient Active Problem List   Diagnosis Date Noted  . Hypotension 01/19/2019  . Acute hypotension 01/19/2019  . COVID-19 virus infection 01/19/2019  . Excoriation (skin-picking) disorder 08/18/2018  . Pruritus 08/04/2018  . Basal cell carcinoma (BCC) 12/27/2017  . Hypercoagulable state ([place]) 01/20/2016  . Chronic venous insufficiency 06/04/2015  . Alzheimer's dementia ([place]) 12/25/2013  . Dyslipidemia 06/13/2013  . Preventative health care 12/09/2012  . Osteoarthritis of knees, bilateral 07/13/2011  . GERD (gastroesophageal reflux disease) 07/13/2011  . Generalized anxiety disorder 03/02/2011    Orientation RESPIRATION BLADDER Height & Weight     Self  Normal Incontinent Weight: 76 kg Height:  5\' 7"  (170.2 cm)  BEHAVIORAL SYMPTOMS/MOOD NEUROLOGICAL BOWEL NUTRITION STATUS      Incontinent Diet  AMBULATORY STATUS COMMUNICATION OF NEEDS Skin   Extensive Assist Verbally Normal                       Personal Care Assistance Level of Assistance  Bathing, Feeding, Dressing, Total care Bathing Assistance: Maximum assistance Feeding assistance: Limited assistance Dressing Assistance: Maximum assistance Total Care Assistance: Maximum assistance   Functional Limitations Info             SPECIAL CARE FACTORS FREQUENCY  PT (By licensed PT), OT (By licensed OT)     PT Frequency: 5x weekly OT Frequency: 5x weekly            Contractures Contractures Info: Not present    Additional Factors Info  Code Status, Allergies, Isolation Precautions Code Status Info: full code Allergies Info: NKDA     Isolation Precautions Info: covid + patient     Current Medications (01/23/2019):  This is the current hospital active medication list Current Facility-Administered Medications  Medication Dose Route Frequency Provider Last Rate Last Admin  . acetaminophen (TYLENOL) tablet 650 mg  650 mg Oral Q6H PRN Joette Catching T, MD      . donepezil (ARICEPT) tablet 10 mg  10 mg Oral QHS Cherene Altes, MD   10 mg at 01/22/19 2046  . DULoxetine (CYMBALTA) DR capsule 60 mg  60 mg Oral Daily Cherene Altes, MD   60 mg at 01/23/19 0910  . feeding supplement (ENSURE ENLIVE) (ENSURE ENLIVE) liquid 237 mL  237 mL Oral BID BM Cherene Altes, MD   237 mL at 01/23/19 0910  . multivitamin with minerals tablet 1 tablet  1 tablet Oral Daily Cherene Altes, MD   1 tablet at 01/23/19 0909  . pantoprazole (PROTONIX) EC tablet 40 mg  40 mg Oral Daily Cherene Altes, MD   40 mg at 01/23/19 0910  . risperiDONE (RISPERDAL) tablet 0.5 mg  0.5 mg Oral QHS Cherene Altes, MD   0.5 mg at 01/22/19 2045  .  rivaroxaban (XARELTO) tablet 20 mg  20 mg Oral Q supper Cherene Altes, MD   20 mg at 01/22/19 1705  . traMADol (ULTRAM) tablet 50 mg  50 mg Oral Q6H PRN Cherene Altes, MD   50 mg at 01/22/19 2047     Discharge Medications: Please see discharge summary for a list of discharge medications.  Relevant Imaging Results:  Relevant Lab Results:   Additional Information SSN [id number]  Joaquin Courts, RN

## 2019-01-23 NOTE — Plan of Care (Addendum)
Patient in bed and up to the chair today. No s/s of pain or distress. All medication given well tolerated. Patient remains on room air with o2 sats at 90% and above. Spoke to patient sister [name] with daily update. Will continue to monitor for remainder of shift.    Problem: Education: Goal: Knowledge of risk factors and measures for prevention of condition will improve 01/23/2019 1825 by Orvan Falconer, RN Outcome: Progressing 01/23/2019 1206 by Orvan Falconer, RN Outcome: Progressing   Problem: Coping: Goal: Psychosocial and spiritual needs will be supported 01/23/2019 1825 by Orvan Falconer, RN Outcome: Progressing 01/23/2019 1206 by Orvan Falconer, RN Outcome: Progressing   Problem: Respiratory: Goal: Will maintain a patent airway 01/23/2019 1825 by Orvan Falconer, RN Outcome: Progressing 01/23/2019 1206 by Orvan Falconer, RN Outcome: Progressing Goal: Complications related to the disease process, condition or treatment will be avoided or minimized 01/23/2019 1825 by Orvan Falconer, RN Outcome: Progressing 01/23/2019 1206 by Orvan Falconer, RN Outcome: Progressing

## 2019-01-24 MED ORDER — POLYETHYLENE GLYCOL 3350 17 G PO PACK
17.0000 g | PACK | Freq: Once | ORAL | Status: AC
  Administered 2019-01-24: 20:00:00 17 g via ORAL
  Filled 2019-01-24: qty 1

## 2019-01-24 MED ORDER — HALOPERIDOL 0.5 MG PO TABS
0.5000 mg | ORAL_TABLET | Freq: Three times a day (TID) | ORAL | Status: DC | PRN
  Administered 2019-01-24 – 2019-01-26 (×3): 0.5 mg via ORAL
  Filled 2019-01-24 (×4): qty 1

## 2019-01-24 NOTE — Plan of Care (Addendum)
Patient up to chair for majority of day. No s/s of pain or distress. Patient was a little jittery today MD notified and prn orders for Haldol received; all medication given well tolerated. Will continue to monitor for remainder of shift.   Problem: Education: Goal: Knowledge of risk factors and measures for prevention of condition will improve 01/24/2019 1016 by Orvan Falconer, RN Outcome: Progressing 01/24/2019 0750 by Orvan Falconer, RN Outcome: Progressing   Problem: Coping: Goal: Psychosocial and spiritual needs will be supported 01/24/2019 1016 by Orvan Falconer, RN Outcome: Progressing 01/24/2019 0750 by Orvan Falconer, RN Outcome: Progressing   Problem: Respiratory: Goal: Will maintain a patent airway 01/24/2019 1016 by Orvan Falconer, RN Outcome: Progressing 01/24/2019 0750 by Orvan Falconer, RN Outcome: Progressing Goal: Complications related to the disease process, condition or treatment will be avoided or minimized 01/24/2019 1016 by Orvan Falconer, RN Outcome: Progressing 01/24/2019 0750 by Orvan Falconer, RN Outcome: Progressing

## 2019-01-24 NOTE — Progress Notes (Signed)
Jordan Long.  [id number] DOB: Jun 17, 1950 DOA: 01/18/2019 PCP: Axel Filler, MD    Brief Narrative:  68 year old with a history of chronic venous insufficiency, PE/DVT, Alzheimer's dementia, HLD, and GERD who presented to the ED from his assisted living facility after suffering multiple falls.  He was found to be Covid +12/14 but was not known to have had specific symptoms related to this.  In the ED he denied any complaints whatsoever.  He was however found to be hypotensive and significantly hypokalemic.  Significant Events: 12/14 COVID+ 12/17 admit via [facility] ED 12/17 transfer to [facility]  COVID-19 specific Treatment: Remdesivir 12/17 > 12/21  Date of Positive COVID Test: 01/16/2019 (mild disease/incidental positive essentially)  Date Quarantine Ends: 01/26/2019  Subjective: Sitting up in a bedside chair.  Appears comfortable.  Denies complaints.  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 rate 16, height 5\' 7"  (1.702 m), weight 76 kg, SpO2 96 %.  Intake/Output Summary (Last 24 hours) at 01/24/2019 0801 Last data filed at 01/24/2019 0300 Gross per 24 hour  Intake 120 ml  Output 1475 ml  Net -1355 ml   Filed Weights   01/21/19 0230  Weight: 76 kg    Examination: General: NAD Lungs: CTA B  Cardiovascular: RRR Abdomen: NT/ND, soft, bs+, no mass  Extremities: No C/C/E B LE    CBC: Recent Labs  Lab 01/18/19 1810 01/19/19 0455 01/20/19 0045 01/21/19 0215  WBC 8.5 6.2 4.9 6.3  NEUTROABS 7.4  --   --   --   HGB 12.2* 10.9* 11.7* 11.2*  HCT 38.3* 34.1* 36.4* 34.6*  MCV 92.1 91.9 90.5 89.2  PLT 211 193 197 [id number]   Basic Metabolic Panel: Recent Labs  Lab 01/19/19 0455 01/20/19 0045 01/21/19 0215  NA 141 140 138  K 3.3* 4.5 4.0  CL 107 110 106  CO2 25 22 25   GLUCOSE 84 111* 118*  BUN 28* 30* 26*  CREATININE 1.24 0.99 1.05  CALCIUM 8.2* 8.3* 8.1*  MG  --  1.8 2.1   GFR: Estimated Creatinine Clearance: 61.2 mL/min (by C-G formula based on SCr of 1.05 mg/dL).  Liver Function Tests: Recent Labs  Lab 01/19/19 0455 01/20/19 0045 01/21/19 0215  AST 135* 146* 129*  ALT 46* 60* 67*  ALKPHOS 52 59 60  BILITOT 0.8 0.2* 0.3  PROT 5.8* 5.9* 5.8*  ALBUMIN 2.5* 2.5* 2.4*    HbA1C: Hemoglobin A1C  Date/Time Value Ref Range Status  03/02/2011 09:53 AM 5.4  Final   Hgb A1c MFr Bld  Date/Time Value Ref Range Status  02/21/2011 09:54 PM 5.7 (H) <5.7 % Final  Comment:    (NOTE)                                                                       According to the ADA Clinical Practice Recommendations for 2011, when HbA1c is used as a screening test:  >=6.5%   Diagnostic of Diabetes Mellitus           (if abnormal result is confirmed) 5.7-6.4%   Increased risk of developing Diabetes Mellitus References:Diagnosis and Classification of Diabetes Mellitus,Diabetes [id number] 1):S62-S69 and Standards of Medical Care in         Diabetes -  2011,Diabetes [id number] (Suppl 1):S11-S61.     Recent Results (from the past 240 hour(s))  SARS CORONAVIRUS 2 (TAT 6-24 HRS) Nasopharyngeal Nasopharyngeal Swab     Status: Abnormal   Collection Time: 01/16/19 10:05 PM   Specimen: Nasopharyngeal Swab  Result Value Ref Range Status   SARS Coronavirus 2 POSITIVE (A) NEGATIVE Final    Comment: RESULT CALLED TO, READ BACK BY AND VERIFIED WITH: RN O[place] APRIL AT [id number] BY MESSAN HOUEGNIFIO ON 01/17/2019 (NOTE) SARS-CoV-2 target nucleic acids are DETECTED. The SARS-CoV-2 RNA is generally detectable in upper and lower respiratory specimens during the acute phase of infection. Positive results are indicative of the presence of SARS-CoV-2 RNA. Clinical correlation with patient history and other diagnostic information is  necessary to determine patient infection status. Positive results do not rule out bacterial infection or co-infection with other viruses.  The expected result is Negative. Fact Sheet for Patients: SugarRoll.be Fact Sheet for Healthcare Providers: https://www.woods-mathews.com/ This test is not yet approved or cleared by the [place] FDA and  has been authorized for detection and/or diagnosis of SARS-CoV-2 by FDA under an Emergency Use Authorization (EUA). This EUA will remain  in effect (meaning this te st can be used) for the duration of the COVID-19 declaration under Section 564(b)(1) of the Act, 21 U.S.C. section 360bbb-3(b)(1), unless the authorization is terminated or revoked sooner. Performed at [facility], [place] [address], [place], [place] [address]   Urine culture     Status: Abnormal   Collection Time: 01/18/19  6:06 PM   Specimen: Urine, Clean Catch  Result Value Ref Range Status   Specimen Description   Final    URINE, CLEAN CATCH Performed at [facility], [place] [address] [place], [place] [address]    Special Requests   Final     NONE Performed at [facility], [place] [address], [place], [place] [address]    Culture 10,000 COLONIES/mL ENTEROCOCCUS FAECALIS (A)  Final   Report Status 01/20/2019 FINAL  Final   Organism ID, Bacteria ENTEROCOCCUS FAECALIS (A)  Final      Susceptibility   Enterococcus faecalis - MIC*    AMPICILLIN <=2 SENSITIVE Sensitive     NITROFURANTOIN <=16 SENSITIVE Sensitive     VANCOMYCIN 2 SENSITIVE Sensitive     * 10,000 COLONIES/mL ENTEROCOCCUS FAECALIS  MRSA PCR Screening     Status: None   Collection Time: 01/20/19 10:53 PM   Specimen: Nasopharyngeal  Result Value Ref Range Status   MRSA by PCR NEGATIVE NEGATIVE Final    Comment:        The GeneXpert MRSA  Assay (FDA approved for NASAL specimens only), is one component of a comprehensive MRSA colonization surveillance program. It is not intended to diagnose MRSA infection nor to guide or monitor treatment for MRSA infections. Performed at [facility], [place] [address] [place], [place] [address]      Scheduled Meds: . donepezil  10 mg Oral QHS  . DULoxetine  60 mg Oral Daily  . feeding supplement (ENSURE ENLIVE)  237 mL Oral BID BM  . multivitamin with minerals  1 tablet Oral Daily  . pantoprazole  40 mg Oral Daily  . risperiDONE  0.5 mg Oral QHS  . rivaroxaban  20 mg Oral Q supper     LOS: 5 days   Cherene Altes, MD [facility] Office  [phone] Pager - Text Page per Amion  If 7PM-7AM, please contact night-coverage per Amion 01/24/2019, 8:01 AM

## 2019-01-24 NOTE — TOC Progression Note (Signed)
Transition of Care (TOC) - Progression Note    Patient Details  Name: Jordan Long. MRN: [id number] Date of Birth: May 28, 1950  Transition of [facility]) CM/SW Contact  Joaquin Courts, RN Phone Number: 01/24/2019, 1:58 PM  Clinical Narrative:   Patient has only one bed offer so far, CM spoke with son, Shanon Brow, who expresses he wishes to wait one more day to see if any other facilities respond with an offer. CM called all pending facilities and asked to review patient for possible bed offer. Will continue to follow.      Expected Discharge Plan: [place] Barriers to Discharge: Continued Medical Work up  Expected Discharge Plan and Services Expected Discharge Plan: [place]   Discharge Planning Services: CM Consult Post Acute Care Choice: [place] Living arrangements for the past 2 months: [place]                                       Social Determinants of Health (SDOH) Interventions    Readmission Risk Interventions No flowsheet data found.

## 2019-01-24 NOTE — Progress Notes (Signed)
Physical Therapy Treatment Patient Details Name: Jordan Long. MRN: [id number] DOB: 1950-09-30 Today's Date: 01/24/2019    History of Present Illness  68 y.o. male with medical history significant of chronic venous insufficiency, PE/DVT, Alzheimer dementia, presented to ER from his assisted living facility for evaluation of multiple falls. Patient was tested positive for Covid on 01/16/2019 but without specific symptoms related. Patient was managed in ED for hypotension and hypokalemia, admitted for further observation.     PT Comments    Pt was able to progress to in room gait with two person hand held assist and chair to follow today.  He remains pleasantly confused and has a Oncologist and a chair alarm and a waist alarm.  He remains appropriate for post acute rehab at discharge.  Continue efforts at gait and strengthening exercises.  PT will continue to follow acutely for safe mobility progression.   Follow Up Recommendations  SNF     Equipment Recommendations  3in1 (PT);Hospital bed;Wheelchair cushion (measurements PT);Wheelchair (measurements PT)    Recommendations for Other Services   NA     Precautions / Restrictions Precautions Precautions: Fall Precaution Comments: tele sitter, waist alarm    Mobility  Bed Mobility Overal bed mobility: Needs Assistance Bed Mobility: Supine to Sit     Supine to sit: Min guard     General bed mobility comments: Min guard assist with multi modal cues to initiate movement to EOB.  If given extra time to complete, [name] can do most of the movement himself to get to sitting.   Transfers Overall transfer level: Needs assistance Equipment used: 2 person hand held assist Transfers: Sit to/from Stand Sit to Stand: +2 physical assistance;Mod assist         General transfer comment: Two person mod assist to stand x 2 once from the bed once from the recliner chair.  Pt needed multmodal cues for safe hand placement and to  initiate movement to stand up and then again to sit down.   Ambulation/Gait Ambulation/Gait assistance: +2 physical assistance;Mod assist Gait Distance (Feet): 15 Feet(x2) Assistive device: 2 person hand held assist Gait Pattern/deviations: Step-through pattern;Staggering left;Staggering right;Leaning posteriorly     General Gait Details: Pt with posterior preferenc until forward momentum established.  Staggering pattern, reaching with hands for support.  Pt walked to the door with two person hand held assist (mod) and chair to follow for safety.           Balance Overall balance assessment: Needs assistance Sitting-balance support: Feet supported;Bilateral upper extremity supported Sitting balance-Leahy Scale: Fair Sitting balance - Comments: close supervision for static sitting, anything dynamic EOB pt tips posteriorly.  Postural control: Posterior lean Standing balance support: Bilateral upper extremity supported Standing balance-Leahy Scale: Poor Standing balance comment: two person mod assist in standing.                             Cognition Arousal/Alertness: Awake/alert Behavior During Therapy: WFL for tasks assessed/performed Overall Cognitive Status: History of cognitive impairments - at baseline                                 General Comments: Pleasantly confused, dementia at baseline.      Exercises General Exercises - Upper Extremity Shoulder Flexion: AAROM;Both;10 reps Elbow Flexion: AAROM;Both;10 reps General Exercises - Lower Extremity Long Arc Quad: AROM;Both;10 reps Hip Flexion/Marching: AAROM;Both;10  reps        Pertinent Vitals/Pain Pain Assessment: Faces Faces Pain Scale: No hurt           PT Goals (current goals can now be found in the care plan section) Acute Rehab PT Goals Patient Stated Goal: none stated Progress towards PT goals: Progressing toward goals    Frequency    Min 2X/week      PT Plan  Current plan remains appropriate       AM-PAC PT "6 Clicks" Mobility   Outcome Measure  Help needed turning from your back to your side while in a flat bed without using bedrails?: A Little Help needed moving from lying on your back to sitting on the side of a flat bed without using bedrails?: A Little Help needed moving to and from a bed to a chair (including a wheelchair)?: A Lot Help needed standing up from a chair using your arms (e.g., wheelchair or bedside chair)?: A Lot Help needed to walk in hospital room?: A Lot Help needed climbing 3-5 steps with a railing? : Total 6 Click Score: 13    End of Session Equipment Utilized During Treatment: Gait belt Activity Tolerance: Patient tolerated treatment well Patient left: in chair;with call bell/phone within reach;with chair alarm set;with nursing/sitter in room Nurse Communication: Mobility status PT Visit Diagnosis: Muscle weakness (generalized) (M62.81);Difficulty in walking, not elsewhere classified (R26.2);Repeated falls (R29.6)     Time: [id number] PT Time Calculation (min) (ACUTE ONLY): 39 min  Charges:  $Gait Training: 8-22 mins $Therapeutic Exercise: 8-22 mins $Therapeutic Activity: 8-22 mins                    Verdene Lennert, PT, DPT  Acute Rehabilitation [phone] pager #[phone] office  @ [facility]: [phone]   01/24/2019, 1:20 PM

## 2019-01-24 NOTE — Plan of Care (Signed)
  Problem: Education: Goal: Knowledge of risk factors and measures for prevention of condition will improve Outcome: Progressing   Problem: Coping: Goal: Psychosocial and spiritual needs will be supported Outcome: Progressing   Problem: Respiratory: Goal: Will maintain a patent airway Outcome: Progressing Goal: Complications related to the disease process, condition or treatment will be avoided or minimized Outcome: Progressing   

## 2019-01-25 DIAGNOSIS — E663 Overweight: Secondary | ICD-10-CM | POA: Diagnosis present

## 2019-01-25 DIAGNOSIS — E876 Hypokalemia: Secondary | ICD-10-CM | POA: Diagnosis present

## 2019-01-25 DIAGNOSIS — R7401 Elevation of levels of liver transaminase levels: Secondary | ICD-10-CM | POA: Diagnosis present

## 2019-01-25 DIAGNOSIS — F0280 Dementia in other diseases classified elsewhere without behavioral disturbance: Secondary | ICD-10-CM

## 2019-01-25 DIAGNOSIS — Z86711 Personal history of pulmonary embolism: Secondary | ICD-10-CM

## 2019-01-25 DIAGNOSIS — G309 Alzheimer's disease, unspecified: Secondary | ICD-10-CM

## 2019-01-25 LAB — BASIC METABOLIC PANEL
Anion gap: 7 (ref 5–15)
BUN: 24 mg/dL — ABNORMAL HIGH (ref 8–23)
CO2: 25 mmol/L (ref 22–32)
Calcium: 8.3 mg/dL — ABNORMAL LOW (ref 8.9–10.3)
Chloride: 104 mmol/L (ref 98–111)
Creatinine, Ser: 0.94 mg/dL (ref 0.61–1.24)
GFR calc Af Amer: >60 mL/min (ref >60)
GFR calc non Af Amer: >60 mL/min (ref >60)
Glucose, Bld: 88 mg/dL (ref 70–99)
Potassium: 3.9 mmol/L (ref 3.5–5.1)
Sodium: 136 mmol/L (ref 135–145)

## 2019-01-25 MED ORDER — DOCUSATE SODIUM 100 MG PO CAPS
100.0000 mg | ORAL_CAPSULE | Freq: Every day | ORAL | Status: DC
  Administered 2019-01-25 – 2019-01-29 (×5): 100 mg via ORAL
  Filled 2019-01-25 (×5): qty 1

## 2019-01-25 MED ORDER — POLYETHYLENE GLYCOL 3350 17 G PO PACK
17.0000 g | PACK | Freq: Every day | ORAL | Status: DC | PRN
  Administered 2019-01-25 – 2019-01-26 (×2): 17 g via ORAL
  Filled 2019-01-25 (×2): qty 1

## 2019-01-25 NOTE — TOC Progression Note (Signed)
Transition of Care (TOC) - Progression Note  Marvetta Gibbons RN, BSN Transitions of Care Unit 4E- RN Case Manager ([place]) [phone]   Patient Details  Name: Jordan Long. MRN: [id number] Date of Birth: 10/11/1950  Transition of Care [facility]) CM/SW Contact  Dahlia Client, Romeo Rabon, RN Phone Number: 01/25/2019, 3:50 PM  Clinical Narrative:    Follow TC made to son- Shanon Brow to review SNF bed offers- patient has 2 currently - [facility] and [facility]- son would rather pt be closer to [place] where family lives and is asking if there are any facilities near [place]- only one we are aware of that is taking +COVID pts is [facility]- call has been made to this facility and msg left for admissions. - awaiting return call. Call also made to [facility] spoke with Levada Dy however they have a wait list for beds. Call also made to [facility] admission coordinator- Irine Seal who states that they will have a male bed available 12/24- (pt will need to be tele sitter free for 24 hr prior to transition to SNF). After speaking with son Shanon Brow again- they want to see if [facility] has any beds available before moving forward with [facility] bed offer. CM will f/u with facilities in am and son for final decision on SNF bed.    Expected Discharge Plan: [place] Barriers to Discharge: Continued Medical Work up  Expected Discharge Plan and Services Expected Discharge Plan: [place]   Discharge Planning Services: CM Consult Post Acute Care Choice: [place] Living arrangements for the past 2 months: [place]                                       Social Determinants of Health (SDOH) Interventions    Readmission Risk Interventions No flowsheet data found.

## 2019-01-25 NOTE — Care Management Important Message (Signed)
Important Message  Patient Details  Name: Jordan Long. MRN: [id number] Date of Birth: 31-Mar-1950   Medicare Important Message Given:  Yes - Important Message mailed due to current National Emergency  Verbal consent obtained due to current National Emergency  Relationship to patient: Brother/Sister Contact Name: Ledora Bottcher Call Date: 01/25/19  Time: 1456 Phone: [phone] Outcome: No Answer/Busy Important Message mailed to: Patient address on file    Delorse Lek 01/25/2019, 2:57 PM

## 2019-01-25 NOTE — Plan of Care (Addendum)
Patient up to chair for a good portion of the day. No s/s of pain or distress. Spoke to patient sister [name] with update. Discontinued tele-sitter orders, plans to possibly discharge patient to SNF tomorrow. Will continue to monitor for remainder of shift.  Problem: Education: Goal: Knowledge of risk factors and measures for prevention of condition will improve 01/25/2019 1126 by Orvan Falconer, RN Outcome: Progressing 01/25/2019 1126 by Orvan Falconer, RN Outcome: Progressing   Problem: Coping: Goal: Psychosocial and spiritual needs will be supported 01/25/2019 1126 by Orvan Falconer, RN Outcome: Progressing 01/25/2019 1126 by Orvan Falconer, RN Outcome: Progressing   Problem: Respiratory: Goal: Will maintain a patent airway 01/25/2019 1126 by Orvan Falconer, RN Outcome: Progressing 01/25/2019 1126 by Orvan Falconer, RN Outcome: Progressing Goal: Complications related to the disease process, condition or treatment will be avoided or minimized 01/25/2019 1126 by Orvan Falconer, RN Outcome: Progressing 01/25/2019 1126 by Orvan Falconer, RN Outcome: Progressing

## 2019-01-25 NOTE — Progress Notes (Signed)
PROGRESS NOTE  [name] [place]. [id number] DOB: Jul 01, 1950 DOA: 01/18/2019 PCP: Axel Filler, MD  HPI/Recap of past 78 hours: 68 year old with history of PE on chronic anticoagulation, Alzheimer's dementia and GERD presented to the emergency room from his assisted living facility on 12/16 after having multiple falls.  He previously had been found to be Covid positive on 12/14 but no specific symptoms.  The emergency room, he was found to be hypotensive and hypokalemic and transferred from [facility] to [facility].  Following admission, patient treated with Remdisivir.  He has since improved breathing comfortably.  Evaluated by physical therapy who recommended skilled nursing.  Today, patient has no complaints.  Assessment/Plan: Principal Problem:   COVID-19 virus infection: Stabilized. Active Problems:   Alzheimer's dementia [facility]): Stable, no episodes of acute behavioral disturbance    Acute hypotension: Felt to be secondary to Covid and dehydration.  Resolved with fluids.    Overweight (BMI 25.0-29.9) meets criteria with BMI greater than 25.    Transaminitis: Possibly secondary to minimal shock liver from hypotension versus Covid.  Much improved with minimal elevations by 12/19.    Hypokalemia: Secondary to dehydration.  Resolved with replacement.    History of pulmonary embolus (PE): Continued on anticoagulation.  Code Status: Full code  Family Communication: Left message with family  Disposition Plan: Discharge to skilled nursing 12/24   Consultants:  None  Procedures:  None  Antimicrobials:  None  DVT prophylaxis: Continue Xarelto   Objective: Vitals:   01/24/19 2014 01/25/19 0810  BP: 118/75 (!) 88/65  Pulse: 65 61  Resp:    Temp: (!) 97.5 F (36.4 C) 98 F (36.7 C)  SpO2: 100% 100%    Intake/Output Summary (Last 24 hours) at 01/25/2019 1628 Last data filed at 01/25/2019 0617 Gross per 24 hour  Intake 480 ml  Output  900 ml  Net -420 ml   Filed Weights   01/21/19 0230  Weight: 76 kg   Body mass index is 26.24 kg/m.  Exam:   General: Alert and oriented x2, no acute distress  Cardiovascular: Regular rate and rhythm, S1-S2  Respiratory: Clear to auscultation bilaterally  Abdomen: Soft nontender, nondistended, positive bowel sounds  Musculoskeletal: No clubbing or cyanosis or edema  Data Reviewed: CBC: Recent Labs  Lab 01/18/19 1810 01/19/19 0455 01/20/19 0045 01/21/19 0215  WBC 8.5 6.2 4.9 6.3  NEUTROABS 7.4  --   --   --   HGB 12.2* 10.9* 11.7* 11.2*  HCT 38.3* 34.1* 36.4* 34.6*  MCV 92.1 91.9 90.5 89.2  PLT 211 193 197 [id number]   Basic Metabolic Panel: Recent Labs  Lab 01/18/19 1810 01/19/19 0455 01/20/19 0045 01/21/19 0215 01/25/19 0305  NA 139 141 140 138 136  K 3.1* 3.3* 4.5 4.0 3.9  CL 108 107 110 106 104  CO2 23 25 22 25 25   GLUCOSE 130* 84 111* 118* 88  BUN 27* 28* 30* 26* 24*  CREATININE 1.14 1.24 0.99 1.05 0.94  CALCIUM 8.2* 8.2* 8.3* 8.1* 8.3*  MG  --   --  1.8 2.1  --    GFR: Estimated Creatinine Clearance: 68.4 mL/min (by C-G formula based on SCr of 0.94 mg/dL). Liver Function Tests: Recent Labs  Lab 01/19/19 0455 01/20/19 0045 01/21/19 0215  AST 135* 146* 129*  ALT 46* 60* 67*  ALKPHOS 52 59 60  BILITOT 0.8 0.2* 0.3  PROT 5.8* 5.9* 5.8*  ALBUMIN 2.5* 2.5* 2.4*   No results for input(s): LIPASE, AMYLASE  in the last 168 hours. No results for input(s): AMMONIA in the last 168 hours. Coagulation Profile: No results for input(s): INR, PROTIME in the last 168 hours. Cardiac Enzymes: No results for input(s): CKTOTAL, CKMB, CKMBINDEX, TROPONINI in the last 168 hours. BNP (last 3 results) No results for input(s): PROBNP in the last 8760 hours. HbA1C: No results for input(s): HGBA1C in the last 72 hours. CBG: No results for input(s): GLUCAP in the last 168 hours. Lipid Profile: No results for input(s): CHOL, HDL, LDLCALC, TRIG, CHOLHDL, LDLDIRECT in  the last 72 hours. Thyroid Function Tests: No results for input(s): TSH, T4TOTAL, FREET4, T3FREE, THYROIDAB in the last 72 hours. Anemia Panel: No results for input(s): VITAMINB12, FOLATE, FERRITIN, TIBC, IRON, RETICCTPCT in the last 72 hours. Urine analysis:    Component Value Date/Time   COLORURINE AMBER (A) 01/18/2019 1806   APPEARANCEUR CLEAR 01/18/2019 1806   APPEARANCEUR Clear 03/07/2015 1433   LABSPEC 1.034 (H) 01/18/2019 1806   PHURINE 5.0 01/18/2019 1806   GLUCOSEU NEGATIVE 01/18/2019 1806   HGBUR NEGATIVE 01/18/2019 1806   BILIRUBINUR NEGATIVE 01/18/2019 1806   BILIRUBINUR neg 06/27/2015 1439   BILIRUBINUR Negative 03/07/2015 1433   KETONESUR 5 (A) 01/18/2019 1806   PROTEINUR 30 (A) 01/18/2019 1806   UROBILINOGEN 0.2 06/27/2015 1439   UROBILINOGEN 0.2 11/02/2008 1333   NITRITE NEGATIVE 01/18/2019 1806   LEUKOCYTESUR NEGATIVE 01/18/2019 1806   Sepsis Labs: @LABRCNTIP (procalcitonin:4,lacticidven:4)  ) Recent Results (from the past 240 hour(s))  SARS CORONAVIRUS 2 (TAT 6-24 HRS) Nasopharyngeal Nasopharyngeal Swab     Status: Abnormal   Collection Time: 01/16/19 10:05 PM   Specimen: Nasopharyngeal Swab  Result Value Ref Range Status   SARS Coronavirus 2 POSITIVE (A) NEGATIVE Final    Comment: RESULT CALLED TO, READ BACK BY AND VERIFIED WITH: RN OAKLEY APRIL AT [id number] BY MESSAN HOUEGNIFIO ON 01/17/2019 (NOTE) SARS-CoV-2 target nucleic acids are DETECTED. The SARS-CoV-2 RNA is generally detectable in upper and lower respiratory specimens during the acute phase of infection. Positive results are indicative of the presence of SARS-CoV-2 RNA. Clinical correlation with patient history and other diagnostic information is  necessary to determine patient infection status. Positive results do not rule out bacterial infection or co-infection with other viruses.  The expected result is Negative. Fact Sheet for Patients: SugarRoll.be Fact Sheet for  Healthcare Providers: https://www.woods-mathews.com/ This test is not yet approved or cleared by the [place] FDA and  has been authorized for detection and/or diagnosis of SARS-CoV-2 by FDA under an Emergency Use Authorization (EUA). This EUA will remain  in effect (meaning this te st can be used) for the duration of the COVID-19 declaration under Section 564(b)(1) of the Act, 21 U.S.C. section 360bbb-3(b)(1), unless the authorization is terminated or revoked sooner. Performed at [facility], [place] [address], [place], [place] [address]   Urine culture     Status: Abnormal   Collection Time: 01/18/19  6:06 PM   Specimen: Urine, Clean Catch  Result Value Ref Range Status   Specimen Description   Final    URINE, CLEAN CATCH Performed at [facility], [place] [address] [place], [place] [address]    Special Requests   Final    NONE Performed at [facility], [place] [address] [place], [place] [address]    Culture 10,000 COLONIES/mL ENTEROCOCCUS FAECALIS (A)  Final   Report Status 01/20/2019 FINAL  Final   Organism ID, Bacteria ENTEROCOCCUS FAECALIS (A)  Final  Susceptibility   Enterococcus faecalis - MIC*    AMPICILLIN <=2 SENSITIVE Sensitive     NITROFURANTOIN <=16 SENSITIVE Sensitive     VANCOMYCIN 2 SENSITIVE Sensitive     * 10,000 COLONIES/mL ENTEROCOCCUS FAECALIS  MRSA PCR Screening     Status: None   Collection Time: 01/20/19 10:53 PM   Specimen: Nasopharyngeal  Result Value Ref Range Status   MRSA by PCR NEGATIVE NEGATIVE Final    Comment:        The GeneXpert MRSA Assay (FDA approved for NASAL specimens only), is one component of a comprehensive MRSA colonization surveillance program. It is not intended to diagnose MRSA infection nor to guide or monitor treatment for MRSA infections. Performed at [facility], [place] [address] [place], [place] [address]       Studies: No results  found.  Scheduled Meds: . donepezil  10 mg Oral QHS  . DULoxetine  60 mg Oral Daily  . feeding supplement (ENSURE ENLIVE)  237 mL Oral BID BM  . multivitamin with minerals  1 tablet Oral Daily  . pantoprazole  40 mg Oral Daily  . risperiDONE  0.5 mg Oral QHS  . rivaroxaban  20 mg Oral Q supper    Continuous Infusions:   LOS: 6 days     Annita Brod, MD [facility]  To reach me or the doctor on call, go to: [email or web address] Password [facility]  01/25/2019, 4:28 PM

## 2019-01-25 NOTE — Plan of Care (Signed)
  Problem: Education: Goal: Knowledge of risk factors and measures for prevention of condition will improve Outcome: Progressing   Problem: Coping: Goal: Psychosocial and spiritual needs will be supported Outcome: Progressing   Problem: Respiratory: Goal: Will maintain a patent airway Outcome: Progressing Goal: Complications related to the disease process, condition or treatment will be avoided or minimized Outcome: Progressing   

## 2019-01-26 DIAGNOSIS — E663 Overweight: Secondary | ICD-10-CM

## 2019-01-26 MED ORDER — POLYETHYLENE GLYCOL 3350 17 G PO PACK: 17.0000 g | PACK | Freq: Every day | ORAL | 0 refills | Status: DC | PRN

## 2019-01-26 MED ORDER — ENSURE ENLIVE PO LIQD: 237.0000 mL | Freq: Two times a day (BID) | ORAL | 12 refills | Status: DC

## 2019-01-26 MED ORDER — TRAMADOL HCL 50 MG PO TABS: 50.0000 mg | ORAL_TABLET | Freq: Four times a day (QID) | ORAL | 0 refills | Status: DC | PRN

## 2019-01-26 MED ORDER — RISPERIDONE 0.25 MG PO TABS: 0.2500 mg | ORAL_TABLET | Freq: Every morning | ORAL | 0 refills | Status: DC

## 2019-01-26 MED ORDER — SODIUM CHLORIDE 0.9 % IV BOLUS
500.0000 mL | Freq: Once | INTRAVENOUS | Status: AC
  Administered 2019-01-26: 09:00:00 500 mL via INTRAVENOUS

## 2019-01-26 NOTE — Discharge Instructions (Signed)
COVID-19 COVID-19 is a respiratory infection that is caused by a virus called severe acute respiratory syndrome coronavirus 2 (SARS-CoV-2). The disease is also known as coronavirus disease or novel coronavirus. In some people, the virus may not cause any symptoms. In others, it may cause a serious infection. The infection can get worse quickly and can lead to complications, such as:  Pneumonia, or infection of the lungs.  Acute respiratory distress syndrome or ARDS. This is fluid build-up in the lungs.  Acute respiratory failure. This is a condition in which there is not enough oxygen passing from the lungs to the body.  Sepsis or septic shock. This is a serious bodily reaction to an infection.  Blood clotting problems.  Secondary infections due to bacteria or fungus. The virus that causes COVID-19 is contagious. This means that it can spread from person to person through droplets from coughs and sneezes (respiratory secretions). What are the causes? This illness is caused by a virus. You may catch the virus by:  Breathing in droplets from an infected person's cough or sneeze.  Touching something, like a table or a doorknob, that was exposed to the virus (contaminated) and then touching your mouth, nose, or eyes. What increases the risk? Risk for infection You are more likely to be infected with this virus if you:  Live in or travel to an area with a COVID-19 outbreak.  Come in contact with a sick person who recently traveled to an area with a COVID-19 outbreak.  Provide care for or live with a person who is infected with COVID-19. Risk for serious illness You are more likely to become seriously ill from the virus if you:  Are 42 years of age or older.  Have a long-term disease that lowers your body's ability to fight infection (immunocompromised).  Live in a nursing home or long-term care facility.  Have a long-term (chronic) disease such as: ? Chronic lung disease,  including chronic obstructive pulmonary disease or asthma ? Heart disease. ? Diabetes. ? Chronic kidney disease. ? Liver disease.  Are obese. What are the signs or symptoms? Symptoms of this condition can range from mild to severe. Symptoms may appear any time from 2 to 14 days after being exposed to the virus. They include:  A fever.  A cough.  Difficulty breathing.  Chills.  Muscle pains.  A sore throat.  Loss of taste or smell. Some people may also have stomach problems, such as nausea, vomiting, or diarrhea. Other people may not have any symptoms of COVID-19. How is this diagnosed? This condition may be diagnosed based on:  Your signs and symptoms, especially if: ? You live in an area with a COVID-19 outbreak. ? You recently traveled to or from an area where the virus is common. ? You provide care for or live with a person who was diagnosed with COVID-19.  A physical exam.  Lab tests, which may include: ? A nasal swab to take a sample of fluid from your nose. ? A throat swab to take a sample of fluid from your throat. ? A sample of mucus from your lungs (sputum). ? Blood tests.  Imaging tests, which may include, X-rays, CT scan, or ultrasound. How is this treated? At present, there is no medicine to treat COVID-19. Medicines that treat other diseases are being used on a trial basis to see if they are effective against COVID-19. Your health care provider will talk with you about ways to treat your symptoms. For  most people, the infection is mild and can be managed at home with rest, fluids, and over-the-counter medicines. Treatment for a serious infection usually takes places in a hospital intensive care unit (ICU). It may include one or more of the following treatments. These treatments are given until your symptoms improve.  Receiving fluids and medicines through an IV.  Supplemental oxygen. Extra oxygen is given through a tube in the nose, a face mask, or a  hood.  Positioning you to lie on your stomach (prone position). This makes it easier for oxygen to get into the lungs.  Continuous positive airway pressure (CPAP) or bi-level positive airway pressure (BPAP) machine. This treatment uses mild air pressure to keep the airways open. A tube that is connected to a motor delivers oxygen to the body.  Ventilator. This treatment moves air into and out of the lungs by using a tube that is placed in your windpipe.  Tracheostomy. This is a procedure to create a hole in the neck so that a breathing tube can be inserted.  Extracorporeal membrane oxygenation (ECMO). This procedure gives the lungs a chance to recover by taking over the functions of the heart and lungs. It supplies oxygen to the body and removes carbon dioxide. Follow these instructions at home: Lifestyle  If you are sick, stay home except to get medical care. Your health care provider will tell you how long to stay home. Call your health care provider before you go for medical care.  Rest at home as told by your health care provider.  Do not use any products that contain nicotine or tobacco, such as cigarettes, e-cigarettes, and chewing tobacco. If you need help quitting, ask your health care provider.  Return to your normal activities as told by your health care provider. Ask your health care provider what activities are safe for you. General instructions  Take over-the-counter and prescription medicines only as told by your health care provider.  Drink enough fluid to keep your urine pale yellow.  Keep all follow-up visits as told by your health care provider. This is important. How is this prevented?  There is no vaccine to help prevent COVID-19 infection. However, there are steps you can take to protect yourself and others from this virus. To protect yourself:   Do not travel to areas where COVID-19 is a risk. The areas where COVID-19 is reported change often. To identify  high-risk areas and travel restrictions, check the CDC travel website: wwwnc.cdc.gov/travel/notices  If you live in, or must travel to, an area where COVID-19 is a risk, take precautions to avoid infection. ? Stay away from people who are sick. ? Wash your hands often with soap and water for 20 seconds. If soap and water are not available, use an alcohol-based hand sanitizer. ? Avoid touching your mouth, face, eyes, or nose. ? Avoid going out in public, follow guidance from your state and local health authorities. ? If you must go out in public, wear a cloth face covering or face mask. ? Disinfect objects and surfaces that are frequently touched every day. This may include:  Counters and tables.  Doorknobs and light switches.  Sinks and faucets.  Electronics, such as phones, remote controls, keyboards, computers, and tablets. To protect others: If you have symptoms of COVID-19, take steps to prevent the virus from spreading to others.  If you think you have a COVID-19 infection, contact your health care provider right away. Tell your health care team that you think you   may have a COVID-19 infection.  Stay home. Leave your house only to seek medical care. Do not use public transport.  Do not travel while you are sick.  Wash your hands often with soap and water for 20 seconds. If soap and water are not available, use alcohol-based hand sanitizer.  Stay away from other members of your household. Let healthy household members care for children and pets, if possible. If you have to care for children or pets, wash your hands often and wear a mask. If possible, stay in your own room, separate from others. Use a different bathroom.  Make sure that all people in your household wash their hands well and often.  Cough or sneeze into a tissue or your sleeve or elbow. Do not cough or sneeze into your hand or into the air.  Wear a cloth face covering or face mask. Where to find more  information  Centers for Disease Control and Prevention: PurpleGadgets.be  World Health Organization: https://www.castaneda.info/ Contact a health care provider if:  You live in or have traveled to an area where COVID-19 is a risk and you have symptoms of the infection.  You have had contact with someone who has COVID-19 and you have symptoms of the infection. Get help right away if:  You have trouble breathing.  You have pain or pressure in your chest.  You have confusion.  You have bluish lips and fingernails.  You have difficulty waking from sleep.  You have symptoms that get worse. These symptoms may represent a serious problem that is an emergency. Do not wait to see if the symptoms will go away. Get medical help right away. Call your local emergency services (911 in the [place] Do not drive yourself to the hospital. Let the emergency medical personnel know if you think you have COVID-19. Summary  COVID-19 is a respiratory infection that is caused by a virus. It is also known as coronavirus disease or novel coronavirus. It can cause serious infections, such as pneumonia, acute respiratory distress syndrome, acute respiratory failure, or sepsis.  The virus that causes COVID-19 is contagious. This means that it can spread from person to person through droplets from coughs and sneezes.  You are more likely to develop a serious illness if you are 28 years of age or older, have a weak immunity, live in a nursing home, or have chronic disease.  There is no medicine to treat COVID-19. Your health care provider will talk with you about ways to treat your symptoms.  Take steps to protect yourself and others from infection. Wash your hands often and disinfect objects and surfaces that are frequently touched every day. Stay away from people who are sick and wear a mask if you are sick. This information is not intended to replace advice given to you by  your health care provider. Make sure you discuss any questions you have with your health care provider. Document Released: 02/24/2018 Document Revised: 06/16/2018 Document Reviewed: 02/24/2018 Elsevier Patient Education  2020 [place].    Person Under Monitoring Name: Rontae Vinas.  Location: [address] [place] [address]   Infection Prevention Recommendations for Individuals Confirmed to have, or Being Evaluated for, 2019 Novel Coronavirus (COVID-19) Infection Who Receive Care at Home  Individuals who are confirmed to have, or are being evaluated for, COVID-19 should follow the prevention steps below until a healthcare provider or local or state health department says they can return to normal activities.  Stay home except to get medical care You should restrict activities outside your home, except for getting medical care. Do not go to work, school, or public areas, and do not use public transportation or taxis.  Call ahead before visiting your doctor Before your medical appointment, call the healthcare provider and tell them that you have, or are being evaluated for, COVID-19 infection. This will help the healthcare provider's office take steps to keep other people from getting infected. Ask your healthcare provider to call the local or state health department.  Monitor your symptoms Seek prompt medical attention if your illness is worsening (e.g., difficulty breathing). Before going to your medical appointment, call the healthcare provider and tell them that you have, or are being evaluated for, COVID-19 infection. Ask your healthcare provider to call the local or state health department.  Wear a facemask You should wear a facemask that covers your nose and mouth when you are in the same room with other people and when you visit a healthcare provider. People who live with or visit you should also wear a facemask while they are in the same room  with you.  Separate yourself from other people in your home As much as possible, you should stay in a different room from other people in your home. Also, you should use a separate bathroom, if available.  Avoid sharing household items You should not share dishes, drinking glasses, cups, eating utensils, towels, bedding, or other items with other people in your home. After using these items, you should wash them thoroughly with soap and water.  Cover your coughs and sneezes Cover your mouth and nose with a tissue when you cough or sneeze, or you can cough or sneeze into your sleeve. Throw used tissues in a lined trash can, and immediately wash your hands with soap and water for at least 20 seconds or use an alcohol-based hand rub.  Wash your [facility] your hands often and thoroughly with soap and water for at least 20 seconds. You can use an alcohol-based hand sanitizer if soap and water are not available and if your hands are not visibly dirty. Avoid touching your eyes, nose, and mouth with unwashed hands.   Prevention Steps for Caregivers and Household Members of Individuals Confirmed to have, or Being Evaluated for, COVID-19 Infection Being Cared for in the Home  If you live with, or provide care at home for, a person confirmed to have, or being evaluated for, COVID-19 infection please follow these guidelines to prevent infection:  Follow healthcare provider's instructions Make sure that you understand and can help the patient follow any healthcare provider instructions for all care.  Provide for the patient's basic needs You should help the patient with basic needs in the home and provide support for getting groceries, prescriptions, and other personal needs.  Monitor the patient's symptoms If they are getting sicker, call his or her medical provider and tell them that the patient has, or is being evaluated for, COVID-19 infection. This will help the healthcare provider's  office take steps to keep other people from getting infected. Ask the healthcare provider to call the local or state health department.  Limit the number of people who have contact with the patient  If possible, have only one caregiver for the patient.  Other household members should stay in another home or place of residence. If this is not possible, they should stay  in another room, or be separated from the patient as much as  possible. Use a separate bathroom, if available.  Restrict visitors who do not have an essential need to be in the home.  Keep older adults, very young children, and other sick people away from the patient Keep older adults, very young children, and those who have compromised immune systems or chronic health conditions away from the patient. This includes people with chronic heart, lung, or kidney conditions, diabetes, and cancer.  Ensure good ventilation Make sure that shared spaces in the home have good air flow, such as from an air conditioner or an opened window, weather permitting.  Wash your hands often  Wash your hands often and thoroughly with soap and water for at least 20 seconds. You can use an alcohol based hand sanitizer if soap and water are not available and if your hands are not visibly dirty.  Avoid touching your eyes, nose, and mouth with unwashed hands.  Use disposable paper towels to dry your hands. If not available, use dedicated cloth towels and replace them when they become wet.  Wear a facemask and gloves  Wear a disposable facemask at all times in the room and gloves when you touch or have contact with the patient's blood, body fluids, and/or secretions or excretions, such as sweat, saliva, sputum, nasal mucus, vomit, urine, or feces.  Ensure the mask fits over your nose and mouth tightly, and do not touch it during use.  Throw out disposable facemasks and gloves after using them. Do not reuse.  Wash your hands immediately after  removing your facemask and gloves.  If your personal clothing becomes contaminated, carefully remove clothing and launder. Wash your hands after handling contaminated clothing.  Place all used disposable facemasks, gloves, and other waste in a lined container before disposing them with other household waste.  Remove gloves and wash your hands immediately after handling these items.  Do not share dishes, glasses, or other household items with the patient  Avoid sharing household items. You should not share dishes, drinking glasses, cups, eating utensils, towels, bedding, or other items with a patient who is confirmed to have, or being evaluated for, COVID-19 infection.  After the person uses these items, you should wash them thoroughly with soap and water.  Wash laundry thoroughly  Immediately remove and wash clothes or bedding that have blood, body fluids, and/or secretions or excretions, such as sweat, saliva, sputum, nasal mucus, vomit, urine, or feces, on them.  Wear gloves when handling laundry from the patient.  Read and follow directions on labels of laundry or clothing items and detergent. In general, wash and dry with the warmest temperatures recommended on the label.  Clean all areas the individual has used often  Clean all touchable surfaces, such as counters, tabletops, doorknobs, bathroom fixtures, toilets, phones, keyboards, tablets, and bedside tables, every day. Also, clean any surfaces that may have blood, body fluids, and/or secretions or excretions on them.  Wear gloves when cleaning surfaces the patient has come in contact with.  Use a diluted bleach solution (e.g., dilute bleach with 1 part bleach and 10 parts water) or a household disinfectant with a label that says EPA-registered for coronaviruses. To make a bleach solution at home, add 1 tablespoon of bleach to 1 quart (4 cups) of water. For a larger supply, add  cup of bleach to 1 gallon (16 cups) of  water.  Read labels of cleaning products and follow recommendations provided on product labels. Labels contain instructions for safe and effective use of the cleaning product  including precautions you should take when applying the product, such as wearing gloves or eye protection and making sure you have good ventilation during use of the product.  Remove gloves and wash hands immediately after cleaning.  Monitor yourself for signs and symptoms of illness Caregivers and household members are considered close contacts, should monitor their health, and will be asked to limit movement outside of the home to the extent possible. Follow the monitoring steps for close contacts listed on the symptom monitoring form.   ? If you have additional questions, contact your local health department or call the epidemiologist on call at [phone] (available 24/7). ? This guidance is subject to change. For the most up-to-date guidance from [facility], please refer to their website: [email or web address].pl

## 2019-01-26 NOTE — TOC Progression Note (Signed)
Transition of Care (TOC) - Progression Note  Marvetta Gibbons RN, BSN Transitions of Care Unit 4E- RN Case Manager ([place]) [phone]   Patient Details  Name: Jordan Long. MRN: [id number] Date of Birth: 30-Jul-1950  Transition of Care [facility]) CM/SW Contact  Dahlia Client, Romeo Rabon, RN Phone Number: 01/26/2019, 2:31 PM  Clinical Narrative:    Pt medically stable for transition to SNF per MD. Karen Chafe sitter has been discontinued on 12/23. CM has reached out to SNFs per son-David request to inquire about bed availiblity- [facility]- no beds [place]- no beds [facility]- no reponse [facility]- Bed offered  CM reached out to son Shanon Brow with multiple TC this AM regarding bed offers and choice- family was hopeful for a bed closer to [place] however only bed offer at this time is [facility]- Per Shanon Brow the family is ready to move on and accept the bed offer with [facility] has alerted [place] with [facility] that family has accepted their bed offer. Per Irine Seal pt will need insurance auth which as been submitted today by [facility]. They will hold bed for pt until they have auth- unsure if they will get auth today- Irine Seal will notify when auth received. D/C summary has been sent via hub to [facility].    Expected Discharge Plan: [place] Barriers to Discharge: Barriers Resolved  Expected Discharge Plan and Services Expected Discharge Plan: [place]   Discharge Planning Services: CM Consult Post Acute Care Choice: [place] Living arrangements for the past 2 months: [place] Expected Discharge Date: 01/26/19                                     Social Determinants of Health (SDOH) Interventions    Readmission Risk Interventions No flowsheet data found.

## 2019-01-26 NOTE — Discharge Summary (Signed)
Discharge Summary  Jordan Long. [id number] DOB: [birth year]  PCP: Axel Filler, MD  Admit date: 01/18/2019 Discharge date: 01/26/2019  Time spent: 35 minutes  Recommendations for Outpatient Follow-up:  1. Patient will follow-up with his PCP in approximately 2 months 2. Medication change: Patient previously on as needed OxyContin.  This medication has been discontinued. 3. New medication: Ultram 50 mg p.o. every 6 hours as needed for pain  Discharge Diagnoses:  Active Hospital Problems   Diagnosis Date Noted  . COVID-19 virus infection 01/19/2019  . Overweight (BMI 25.0-29.9) 01/25/2019  . Transaminitis 01/25/2019  . Hypokalemia 01/25/2019  . History of pulmonary embolus (PE) 01/25/2019  . Acute hypotension 01/19/2019  . Alzheimer's dementia [facility]) 12/25/2013    Resolved Hospital Problems  No resolved problems to display.    Discharge Condition: Improved, being discharged to skilled nursing  Diet recommendation: Regular diet  Vitals:   01/25/19 1956 01/26/19 0730  BP: 99/65 (!) 86/58  Pulse: 70   Resp:  16  Temp: 98.5 F (36.9 C) 98.5 F (36.9 C)  SpO2: 98%     History of present illness:  68 year old with history of PE on chronic anticoagulation, Alzheimer's dementia and GERD presented to the emergency room from his assisted living facility on 12/16 after having multiple falls.  He previously had been found to be Covid positive on 12/14 but no specific symptoms.  The emergency room, he was found to be hypotensive and hypokalemic and transferred from [facility] to [facility].   Hospital Course:  Principal Problem:   COVID-19 virus infection: Stabilized.  Following admission, he was treated with 5-day course of IV Remdisivir. Active Problems:   Alzheimer's dementia [facility]): Stable, no episodes of acute behavioral disturbance    Overweight (BMI 25.0-29.9) meets criteria with BMI greater than 25.    Transaminitis: Possibly  secondary to minimal shock liver from hypotension versus Covid.  Much improved with minimal elevations by 12/19.    Hypokalemia: Secondary to dehydration.  Resolved with replacement.    History of pulmonary embolus (PE): Continued on anticoagulation. Hypotension: Initially hypertensive which was felt to be secondary to Covid and dehydration, but following treatment with fluids that since resolved.  On 11/22, for mood, patient also started on as needed Haldol.  He was noted over the next few days to have subsequent morning blood pressures with a systolic in the high [id number].  This medication has been discontinued.  Patient given a fluid bolus following.  Procedures:  Long  Consultations:  Long  Discharge Exam: BP (!) 86/58 (BP Location: Left Arm)   Pulse 70   Temp 98.5 F (36.9 C) (Oral)   Resp 16   Ht 5\' 7"  (1.702 m)   Wt 76 kg   SpO2 98%   BMI 26.24 kg/m   General: Alert and oriented x2, no acute distress Cardiovascular: Regular rate and rhythm, S1-S2 Respiratory: Clear to auscultation bilaterally  Discharge Instructions You were cared for by a hospitalist during your hospital stay. If you have any questions about your discharge medications or the care you received while you were in the hospital after you are discharged, you can call the unit and asked to speak with the hospitalist on call if the hospitalist that took care of you is not available. Once you are discharged, your primary care physician will handle any further medical issues. Please note that NO REFILLS for any discharge medications will be authorized once you are discharged, as it is imperative  that you return to your primary care physician (or establish a relationship with a primary care physician if you do not have one) for your aftercare needs so that they can reassess your need for medications and monitor your lab values.  Discharge Instructions    Diet - low sodium heart healthy   Complete by: As directed     Increase activity slowly   Complete by: As directed      Allergies as of 01/26/2019   No Known Allergies     Medication List    STOP taking these medications   docusate sodium 100 MG capsule Commonly known as: COLACE   doxycycline 100 MG capsule Commonly known as: VIBRAMYCIN   ondansetron 4 MG disintegrating tablet Commonly known as: Zofran ODT   oxyCODONE-acetaminophen 5-325 MG tablet Commonly known as: PERCOCET/ROXICET     TAKE these medications   acetaminophen 500 MG tablet Commonly known as: TYLENOL Take 1 tablet (500 mg total) by mouth every 6 (six) hours as needed for headache.   CeraVe Lotn Apply two times per day as needed for itching and dry skin. What changed:   how to take this  when to take this  additional instructions   donepezil 10 MG tablet Commonly known as: ARICEPT Take 1 tablet (10 mg total) by mouth at bedtime.   DULoxetine 60 MG capsule Commonly known as: CYMBALTA Take 1 capsule (60 mg total) by mouth daily.   feeding supplement (ENSURE ENLIVE) Liqd Take 237 mLs by mouth 2 (two) times daily between meals.   guaiFENesin 600 MG 12 hr tablet Commonly known as: Mucinex Take 1 tablet (600 mg total) by mouth 2 (two) times daily as needed for to loosen phlegm.   Multivitamin Adult Chew Chew 1 tablet by mouth daily.   omeprazole 20 MG capsule Commonly known as: PRILOSEC Take 1 capsule (20 mg total) by mouth daily.   polyethylene glycol 17 g packet Commonly known as: MIRALAX / GLYCOLAX Take 17 g by mouth daily as needed for moderate constipation or severe constipation.   risperiDONE 0.5 MG tablet Commonly known as: RISPERDAL Take 0.5 mg by mouth at bedtime.   risperiDONE 0.25 MG tablet Commonly known as: RISPERDAL Take 1 tablet (0.25 mg total) by mouth every morning.   traMADol 50 MG tablet Commonly known as: ULTRAM Take 1 tablet (50 mg total) by mouth every 6 (six) hours as needed for moderate pain.   Xarelto 20 MG Tabs  tablet Generic drug: rivaroxaban Take 1 tablet (20 mg total) by mouth daily.      No Known Allergies Follow-up Information    Axel Filler, MD. Schedule an appointment as soon as possible for a visit in 2 month(s).   Specialty: Internal Medicine Contact information: [address] [place] [address] [phone]            The results of significant diagnostics from this hospitalization (including imaging, microbiology, ancillary and laboratory) are listed below for reference.    Significant Diagnostic Studies: DG Chest 2 View  Result Date: 01/17/2019 CLINICAL DATA:  Fever. EXAM: CHEST - 2 VIEW COMPARISON:  Chest radiograph 02/21/2011. FINDINGS: Lower lung volumes from prior exam. Patchy and streaky bibasilar opacities. Heart is normal in size with normal mediastinal contours. No pulmonary edema, pleural effusion, or pneumothorax. Chronic wedging of lower thoracic vertebra. No acute osseous abnormalities are seen. Remote right proximal humerus fracture is partially included. IMPRESSION: Patchy and streaky bibasilar opacities, may be atelectasis or pneumonia in  the setting of fever. Electronically Signed   By: Keith Rake M.D.   On: 01/17/2019 00:29   CT Head Wo Contrast  Result Date: 01/17/2019 CLINICAL DATA:  Patient status post fall today.  Initial encounter. EXAM: CT HEAD WITHOUT CONTRAST TECHNIQUE: Contiguous axial images were obtained from the base of the skull through the vertex without intravenous contrast. COMPARISON:  Head CT 01/16/2019. FINDINGS: Brain: No evidence of acute infarction, hemorrhage, hydrocephalus, extra-axial collection or mass lesion/mass effect. Atrophy and chronic microvascular ischemic change noted. Vascular: No hyperdense vessel or unexpected calcification. Skull: Intact.  No focal lesion. Sinuses/Orbits: The right maxillary sinus is completely opacified. Scattered ethmoid air cell disease on the right also noted. Other: Long.  IMPRESSION: 1. No acute abnormality. 2. Atrophy and chronic microvascular ischemic change. 3. Complete opacification of the right maxillary sinus. Electronically Signed   By: Inge Rise M.D.   On: 01/17/2019 20:07   CT Head Wo Contrast  Result Date: 01/16/2019 CLINICAL DATA:  Headache, fall EXAM: CT HEAD WITHOUT CONTRAST TECHNIQUE: Contiguous axial images were obtained from the base of the skull through the vertex without intravenous contrast. COMPARISON:  04/01/2018 FINDINGS: Brain: There is atrophy and chronic small vessel disease changes. No acute intracranial abnormality. Specifically, no hemorrhage, hydrocephalus, mass lesion, acute infarction, or significant intracranial injury. Vascular: No hyperdense vessel or unexpected calcification. Skull: No acute calvarial abnormality. Sinuses/Orbits: Near complete opacification of the right maxillary sinus. Other: Long IMPRESSION: Atrophy, chronic microvascular disease. No acute intracranial abnormality. Right maxillary sinusitis. Electronically Signed   By: Rolm Baptise M.D.   On: 01/16/2019 19:46   CT Cervical Spine Wo Contrast  Result Date: 01/17/2019 CLINICAL DATA:  Fall EXAM: CT CERVICAL SPINE WITHOUT CONTRAST TECHNIQUE: Multidetector CT imaging of the cervical spine was performed without intravenous contrast. Multiplanar CT image reconstructions were also generated. COMPARISON:  Long. FINDINGS: Alignment: No subluxation Skull base and vertebrae: No acute fracture. No primary bone lesion or focal pathologic process. Soft tissues and spinal canal: No prevertebral fluid or swelling. No visible canal hematoma. Disc levels:  Diffuse degenerative disc and facet disease. Upper chest: No acute findings Other: Long IMPRESSION: Diffuse cervical degenerative changes.  No acute bony abnormality. Electronically Signed   By: Rolm Baptise M.D.   On: 01/17/2019 19:19    Microbiology: Recent Results (from the past 240 hour(s))  SARS CORONAVIRUS 2 (TAT 6-24  HRS) Nasopharyngeal Nasopharyngeal Swab     Status: Abnormal   Collection Time: 01/16/19 10:05 PM   Specimen: Nasopharyngeal Swab  Result Value Ref Range Status   SARS Coronavirus 2 POSITIVE (A) NEGATIVE Final    Comment: RESULT CALLED TO, READ BACK BY AND VERIFIED WITH: RN OAKLEY APRIL AT [id number] BY MESSAN HOUEGNIFIO ON 01/17/2019 (NOTE) SARS-CoV-2 target nucleic acids are DETECTED. The SARS-CoV-2 RNA is generally detectable in upper and lower respiratory specimens during the acute phase of infection. Positive results are indicative of the presence of SARS-CoV-2 RNA. Clinical correlation with patient history and other diagnostic information is  necessary to determine patient infection status. Positive results do not rule out bacterial infection or co-infection with other viruses.  The expected result is Negative. Fact Sheet for Patients: SugarRoll.be Fact Sheet for Healthcare Providers: https://www.woods-mathews.com/ This test is not yet approved or cleared by the [place] FDA and  has been authorized for detection and/or diagnosis of SARS-CoV-2 by FDA under an Emergency Use Authorization (EUA). This EUA will remain  in effect (meaning this te st can be used) for the duration  of the COVID-19 declaration under Section 564(b)(1) of the Act, 21 U.S.C. section 360bbb-3(b)(1), unless the authorization is terminated or revoked sooner. Performed at [facility], [place] [address], [place], [place] [address]   Urine culture     Status: Abnormal   Collection Time: 01/18/19  6:06 PM   Specimen: Urine, Clean Catch  Result Value Ref Range Status   Specimen Description   Final    URINE, CLEAN CATCH Performed at [facility], [place] [address] [place], [place] [address]    Special Requests   Final    Long Performed at [facility], [place] [address], [place], [place] [address]    Culture 10,000 COLONIES/mL  ENTEROCOCCUS FAECALIS (A)  Final   Report Status 01/20/2019 FINAL  Final   Organism ID, Bacteria ENTEROCOCCUS FAECALIS (A)  Final      Susceptibility   Enterococcus faecalis - MIC*    AMPICILLIN <=2 SENSITIVE Sensitive     NITROFURANTOIN <=16 SENSITIVE Sensitive     VANCOMYCIN 2 SENSITIVE Sensitive     * 10,000 COLONIES/mL ENTEROCOCCUS FAECALIS  MRSA PCR Screening     Status: Long   Collection Time: 01/20/19 10:53 PM   Specimen: Nasopharyngeal  Result Value Ref Range Status   MRSA by PCR NEGATIVE NEGATIVE Final    Comment:        The GeneXpert MRSA Assay (FDA approved for NASAL specimens only), is one component of a comprehensive MRSA colonization surveillance program. It is not intended to diagnose MRSA infection nor to guide or monitor treatment for MRSA infections. Performed at [facility], [place] [address], [place], [place] [address]      Labs: Basic Metabolic Panel: Recent Labs  Lab 01/20/19 0045 01/21/19 0215 01/25/19 0305  NA 140 138 136  K 4.5 4.0 3.9  CL 110 106 104  CO2 22 25 25   GLUCOSE 111* 118* 88  BUN 30* 26* 24*  CREATININE 0.99 1.05 0.94  CALCIUM 8.3* 8.1* 8.3*  MG 1.8 2.1  --    Liver Function Tests: Recent Labs  Lab 01/20/19 0045 01/21/19 0215  AST 146* 129*  ALT 60* 67*  ALKPHOS 59 60  BILITOT 0.2* 0.3  PROT 5.9* 5.8*  ALBUMIN 2.5* 2.4*   No results for input(s): LIPASE, AMYLASE in the last 168 hours. No results for input(s): AMMONIA in the last 168 hours. CBC: Recent Labs  Lab 01/20/19 0045 01/21/19 0215  WBC 4.9 6.3  HGB 11.7* 11.2*  HCT 36.4* 34.6*  MCV 90.5 89.2  PLT 197 205   Cardiac Enzymes: No results for input(s): CKTOTAL, CKMB, CKMBINDEX, TROPONINI in the last 168 hours. BNP: BNP (last 3 results) No results for input(s): BNP in the last 8760 hours.  ProBNP (last 3 results) No results for input(s): PROBNP in the last 8760 hours.  CBG: No results for input(s): GLUCAP in the last 168  hours.     Signed:  Annita Brod, MD [facility] 01/26/2019, 8:16 AM

## 2019-01-26 NOTE — Plan of Care (Addendum)
Patient up to chair majority of the day. No s/s of pain or distress. All medication given well tolerated. Will continue to monitor for remainder of shift.   Problem: Education: Goal: Knowledge of risk factors and measures for prevention of condition will improve Outcome: Progressing   Problem: Coping: Goal: Psychosocial and spiritual needs will be supported Outcome: Progressing   Problem: Respiratory: Goal: Will maintain a patent airway Outcome: Progressing Goal: Complications related to the disease process, condition or treatment will be avoided or minimized Outcome: Progressing

## 2019-01-27 NOTE — Plan of Care (Signed)
  Problem: Education: Goal: Knowledge of risk factors and measures for prevention of condition will improve Outcome: Progressing   Problem: Coping: Goal: Psychosocial and spiritual needs will be supported Outcome: Progressing   Problem: Respiratory: Goal: Will maintain a patent airway Outcome: Progressing Goal: Complications related to the disease process, condition or treatment will be avoided or minimized Outcome: Progressing   

## 2019-01-27 NOTE — Progress Notes (Signed)
PROGRESS NOTE  [name] Aristes. [id number] DOB: Mar 09, 1950 DOA: 01/18/2019 PCP: Axel Filler, MD  HPI/Recap of past 61 hours: 68 year old with history of PE on chronic anticoagulation, Alzheimer's dementia and GERD presented to the emergency room from his assisted living facility on 12/16 after having multiple falls.  He previously had been found to be Covid positive on 12/14 but no specific symptoms.  The emergency room, he was found to be hypotensive and hypokalemic and transferred from [facility] to [facility].  Following admission, patient treated with Remdisivir.  He has since improved breathing comfortably.  Evaluated by physical therapy who recommended skilled nursing.  Patient was originally scheduled to go to [facility] on 12/24, but we are waiting for insurance approval  Assessment/Plan: Principal Problem:   COVID-19 virus infection: Stabilized. Active Problems:   Alzheimer's dementia [facility]): Stable, no episodes of acute behavioral disturbance    Acute hypotension: Felt to be secondary to Covid and dehydration.  Resolved with fluids.    Overweight (BMI 25.0-29.9) meets criteria with BMI greater than 25.    Transaminitis: Possibly secondary to minimal shock liver from hypotension versus Covid.  Much improved with minimal elevations by 12/19.    Hypokalemia: Secondary to dehydration.  Resolved with replacement.    History of pulmonary embolus (PE): Continued on anticoagulation.  Code Status: Full code  Family Communication: Left message with family  Disposition Plan: Discharge to skilled nursing once insurance approves   Consultants:  None  Procedures:  None  Antimicrobials:  None  DVT prophylaxis: Continue Xarelto   Objective: Vitals:   01/27/19 0445 01/27/19 0940  BP: 103/70 95/71  Pulse: 61 83  Resp:  17  Temp: 98.9 F (37.2 C) 98.4 F (36.9 C)  SpO2: 94% 92%    Intake/Output Summary (Last 24 hours) at 01/27/2019  1343 Last data filed at 01/27/2019 1141 Gross per 24 hour  Intake -  Output 1825 ml  Net -1825 ml   Filed Weights   01/21/19 0230  Weight: 76 kg   Body mass index is 26.24 kg/m.  Exam: Unchanged from previous day  General: Alert and oriented x2, no acute distress  Cardiovascular: Regular rate and rhythm, S1-S2  Respiratory: Clear to auscultation bilaterally  Abdomen: Soft nontender, nondistended, positive bowel sounds  Musculoskeletal: No clubbing or cyanosis or edema  Data Reviewed: CBC: Recent Labs  Lab 01/21/19 0215  WBC 6.3  HGB 11.2*  HCT 34.6*  MCV 89.2  PLT [id number]   Basic Metabolic Panel: Recent Labs  Lab 01/21/19 0215 01/25/19 0305  NA 138 136  K 4.0 3.9  CL 106 104  CO2 25 25  GLUCOSE 118* 88  BUN 26* 24*  CREATININE 1.05 0.94  CALCIUM 8.1* 8.3*  MG 2.1  --    GFR: Estimated Creatinine Clearance: 68.4 mL/min (by C-G formula based on SCr of 0.94 mg/dL). Liver Function Tests: Recent Labs  Lab 01/21/19 0215  AST 129*  ALT 67*  ALKPHOS 60  BILITOT 0.3  PROT 5.8*  ALBUMIN 2.4*   No results for input(s): LIPASE, AMYLASE in the last 168 hours. No results for input(s): AMMONIA in the last 168 hours. Coagulation Profile: No results for input(s): INR, PROTIME in the last 168 hours. Cardiac Enzymes: No results for input(s): CKTOTAL, CKMB, CKMBINDEX, TROPONINI in the last 168 hours. BNP (last 3 results) No results for input(s): PROBNP in the last 8760 hours. HbA1C: No results for input(s): HGBA1C in the last 72 hours. CBG: No results  for input(s): GLUCAP in the last 168 hours. Lipid Profile: No results for input(s): CHOL, HDL, LDLCALC, TRIG, CHOLHDL, LDLDIRECT in the last 72 hours. Thyroid Function Tests: No results for input(s): TSH, T4TOTAL, FREET4, T3FREE, THYROIDAB in the last 72 hours. Anemia Panel: No results for input(s): VITAMINB12, FOLATE, FERRITIN, TIBC, IRON, RETICCTPCT in the last 72 hours. Urine analysis:    Component Value  Date/Time   COLORURINE AMBER (A) 01/18/2019 1806   APPEARANCEUR CLEAR 01/18/2019 1806   APPEARANCEUR Clear 03/07/2015 1433   LABSPEC 1.034 (H) 01/18/2019 1806   PHURINE 5.0 01/18/2019 1806   GLUCOSEU NEGATIVE 01/18/2019 1806   HGBUR NEGATIVE 01/18/2019 1806   BILIRUBINUR NEGATIVE 01/18/2019 1806   BILIRUBINUR neg 06/27/2015 1439   BILIRUBINUR Negative 03/07/2015 1433   KETONESUR 5 (A) 01/18/2019 1806   PROTEINUR 30 (A) 01/18/2019 1806   UROBILINOGEN 0.2 06/27/2015 1439   UROBILINOGEN 0.2 11/02/2008 1333   NITRITE NEGATIVE 01/18/2019 1806   LEUKOCYTESUR NEGATIVE 01/18/2019 1806   Sepsis Labs: @LABRCNTIP (procalcitonin:4,lacticidven:4)  ) Recent Results (from the past 240 hour(s))  Urine culture     Status: Abnormal   Collection Time: 01/18/19  6:06 PM   Specimen: Urine, Clean Catch  Result Value Ref Range Status   Specimen Description   Final    URINE, CLEAN CATCH Performed at [facility], [place] [address], [place], [place] [address]    Special Requests   Final    NONE Performed at [facility], [place] [address] [place], [place] [address]    Culture 10,000 COLONIES/mL ENTEROCOCCUS FAECALIS (A)  Final   Report Status 01/20/2019 FINAL  Final   Organism ID, Bacteria ENTEROCOCCUS FAECALIS (A)  Final      Susceptibility   Enterococcus faecalis - MIC*    AMPICILLIN <=2 SENSITIVE Sensitive     NITROFURANTOIN <=16 SENSITIVE Sensitive     VANCOMYCIN 2 SENSITIVE Sensitive     * 10,000 COLONIES/mL ENTEROCOCCUS FAECALIS  MRSA PCR Screening     Status: None   Collection Time: 01/20/19 10:53 PM   Specimen: Nasopharyngeal  Result Value Ref Range Status   MRSA by PCR NEGATIVE NEGATIVE Final    Comment:        The GeneXpert MRSA Assay (FDA approved for NASAL specimens only), is one component of a comprehensive MRSA colonization surveillance program. It is not intended to diagnose MRSA infection nor to guide or monitor treatment for MRSA  infections. Performed at [facility], [place] [address] [place], [place] [address]       Studies: No results found.  Scheduled Meds: . docusate sodium  100 mg Oral Daily  . donepezil  10 mg Oral QHS  . DULoxetine  60 mg Oral Daily  . feeding supplement (ENSURE ENLIVE)  237 mL Oral BID BM  . multivitamin with minerals  1 tablet Oral Daily  . pantoprazole  40 mg Oral Daily  . risperiDONE  0.5 mg Oral QHS  . rivaroxaban  20 mg Oral Q supper    Continuous Infusions:   LOS: 8 days     Annita Brod, MD [facility]  To reach me or the doctor on call, go to: [email or web address] Password [facility]  01/27/2019, 1:43 PM

## 2019-01-28 NOTE — Plan of Care (Signed)
  Problem: Respiratory: Goal: Will maintain a patent airway Outcome: Progressing Goal: Complications related to the disease process, condition or treatment will be avoided or minimized Outcome: Progressing   Problem: Education: Goal: Knowledge of risk factors and measures for prevention of condition will improve Outcome: Not Progressing   Problem: Coping: Goal: Psychosocial and spiritual needs will be supported Outcome: Not Progressing

## 2019-01-28 NOTE — Progress Notes (Signed)
PROGRESS NOTE  Jordan Long [place]. [id number] DOB: 02/20/1950 DOA: 01/18/2019 PCP: Axel Filler, MD  HPI/Recap of past 59 hours: 68 year old with history of PE on chronic anticoagulation, Alzheimer's dementia and GERD presented to the emergency room from his assisted living facility on 12/16 after having multiple falls.  He previously had been found to be Covid positive on 12/14 but no specific symptoms.  The emergency room, he was found to be hypotensive and hypokalemic and transferred from [facility] to [facility].  Following admission, patient treated with Remdisivir.  He has since improved breathing comfortably.  Evaluated by physical therapy who recommended skilled nursing.  Patient was originally scheduled to go to [facility] on 12/24, but we are waiting for insurance approval.  Patient about the same, no complaints  Assessment/Plan: Principal Problem:   COVID-19 virus infection: Stabilized. Active Problems:   Alzheimer's dementia [facility]): Stable, no episodes of acute behavioral disturbance    Acute hypotension: Felt to be secondary to Covid and dehydration.  Resolved with fluids.    Overweight (BMI 25.0-29.9) meets criteria with BMI greater than 25.    Transaminitis: Possibly secondary to minimal shock liver from hypotension versus Covid.  Much improved with minimal elevations by 12/19.    Hypokalemia: Secondary to dehydration.  Resolved with replacement.    History of pulmonary embolus (PE): Continued on anticoagulation.  Code Status: Full code  Family Communication: Updated family on 12/25  Disposition Plan: Discharge to skilled nursing once insurance approves, likely Monday, 12/28   Consultants:  None  Procedures:  None  Antimicrobials:  None  DVT prophylaxis: Continue Xarelto   Objective: Vitals:   01/28/19 0442 01/28/19 0828  BP: 93/66 99/77  Pulse: 68 80  Resp:  18  Temp: 98.8 F (37.1 C) 98.4 F (36.9 C)  SpO2: 94% 94%     Intake/Output Summary (Last 24 hours) at 01/28/2019 1345 Last data filed at 01/28/2019 1100 Gross per 24 hour  Intake 800 ml  Output 1335 ml  Net -535 ml   Filed Weights   01/21/19 0230  Weight: 76 kg   Body mass index is 26.24 kg/m.  Exam: Unchanged from previous day  General: Alert and oriented x2, no acute distress  Cardiovascular: Regular rate and rhythm, S1-S2  Respiratory: Clear to auscultation bilaterally  Abdomen: Soft nontender, nondistended, positive bowel sounds  Musculoskeletal: No clubbing or cyanosis or edema  Data Reviewed: CBC: No results for input(s): WBC, NEUTROABS, HGB, HCT, MCV, PLT in the last 168 hours. Basic Metabolic Panel: Recent Labs  Lab 01/25/19 0305  NA 136  K 3.9  CL 104  CO2 25  GLUCOSE 88  BUN 24*  CREATININE 0.94  CALCIUM 8.3*   GFR: Estimated Creatinine Clearance: 68.4 mL/min (by C-G formula based on SCr of 0.94 mg/dL). Liver Function Tests: No results for input(s): AST, ALT, ALKPHOS, BILITOT, PROT, ALBUMIN in the last 168 hours. No results for input(s): LIPASE, AMYLASE in the last 168 hours. No results for input(s): AMMONIA in the last 168 hours. Coagulation Profile: No results for input(s): INR, PROTIME in the last 168 hours. Cardiac Enzymes: No results for input(s): CKTOTAL, CKMB, CKMBINDEX, TROPONINI in the last 168 hours. BNP (last 3 results) No results for input(s): PROBNP in the last 8760 hours. HbA1C: No results for input(s): HGBA1C in the last 72 hours. CBG: No results for input(s): GLUCAP in the last 168 hours. Lipid Profile: No results for input(s): CHOL, HDL, LDLCALC, TRIG, CHOLHDL, LDLDIRECT in the last 72  hours. Thyroid Function Tests: No results for input(s): TSH, T4TOTAL, FREET4, T3FREE, THYROIDAB in the last 72 hours. Anemia Panel: No results for input(s): VITAMINB12, FOLATE, FERRITIN, TIBC, IRON, RETICCTPCT in the last 72 hours. Urine analysis:    Component Value Date/Time   COLORURINE AMBER  (A) 01/18/2019 1806   APPEARANCEUR CLEAR 01/18/2019 1806   APPEARANCEUR Clear 03/07/2015 1433   LABSPEC 1.034 (H) 01/18/2019 1806   PHURINE 5.0 01/18/2019 1806   GLUCOSEU NEGATIVE 01/18/2019 1806   HGBUR NEGATIVE 01/18/2019 1806   BILIRUBINUR NEGATIVE 01/18/2019 1806   BILIRUBINUR neg 06/27/2015 1439   BILIRUBINUR Negative 03/07/2015 1433   KETONESUR 5 (A) 01/18/2019 1806   PROTEINUR 30 (A) 01/18/2019 1806   UROBILINOGEN 0.2 06/27/2015 1439   UROBILINOGEN 0.2 11/02/2008 1333   NITRITE NEGATIVE 01/18/2019 1806   LEUKOCYTESUR NEGATIVE 01/18/2019 1806   Sepsis Labs: @LABRCNTIP (procalcitonin:4,lacticidven:4)  ) Recent Results (from the past 240 hour(s))  Urine culture     Status: Abnormal   Collection Time: 01/18/19  6:06 PM   Specimen: Urine, Clean Catch  Result Value Ref Range Status   Specimen Description   Final    URINE, CLEAN CATCH Performed at [facility], [place] [address] [place], [place] [address]    Special Requests   Final    NONE Performed at [facility], [place] [address], [place], [place] [address]    Culture 10,000 COLONIES/mL ENTEROCOCCUS FAECALIS (A)  Final   Report Status 01/20/2019 FINAL  Final   Organism ID, Bacteria ENTEROCOCCUS FAECALIS (A)  Final      Susceptibility   Enterococcus faecalis - MIC*    AMPICILLIN <=2 SENSITIVE Sensitive     NITROFURANTOIN <=16 SENSITIVE Sensitive     VANCOMYCIN 2 SENSITIVE Sensitive     * 10,000 COLONIES/mL ENTEROCOCCUS FAECALIS  MRSA PCR Screening     Status: None   Collection Time: 01/20/19 10:53 PM   Specimen: Nasopharyngeal  Result Value Ref Range Status   MRSA by PCR NEGATIVE NEGATIVE Final    Comment:        The GeneXpert MRSA Assay (FDA approved for NASAL specimens only), is one component of a comprehensive MRSA colonization surveillance program. It is not intended to diagnose MRSA infection nor to guide or monitor treatment for MRSA infections. Performed at [facility], [place] [address] [place], [place] [address]       Studies: No results found.  Scheduled Meds: . docusate sodium  100 mg Oral Daily  . donepezil  10 mg Oral QHS  . DULoxetine  60 mg Oral Daily  . feeding supplement (ENSURE ENLIVE)  237 mL Oral BID BM  . multivitamin with minerals  1 tablet Oral Daily  . pantoprazole  40 mg Oral Daily  . risperiDONE  0.5 mg Oral QHS  . rivaroxaban  20 mg Oral Q supper    Continuous Infusions:   LOS: 9 days     Annita Brod, MD [facility]  To reach me or the doctor on call, go to: [email or web address] Password [email or web address]  01/28/2019, 1:45 PM

## 2019-01-28 NOTE — Plan of Care (Signed)
  Problem: Education: Goal: Knowledge of risk factors and measures for prevention of condition will improve Outcome: Progressing   Problem: Coping: Goal: Psychosocial and spiritual needs will be supported Outcome: Progressing   Problem: Respiratory: Goal: Will maintain a patent airway Outcome: Progressing Goal: Complications related to the disease process, condition or treatment will be avoided or minimized Outcome: Progressing   

## 2019-01-29 DIAGNOSIS — G308 Other Alzheimer's disease: Secondary | ICD-10-CM | POA: Diagnosis not present

## 2019-01-29 DIAGNOSIS — R2689 Other abnormalities of gait and mobility: Secondary | ICD-10-CM | POA: Diagnosis not present

## 2019-01-29 DIAGNOSIS — E876 Hypokalemia: Secondary | ICD-10-CM | POA: Diagnosis not present

## 2019-01-29 DIAGNOSIS — F0280 Dementia in other diseases classified elsewhere without behavioral disturbance: Secondary | ICD-10-CM | POA: Diagnosis not present

## 2019-01-29 DIAGNOSIS — I7389 Other specified peripheral vascular diseases: Secondary | ICD-10-CM | POA: Diagnosis not present

## 2019-01-29 DIAGNOSIS — M2550 Pain in unspecified joint: Secondary | ICD-10-CM | POA: Diagnosis not present

## 2019-01-29 DIAGNOSIS — R404 Transient alteration of awareness: Secondary | ICD-10-CM | POA: Diagnosis not present

## 2019-01-29 DIAGNOSIS — U071 COVID-19: Secondary | ICD-10-CM | POA: Diagnosis not present

## 2019-01-29 DIAGNOSIS — G309 Alzheimer's disease, unspecified: Secondary | ICD-10-CM | POA: Diagnosis not present

## 2019-01-29 DIAGNOSIS — R278 Other lack of coordination: Secondary | ICD-10-CM | POA: Diagnosis not present

## 2019-01-29 DIAGNOSIS — E860 Dehydration: Secondary | ICD-10-CM | POA: Diagnosis not present

## 2019-01-29 DIAGNOSIS — M6281 Muscle weakness (generalized): Secondary | ICD-10-CM | POA: Diagnosis not present

## 2019-01-29 DIAGNOSIS — R1312 Dysphagia, oropharyngeal phase: Secondary | ICD-10-CM | POA: Diagnosis not present

## 2019-01-29 DIAGNOSIS — I959 Hypotension, unspecified: Secondary | ICD-10-CM | POA: Diagnosis not present

## 2019-01-29 DIAGNOSIS — E663 Overweight: Secondary | ICD-10-CM | POA: Diagnosis not present

## 2019-01-29 DIAGNOSIS — J320 Chronic maxillary sinusitis: Secondary | ICD-10-CM | POA: Diagnosis not present

## 2019-01-29 DIAGNOSIS — Z86711 Personal history of pulmonary embolism: Secondary | ICD-10-CM | POA: Diagnosis not present

## 2019-01-29 DIAGNOSIS — M5090 Cervical disc disorder, unspecified, unspecified cervical region: Secondary | ICD-10-CM | POA: Diagnosis not present

## 2019-01-29 DIAGNOSIS — Z7401 Bed confinement status: Secondary | ICD-10-CM | POA: Diagnosis not present

## 2019-01-29 DIAGNOSIS — R7401 Elevation of levels of liver transaminase levels: Secondary | ICD-10-CM | POA: Diagnosis not present

## 2019-01-29 DIAGNOSIS — R262 Difficulty in walking, not elsewhere classified: Secondary | ICD-10-CM | POA: Diagnosis not present

## 2019-01-29 DIAGNOSIS — I2782 Chronic pulmonary embolism: Secondary | ICD-10-CM | POA: Diagnosis not present

## 2019-01-29 DIAGNOSIS — R41841 Cognitive communication deficit: Secondary | ICD-10-CM | POA: Diagnosis not present

## 2019-01-29 LAB — CBC
HCT: 37.5 % — ABNORMAL LOW (ref 39.0–52.0)
Hemoglobin: 12.1 g/dL — ABNORMAL LOW (ref 13.0–17.0)
MCH: 28.9 pg (ref 26.0–34.0)
MCHC: 32.3 g/dL (ref 30.0–36.0)
MCV: 89.5 fL (ref 80.0–100.0)
Platelets: 366 10*3/uL (ref 150–400)
RBC: 4.19 MIL/uL — ABNORMAL LOW (ref 4.22–5.81)
RDW: 13.2 % (ref 11.5–15.5)
WBC: 7.2 10*3/uL (ref 4.0–10.5)
nRBC: 0 % (ref 0.0–0.2)

## 2019-01-29 NOTE — Progress Notes (Signed)
Attempted to call report to [facility] x2 no answer.

## 2019-01-29 NOTE — TOC Transition Note (Signed)
Transition of Care [facility]) - CM/SW Discharge Note   Patient Details  Name: Dex Ganschow. MRN: [id number] Date of Birth: 1950-08-16  Transition of Care [facility]) CM/SW Contact:  Geralynn Ochs, LCSW Phone Number: 01/29/2019, 11:25 AM   Clinical Narrative:  Nurse to call report to [phone], Room 801A.  Transport requested for 2:00 PM.     Final next level of care: [place] Barriers to Discharge: Barriers Resolved   Patient Goals and CMS Choice Patient states their goals for this hospitalization and ongoing recovery are:: per son patient will need to go to rehab CMS Medicare.gov Compare Post Acute Care list provided to:: Patient Represenative (must comment) Choice offered to / list presented to : Adult Children  Discharge Placement              Patient chooses bed at: [facility] Patient to be transferred to facility by: [facility] Name of family member notified: Shanon Brow Patient and family notified of of transfer: 01/29/19  Discharge Plan and Services   Discharge Planning Services: CM Consult Post Acute Care Choice: [place]                               Social Determinants of Health (SDOH) Interventions     Readmission Risk Interventions No flowsheet data found.

## 2019-01-29 NOTE — Progress Notes (Signed)
Called [facility] on status of pickup, they advised they never received request for transport.  I put request in, they will come as soon as they can to get the Pt and transport to [facility].

## 2019-01-29 NOTE — Discharge Summary (Signed)
Discharge Summary  Jordan Long Jordan Long. [id number] DOB: [birth year]  PCP: Axel Filler, MD  Admit date: 01/18/2019 Discharge date: 01/29/2019  Time spent: 25 minutes  Recommendations for Outpatient Follow-up:  1. Patient will follow-up with his PCP in approximately 2 months 2. Medication change: Patient previously on as needed OxyContin.  This medication has been discontinued. 3. New medication: Ultram 50 mg p.o. every 6 hours as needed for pain  Discharge Diagnoses:  Active Hospital Problems   Diagnosis Date Noted   COVID-19 virus infection 01/19/2019   Overweight (BMI 25.0-29.9) 01/25/2019   Transaminitis 01/25/2019   Hypokalemia 01/25/2019   History of pulmonary embolus (PE) 01/25/2019   Acute hypotension 01/19/2019   Alzheimer's dementia ([place]) 12/25/2013    Resolved Hospital Problems  No resolved problems to display.    Discharge Condition: Improved, being discharged to skilled nursing  Diet recommendation: Regular diet  Vitals:   01/29/19 0700 01/29/19 0800  BP:  106/72  Pulse: 60 79  Resp:  18  Temp:  98.7 F (37.1 C)  SpO2: 96% 95%    History of present illness:  68 year old with history of PE on chronic anticoagulation, Alzheimer's dementia and GERD presented to the emergency room from his assisted living facility on 12/16 after having multiple falls.  He previously had been found to be Covid positive on 12/14 but no specific symptoms.  The emergency room, he was found to be hypotensive and hypokalemic and transferred from [facility] to [facility].   Hospital Course:  Principal Problem:   COVID-19 virus infection: Stabilized.  Following admission, he was treated with 5-day course of IV Remdisivir. Active Problems:   Alzheimer's dementia [facility]): Stable, no episodes of acute behavioral disturbance    Overweight (BMI 25.0-29.9) meets criteria with BMI greater than 25.    Transaminitis: Possibly secondary to minimal shock  liver from hypotension versus Covid.  Much improved with minimal elevations by 12/19.    Hypokalemia: Secondary to dehydration.  Resolved with replacement.    History of pulmonary embolus (PE): Continued on anticoagulation. Hypotension: Initially hypertensive which was felt to be secondary to Covid and dehydration, but following treatment with fluids that since resolved.  On 12/22, for mood, patient also started on as needed Haldol.  He was noted over the next few days to have subsequent morning blood pressures with a systolic in the high [id number].  This medication was discontinued.  Patient given a fluid bolus following.  BP has been stable since.  Procedures:  None  Consultations:  None  Discharge Exam: BP 106/72 (BP Location: Right Arm)    Pulse 79    Temp 98.7 F (37.1 C) (Oral)    Resp 18    Ht 5\' 7"  (1.702 m)    Wt 76 kg    SpO2 95%    BMI 26.24 kg/m   General: Alert and oriented x2, no acute distress Cardiovascular: Regular rate and rhythm, S1-S2 Respiratory: Clear to auscultation bilaterally  Discharge Instructions You were cared for by a hospitalist during your hospital stay. If you have any questions about your discharge medications or the care you received while you were in the hospital after you are discharged, you can call the unit and asked to speak with the hospitalist on call if the hospitalist that took care of you is not available. Once you are discharged, your primary care physician will handle any further medical issues. Please note that NO REFILLS for any discharge medications will be authorized once  you are discharged, as it is imperative that you return to your primary care physician (or establish a relationship with a primary care physician if you do not have one) for your aftercare needs so that they can reassess your need for medications and monitor your lab values.  Discharge Instructions    Diet - low sodium heart healthy   Complete by: As directed    Increase  activity slowly   Complete by: As directed      Allergies as of 01/29/2019   No Known Allergies     Medication List    STOP taking these medications   docusate sodium 100 MG capsule Commonly known as: COLACE   doxycycline 100 MG capsule Commonly known as: VIBRAMYCIN   ondansetron 4 MG disintegrating tablet Commonly known as: Zofran ODT   oxyCODONE-acetaminophen 5-325 MG tablet Commonly known as: PERCOCET/ROXICET     TAKE these medications   acetaminophen 500 MG tablet Commonly known as: TYLENOL Take 1 tablet (500 mg total) by mouth every 6 (six) hours as needed for headache.   CeraVe Lotn Apply two times per day as needed for itching and dry skin. What changed:   how to take this  when to take this  additional instructions   donepezil 10 MG tablet Commonly known as: ARICEPT Take 1 tablet (10 mg total) by mouth at bedtime.   DULoxetine 60 MG capsule Commonly known as: CYMBALTA Take 1 capsule (60 mg total) by mouth daily.   feeding supplement (ENSURE ENLIVE) Liqd Take 237 mLs by mouth 2 (two) times daily between meals.   guaiFENesin 600 MG 12 hr tablet Commonly known as: Mucinex Take 1 tablet (600 mg total) by mouth 2 (two) times daily as needed for to loosen phlegm.   Multivitamin Adult Chew Chew 1 tablet by mouth daily.   omeprazole 20 MG capsule Commonly known as: PRILOSEC Take 1 capsule (20 mg total) by mouth daily.   polyethylene glycol 17 g packet Commonly known as: MIRALAX / GLYCOLAX Take 17 g by mouth daily as needed for moderate constipation or severe constipation.   risperiDONE 0.5 MG tablet Commonly known as: RISPERDAL Take 0.5 mg by mouth at bedtime.   risperiDONE 0.25 MG tablet Commonly known as: RISPERDAL Take 1 tablet (0.25 mg total) by mouth every morning.   traMADol 50 MG tablet Commonly known as: ULTRAM Take 1 tablet (50 mg total) by mouth every 6 (six) hours as needed for moderate pain.   Xarelto 20 MG Tabs tablet Generic  drug: rivaroxaban Take 1 tablet (20 mg total) by mouth daily.      No Known Allergies  Contact information for follow-up providers    Axel Filler, MD. Schedule an appointment as soon as possible for a visit in 2 month(s).   Specialty: Internal Medicine Why:  Medication change: Patient previously on as needed OxyContin.  This medication has been discontinued.  New medication: Ultram 50 mg p.o. every 6 hours as needed for pain Contact information: [place] [address] [phone]            Contact information for after-discharge care    Destination    [facility] .   Service: Skilled Nursing Contact information: [place] [phone]                   The results of significant diagnostics from this hospitalization (including imaging, microbiology, ancillary and laboratory) are listed  below for reference.    Significant Diagnostic Studies: DG Chest 2 View  Result Date: 01/17/2019 CLINICAL DATA:  Fever. EXAM: CHEST - 2 VIEW COMPARISON:  Chest radiograph 02/21/2011. FINDINGS: Lower lung volumes from prior exam. Patchy and streaky bibasilar opacities. Heart is normal in size with normal mediastinal contours. No pulmonary edema, pleural effusion, or pneumothorax. Chronic wedging of lower thoracic vertebra. No acute osseous abnormalities are seen. Remote right proximal humerus fracture is partially included. IMPRESSION: Patchy and streaky bibasilar opacities, may be atelectasis or pneumonia in the setting of fever. Electronically Signed   By: Keith Rake M.D.   On: 01/17/2019 00:29   CT Head Wo Contrast  Result Date: 01/17/2019 CLINICAL DATA:  Patient status post fall today.  Initial encounter. EXAM: CT HEAD WITHOUT CONTRAST TECHNIQUE: Contiguous axial images were obtained from the base of the skull through the vertex without intravenous contrast. COMPARISON:  Head CT  01/16/2019. FINDINGS: Brain: No evidence of acute infarction, hemorrhage, hydrocephalus, extra-axial collection or mass lesion/mass effect. Atrophy and chronic microvascular ischemic change noted. Vascular: No hyperdense vessel or unexpected calcification. Skull: Intact.  No focal lesion. Sinuses/Orbits: The right maxillary sinus is completely opacified. Scattered ethmoid air cell disease on the right also noted. Other: None. IMPRESSION: 1. No acute abnormality. 2. Atrophy and chronic microvascular ischemic change. 3. Complete opacification of the right maxillary sinus. Electronically Signed   By: Inge Rise M.D.   On: 01/17/2019 20:07   CT Head Wo Contrast  Result Date: 01/16/2019 CLINICAL DATA:  Headache, fall EXAM: CT HEAD WITHOUT CONTRAST TECHNIQUE: Contiguous axial images were obtained from the base of the skull through the vertex without intravenous contrast. COMPARISON:  04/01/2018 FINDINGS: Brain: There is atrophy and chronic small vessel disease changes. No acute intracranial abnormality. Specifically, no hemorrhage, hydrocephalus, mass lesion, acute infarction, or significant intracranial injury. Vascular: No hyperdense vessel or unexpected calcification. Skull: No acute calvarial abnormality. Sinuses/Orbits: Near complete opacification of the right maxillary sinus. Other: None IMPRESSION: Atrophy, chronic microvascular disease. No acute intracranial abnormality. Right maxillary sinusitis. Electronically Signed   By: Rolm Baptise M.D.   On: 01/16/2019 19:46   CT Cervical Spine Wo Contrast  Result Date: 01/17/2019 CLINICAL DATA:  Fall EXAM: CT CERVICAL SPINE WITHOUT CONTRAST TECHNIQUE: Multidetector CT imaging of the cervical spine was performed without intravenous contrast. Multiplanar CT image reconstructions were also generated. COMPARISON:  None. FINDINGS: Alignment: No subluxation Skull base and vertebrae: No acute fracture. No primary bone lesion or focal pathologic process. Soft  tissues and spinal canal: No prevertebral fluid or swelling. No visible canal hematoma. Disc levels:  Diffuse degenerative disc and facet disease. Upper chest: No acute findings Other: None IMPRESSION: Diffuse cervical degenerative changes.  No acute bony abnormality. Electronically Signed   By: Rolm Baptise M.D.   On: 01/17/2019 19:19    Microbiology: Recent Results (from the past 240 hour(s))  MRSA PCR Screening     Status: None   Collection Time: 01/20/19 10:53 PM   Specimen: Nasopharyngeal  Result Value Ref Range Status   MRSA by PCR NEGATIVE NEGATIVE Final    Comment:        The GeneXpert MRSA Assay (FDA approved for NASAL specimens only), is one component of a comprehensive MRSA colonization surveillance program. It is not intended to diagnose MRSA infection nor to guide or monitor treatment for MRSA infections. Performed at [facility], [place] [address], [place], [place] [address]      Labs: Basic Metabolic Panel: Recent  Labs  Lab 01/25/19 0305  NA 136  K 3.9  CL 104  CO2 25  GLUCOSE 88  BUN 24*  CREATININE 0.94  CALCIUM 8.3*   Liver Function Tests: No results for input(s): AST, ALT, ALKPHOS, BILITOT, PROT, ALBUMIN in the last 168 hours. No results for input(s): LIPASE, AMYLASE in the last 168 hours. No results for input(s): AMMONIA in the last 168 hours. CBC: Recent Labs  Lab 01/29/19 0112  WBC 7.2  HGB 12.1*  HCT 37.5*  MCV 89.5  PLT 366   Cardiac Enzymes: No results for input(s): CKTOTAL, CKMB, CKMBINDEX, TROPONINI in the last 168 hours. BNP: BNP (last 3 results) No results for input(s): BNP in the last 8760 hours.  ProBNP (last 3 results) No results for input(s): PROBNP in the last 8760 hours.  CBG: No results for input(s): GLUCAP in the last 168 hours.     Signed:  Annita Brod, MD [facility] 01/29/2019, 10:32 AM

## 2019-01-29 NOTE — Plan of Care (Signed)
  Problem: Education: Goal: Knowledge of risk factors and measures for prevention of condition will improve Outcome: Progressing   Problem: Education: Goal: Knowledge of risk factors and measures for prevention of condition will improve Outcome: Progressing   Problem: Coping: Goal: Psychosocial and spiritual needs will be supported Outcome: Progressing   Problem: Respiratory: Goal: Will maintain a patent airway Outcome: Progressing Goal: Complications related to the disease process, condition or treatment will be avoided or minimized Outcome: Progressing

## 2019-01-31 DIAGNOSIS — I2782 Chronic pulmonary embolism: Secondary | ICD-10-CM | POA: Diagnosis not present

## 2019-01-31 DIAGNOSIS — E876 Hypokalemia: Secondary | ICD-10-CM | POA: Diagnosis not present

## 2019-01-31 DIAGNOSIS — R7401 Elevation of levels of liver transaminase levels: Secondary | ICD-10-CM | POA: Diagnosis not present

## 2019-01-31 DIAGNOSIS — J320 Chronic maxillary sinusitis: Secondary | ICD-10-CM | POA: Diagnosis not present

## 2019-01-31 DIAGNOSIS — M5090 Cervical disc disorder, unspecified, unspecified cervical region: Secondary | ICD-10-CM | POA: Diagnosis not present

## 2019-01-31 DIAGNOSIS — F0280 Dementia in other diseases classified elsewhere without behavioral disturbance: Secondary | ICD-10-CM | POA: Diagnosis not present

## 2019-01-31 DIAGNOSIS — E860 Dehydration: Secondary | ICD-10-CM | POA: Diagnosis not present

## 2019-01-31 DIAGNOSIS — U071 COVID-19: Secondary | ICD-10-CM | POA: Diagnosis not present

## 2019-01-31 DIAGNOSIS — G308 Other Alzheimer's disease: Secondary | ICD-10-CM | POA: Diagnosis not present

## 2019-02-03 DIAGNOSIS — E876 Hypokalemia: Secondary | ICD-10-CM | POA: Diagnosis not present

## 2019-02-03 DIAGNOSIS — F0280 Dementia in other diseases classified elsewhere without behavioral disturbance: Secondary | ICD-10-CM | POA: Diagnosis not present

## 2019-02-03 DIAGNOSIS — M5090 Cervical disc disorder, unspecified, unspecified cervical region: Secondary | ICD-10-CM | POA: Diagnosis not present

## 2019-02-03 DIAGNOSIS — G308 Other Alzheimer's disease: Secondary | ICD-10-CM | POA: Diagnosis not present

## 2019-02-03 DIAGNOSIS — R2689 Other abnormalities of gait and mobility: Secondary | ICD-10-CM | POA: Diagnosis not present

## 2019-02-03 DIAGNOSIS — I2782 Chronic pulmonary embolism: Secondary | ICD-10-CM | POA: Diagnosis not present

## 2019-02-03 DIAGNOSIS — U071 COVID-19: Secondary | ICD-10-CM | POA: Diagnosis not present

## 2019-02-03 DIAGNOSIS — M6281 Muscle weakness (generalized): Secondary | ICD-10-CM | POA: Diagnosis not present

## 2019-02-03 DIAGNOSIS — R1312 Dysphagia, oropharyngeal phase: Secondary | ICD-10-CM | POA: Diagnosis not present

## 2019-02-03 DIAGNOSIS — R41841 Cognitive communication deficit: Secondary | ICD-10-CM | POA: Diagnosis not present

## 2019-02-03 DIAGNOSIS — R262 Difficulty in walking, not elsewhere classified: Secondary | ICD-10-CM | POA: Diagnosis not present

## 2019-02-03 DIAGNOSIS — I7389 Other specified peripheral vascular diseases: Secondary | ICD-10-CM | POA: Diagnosis not present

## 2019-02-03 DIAGNOSIS — R278 Other lack of coordination: Secondary | ICD-10-CM | POA: Diagnosis not present

## 2019-02-03 DIAGNOSIS — E860 Dehydration: Secondary | ICD-10-CM | POA: Diagnosis not present

## 2019-02-13 DIAGNOSIS — E876 Hypokalemia: Secondary | ICD-10-CM | POA: Diagnosis not present

## 2019-02-13 DIAGNOSIS — E860 Dehydration: Secondary | ICD-10-CM | POA: Diagnosis not present

## 2019-02-13 DIAGNOSIS — U071 COVID-19: Secondary | ICD-10-CM | POA: Diagnosis not present

## 2019-02-13 DIAGNOSIS — F0280 Dementia in other diseases classified elsewhere without behavioral disturbance: Secondary | ICD-10-CM | POA: Diagnosis not present

## 2019-02-13 DIAGNOSIS — G308 Other Alzheimer's disease: Secondary | ICD-10-CM | POA: Diagnosis not present

## 2019-02-13 DIAGNOSIS — R262 Difficulty in walking, not elsewhere classified: Secondary | ICD-10-CM | POA: Diagnosis not present

## 2019-02-15 DIAGNOSIS — F0280 Dementia in other diseases classified elsewhere without behavioral disturbance: Secondary | ICD-10-CM | POA: Diagnosis not present

## 2019-02-15 DIAGNOSIS — R262 Difficulty in walking, not elsewhere classified: Secondary | ICD-10-CM | POA: Diagnosis not present

## 2019-02-15 DIAGNOSIS — G308 Other Alzheimer's disease: Secondary | ICD-10-CM | POA: Diagnosis not present

## 2019-02-15 DIAGNOSIS — I7389 Other specified peripheral vascular diseases: Secondary | ICD-10-CM | POA: Diagnosis not present

## 2019-02-15 DIAGNOSIS — I2782 Chronic pulmonary embolism: Secondary | ICD-10-CM | POA: Diagnosis not present

## 2019-02-15 DIAGNOSIS — E876 Hypokalemia: Secondary | ICD-10-CM | POA: Diagnosis not present

## 2019-02-15 DIAGNOSIS — U071 COVID-19: Secondary | ICD-10-CM | POA: Diagnosis not present

## 2019-02-15 DIAGNOSIS — M5090 Cervical disc disorder, unspecified, unspecified cervical region: Secondary | ICD-10-CM | POA: Diagnosis not present

## 2019-02-17 ENCOUNTER — Telehealth: Payer: Self-pay | Admitting: *Deleted

## 2019-02-17 NOTE — Telephone Encounter (Signed)
Pt's sister called for reassurance, said to tell dr Evette Doffing hello

## 2019-03-13 ENCOUNTER — Telehealth: Payer: Self-pay | Admitting: *Deleted

## 2019-03-13 NOTE — Telephone Encounter (Signed)
Facility calls and states they need a written order for ensure, they just rec'd the ensure from pharmacy she states they had not been aware before today. It was on his disch orders and when ask about this she stated it fell through the cracks. Nurse ask pt's weight as of today, she stated he is weighed the first 5 days of every month and last week his wt was 153.6 lbs He eats appr 50% of each meal, 3 meals daily Drinks water between meals and maybe 2-3 glasses daily Would you like pt to be given ensure as on med list or discontinue? If you want to continue please do a new script. We will fax [phone] the order.

## 2019-03-21 ENCOUNTER — Telehealth: Payer: Self-pay | Admitting: Student in an Organized Health Care Education/Training Program

## 2019-03-21 MED ORDER — ENSURE ENLIVE PO LIQD: 237.0000 mL | Freq: Two times a day (BID) | ORAL | 12 refills | Status: DC

## 2019-03-21 NOTE — Telephone Encounter (Signed)
Ok. I have printed an Rx and will leave with front desk to fax. Thanks!

## 2019-03-21 NOTE — Telephone Encounter (Signed)
t's sister states she was told by melinda at the facility that "they" as in the facility are getting ready to change pt's pcp to their facility md. She stated to sue that she has called the hosp and ask for a resident on call to rtc and never hears from anyone and they can never get anyone when they call the clinic, that dr Evette Doffing and his people are about useless.  Informed sue that the facility must have pt family or POA to make that choice. Will call facility 2/17 and give them direct ph# to triage and ask what they have been calling about will send dr Evette Doffing a more detailed note at that time.

## 2019-03-21 NOTE — Telephone Encounter (Signed)
Pt's Caregiver [name]) requesting a call back.

## 2019-03-22 NOTE — Telephone Encounter (Signed)
Jordan Long called back and stated that she has called the clinic and the main number to hospital and never gets to speak with anyone. Triage ask her specific dates and ph# . I have given her triage direct# and ask her to always call if pt needs anything, I explained that dr Evette Doffing is very prompt at his responses but most times it will not be a same day response due to this being a residency program. Have asked her if it is an emergent situation to always call the triage line. Also gave her triage fax # for future emergent situations

## 2019-03-22 NOTE — Telephone Encounter (Signed)
I have called and left a message for melinda LPN at [facility] for a call back

## 2019-03-22 NOTE — Telephone Encounter (Signed)
Ok. I am sorry to hear about this. I have been signing off on all their faxed orders as quickly as I can. I think giving them a more direct line to the triage phone is a good idea.

## 2019-03-22 NOTE — Telephone Encounter (Signed)
Jordan Long has faxed some forms to be reviewed and signed by dr Evette Doffing they are in [name]

## 2019-03-24 NOTE — Telephone Encounter (Signed)
PT/OT order received via fax and placed in PCP's box for signature. Hubbard Hartshorn, BSN, RN-BC

## 2019-03-24 NOTE — Telephone Encounter (Signed)
Received call from [place] PT from [facility]. States they have not received signed PT/OT treatment plans that were faxed on 03/22/2019. The first 5 pages have been signed by PCP and faxed to [facility] this AM with Confirmation receipt received. Pages 6-9 with med list are illegible and have not been signed. Asked [place] to refax those pages and she states her therapy orders are separate from Nursing orders that [place] sent. She will re-fax her PT/OT orders to [phone] today. Hubbard Hartshorn, BSN, RN-BC

## 2019-03-27 NOTE — Telephone Encounter (Signed)
I signed it this morning 

## 2019-03-30 ENCOUNTER — Other Ambulatory Visit: Payer: Self-pay | Admitting: *Deleted

## 2019-03-30 MED ORDER — RISPERIDONE 0.5 MG PO TABS: ORAL_TABLET | ORAL | 3 refills | Status: DC

## 2019-03-30 NOTE — Telephone Encounter (Signed)
Facility states they are giving pt risperdal 0.5mg  one tablet in am and 2 at night. I do not see these directions but pt needs refill #90 w/ 5 refills Goes to [facility]

## 2019-03-30 NOTE — Telephone Encounter (Signed)
Ok. Done 

## 2019-04-04 ENCOUNTER — Ambulatory Visit: Payer: Medicare HMO | Admitting: Podiatry

## 2019-04-18 ENCOUNTER — Telehealth: Payer: Self-pay | Admitting: Student in an Organized Health Care Education/Training Program

## 2019-04-18 NOTE — Telephone Encounter (Signed)
Pt sister is requesting a callback [phone]
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
## 2019-04-18 NOTE — Telephone Encounter (Signed)
A visit would be a good idea. If no injuries and no symptoms, likely does not need to be urgent. Can you add him on to my continuity clinic, I am on service now, but back in [facility] in early April. Thanks.
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
## 2019-04-19 ENCOUNTER — Telehealth: Payer: Self-pay

## 2019-04-19 NOTE — Telephone Encounter (Signed)
Received TC from Charm Rings with [facility] regarding HH/OT telehealth visit.  Needs to speak to [place]. SChaplin, RN,BSN

## 2019-04-19 NOTE — Telephone Encounter (Signed)
Returned call to [place] with [facility]. PCP placed order for [facility] PT/OT. In order for insurance to cover there must be a F2F visit. A video call has been scheduled for 04/21/2019 at 1015 (telehealth will not suffice). Velia Meyer states patient's sister, [name] will be with him at time of video call. She requests we call [name]'s phone at [phone]. Hubbard Hartshorn, BSN, RN-BC

## 2019-04-21 ENCOUNTER — Other Ambulatory Visit: Payer: Self-pay

## 2019-04-21 ENCOUNTER — Telehealth: Payer: Self-pay | Admitting: *Deleted

## 2019-04-21 ENCOUNTER — Encounter: Payer: Self-pay | Admitting: Internal Medicine

## 2019-04-21 ENCOUNTER — Telehealth (INDEPENDENT_AMBULATORY_CARE_PROVIDER_SITE_OTHER): Payer: Medicare HMO | Admitting: Internal Medicine

## 2019-04-21 DIAGNOSIS — F0280 Dementia in other diseases classified elsewhere without behavioral disturbance: Secondary | ICD-10-CM

## 2019-04-21 DIAGNOSIS — R5381 Other malaise: Secondary | ICD-10-CM | POA: Insufficient documentation

## 2019-04-21 DIAGNOSIS — Z8616 Personal history of COVID-19: Secondary | ICD-10-CM

## 2019-04-21 DIAGNOSIS — G309 Alzheimer's disease, unspecified: Secondary | ICD-10-CM

## 2019-04-21 DIAGNOSIS — R531 Weakness: Secondary | ICD-10-CM

## 2019-04-21 DIAGNOSIS — M170 Bilateral primary osteoarthritis of knee: Secondary | ICD-10-CM

## 2019-04-21 DIAGNOSIS — Z0000 Encounter for general adult medical examination without abnormal findings: Secondary | ICD-10-CM

## 2019-04-21 MED ORDER — MULTIVITAMIN ADULT PO CHEW: 1.0000 | CHEWABLE_TABLET | Freq: Every day | ORAL | 11 refills | Status: DC

## 2019-04-21 NOTE — Telephone Encounter (Signed)
Refill placed for multivitamin to requested pharmacy

## 2019-04-21 NOTE — Telephone Encounter (Signed)
Call placed to [name] (sister) in order to clean up pharmacy list. [facility] uses [facility]. [name] wants Vitamin Rx sent to [facility] on [address]. [name] picks up one of patient's meds at [facility].  All other pharmacies have been removed. Hubbard Hartshorn, BSN, RN-BC

## 2019-04-21 NOTE — Progress Notes (Signed)
   CC: Physical Deconditioning   HPI:  [name] is a 69 y.o. M with PMHx listed below presenting for Physical Deconditioning. Please see the A&P for the status of the patient's chronic medical problems.  Past Medical History:  Diagnosis Date  . DVT (deep venous thrombosis) ([place]) 2010 and 10/2010   after Hernia repair in 2010 s/p IVC filter and coumadin. Next episode after Hernia repair  ( 10/23/2010) . Admitted on 11/02/2010 for repeat DVT. Warfarin was stopped for  5 days before surgery.   Marland Kitchen DVT (deep venous thrombosis) ([place]) 10/2011   right leg after knee surgery  . GERD (gastroesophageal reflux disease)   . Incisional hernia 08/20/2010  . Pulmonary embolism ([place]) 2010   Review of Systems:  Performed and all others negative.  Physical Exam:  There were no vitals filed for this visit. Physical Exam Constitutional:      General: He is not in acute distress.    Appearance: Normal appearance. He is not diaphoretic.  Eyes:     General:        Right eye: No discharge.        Left eye: No discharge.  Pulmonary:     Effort: Pulmonary effort is normal. No respiratory distress.  Musculoskeletal:     Comments: Standing without assistance, muscle visible have good tone  Skin:    Coloration: Skin is not jaundiced or pale.  Neurological:     Mental Status: He is alert. Mental status is at baseline.  Psychiatric:        Mood and Affect: Mood normal.        Behavior: Behavior normal.    Assessment & Plan:   See Encounters Tab for problem based charting.  Patient discussed with Dr. Rebeca Alert

## 2019-04-21 NOTE — Progress Notes (Signed)
Internal Medicine Clinic Attending  Case discussed with Dr. Melvin at the time of the visit.  We reviewed the resident's history and exam and pertinent patient test results.  I agree with the assessment, diagnosis, and plan of care documented in the resident's note.  Kaimana Neuzil, M.D., Ph.D.  
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
## 2019-04-22 DIAGNOSIS — I959 Hypotension, unspecified: Secondary | ICD-10-CM | POA: Diagnosis not present

## 2019-04-22 DIAGNOSIS — G309 Alzheimer's disease, unspecified: Secondary | ICD-10-CM | POA: Diagnosis not present

## 2019-04-22 DIAGNOSIS — M170 Bilateral primary osteoarthritis of knee: Secondary | ICD-10-CM | POA: Diagnosis not present

## 2019-04-22 DIAGNOSIS — F411 Generalized anxiety disorder: Secondary | ICD-10-CM | POA: Diagnosis not present

## 2019-04-22 DIAGNOSIS — E663 Overweight: Secondary | ICD-10-CM | POA: Diagnosis not present

## 2019-04-22 DIAGNOSIS — E785 Hyperlipidemia, unspecified: Secondary | ICD-10-CM | POA: Diagnosis not present

## 2019-04-22 DIAGNOSIS — Z86718 Personal history of other venous thrombosis and embolism: Secondary | ICD-10-CM | POA: Diagnosis not present

## 2019-04-22 DIAGNOSIS — K219 Gastro-esophageal reflux disease without esophagitis: Secondary | ICD-10-CM | POA: Diagnosis not present

## 2019-04-22 DIAGNOSIS — F0280 Dementia in other diseases classified elsewhere without behavioral disturbance: Secondary | ICD-10-CM | POA: Diagnosis not present

## 2019-04-24 NOTE — Telephone Encounter (Signed)
I agree. Thank you.

## 2019-04-24 NOTE — Telephone Encounter (Signed)
[facility] at [facility] PT calls for VO:  1x week for 8 weeks for conditioning, safety. VO given do you agree?

## 2019-04-25 ENCOUNTER — Ambulatory Visit: Payer: Medicare HMO | Admitting: Podiatry

## 2019-04-25 DIAGNOSIS — M170 Bilateral primary osteoarthritis of knee: Secondary | ICD-10-CM | POA: Diagnosis not present

## 2019-04-25 DIAGNOSIS — F411 Generalized anxiety disorder: Secondary | ICD-10-CM | POA: Diagnosis not present

## 2019-04-25 DIAGNOSIS — G309 Alzheimer's disease, unspecified: Secondary | ICD-10-CM | POA: Diagnosis not present

## 2019-04-25 DIAGNOSIS — K219 Gastro-esophageal reflux disease without esophagitis: Secondary | ICD-10-CM | POA: Diagnosis not present

## 2019-04-25 DIAGNOSIS — Z86718 Personal history of other venous thrombosis and embolism: Secondary | ICD-10-CM | POA: Diagnosis not present

## 2019-04-25 DIAGNOSIS — F0280 Dementia in other diseases classified elsewhere without behavioral disturbance: Secondary | ICD-10-CM | POA: Diagnosis not present

## 2019-04-25 DIAGNOSIS — I959 Hypotension, unspecified: Secondary | ICD-10-CM | POA: Diagnosis not present

## 2019-04-25 DIAGNOSIS — E785 Hyperlipidemia, unspecified: Secondary | ICD-10-CM | POA: Diagnosis not present

## 2019-04-25 DIAGNOSIS — E663 Overweight: Secondary | ICD-10-CM | POA: Diagnosis not present

## 2019-04-27 DIAGNOSIS — E663 Overweight: Secondary | ICD-10-CM | POA: Diagnosis not present

## 2019-04-27 DIAGNOSIS — F0280 Dementia in other diseases classified elsewhere without behavioral disturbance: Secondary | ICD-10-CM | POA: Diagnosis not present

## 2019-04-27 DIAGNOSIS — G309 Alzheimer's disease, unspecified: Secondary | ICD-10-CM | POA: Diagnosis not present

## 2019-04-27 DIAGNOSIS — F411 Generalized anxiety disorder: Secondary | ICD-10-CM | POA: Diagnosis not present

## 2019-04-27 DIAGNOSIS — I959 Hypotension, unspecified: Secondary | ICD-10-CM | POA: Diagnosis not present

## 2019-04-27 DIAGNOSIS — K219 Gastro-esophageal reflux disease without esophagitis: Secondary | ICD-10-CM | POA: Diagnosis not present

## 2019-04-27 DIAGNOSIS — M170 Bilateral primary osteoarthritis of knee: Secondary | ICD-10-CM | POA: Diagnosis not present

## 2019-04-27 DIAGNOSIS — Z86718 Personal history of other venous thrombosis and embolism: Secondary | ICD-10-CM | POA: Diagnosis not present

## 2019-04-27 DIAGNOSIS — E785 Hyperlipidemia, unspecified: Secondary | ICD-10-CM | POA: Diagnosis not present

## 2019-04-29 ENCOUNTER — Other Ambulatory Visit: Payer: Self-pay | Admitting: Student in an Organized Health Care Education/Training Program

## 2019-05-01 ENCOUNTER — Telehealth: Payer: Self-pay | Admitting: Student in an Organized Health Care Education/Training Program

## 2019-05-01 ENCOUNTER — Other Ambulatory Visit: Payer: Self-pay | Admitting: Student in an Organized Health Care Education/Training Program

## 2019-05-01 MED ORDER — CETIRIZINE HCL 10 MG PO TABS: 10.0000 mg | ORAL_TABLET | Freq: Every day | ORAL | 2 refills | Status: DC

## 2019-05-01 NOTE — Telephone Encounter (Signed)
Open in Error.

## 2019-05-01 NOTE — Telephone Encounter (Signed)
Refill Request- Please call back to caregiver [name]) back about how pills the pt's ins will pay for.  risperiDONE (RISPERDAL) 0.25 MG tablet   [facility] [place],  - [phone]

## 2019-05-01 NOTE — Telephone Encounter (Signed)
Sure. I ordered cetirizine.

## 2019-05-01 NOTE — Telephone Encounter (Signed)
Written med order sent

## 2019-05-01 NOTE — Telephone Encounter (Signed)
Jordan Long calls to ask instead of mucinex could you prescribe an OTC allergy med in its place, sue states it will be cheaper.

## 2019-05-03 ENCOUNTER — Telehealth: Payer: Self-pay | Admitting: Student in an Organized Health Care Education/Training Program

## 2019-05-03 DIAGNOSIS — F411 Generalized anxiety disorder: Secondary | ICD-10-CM | POA: Diagnosis not present

## 2019-05-03 DIAGNOSIS — F0280 Dementia in other diseases classified elsewhere without behavioral disturbance: Secondary | ICD-10-CM | POA: Diagnosis not present

## 2019-05-03 DIAGNOSIS — Z86718 Personal history of other venous thrombosis and embolism: Secondary | ICD-10-CM | POA: Diagnosis not present

## 2019-05-03 DIAGNOSIS — K219 Gastro-esophageal reflux disease without esophagitis: Secondary | ICD-10-CM | POA: Diagnosis not present

## 2019-05-03 DIAGNOSIS — I959 Hypotension, unspecified: Secondary | ICD-10-CM | POA: Diagnosis not present

## 2019-05-03 DIAGNOSIS — E785 Hyperlipidemia, unspecified: Secondary | ICD-10-CM | POA: Diagnosis not present

## 2019-05-03 DIAGNOSIS — M170 Bilateral primary osteoarthritis of knee: Secondary | ICD-10-CM | POA: Diagnosis not present

## 2019-05-03 DIAGNOSIS — G309 Alzheimer's disease, unspecified: Secondary | ICD-10-CM | POA: Diagnosis not present

## 2019-05-03 DIAGNOSIS — E663 Overweight: Secondary | ICD-10-CM | POA: Diagnosis not present

## 2019-05-03 NOTE — Telephone Encounter (Signed)
RTC to EC/sister, [name].  She states [facility] has not received the faxed order she requested that the Mucinex be stopped and pt be given cetrizine in its place.  [name] states she has picked up RX for [place], but had talked to [place] and was told above orders would be faxed to [facility] as well. Also states [facility] has lost pt's medicare card and is requesting a copy.  [name] is requesting we fax a copy of pt's medicare card to [facility] as she does not have this.  [name] also states she can pick this up if need be. SChaplin, RN,BSN

## 2019-05-03 NOTE — Telephone Encounter (Signed)
Pls contact pt sister [phone] 

## 2019-05-08 DIAGNOSIS — E663 Overweight: Secondary | ICD-10-CM | POA: Diagnosis not present

## 2019-05-08 DIAGNOSIS — Z86718 Personal history of other venous thrombosis and embolism: Secondary | ICD-10-CM | POA: Diagnosis not present

## 2019-05-08 DIAGNOSIS — I959 Hypotension, unspecified: Secondary | ICD-10-CM | POA: Diagnosis not present

## 2019-05-08 DIAGNOSIS — F0280 Dementia in other diseases classified elsewhere without behavioral disturbance: Secondary | ICD-10-CM | POA: Diagnosis not present

## 2019-05-08 DIAGNOSIS — M170 Bilateral primary osteoarthritis of knee: Secondary | ICD-10-CM | POA: Diagnosis not present

## 2019-05-08 DIAGNOSIS — E785 Hyperlipidemia, unspecified: Secondary | ICD-10-CM | POA: Diagnosis not present

## 2019-05-08 DIAGNOSIS — K219 Gastro-esophageal reflux disease without esophagitis: Secondary | ICD-10-CM | POA: Diagnosis not present

## 2019-05-08 DIAGNOSIS — G309 Alzheimer's disease, unspecified: Secondary | ICD-10-CM | POA: Diagnosis not present

## 2019-05-08 DIAGNOSIS — F411 Generalized anxiety disorder: Secondary | ICD-10-CM | POA: Diagnosis not present

## 2019-05-18 DIAGNOSIS — I959 Hypotension, unspecified: Secondary | ICD-10-CM | POA: Diagnosis not present

## 2019-05-18 DIAGNOSIS — Z86718 Personal history of other venous thrombosis and embolism: Secondary | ICD-10-CM | POA: Diagnosis not present

## 2019-05-18 DIAGNOSIS — E663 Overweight: Secondary | ICD-10-CM | POA: Diagnosis not present

## 2019-05-18 DIAGNOSIS — E785 Hyperlipidemia, unspecified: Secondary | ICD-10-CM | POA: Diagnosis not present

## 2019-05-18 DIAGNOSIS — G309 Alzheimer's disease, unspecified: Secondary | ICD-10-CM | POA: Diagnosis not present

## 2019-05-18 DIAGNOSIS — F0280 Dementia in other diseases classified elsewhere without behavioral disturbance: Secondary | ICD-10-CM | POA: Diagnosis not present

## 2019-05-18 DIAGNOSIS — F411 Generalized anxiety disorder: Secondary | ICD-10-CM | POA: Diagnosis not present

## 2019-05-18 DIAGNOSIS — M170 Bilateral primary osteoarthritis of knee: Secondary | ICD-10-CM | POA: Diagnosis not present

## 2019-05-18 DIAGNOSIS — K219 Gastro-esophageal reflux disease without esophagitis: Secondary | ICD-10-CM | POA: Diagnosis not present

## 2019-05-22 DIAGNOSIS — Z86718 Personal history of other venous thrombosis and embolism: Secondary | ICD-10-CM | POA: Diagnosis not present

## 2019-05-22 DIAGNOSIS — M170 Bilateral primary osteoarthritis of knee: Secondary | ICD-10-CM | POA: Diagnosis not present

## 2019-05-22 DIAGNOSIS — E785 Hyperlipidemia, unspecified: Secondary | ICD-10-CM | POA: Diagnosis not present

## 2019-05-22 DIAGNOSIS — F0280 Dementia in other diseases classified elsewhere without behavioral disturbance: Secondary | ICD-10-CM | POA: Diagnosis not present

## 2019-05-22 DIAGNOSIS — K219 Gastro-esophageal reflux disease without esophagitis: Secondary | ICD-10-CM | POA: Diagnosis not present

## 2019-05-22 DIAGNOSIS — I959 Hypotension, unspecified: Secondary | ICD-10-CM | POA: Diagnosis not present

## 2019-05-22 DIAGNOSIS — F411 Generalized anxiety disorder: Secondary | ICD-10-CM | POA: Diagnosis not present

## 2019-05-22 DIAGNOSIS — E663 Overweight: Secondary | ICD-10-CM | POA: Diagnosis not present

## 2019-05-22 DIAGNOSIS — G309 Alzheimer's disease, unspecified: Secondary | ICD-10-CM | POA: Diagnosis not present

## 2019-05-23 ENCOUNTER — Telehealth: Payer: Self-pay

## 2019-05-23 NOTE — Telephone Encounter (Signed)
Requesting to speak with a nurse about meds, please call pt back.  

## 2019-05-23 NOTE — Telephone Encounter (Signed)
[name] calls and states the PT person is having a hard time waking pt in the mornings. They wondered if you could cut the risperdal at night to 1 tab instead of 2. And then make 1 prn for restlessness, so 1 in am, 1 in pm and 1 prn daily?  [name] also states the daughter in law has ordered pt some compression type socks. Please advise

## 2019-05-24 MED ORDER — RISPERIDONE 1 MG PO TABS: 1.0000 mg | ORAL_TABLET | Freq: Two times a day (BID) | ORAL | 3 refills | Status: DC

## 2019-05-24 MED ORDER — RISPERIDONE 0.5 MG PO TABS: 0.5000 mg | ORAL_TABLET | Freq: Two times a day (BID) | ORAL | Status: DC

## 2019-05-24 NOTE — Telephone Encounter (Signed)
To make things more consistent, I would change risperidone to 0.5mg  twice daily and see if that improves the morning drowsiness. Can you call [facility] with that verbal order? I updated our med list.

## 2019-05-25 ENCOUNTER — Telehealth: Payer: Self-pay | Admitting: Student in an Organized Health Care Education/Training Program

## 2019-05-25 NOTE — Telephone Encounter (Signed)
Pls contact sister [phone]; wants pt to have medicine to go to sleep

## 2019-05-26 NOTE — Telephone Encounter (Signed)
Pt sister is calling back regarding pt, regarding his walking since COVID [phone]

## 2019-05-29 MED ORDER — RISPERIDONE 0.5 MG PO TABS: 0.5000 mg | ORAL_TABLET | Freq: Two times a day (BID) | ORAL | 1 refills | Status: DC

## 2019-05-29 NOTE — Telephone Encounter (Signed)
I spoke with Jordan Long, his sister. Sounds like his dementia is progressing. He is struggling to walk at this point, struggles to use the walker reliably. Having sleep and wake cycle disturbances. I explained the small change in his risperidone recently. We talked about the risks of sleep medicines, I recommended against any hypnotic for risk of falls and delirium. I suggested outpatient hospice or palliative care may be helpful at this point in managing his symptoms. She is going to discuss that with the rest of the family and let me know. We are also working to arrange for either him to come to [facility] for a visit, or if I can make a home visit to him.

## 2019-05-30 ENCOUNTER — Telehealth: Payer: Self-pay | Admitting: Student in an Organized Health Care Education/Training Program

## 2019-05-30 NOTE — Telephone Encounter (Signed)
PLS CONTACT PT SISTER [phone]

## 2019-05-30 NOTE — Telephone Encounter (Signed)
Return call to pt's sister, [name] - stated she talked to Dr Evette Doffing yesterday. Stated she called [facility] and they told her it will be ok for Dr Evette Doffing to call and schedule a home visit to see the pt.

## 2019-05-31 NOTE — Telephone Encounter (Signed)
I spoke with [facility] today. I am going to do a home visit for [facility] tomorrow at [place]? Can someone add him to the clinic schedule for 4/29 so I can do this as an encounter please?

## 2019-06-01 ENCOUNTER — Ambulatory Visit: Payer: Medicare HMO | Admitting: Student in an Organized Health Care Education/Training Program

## 2019-06-01 ENCOUNTER — Encounter: Payer: Self-pay | Admitting: Student in an Organized Health Care Education/Training Program

## 2019-06-01 ENCOUNTER — Telehealth: Payer: Self-pay | Admitting: *Deleted

## 2019-06-01 ENCOUNTER — Other Ambulatory Visit: Payer: Self-pay

## 2019-06-01 VITALS — BP 94/64 | HR 70

## 2019-06-01 DIAGNOSIS — I959 Hypotension, unspecified: Secondary | ICD-10-CM | POA: Diagnosis not present

## 2019-06-01 DIAGNOSIS — G309 Alzheimer's disease, unspecified: Secondary | ICD-10-CM

## 2019-06-01 DIAGNOSIS — M170 Bilateral primary osteoarthritis of knee: Secondary | ICD-10-CM | POA: Diagnosis not present

## 2019-06-01 DIAGNOSIS — F0280 Dementia in other diseases classified elsewhere without behavioral disturbance: Secondary | ICD-10-CM

## 2019-06-01 DIAGNOSIS — E785 Hyperlipidemia, unspecified: Secondary | ICD-10-CM | POA: Diagnosis not present

## 2019-06-01 DIAGNOSIS — E663 Overweight: Secondary | ICD-10-CM | POA: Diagnosis not present

## 2019-06-01 DIAGNOSIS — I872 Venous insufficiency (chronic) (peripheral): Secondary | ICD-10-CM

## 2019-06-01 DIAGNOSIS — D6859 Other primary thrombophilia: Secondary | ICD-10-CM

## 2019-06-01 DIAGNOSIS — K219 Gastro-esophageal reflux disease without esophagitis: Secondary | ICD-10-CM | POA: Diagnosis not present

## 2019-06-01 DIAGNOSIS — F411 Generalized anxiety disorder: Secondary | ICD-10-CM | POA: Diagnosis not present

## 2019-06-01 DIAGNOSIS — Z86718 Personal history of other venous thrombosis and embolism: Secondary | ICD-10-CM | POA: Diagnosis not present

## 2019-06-01 MED ORDER — RISPERIDONE 0.5 MG PO TABS: 0.5000 mg | ORAL_TABLET | Freq: Every day | ORAL | 1 refills | Status: DC

## 2019-06-01 MED ORDER — RISPERIDONE 0.25 MG PO TABS: 0.2500 mg | ORAL_TABLET | Freq: Every morning | ORAL | 2 refills | Status: DC

## 2019-06-01 NOTE — Assessment & Plan Note (Signed)
1+ lower extremity edema bilaterally today but no skin changes.  Likely multifactorial, significant component of immobility.  Plan is to do compression stockings during the day, remove in the evening.

## 2019-06-01 NOTE — Progress Notes (Signed)
    Assessment and Plan:  See Encounters tab for problem-based medical decision making.   __________________________________________________________  HPI:   This was a home visit for Jordan Long at the request of his sister for follow-up of Alzheimer's dementia.  The patient is a 69 year old person living with advanced dementia, hypercoagulable state on anticoagulation,  Covid infection 5 months ago, currently residing in a memory care unit at [facility] on [address].  I visited with him this morning he was working with the physical therapist, walking around the halls and seems to be doing pretty well.  He has a very slow antalgic gait at this point, he was using a rolling walker with the therapist which was helpful.  He denies any pain in his right knee, as usual he has no complaints today.  He says very little, he does not remember having any problems recently that he wants to address.  Nursing notes that he has been doing pretty well, intermittently active during the day, sometimes sleeps for excessive amounts of time.  He feeds himself and is pretty functional with other basic activities of daily living.  He had several falls in March, but no injuries.  He had 1 behavioral outburst last week but was able to be reoriented by the staff.  Family noted some morning time drowsiness which seemed excessive about a month ago and we decreased his evening time dose of risperidone which seems to have helped.  __________________________________________________________  Problem List: Patient Active Problem List   Diagnosis Date Noted  . Hypercoagulable state ([place]) 01/20/2016    Priority: High  . Alzheimer's dementia ([place]) 12/25/2013    Priority: High  . Physical deconditioning 04/21/2019    Priority: Medium  . Basal cell carcinoma (BCC) 12/27/2017    Priority: Medium  . Osteoarthritis of knees, bilateral 07/13/2011    Priority: Medium  . Generalized anxiety disorder  03/02/2011    Priority: Medium  . Chronic venous insufficiency 06/04/2015    Priority: Low  . Dyslipidemia 06/13/2013    Priority: Low  . Preventative health care 12/09/2012    Priority: Low  . GERD (gastroesophageal reflux disease) 07/13/2011    Priority: Low    Medications: Reconciled today in Epic __________________________________________________________  Physical Exam:  Vital Signs: Vitals:   06/01/19 1055  BP: 94/64  Pulse: 70    Gen: Chronically ill-appearing, pleasant and in no distress Neck: No cervical LAD, No thyromegaly or nodules CV: RRR, no murmurs Pulm: Normal effort, CTA throughout, no wheezing Ext: Warm, 1+ bilateral pitting edema, he has a valgus deformity of the right knee on standing, crepitus at the right knee with passive range of motion, no effusion no pain. Skin: No atypical appearing moles. No rashes

## 2019-06-01 NOTE — Assessment & Plan Note (Signed)
Stable, no recent thromboembolic events even after Covid infection in December.  Renal function is stable.  Plan to continue with Xarelto.

## 2019-06-01 NOTE — Telephone Encounter (Signed)
Theophilus Bones, RN; Skeet Latch; Sheila Oats, Delaney Meigs, Melissa   Cloretta Ned!   Yes that is a retail item that we can provide. Will process now for the patient.   Thanks!       Previous Messages   ----- Message -----  From: Velora Heckler, RN  Sent: 06/01/2019 12:11 PM EDT  To: Darlina Guys, Roland Earl  Subject: DME                        Hello!   An order for a rolling walker and compression stockings has been placed. Are you able to provide compression stockings to patient?   Thanks!   Ander Purpura

## 2019-06-01 NOTE — Assessment & Plan Note (Signed)
Alzheimer's dementia is advanced at this point.  He has low functional status, but seems to be safe in current living environment.  Requiring assisted living facility, dementia care unit.  We will continue with donepezil.  Otherwise his medication list is reasonable.  Discontinue cetirizine as the risk of the antihistamine outweighs the benefit in this case.  He has occasional behavioral disturbances which put him and the facility staff at risk, so we will continue with low-dose risperidone 0.25 mg in the morning and 0.5 mg in the evening.  We decreased this evening dose recently because of some morning drowsiness.  I think his family has reasonable expectations.  We are considering home hospice care in the near future as this disease progresses.

## 2019-06-01 NOTE — Assessment & Plan Note (Signed)
Osteoarthritis in the right knee seems to be progressing and causing an antalgic and unstable gait.  He had several falls in March, none so far this month.  Given his advanced dementia he is not a candidate for knee replacement surgery.  I will order a rolling walker to help with his mobility and reduce risk of falls.

## 2019-06-08 DIAGNOSIS — E785 Hyperlipidemia, unspecified: Secondary | ICD-10-CM | POA: Diagnosis not present

## 2019-06-08 DIAGNOSIS — F411 Generalized anxiety disorder: Secondary | ICD-10-CM | POA: Diagnosis not present

## 2019-06-08 DIAGNOSIS — F0280 Dementia in other diseases classified elsewhere without behavioral disturbance: Secondary | ICD-10-CM | POA: Diagnosis not present

## 2019-06-08 DIAGNOSIS — E663 Overweight: Secondary | ICD-10-CM | POA: Diagnosis not present

## 2019-06-08 DIAGNOSIS — M170 Bilateral primary osteoarthritis of knee: Secondary | ICD-10-CM | POA: Diagnosis not present

## 2019-06-08 DIAGNOSIS — K219 Gastro-esophageal reflux disease without esophagitis: Secondary | ICD-10-CM | POA: Diagnosis not present

## 2019-06-08 DIAGNOSIS — I959 Hypotension, unspecified: Secondary | ICD-10-CM | POA: Diagnosis not present

## 2019-06-08 DIAGNOSIS — Z86718 Personal history of other venous thrombosis and embolism: Secondary | ICD-10-CM | POA: Diagnosis not present

## 2019-06-08 DIAGNOSIS — G309 Alzheimer's disease, unspecified: Secondary | ICD-10-CM | POA: Diagnosis not present

## 2019-06-12 ENCOUNTER — Telehealth: Payer: Self-pay | Admitting: *Deleted

## 2019-06-12 NOTE — Telephone Encounter (Signed)
Agree  Thank you

## 2019-06-12 NOTE — Telephone Encounter (Signed)
Call from [place], [facility] - requesting verbal order for "PT 2 times a week x 1 week; then once a week x 1 week". VO given - if not appropriate, let me know.'Thanks

## 2019-06-13 ENCOUNTER — Telehealth: Payer: Self-pay | Admitting: Student in an Organized Health Care Education/Training Program

## 2019-06-13 NOTE — Telephone Encounter (Signed)
Pt added to [facility] schedule tomorrow for home visit. Facility and [employer].  [name] states she will not be present for visit as she is feeling overwhelmed. Thank you, Jarelle Ates

## 2019-06-13 NOTE — Telephone Encounter (Signed)
RTC to [name], she states:  "Sunday morning [name] was very tired at breakfast, slumped over his breakfast and couldn't move.  [facility] called EMS, but when EMS arrived Glen's b/p and blood sugar was good.  They put him back to bed and he slept.  I was there later in the morning and he seemed better.  Yesterday, he was worse again.  He didn't talk to me and seemed to be out of it.  His feet are very swollen and his left eye is drooping more than usual.  Last night, the staff called me and found him on the floor.  EMS was not called because the staff stated he did not have a head injury.  Would you please call [facility] and talk to a nurse to check on him"?  TC placed to [facility], RN asked to speak with Mr. Ciccarelli's caregiver.  RN placed on hold, then was told the caregiver was with patients and caregiver will call triage back. SChaplin, RN,BSN

## 2019-06-13 NOTE — Telephone Encounter (Signed)
2nd TC to [facility], spoke with patient's caregiver today, Lorenso Courier (med tech).  She states patient is: "not really eating much anymore, has been wondering off a lot today and she has found him in other rooms 3-4 times today alone".  He has been taking his clothes off today and urinating on the floor.  When he is walking, he seems to be leaning forward more.  He looks zoned out.  He is not able to hold a conversation with me anymore, he looks a lot worse to me".  RN asked for recent b/p's, and was told by antoinette, med tech,  that b/p's are only taken once a month.  If MD wants additional b/p's, MD will have to order this.  Will forward to PCP.  Thank you, SChaplin, RN,BSN

## 2019-06-13 NOTE — Telephone Encounter (Signed)
Pls contact sister regarding brother [phone]

## 2019-06-13 NOTE — Telephone Encounter (Signed)
Ok. Can you let [name] and the facility know that I will come do a home visit tomorrow morning around 9. Can you add him to the [facility] schedule tomorrow so I can have the encounter to use please?

## 2019-06-13 NOTE — Telephone Encounter (Signed)
Thanks for following up with them. Let me know what they say. Try to get a recent blood pressure if you can please. If it sounds like he is not doing well, I can do a home visit with him tomorrow morning.

## 2019-06-14 ENCOUNTER — Encounter: Payer: Self-pay | Admitting: Student in an Organized Health Care Education/Training Program

## 2019-06-14 ENCOUNTER — Ambulatory Visit: Payer: Medicare HMO | Admitting: Student in an Organized Health Care Education/Training Program

## 2019-06-14 VITALS — BP 98/60 | HR 70

## 2019-06-14 DIAGNOSIS — G309 Alzheimer's disease, unspecified: Secondary | ICD-10-CM

## 2019-06-14 DIAGNOSIS — F0280 Dementia in other diseases classified elsewhere without behavioral disturbance: Secondary | ICD-10-CM | POA: Diagnosis not present

## 2019-06-14 NOTE — Assessment & Plan Note (Signed)
Progressive Alzheimer's dementia with declining functional status.  On exam today no signs or symptoms to suggest an acute illness driving this change.  I drew labs today to check a CMP and CBC to rule out metabolic derangement or anemia.  Plan to discontinue risperidone, he has not had any recent agitation or behavior disturbances.  We recently decreased the dose; at this point even on low-dose it could be adding too much sedation without any benefit.  I updated his family, they have reasonable expectations.  They are considering home hospice which I have offered, but currently seems comfortable.

## 2019-06-14 NOTE — Progress Notes (Signed)
   Assessment and Plan:  See Encounters tab for problem-based medical decision making.   __________________________________________________________  HPI:   This is a home visit for a 69 year old person living with dementia at the request of his sister for an acute complaint of worsening functional status. [name] called our office yesterday reporting that the patient has been zoned out recently, not talking or interacting with the staff, and seemingly more confused. On Sunday he fell asleep at the breakfast table, slumped over into his food.  EMS was called, blood pressure, other vitals, fingerstick glucose were normal at the time and he was not transported to the hospital.  He had an episode yesterday where he wandered into another resident's room at his assisted living facility.  Also incontinent of urine on the floor, staff thinks that he was confused.  No agitation, no other behavior disturbance.  Still feeding himself, walking a little better using a rolling walker.  As usual he has no complaints today.  Denies pain, shortness of breath, cough, nausea, or fevers.  Only recent changes in the medications were decreased in risperidone dosing due to somnolence during the day and I discontinued cetirizine to avoid anticholinergic side effects.  __________________________________________________________  Problem List: Patient Active Problem List   Diagnosis Date Noted  . Hypercoagulable state ([place]) 01/20/2016    Priority: High  . Alzheimer's dementia ([place]) 12/25/2013    Priority: High  . Physical deconditioning 04/21/2019    Priority: Medium  . Basal cell carcinoma (BCC) 12/27/2017    Priority: Medium  . Osteoarthritis of knees, bilateral 07/13/2011    Priority: Medium  . Generalized anxiety disorder 03/02/2011    Priority: Medium  . Chronic venous insufficiency 06/04/2015    Priority: Low  . Dyslipidemia 06/13/2013    Priority: Low  . Preventative health care 12/09/2012    Priority: Low   . GERD (gastroesophageal reflux disease) 07/13/2011    Priority: Low    Medications: Reconciled today in Epic __________________________________________________________  Physical Exam:  Vital Signs: Vitals:   06/14/19 1008  BP: 98/60  Pulse: 70    Gen: Well appearing, NAD CV: RRR, no murmurs Pulm: Normal effort, CTA throughout, no wheezing Abd: Soft, NT, ND Ext: Warm, distal 1+ pitting edema around the ankles, no edema at the shins.  Mild chronic stasis changes at the right leg, but no acute stasis dermatitis on either legs.

## 2019-06-15 LAB — CMP14 + ANION GAP
ALT: 16 IU/L (ref 0–44)
AST: 28 IU/L (ref 0–40)
Albumin/Globulin Ratio: 1.4 (ref 1.2–2.2)
Albumin: 3.9 g/dL (ref 3.8–4.8)
Alkaline Phosphatase: 122 IU/L — ABNORMAL HIGH (ref 39–117)
Anion Gap: 15 mmol/L (ref 10.0–18.0)
BUN/Creatinine Ratio: 18 (ref 10–24)
BUN: 19 mg/dL (ref 8–27)
Bilirubin Total: 0.3 mg/dL (ref 0.0–1.2)
CO2: 23 mmol/L (ref 20–29)
Calcium: 9.1 mg/dL (ref 8.6–10.2)
Chloride: 102 mmol/L (ref 96–106)
Creatinine, Ser: 1.05 mg/dL (ref 0.76–1.27)
GFR calc Af Amer: 83 mL/min/{1.73_m2} (ref >59)
GFR calc non Af Amer: 72 mL/min/{1.73_m2} (ref >59)
Globulin, Total: 2.8 g/dL (ref 1.5–4.5)
Glucose: 99 mg/dL (ref 65–99)
Potassium: 3.9 mmol/L (ref 3.5–5.2)
Sodium: 140 mmol/L (ref 134–144)
Total Protein: 6.7 g/dL (ref 6.0–8.5)

## 2019-06-15 LAB — CBC
Hematocrit: 36.5 % — ABNORMAL LOW (ref 37.5–51.0)
Hemoglobin: 11.9 g/dL — ABNORMAL LOW (ref 13.0–17.7)
MCH: 28.6 pg (ref 26.6–33.0)
MCHC: 32.6 g/dL (ref 31.5–35.7)
MCV: 88 fL (ref 79–97)
Platelets: 277 10*3/uL (ref 150–450)
RBC: 4.16 x10E6/uL (ref 4.14–5.80)
RDW: 12.9 % (ref 11.6–15.4)
WBC: 7.3 10*3/uL (ref 3.4–10.8)

## 2019-06-18 ENCOUNTER — Emergency Department (HOSPITAL_COMMUNITY): Payer: Medicare HMO

## 2019-06-18 ENCOUNTER — Telehealth: Payer: Self-pay | Admitting: Internal Medicine

## 2019-06-18 ENCOUNTER — Other Ambulatory Visit: Payer: Self-pay

## 2019-06-18 ENCOUNTER — Encounter (HOSPITAL_COMMUNITY): Payer: Self-pay | Admitting: Emergency Medicine

## 2019-06-18 ENCOUNTER — Emergency Department (HOSPITAL_COMMUNITY)
Admission: EM | Admit: 2019-06-18 | Discharge: 2019-06-18 | Disposition: A | Discharge status: Home / Self Care | Payer: Medicare HMO | Attending: Emergency Medicine | Admitting: Emergency Medicine

## 2019-06-18 DIAGNOSIS — R609 Edema, unspecified: Secondary | ICD-10-CM | POA: Diagnosis not present

## 2019-06-18 DIAGNOSIS — M25551 Pain in right hip: Secondary | ICD-10-CM | POA: Diagnosis not present

## 2019-06-18 DIAGNOSIS — Y9389 Activity, other specified: Secondary | ICD-10-CM | POA: Diagnosis not present

## 2019-06-18 DIAGNOSIS — Z7901 Long term (current) use of anticoagulants: Secondary | ICD-10-CM | POA: Insufficient documentation

## 2019-06-18 DIAGNOSIS — R001 Bradycardia, unspecified: Secondary | ICD-10-CM | POA: Diagnosis not present

## 2019-06-18 DIAGNOSIS — R4182 Altered mental status, unspecified: Secondary | ICD-10-CM | POA: Insufficient documentation

## 2019-06-18 DIAGNOSIS — G309 Alzheimer's disease, unspecified: Secondary | ICD-10-CM | POA: Diagnosis not present

## 2019-06-18 DIAGNOSIS — Z79899 Other long term (current) drug therapy: Secondary | ICD-10-CM | POA: Diagnosis not present

## 2019-06-18 DIAGNOSIS — W010XXA Fall on same level from slipping, tripping and stumbling without subsequent striking against object, initial encounter: Secondary | ICD-10-CM | POA: Diagnosis not present

## 2019-06-18 DIAGNOSIS — S0990XA Unspecified injury of head, initial encounter: Secondary | ICD-10-CM | POA: Diagnosis not present

## 2019-06-18 DIAGNOSIS — Z7401 Bed confinement status: Secondary | ICD-10-CM | POA: Diagnosis not present

## 2019-06-18 DIAGNOSIS — Y92129 Unspecified place in nursing home as the place of occurrence of the external cause: Secondary | ICD-10-CM | POA: Insufficient documentation

## 2019-06-18 DIAGNOSIS — M25552 Pain in left hip: Secondary | ICD-10-CM | POA: Diagnosis not present

## 2019-06-18 DIAGNOSIS — R531 Weakness: Secondary | ICD-10-CM | POA: Diagnosis not present

## 2019-06-18 DIAGNOSIS — Y999 Unspecified external cause status: Secondary | ICD-10-CM | POA: Diagnosis not present

## 2019-06-18 DIAGNOSIS — I4891 Unspecified atrial fibrillation: Secondary | ICD-10-CM | POA: Diagnosis not present

## 2019-06-18 DIAGNOSIS — M2550 Pain in unspecified joint: Secondary | ICD-10-CM | POA: Diagnosis not present

## 2019-06-18 DIAGNOSIS — R404 Transient alteration of awareness: Secondary | ICD-10-CM | POA: Diagnosis not present

## 2019-06-18 DIAGNOSIS — Z86718 Personal history of other venous thrombosis and embolism: Secondary | ICD-10-CM | POA: Diagnosis not present

## 2019-06-18 DIAGNOSIS — I959 Hypotension, unspecified: Secondary | ICD-10-CM | POA: Diagnosis not present

## 2019-06-18 DIAGNOSIS — W19XXXA Unspecified fall, initial encounter: Secondary | ICD-10-CM

## 2019-06-18 DIAGNOSIS — R9431 Abnormal electrocardiogram [ECG] [EKG]: Secondary | ICD-10-CM | POA: Diagnosis not present

## 2019-06-18 DIAGNOSIS — M6281 Muscle weakness (generalized): Secondary | ICD-10-CM | POA: Diagnosis not present

## 2019-06-18 DIAGNOSIS — S79911A Unspecified injury of right hip, initial encounter: Secondary | ICD-10-CM | POA: Diagnosis not present

## 2019-06-18 DIAGNOSIS — R52 Pain, unspecified: Secondary | ICD-10-CM | POA: Diagnosis not present

## 2019-06-18 DIAGNOSIS — S79912A Unspecified injury of left hip, initial encounter: Secondary | ICD-10-CM | POA: Diagnosis not present

## 2019-06-18 DIAGNOSIS — S299XXA Unspecified injury of thorax, initial encounter: Secondary | ICD-10-CM | POA: Diagnosis not present

## 2019-06-18 LAB — CBC
HCT: 38.3 % — ABNORMAL LOW (ref 39.0–52.0)
Hemoglobin: 11.9 g/dL — ABNORMAL LOW (ref 13.0–17.0)
MCH: 28.2 pg (ref 26.0–34.0)
MCHC: 31.1 g/dL (ref 30.0–36.0)
MCV: 90.8 fL (ref 80.0–100.0)
Platelets: 322 10*3/uL (ref 150–400)
RBC: 4.22 MIL/uL (ref 4.22–5.81)
RDW: 13.6 % (ref 11.5–15.5)
WBC: 6.6 10*3/uL (ref 4.0–10.5)
nRBC: 0 % (ref 0.0–0.2)

## 2019-06-18 LAB — COMPREHENSIVE METABOLIC PANEL
ALT: 17 U/L (ref 0–44)
AST: 24 U/L (ref 15–41)
Albumin: 3 g/dL — ABNORMAL LOW (ref 3.5–5.0)
Alkaline Phosphatase: 94 U/L (ref 38–126)
Anion gap: 10 (ref 5–15)
BUN: 16 mg/dL (ref 8–23)
CO2: 26 mmol/L (ref 22–32)
Calcium: 8.8 mg/dL — ABNORMAL LOW (ref 8.9–10.3)
Chloride: 102 mmol/L (ref 98–111)
Creatinine, Ser: 1.03 mg/dL (ref 0.61–1.24)
GFR calc Af Amer: >60 mL/min (ref >60)
GFR calc non Af Amer: >60 mL/min (ref >60)
Glucose, Bld: 104 mg/dL — ABNORMAL HIGH (ref 70–99)
Potassium: 3.9 mmol/L (ref 3.5–5.1)
Sodium: 138 mmol/L (ref 135–145)
Total Bilirubin: 0.5 mg/dL (ref 0.3–1.2)
Total Protein: 6.9 g/dL (ref 6.5–8.1)

## 2019-06-18 LAB — URINALYSIS, ROUTINE W REFLEX MICROSCOPIC
Bilirubin Urine: NEGATIVE
Glucose, UA: NEGATIVE mg/dL
Hgb urine dipstick: NEGATIVE
Ketones, ur: NEGATIVE mg/dL
Leukocytes,Ua: NEGATIVE
Nitrite: NEGATIVE
Protein, ur: NEGATIVE mg/dL
Specific Gravity, Urine: 1.014 (ref 1.005–1.030)
pH: 6 (ref 5.0–8.0)

## 2019-06-18 LAB — CBG MONITORING, ED: Glucose-Capillary: 174 mg/dL — ABNORMAL HIGH (ref 70–99)

## 2019-06-18 MED ORDER — ACETAMINOPHEN 325 MG PO TABS: 650.0000 mg | ORAL_TABLET | Freq: Four times a day (QID) | ORAL | 2 refills | Status: DC | PRN

## 2019-06-18 NOTE — Telephone Encounter (Signed)
   Reason for call:   I received a call from Mr. [name] [name]. at 11:30 AM. Returned call with no answer. Appears that patient is in ED now. Will continue to monitor.     Asencion Noble, MD   06/18/2019, 11:48 AM

## 2019-06-18 NOTE — ED Triage Notes (Signed)
EMS stated, he comes from [facility] with confusion, not eating or drinking and complaining of left femur area is in pain. He had a mechanical fall 2 days ago.

## 2019-06-18 NOTE — Discharge Instructions (Signed)
Instructions  Jordan Long had a workup in the ER including urine test, blood tests, CT of the Head, and xrays of his hips.  His xrays did not show fractures, but did show arthritis of the hips.  He had no pain with range of motion at the hips or knees.  He had no spinal tenderness in his lower back.  I do not believe he has a broken bone at this time.  I would recommend a PT evaluation and monitored transfers from wheelchair to bed for the next few days until you can ensure that he is stable walking on his own.  His CT scan shows no brain bleeding from his fall.  His labs were unremarkable.  His urine shows no sign of infection.  I spoke to his sister [name] at the bedside and updated her.  I assured her that he could be given tylenol 650 mg every 6 hours for pain for the next 7 days, then as needed going forward.  I provided a script to these forms.

## 2019-06-18 NOTE — ED Provider Notes (Signed)
[facility] EMERGENCY DEPARTMENT Provider Note   CSN: [id number] Arrival date & time: 06/18/19  1110     History Chief Complaint  Patient presents with  . Altered Mental Status  . Extremity Weakness  . Anorexia    Jordan Hullender Maneesh Licata. is a 69 y.o. male with a history of PE and DVT on Xarelto, Alzheimer's dementia (advanced), presenting from [facility] memory care facility today with concern for behavioral change and refusal to walk.  The patient is normally ambulatory at baseline.  However the staff noted that today was refusing to get up and walk.  They are concerned that he might be in pain.  Is not clear what the source of his pain may be.  The patient was nonverbal to staff today, which is unusual.  They did report that he had a mechanical fall approximately 2 or 3 days ago.  They are not sure if he struck his head.  He was not evaluated in the ER at that time.  The patient's primary point of care is a Sister Jordan Long.  The family is currently in discussions about placement in hospice care.  For the time being, the patient remains full code per the staff at [facility].  He was sick with Covid back in December.  They are not sure if has been vaccinated since then.  Chart review shows that his doctor had ordered a CMP and CBC on 5/12 to evaluate for behavior changes.  CMP was largely wnl, normal Cr, CBC with WBC 7.3, Hgb 11.9, platelets 277.  HPI     Past Medical History:  Diagnosis Date  . DVT (deep venous thrombosis) ([place]) 2010 and 10/2010   after Hernia repair in 2010 s/p IVC filter and coumadin. Next episode after Hernia repair  ( 10/23/2010) . Admitted on 11/02/2010 for repeat DVT. Warfarin was stopped for  5 days before surgery.   Marland Kitchen DVT (deep venous thrombosis) ([place]) 10/2011   right leg after knee surgery  . GERD (gastroesophageal reflux disease)   . Incisional hernia 08/20/2010  . Pulmonary embolism ([place]) 2010    Patient Active Problem List   Diagnosis Date Noted  . Physical deconditioning 04/21/2019  . Basal cell carcinoma (BCC) 12/27/2017  . Hypercoagulable state ([place]) 01/20/2016  . Chronic venous insufficiency 06/04/2015  . Alzheimer's dementia ([place]) 12/25/2013  . Dyslipidemia 06/13/2013  . Preventative health care 12/09/2012  . Osteoarthritis of knees, bilateral 07/13/2011  . GERD (gastroesophageal reflux disease) 07/13/2011  . Generalized anxiety disorder 03/02/2011    Past Surgical History:  Procedure Laterality Date  . APPENDECTOMY  age 34  . FEMORAL ARTERY STENT Right 2010  . HERNIA REPAIR  2010  . HERNIA REPAIR  10/23/2010   Incisional hernia w/mesh  . HERNIA REPAIR    . INCISIONAL HERNIA REPAIR N/A 09/22/2012   Procedure: INICISIONAL HERNIA AND BILATERAL RECURRENT INGUINAL HERNIA  REPAIRED [place] ;  Surgeon: Adin Hector, MD;  Location: [facility];  Service: General;  Laterality: N/A;  . INGUINAL HERNIA REPAIR Bilateral 09/22/2012   Procedure: LAPAROSCOPIC BILATERAL INGUINAL HERNIA REPAIR;  Surgeon: Adin Hector, MD;  Location: [facility];  Service: General;  Laterality: Bilateral;  RECURRENT INGUINAL HERNIAS BILATERAL   . INSERTION OF MESH Bilateral 09/22/2012   Procedure: INSERTION OF MESH;  Surgeon: Adin Hector, MD;  Location: [facility];  Service: General;  Laterality: Bilateral;  FOR REPAIR OF BILATERAL INGUINAL HERNIAS AND ALSO MESH INSERTED FOR VENTRAL HERNIA REPAIR   .  KNEE SURGERY Right 10/2011   [facility]  . LAPAROSCOPIC LYSIS OF ADHESIONS N/A 09/22/2012   Procedure: LAPAROSCOPIC LYSIS OF ADHESIONS 90 minutes ;  Surgeon: Adin Hector, MD;  Location: [facility];  Service: General;  Laterality: N/A;  . septic abdominal surgery  2010  . TONSILLECTOMY  as child       Family History  Problem Relation Age of Onset  . Cancer Father        Lymphoma   . Raynaud syndrome Father   . Diabetes Mother   . Parkinsonism Mother   . Heart disease Mother   . Schizophrenia Mother   . Thyroid disease Sister   .  Diabetes Sister   . Raynaud syndrome Sister   . Lupus Sister   . Raynaud syndrome Sister   . Heart attack Other        neice  . Colon cancer Neg Hx     Social History   Tobacco Use  . Smoking status: Former Smoker    Years: 2.00    Types: Cigarettes    Quit date: 09/23/1985    Years since quitting: 33.7  . Smokeless tobacco: Never Used  Substance Use Topics  . Alcohol use: No    Alcohol/week: 0.0 standard drinks  . Drug use: No    Home Medications Prior to Admission medications   Medication Sig Start Date End Date Taking? Authorizing Provider  donepezil (ARICEPT) 10 MG tablet Take 1 tablet (10 mg total) by mouth at bedtime. 12/10/17  Yes Axel Filler, MD  DULoxetine (CYMBALTA) 60 MG capsule Take 1 capsule (60 mg total) by mouth daily. 12/10/17  Yes Axel Filler, MD  Emollient [facility]) LOTN Apply two times per day as needed for itching and dry skin. Patient taking differently: Apply topically 2 (two) times daily.  08/18/18  Yes Seawell, Jaimie A, DO  feeding supplement, ENSURE ENLIVE, (ENSURE ENLIVE) LIQD Take 237 mLs by mouth 2 (two) times daily between meals. 03/21/19  Yes Axel Filler, MD  Multiple Vitamins-Minerals (MULTIVITAMIN ADULT) CHEW Chew 1 tablet by mouth daily. 04/21/19  Yes Neva Seat, MD  omeprazole (PRILOSEC) 20 MG capsule Take 1 capsule (20 mg total) by mouth daily. 12/10/17  Yes Axel Filler, MD  polyethylene glycol (MIRALAX / GLYCOLAX) 17 g packet Take 17 g by mouth daily as needed for moderate constipation or severe constipation. 01/26/19  Yes Annita Brod, MD  risperiDONE (RISPERDAL) 0.25 MG tablet Take 0.25 mg by mouth at bedtime. 06/17/19  Yes [provider]  XARELTO 20 MG TABS tablet Take 1 tablet (20 mg total) by mouth daily. 12/10/17  Yes Axel Filler, MD  acetaminophen (TYLENOL) 325 MG tablet Take 2 tablets (650 mg total) by mouth every 6 (six) hours as needed for up to 60 doses for mild  pain or moderate pain. 06/18/19   Wyvonnia Dusky, MD  acetaminophen (TYLENOL) 500 MG tablet Take 1 tablet (500 mg total) by mouth every 6 (six) hours as needed for headache. Patient not taking: Reported on 06/18/2019 02/05/18   Ina Homes, MD    Allergies    Patient has no known allergies.  Review of Systems   Review of Systems  Unable to perform ROS: Dementia (level 5 caveat)    Physical Exam Updated Vital Signs BP 112/65 (BP Location: Right Arm)   Pulse 60   Temp 98.4 F (36.9 C) (Oral)   Resp 16   Ht 5\' 7"  (1.702 m)   Wt  76 kg   SpO2 100%   BMI 26.24 kg/m   Physical Exam Vitals and nursing note reviewed.  Constitutional:      General: He is not in acute distress.    Appearance: He is well-developed.     Comments: Dementia, unable to provide reliable hx, will answer very simple questions  HENT:     Head: Normocephalic and atraumatic.  Eyes:     Conjunctiva/sclera: Conjunctivae normal.  Cardiovascular:     Rate and Rhythm: Normal rate and regular rhythm.     Heart sounds: Murmur present.  Pulmonary:     Effort: Pulmonary effort is normal. No respiratory distress.     Breath sounds: Normal breath sounds.  Abdominal:     Palpations: Abdomen is soft.     Tenderness: There is no abdominal tenderness.  Musculoskeletal:     Cervical back: Neck supple.     Comments: Full ROM at bilateral hips and knees (some stiffness of the joints) without any pain No spinal midline tenderness  Skin:    General: Skin is warm and dry.  Neurological:     Mental Status: He is alert.     ED Results / Procedures / Treatments   Labs (all labs ordered are listed, but only abnormal results are displayed) Labs Reviewed  COMPREHENSIVE METABOLIC PANEL - Abnormal; Notable for the following components:      Result Value   Glucose, Bld 104 (*)    Calcium 8.8 (*)    Albumin 3.0 (*)    All other components within normal limits  CBC - Abnormal; Notable for the following components:    Hemoglobin 11.9 (*)    HCT 38.3 (*)    All other components within normal limits  CBG MONITORING, ED - Abnormal; Notable for the following components:   Glucose-Capillary 174 (*)    All other components within normal limits  URINALYSIS, ROUTINE W REFLEX MICROSCOPIC  CBG MONITORING, ED    EKG None  Radiology CT Head Wo Contrast  Result Date: 06/18/2019 CLINICAL DATA:  Fall, head trauma. On Xarelto EXAM: CT HEAD WITHOUT CONTRAST TECHNIQUE: Contiguous axial images were obtained from the base of the skull through the vertex without intravenous contrast. COMPARISON:  01/17/2019 FINDINGS: Brain: No evidence of acute infarction, hemorrhage, hydrocephalus, extra-axial collection or mass lesion/mass effect. Scattered low-density changes within the periventricular and subcortical white matter compatible with chronic microvascular ischemic change. Moderate diffuse cerebral volume loss. Vascular: No hyperdense vessel or unexpected calcification. Skull: Normal. Negative for fracture or focal lesion. Sinuses/Orbits: Mild debris within the right maxillary sinus. Remaining paranasal sinuses and mastoid air cells are clear. Orbital structures unremarkable. Other: None. IMPRESSION: 1. No acute intracranial findings. 2. Chronic microvascular ischemic change and cerebral volume loss. Electronically Signed   By: Davina Poke D.O.   On: 06/18/2019 13:32   DG Chest Portable 1 View  Result Date: 06/18/2019 CLINICAL DATA:  Mechanical fall 2 days ago EXAM: PORTABLE CHEST 1 VIEW COMPARISON:  01/17/2019 FINDINGS: The heart size and mediastinal contours are within normal limits. No focal airspace consolidation, pleural effusion, or pneumothorax. No acute osseous findings. IMPRESSION: No active disease. Electronically Signed   By: Davina Poke D.O.   On: 06/18/2019 13:54   DG HIPS BILAT WITH PELVIS 3-4 VIEWS  Result Date: 06/18/2019 CLINICAL DATA:  Fall, bilateral hip pain EXAM: DG HIP (WITH OR WITHOUT PELVIS)  3-4V BILAT COMPARISON:  01/13/2009 FINDINGS: No acute fracture or dislocation. Moderate left and mild right hip osteoarthritis. Pelvic bony  ring is intact. Again seen is a venous filter in the region of the right external iliac vein, unchanged from 20/10. Vascular calcifications are noted. IMPRESSION: No acute fracture or dislocation of the bilateral hips. Electronically Signed   By: Davina Poke D.O.   On: 06/18/2019 13:57    Procedures Procedures (including critical care time)  Medications Ordered in ED Medications - No data to display  ED Course  I have reviewed the triage vital signs and the nursing notes.  Pertinent labs & imaging results that were available during my care of the patient were reviewed by me and considered in my medical decision making (see chart for details).  69 yo male w/ very significant alzheimer's dementia and progressive decline in function sent to ED from memory care facility with concern for behavioral changes over past several days, less verbal today than usual.  Staff was concerned that he appeared in pain.  He also had a fall 2 days ago.  He is on xarelto.  Here in the ED vitals are stable, he is not in any distress No evidence of hip tenderness or LE pain on exam.  Xrays of the hip without acute fx. No spinal tenderness on exam to suggest acute fx  CTH ordered for fall, chronic changes noted consistent with his alzheimer's status, but no acute ICH today  Labs ordered including CBC, CBG, BMP, UA without evidence of infection, sepsis, dehydration, or significant anemia today.  Discussed with family member Jordan Long at bedside.  I cannot find anything focal to suggest these changes.  They may be related to his dementia.  He also has osteoarthritis in his lower joints (which his sister Jordan Long told me has been giving him a lot of grief for years).  This may be his issue.  We'll discharge back to his care facility with tylenol script for pain.  He is followed by Dr  Lalla Brothers from IM, who can consider further outpatient work up as he deems necessary.  In the ED patient tolerating fluids.   Clinical Course as of Jun 18 2243  Sun Jun 18, 2019  1347 IMPRESSION: 1. No acute intracranial findings. 2. Chronic microvascular ischemic change and cerebral volume loss.   [MT]  41 Sister and niece updated about workup.  No acute findings.  No pain on my exam.  Will discahrge back to [facility]   [MT]    Clinical Course User Index [MT] Hydia Copelin, Carola Rhine, MD    Final Clinical Impression(s) / ED Diagnoses Final diagnoses:  Fall, initial encounter    Rx / DC Orders ED Discharge Orders         Ordered    acetaminophen (TYLENOL) 325 MG tablet  Every 6 hours PRN     06/18/19 1556           Wyvonnia Dusky, MD 06/18/19 2250

## 2019-06-18 NOTE — ED Notes (Signed)
[facility] called for transport back to [facility]. 

## 2019-06-18 NOTE — ED Notes (Signed)
[phone] , [name], sister would like an update

## 2019-06-19 ENCOUNTER — Telehealth: Payer: Self-pay | Admitting: Student in an Organized Health Care Education/Training Program

## 2019-06-19 DIAGNOSIS — K219 Gastro-esophageal reflux disease without esophagitis: Secondary | ICD-10-CM | POA: Diagnosis not present

## 2019-06-19 DIAGNOSIS — I959 Hypotension, unspecified: Secondary | ICD-10-CM | POA: Diagnosis not present

## 2019-06-19 DIAGNOSIS — E663 Overweight: Secondary | ICD-10-CM | POA: Diagnosis not present

## 2019-06-19 DIAGNOSIS — M170 Bilateral primary osteoarthritis of knee: Secondary | ICD-10-CM | POA: Diagnosis not present

## 2019-06-19 DIAGNOSIS — F0280 Dementia in other diseases classified elsewhere without behavioral disturbance: Secondary | ICD-10-CM

## 2019-06-19 DIAGNOSIS — F411 Generalized anxiety disorder: Secondary | ICD-10-CM | POA: Diagnosis not present

## 2019-06-19 DIAGNOSIS — Z86718 Personal history of other venous thrombosis and embolism: Secondary | ICD-10-CM | POA: Diagnosis not present

## 2019-06-19 DIAGNOSIS — G309 Alzheimer's disease, unspecified: Secondary | ICD-10-CM | POA: Diagnosis not present

## 2019-06-19 DIAGNOSIS — E785 Hyperlipidemia, unspecified: Secondary | ICD-10-CM | POA: Diagnosis not present

## 2019-06-19 NOTE — Telephone Encounter (Signed)
Spoke with Jordan Long today given patient's ED visit yesterday. It seems he is continuing to decline in terms of functional status and now having symptoms of pain in his legs which is new. Work up in the ED was without acute injury or illness. I think the driver for this decline in his status is progressive Alzheimer Dementia and I anticipate he is approaching end of life, based on low oral intake and declining functioning. We talked again about hospice care, I think it is the right time now to involve home hospice care. He is currently living at an ALF memory unit. I am not sure how much longer he will be able to stay there if he is not independent in his ADLs and if he has increasing nursing needs. May need to move to SNF soon with hospice services.

## 2019-06-20 ENCOUNTER — Ambulatory Visit: Payer: Medicare HMO | Admitting: *Deleted

## 2019-06-20 ENCOUNTER — Ambulatory Visit: Payer: Medicare HMO

## 2019-06-20 DIAGNOSIS — F0280 Dementia in other diseases classified elsewhere without behavioral disturbance: Secondary | ICD-10-CM

## 2019-06-20 DIAGNOSIS — I872 Venous insufficiency (chronic) (peripheral): Secondary | ICD-10-CM

## 2019-06-20 DIAGNOSIS — G309 Alzheimer's disease, unspecified: Secondary | ICD-10-CM

## 2019-06-20 NOTE — Patient Instructions (Signed)
Visit Information  Goals Addressed            This Visit's Progress     Patient Stated   . " My doctor says I need Hospice Care" (pt-stated)       CARE PLAN ENTRY (see longitudinal plan of care for additional care plan information)   Current Barriers:  . Chronic Disease Management support, education, and care coordination needs related to HLD and Dementia  Case Manager Clinical Goal(s):  Marland Kitchen Over the next 30 days, patient will work with BSW to address needs related to Level of care concerns with Referral to Hospice in patient with HLD and Dementia  Interventions:  . Collaborated with BSW to initiate plan of care to address needs related to Level of care concerns and Referral to Hospice in patient with HLD and Dementia  Patient Self Care Activities:  . Patient's family  verbalizes understanding of plan to refer patient to Hospice for end of life care  Initial goal documentation        Mr. Dorminey was given information about Chronic Care Management services today including:  1. CCM service includes personalized support from designated clinical staff supervised by his physician, including individualized plan of care and coordination with other care providers 2. 24/7 contact phone numbers for assistance for urgent and routine care needs. 3. Service will only be billed when office clinical staff spend 20 minutes or more in a month to coordinate care. 4. Only one practitioner may furnish and bill the service in a calendar month. 5. The patient may stop CCM services at any time (effective at the end of the month) by phone call to the office staff. 6. The patient will be responsible for cost sharing (co-pay) of up to 20% of the service fee (after annual deductible is met).  Patient's guardian agreed to services and verbal consent obtained.   The patient's guardian verbalized understanding of instructions provided today and declined a print copy of patient instruction materials.    The care management team will reach out to the patient again over the next 30 days.   Kelli Churn RN, CCM, [facility] RN Care Manager [phone]

## 2019-06-20 NOTE — Chronic Care Management (AMB) (Signed)
Chronic Care Management   Initial Visit Note  06/20/2019 Name: Edin [facility]. MRN: [id number] DOB: 1950/05/26  Referred by: Axel Filler, MD Reason for referral : Chronic Care Management (dementia, PE, DVT, HLD)   Jordan Long. is a 69 y.o. year old male who is a primary care patient of Axel Filler, MD. The CCM team was consulted for assistance with chronic disease management and care coordination needs related to HLD and Dementia  Review of patient status, including review of consultants reports, relevant laboratory and other test results, and collaboration with appropriate care team members and the patient's provider was performed as part of comprehensive patient evaluation and provision of chronic care management services.    SDOH (Social Determinants of Health) assessments performed: No See Care Plan activities for detailed interventions related to SDOH     Medications: Outpatient Encounter Medications as of 06/20/2019  Medication Sig Note  . acetaminophen (TYLENOL) 325 MG tablet Take 2 tablets (650 mg total) by mouth every 6 (six) hours as needed for up to 60 doses for mild pain or moderate pain.   Marland Kitchen acetaminophen (TYLENOL) 500 MG tablet Take 1 tablet (500 mg total) by mouth every 6 (six) hours as needed for headache. (Patient not taking: Reported on 06/18/2019)   . donepezil (ARICEPT) 10 MG tablet Take 1 tablet (10 mg total) by mouth at bedtime.   . DULoxetine (CYMBALTA) 60 MG capsule Take 1 capsule (60 mg total) by mouth daily.   . Emollient (CERAVE) LOTN Apply two times per day as needed for itching and dry skin. (Patient taking differently: Apply topically 2 (two) times daily. )   . feeding supplement, ENSURE ENLIVE, (ENSURE ENLIVE) LIQD Take 237 mLs by mouth 2 (two) times daily between meals.   . Multiple Vitamins-Minerals (MULTIVITAMIN ADULT) CHEW Chew 1 tablet by mouth daily.   Marland Kitchen omeprazole (PRILOSEC) 20 MG capsule Take 1 capsule  (20 mg total) by mouth daily.   . polyethylene glycol (MIRALAX / GLYCOLAX) 17 g packet Take 17 g by mouth daily as needed for moderate constipation or severe constipation.   . risperiDONE (RISPERDAL) 0.25 MG tablet Take 0.25 mg by mouth at bedtime.   Alveda Reasons 20 MG TABS tablet Take 1 tablet (20 mg total) by mouth daily. 01/18/2019: Pt takes at 08:00   No facility-administered encounter medications on file as of 06/20/2019.     Objective:   Goals Addressed            This Visit's Progress     Patient Stated   . " My doctor says I need Hospice Care" (pt-stated)       CARE PLAN ENTRY (see longitudinal plan of care for additional care plan information)   Current Barriers:  . Chronic Disease Management support, education, and care coordination needs related to HLD and Dementia  Case Manager Clinical Goal(s):  Marland Kitchen Over the next 30 days, patient will work with BSW to address needs related to Level of care concerns with Referral to Hospice in patient with HLD and Dementia  Interventions:  . Collaborated with BSW to initiate plan of care to address needs related to Level of care concerns and Referral to Hospice in patient with HLD and Dementia  Patient Self Care Activities:  . Patient's family  verbalizes understanding of plan to refer patient to Hospice for end of life care  Initial goal documentation         Mr. Hosek was given information about Chronic  Care Management services today including:  1. CCM service includes personalized support from designated clinical staff supervised by his physician, including individualized plan of care and coordination with other care providers 2. 24/7 contact phone numbers for assistance for urgent and routine care needs. 3. Service will only be billed when office clinical staff spend 20 minutes or more in a month to coordinate care. 4. Only one practitioner may furnish and bill the service in a calendar month. 5. The patient may stop CCM  services at any time (effective at the end of the month) by phone call to the office staff. 6. The patient will be responsible for cost sharing (co-pay) of up to 20% of the service fee (after annual deductible is met).  Patient's guardian  agreed to services and verbal consent obtained.   Plan:   The care management team will reach out to the patient again over the next 30 days.   Kelli Churn RN, CCM, [facility] RN Care Manager [phone]

## 2019-06-20 NOTE — Chronic Care Management (AMB) (Signed)
Chronic Care Management    Clinical Social Work General Note  06/20/2019 Name: Jordan Decou [facility] MRN: [id number] DOB: Nov 03, 1950  Jordan Peters Tarick Parenteau. is a 69 y.o. year old male who is a primary care patient of Axel Filler, MD. The CCM was consulted to assist the patient with Level of Care Concerns.   Jordan Long was given information about Chronic Care Management services today including:  1. CCM service includes personalized support from designated clinical staff supervised by his physician, including individualized plan of care and coordination with other care providers 2. 24/7 contact phone numbers for assistance for urgent and routine care needs. 3. Service will only be billed when office clinical staff spend 20 minutes or more in a month to coordinate care. 4. Only one practitioner may furnish and bill the service in a calendar month. 5. The patient may stop CCM services at any time (effective at the end of the month) by phone call to the office staff. 6. The patient will be responsible for cost sharing (co-pay) of up to 20% of the service fee (after annual deductible is met).    Review of patient status, including review of consultants reports, relevant laboratory and other test results, and collaboration with appropriate care team members and the patient's provider was performed as part of comprehensive patient evaluation and provision of chronic care management services.    SDOH (Social Determinants of Health) assessments and interventions performed:  No    Outpatient Encounter Medications as of 06/20/2019  Medication Sig Note  . acetaminophen (TYLENOL) 325 MG tablet Take 2 tablets (650 mg total) by mouth every 6 (six) hours as needed for up to 60 doses for mild pain or moderate pain.   Marland Kitchen acetaminophen (TYLENOL) 500 MG tablet Take 1 tablet (500 mg total) by mouth every 6 (six) hours as needed for headache. (Patient not taking: Reported on 06/18/2019)    . donepezil (ARICEPT) 10 MG tablet Take 1 tablet (10 mg total) by mouth at bedtime.   . DULoxetine (CYMBALTA) 60 MG capsule Take 1 capsule (60 mg total) by mouth daily.   . Emollient (CERAVE) LOTN Apply two times per day as needed for itching and dry skin. (Patient taking differently: Apply topically 2 (two) times daily. )   . feeding supplement, ENSURE ENLIVE, (ENSURE ENLIVE) LIQD Take 237 mLs by mouth 2 (two) times daily between meals.   . Multiple Vitamins-Minerals (MULTIVITAMIN ADULT) CHEW Chew 1 tablet by mouth daily.   Marland Kitchen omeprazole (PRILOSEC) 20 MG capsule Take 1 capsule (20 mg total) by mouth daily.   . polyethylene glycol (MIRALAX / GLYCOLAX) 17 g packet Take 17 g by mouth daily as needed for moderate constipation or severe constipation.   . risperiDONE (RISPERDAL) 0.25 MG tablet Take 0.25 mg by mouth at bedtime.   Alveda Reasons 20 MG TABS tablet Take 1 tablet (20 mg total) by mouth daily. 01/18/2019: Pt takes at 08:00   No facility-administered encounter medications on file as of 06/20/2019.    Goals Addressed            This Visit's Progress   . "My doctor is recommending hospice care" (pt-stated)       Current Barriers:  Marland Kitchen Knowledge Barriers related to resources available to address  Level of care concerns  Case Manager Clinical Goal(s):  Marland Kitchen Over the next 14 days, patient will work with BSW to address needs related to Level of care concerns/initiation of hospice services.   . Over  the next 14 days, BSW will collaborate with RN Care Manager to address care management and care coordination needs  Interventions:  . Talked to [facility] Director about provider recommendation for hospice care.  Patient can remain at facility and receive services.  Once order is received, family will be contacted to discuss options of service provider and process for initiation of services.    . Faxed order for services to [facility]; [phone] . Contacted [name] to ensure receipt  of order.  She confirmed receipt and stated that she would contact patient's family today to discuss service provider and initiation of services . Provided [name] with my contact information and requested follow up call  . Collaborated with RN Care Manager and patient to establish an individualized plan of care   Patient Self Care Activities:  . Patient approaching end of life  Initial goal documentation         Follow Up Plan: Will follow up before the end of the week regarding status of hopsice services.          Ronn Melena, [place] Coordination Social Worker [place] [phone]

## 2019-06-20 NOTE — Patient Instructions (Signed)
Licensed Clinical Social Worker Visit Information  Goals we discussed today:  Goals Addressed            This Visit's Progress   . "My doctor is recommending hospice care" (pt-stated)       Current Barriers:  Marland Kitchen Knowledge Barriers related to resources available to address  Level of care concerns  Case Manager Clinical Goal(s):  Marland Kitchen Over the next 14 days, patient will work with BSW to address needs related to Level of care concerns/initiation of hospice services.   . Over the next 14 days, BSW will collaborate with RN Care Manager to address care management and care coordination needs  Interventions:  . Talked to [facility] Director about provider recommendation for hospice care.  Patient can remain at facility and receive services.  Once order is received, family will be contacted to discuss options of service provider and process for initiation of services.    . Faxed order for services to [facility]; [phone] . Contacted [name] to ensure receipt of order.  She confirmed receipt and stated that she would contact patient's family today to discuss service provider and initiation of services . Provided [name] with my contact information and requested follow up call  . Collaborated with RN Care Manager and patient to establish an individualized plan of care   Patient Self Care Activities:  . Patient approaching end of life  Initial goal documentation         Materials provided: No:   Jordan Long was given information about Chronic Care Management services today including:  1. CCM service includes personalized support from designated clinical staff supervised by his physician, including individualized plan of care and coordination with other care providers 2. 24/7 contact phone numbers for assistance for urgent and routine care needs. 3. Service will only be billed when office clinical staff spend 20 minutes or more in a month to coordinate care. 4. Only  one practitioner may furnish and bill the service in a calendar month. 5. The patient may stop CCM services at any time (effective at the end of the month) by phone call to the office staff. 6. The patient will be responsible for cost sharing (co-pay) of up to 20% of the service fee (after annual deductible is met).    Will follow up before the end of the week regarding status of hopsice services.        Jordan Long, [place] Coordination Social Worker [place] [phone]

## 2019-06-21 DIAGNOSIS — E785 Hyperlipidemia, unspecified: Secondary | ICD-10-CM | POA: Diagnosis not present

## 2019-06-21 DIAGNOSIS — K219 Gastro-esophageal reflux disease without esophagitis: Secondary | ICD-10-CM | POA: Diagnosis not present

## 2019-06-21 DIAGNOSIS — G309 Alzheimer's disease, unspecified: Secondary | ICD-10-CM | POA: Diagnosis not present

## 2019-06-21 DIAGNOSIS — Z86718 Personal history of other venous thrombosis and embolism: Secondary | ICD-10-CM | POA: Diagnosis not present

## 2019-06-21 DIAGNOSIS — I959 Hypotension, unspecified: Secondary | ICD-10-CM | POA: Diagnosis not present

## 2019-06-21 DIAGNOSIS — F0280 Dementia in other diseases classified elsewhere without behavioral disturbance: Secondary | ICD-10-CM | POA: Diagnosis not present

## 2019-06-21 DIAGNOSIS — F411 Generalized anxiety disorder: Secondary | ICD-10-CM | POA: Diagnosis not present

## 2019-06-21 DIAGNOSIS — M170 Bilateral primary osteoarthritis of knee: Secondary | ICD-10-CM | POA: Diagnosis not present

## 2019-06-21 DIAGNOSIS — E663 Overweight: Secondary | ICD-10-CM | POA: Diagnosis not present

## 2019-06-21 NOTE — Progress Notes (Signed)
Internal Medicine Clinic Resident  I have personally reviewed this encounter including the documentation in this note and/or discussed this patient with the care management provider. I will address any urgent items identified by the care management provider and will communicate my actions to the patient's PCP. I have reviewed the patient's CCM visit with my supervising attending, Dr Lynnae January.  Harvie Heck, MD  Internal Medicine, PGY-1

## 2019-06-22 ENCOUNTER — Telehealth: Payer: Self-pay | Admitting: Student in an Organized Health Care Education/Training Program

## 2019-06-22 ENCOUNTER — Other Ambulatory Visit: Payer: Self-pay | Admitting: *Deleted

## 2019-06-22 ENCOUNTER — Telehealth: Payer: Self-pay

## 2019-06-22 ENCOUNTER — Ambulatory Visit: Payer: Medicare HMO

## 2019-06-22 ENCOUNTER — Telehealth: Payer: Medicare HMO

## 2019-06-22 MED ORDER — MORPHINE SULFATE 10 MG/5ML PO SOLN: 5.0000 mg | Freq: Four times a day (QID) | ORAL | 0 refills | Status: DC | PRN

## 2019-06-22 NOTE — Telephone Encounter (Signed)
Yes, thank you.

## 2019-06-22 NOTE — Telephone Encounter (Signed)
Perfect. Thanks for the follow up.

## 2019-06-22 NOTE — Telephone Encounter (Signed)
Ok. I spoke with Jordan Long, given goals of hospice and end of life care, I will prescribe morphine solution to be used orally on an as needed basis for pain. I don't want to schedule it right now to avoid sedation. Jordan Long is in agreement. We talked about what to expect and prognosis. It sounds like home hospice services will be starting within the next few days.

## 2019-06-22 NOTE — Telephone Encounter (Signed)
Talked to Malachi Pro at [facility] - stated pt is having a lot pain when being turned and getting out of bed. Requesting rx for 3 times a day and PRN breakthrough pain; fax to [phone] then she will fax to [facility]. Stated hospice referral is in process; pt needs something in the mean time.  Also aware pt does not have to come to appt scheduled this morning in [facility]. Thanks

## 2019-06-22 NOTE — Telephone Encounter (Signed)
Morphine rx faxed to Malachi Pro [phone]); confirmation received.

## 2019-06-22 NOTE — Telephone Encounter (Signed)
VO continue PT 2x week for 4 weeks , 1x week for 2 weeks, strengthening, balance, safety Do you agree

## 2019-06-22 NOTE — Telephone Encounter (Signed)
Pls contact flore at [facility] for VO [phone]

## 2019-06-22 NOTE — Telephone Encounter (Signed)
I called [facility], spoke to [place], about why "Pre-filled syringes"  Had to be on the rx. She stated assisted living facilities have med techs not nurse; so this is easier and less likely of a mistake. I asked if  The doctor has to send another rx, she stated no b/c it has already been done and being sent to the facility.

## 2019-06-22 NOTE — Telephone Encounter (Signed)
Received TC from patient's daughter-in-law, Kerrin Coit.  She is asking to speak to Dr. Evette Doffing.  She states the children have questions regarding hospice and patient's disease process, such as: 1. Why morphine is used vs. Other pain medications? 2. What is the relationship between his leg pain and the dementia?  [name] states it is her understanding PCP wanted to speak to the children, and she is requesting a call from PCP at [phone]. Thank you, SChaplin, RN,BSN

## 2019-06-22 NOTE — Telephone Encounter (Signed)
Call from [facility], Investment banker, operational - stated they received rx for Morphine but requesting to re-send with "Pre-filled syringes" on rx. Thanks

## 2019-06-22 NOTE — Telephone Encounter (Signed)
Thank you. I spoke with [name]. Answered all questions and oriented her to hospice and their services. Sounds like they have signed up with a company today, so services should start soon.

## 2019-06-22 NOTE — Addendum Note (Signed)
Addended by: Lalla Brothers T on: 06/22/2019 10:00 AM   Modules accepted: Orders

## 2019-06-23 ENCOUNTER — Ambulatory Visit: Payer: Medicare HMO

## 2019-06-23 DIAGNOSIS — G309 Alzheimer's disease, unspecified: Secondary | ICD-10-CM | POA: Diagnosis not present

## 2019-06-23 DIAGNOSIS — F411 Generalized anxiety disorder: Secondary | ICD-10-CM | POA: Diagnosis not present

## 2019-06-23 DIAGNOSIS — F0280 Dementia in other diseases classified elsewhere without behavioral disturbance: Secondary | ICD-10-CM | POA: Diagnosis not present

## 2019-06-23 DIAGNOSIS — Z86718 Personal history of other venous thrombosis and embolism: Secondary | ICD-10-CM | POA: Diagnosis not present

## 2019-06-23 DIAGNOSIS — I872 Venous insufficiency (chronic) (peripheral): Secondary | ICD-10-CM

## 2019-06-23 DIAGNOSIS — M170 Bilateral primary osteoarthritis of knee: Secondary | ICD-10-CM | POA: Diagnosis not present

## 2019-06-23 DIAGNOSIS — K219 Gastro-esophageal reflux disease without esophagitis: Secondary | ICD-10-CM | POA: Diagnosis not present

## 2019-06-23 DIAGNOSIS — E663 Overweight: Secondary | ICD-10-CM | POA: Diagnosis not present

## 2019-06-23 DIAGNOSIS — I959 Hypotension, unspecified: Secondary | ICD-10-CM | POA: Diagnosis not present

## 2019-06-23 DIAGNOSIS — E785 Hyperlipidemia, unspecified: Secondary | ICD-10-CM | POA: Diagnosis not present

## 2019-06-23 NOTE — Chronic Care Management (AMB) (Signed)
  Care Management   Follow Up Note   06/23/2019 Name: Carman Flach [facility]. MRN: [id number] DOB: 12-Jan-1951  Referred by: Axel Filler, MD Reason for referral : Care Coordination [facility])   [name] Raynier Musolino. is a 69 y.o. year old male who is a primary care patient of Axel Filler, MD. The care management team was consulted for assistance with care management and care coordination needs.    Review of patient status, including review of consultants reports, relevant laboratory and other test results, and collaboration with appropriate care team members and the patient's provider was performed as part of comprehensive patient evaluation and provision of chronic care management services.    SDOH (Social Determinants of Health) assessments performed: No See Care Plan activities for detailed interventions related to [facility])     Advanced Directives: See Care Plan and Vynca application for related entries.   Goals Addressed            This Visit's Progress   . "My doctor is recommending hospice care" (pt-stated)       Current Barriers:  Marland Kitchen Knowledge Barriers related to resources available to address  Level of care concerns  Case Manager Clinical Goal(s):  Marland Kitchen Over the next 14 days, patient will work with BSW to address needs related to Level of care concerns/initiation of hospice services.   . Over the next 14 days, BSW will collaborate with RN Care Manager to address care management and care coordination needs  Interventions:  . Attempted to collaborate with Modena Slater at [facility] but she was not available.    Patient Self Care Activities:  . Patient's family  verbalizes understanding of plan to refer patient to Hospice for end of life care  Please see past updates related to this goal by clicking on the "Past Updates" button in the selected goal         Will follow up with family to ensure initiation of services.        Ronn Melena, [place] Coordination Social Worker [place] [phone]

## 2019-06-23 NOTE — Patient Instructions (Signed)
Visit Information  Goals Addressed            This Visit's Progress   . "My doctor is recommending hospice care" (pt-stated)       Current Barriers:  Marland Kitchen Knowledge Barriers related to resources available to address  Level of care concerns  Case Manager Clinical Goal(s):  Marland Kitchen Over the next 14 days, patient will work with BSW to address needs related to Level of care concerns/initiation of hospice services.   . Over the next 14 days, BSW will collaborate with RN Care Manager to address care management and care coordination needs  Interventions:  . Attempted to collaborate with Modena Slater at [facility] but she was not available.    Patient Self Care Activities:  . Patient's family  verbalizes understanding of plan to refer patient to Hospice for end of life care  Please see past updates related to this goal by clicking on the "Past Updates" button in the selected goal         Will follow up with family to ensure initiation of services.        Ronn Melena, [place] Coordination Social Worker [place] [phone]

## 2019-06-27 ENCOUNTER — Ambulatory Visit: Payer: Medicare HMO

## 2019-06-27 DIAGNOSIS — E663 Overweight: Secondary | ICD-10-CM | POA: Diagnosis not present

## 2019-06-27 DIAGNOSIS — F0280 Dementia in other diseases classified elsewhere without behavioral disturbance: Secondary | ICD-10-CM

## 2019-06-27 DIAGNOSIS — I959 Hypotension, unspecified: Secondary | ICD-10-CM | POA: Diagnosis not present

## 2019-06-27 DIAGNOSIS — I872 Venous insufficiency (chronic) (peripheral): Secondary | ICD-10-CM

## 2019-06-27 DIAGNOSIS — K219 Gastro-esophageal reflux disease without esophagitis: Secondary | ICD-10-CM | POA: Diagnosis not present

## 2019-06-27 DIAGNOSIS — G309 Alzheimer's disease, unspecified: Secondary | ICD-10-CM | POA: Diagnosis not present

## 2019-06-27 DIAGNOSIS — E785 Hyperlipidemia, unspecified: Secondary | ICD-10-CM | POA: Diagnosis not present

## 2019-06-27 DIAGNOSIS — Z86718 Personal history of other venous thrombosis and embolism: Secondary | ICD-10-CM | POA: Diagnosis not present

## 2019-06-27 DIAGNOSIS — F411 Generalized anxiety disorder: Secondary | ICD-10-CM | POA: Diagnosis not present

## 2019-06-27 DIAGNOSIS — M170 Bilateral primary osteoarthritis of knee: Secondary | ICD-10-CM | POA: Diagnosis not present

## 2019-06-27 NOTE — Patient Instructions (Signed)
Visit Information  Goals Addressed            This Visit's Progress   . COMPLETED: " My doctor says I need Hospice Care" (pt-stated)       CARE PLAN ENTRY (see longitudinal plan of care for additional care plan information)   Current Barriers:  . Chronic Disease Management support, education, and care coordination needs related to HLD and Dementia  Case Manager Clinical Goal(s):  Marland Kitchen Over the next 30 days, patient will work with BSW to address needs related to Level of care concerns with Referral to Hospice in patient with HLD and Dementia  Interventions:  . Left message for Modena Slater at [facility] regarding status of hospice services . Talked with patient's sister, Ledora Bottcher and she confirmed that hospice services have initiated.   Patient Self Care Activities:  . Patient's family  verbalizes understanding of plan to refer patient to Hospice for end of life care  Please see past updates related to this goal by clicking on the "Past Updates" button in the selected goal      . COMPLETED: "My doctor is recommending hospice care" (pt-stated)       Current Barriers:  Marland Kitchen Knowledge Barriers related to resources available to address  Level of care concerns  Case Manager Clinical Goal(s):  Marland Kitchen Over the next 14 days, patient will work with BSW to address needs related to Level of care concerns/initiation of hospice services.   . Over the next 14 days, BSW will collaborate with RN Care Manager to address care management and care coordination needs  Interventions:  . Left message for Modena Slater at [facility] regarding status of hospice services . Talked with patient's sister, Ledora Bottcher and she confirmed that hospice services have initiated.   Patient Self Care Activities:  . Patient's family  verbalizes understanding of plan to refer patient to Hospice for end of life care  Please see past updates related to this goal by clicking on the "Past Updates" button in the  selected goal           Ethridge Sollenberger, [place] Worker [place] [phone]

## 2019-06-27 NOTE — Progress Notes (Signed)
Internal Medicine Clinic Resident  I have personally reviewed this encounter including the documentation in this note and/or discussed this patient with the care management provider. I will address any urgent items identified by the care management provider and will communicate my actions to the patient's PCP. I have reviewed the patient's CCM visit with my supervising attending, Dr Butcher.  Mariana Goytia, MD 06/27/2019    

## 2019-06-27 NOTE — Chronic Care Management (AMB) (Signed)
  Care Management   Follow Up Note   06/27/2019 Name: [facility]. MRN: [id number] DOB: 1950/09/03  Referred by: Axel Filler, MD Reason for referral : Care Coordination [facility])   Jordan Long. is a 69 y.o. year old male who is a primary care patient of Axel Filler, MD. The care management team was consulted for assistance with care management and care coordination needs.    Review of patient status, including review of consultants reports, relevant laboratory and other test results, and collaboration with appropriate care team members and the patient's provider was performed as part of comprehensive patient evaluation and provision of chronic care management services.    SDOH (Social Determinants of Health) assessments performed: No See Care Plan activities for detailed interventions related to [facility])     Advanced Directives: See Care Plan and Vynca application for related entries.   Goals Addressed            This Visit's Progress   . COMPLETED: " My doctor says I need Hospice Care" (pt-stated)       CARE PLAN ENTRY (see longitudinal plan of care for additional care plan information)   Current Barriers:  . Chronic Disease Management support, education, and care coordination needs related to HLD and Dementia  Case Manager Clinical Goal(s):  Marland Kitchen Over the next 30 days, patient will work with BSW to address needs related to Level of care concerns with Referral to Hospice in patient with HLD and Dementia  Interventions:  . Left message for Modena Slater at [facility] regarding status of hospice services . Talked with patient's sister, Jordan Long and she confirmed that hospice services have initiated.   Patient Self Care Activities:  . Patient's family  verbalizes understanding of plan to refer patient to Hospice for end of life care  Please see past updates related to this goal by clicking on the "Past Updates"  button in the selected goal      . COMPLETED: "My doctor is recommending hospice care" (pt-stated)       Current Barriers:  Marland Kitchen Knowledge Barriers related to resources available to address  Level of care concerns  Case Manager Clinical Goal(s):  Marland Kitchen Over the next 14 days, patient will work with BSW to address needs related to Level of care concerns/initiation of hospice services.   . Over the next 14 days, BSW will collaborate with RN Care Manager to address care management and care coordination needs  Interventions:  . Left message for Modena Slater at [facility] regarding status of hospice services . Talked with patient's sister, Jordan Long and she confirmed that hospice services have initiated.   Patient Self Care Activities:  . Patient's family  verbalizes understanding of plan to refer patient to Hospice for end of life care  Please see past updates related to this goal by clicking on the "Past Updates" button in the selected goal           Patient's sister, Jordan Long, requested that Dr. Evette Doffing be notified that patient has been prescribed Tylenol Arthritis Pain every 8 hours.  Morphine is on site if eventually needed for pain.       Ronn Melena, [place] Coordination Social Worker [place] [phone]

## 2019-06-28 NOTE — Progress Notes (Signed)
Internal Medicine Clinic Attending  CCM services provided by the care management provider and their documentation were discussed with Dr. Chundi. We reviewed the pertinent findings, urgent action items addressed by the resident and non-urgent items to be addressed by the PCP.  I agree with the assessment, diagnosis, and plan of care documented in the CCM and resident's note.  Elizabeth Butcher, MD 06/28/2019 

## 2019-06-30 DIAGNOSIS — I959 Hypotension, unspecified: Secondary | ICD-10-CM | POA: Diagnosis not present

## 2019-06-30 DIAGNOSIS — F411 Generalized anxiety disorder: Secondary | ICD-10-CM | POA: Diagnosis not present

## 2019-06-30 DIAGNOSIS — Z86718 Personal history of other venous thrombosis and embolism: Secondary | ICD-10-CM | POA: Diagnosis not present

## 2019-06-30 DIAGNOSIS — G309 Alzheimer's disease, unspecified: Secondary | ICD-10-CM | POA: Diagnosis not present

## 2019-06-30 DIAGNOSIS — M170 Bilateral primary osteoarthritis of knee: Secondary | ICD-10-CM | POA: Diagnosis not present

## 2019-06-30 DIAGNOSIS — E785 Hyperlipidemia, unspecified: Secondary | ICD-10-CM | POA: Diagnosis not present

## 2019-06-30 DIAGNOSIS — F0280 Dementia in other diseases classified elsewhere without behavioral disturbance: Secondary | ICD-10-CM | POA: Diagnosis not present

## 2019-06-30 DIAGNOSIS — K219 Gastro-esophageal reflux disease without esophagitis: Secondary | ICD-10-CM | POA: Diagnosis not present

## 2019-06-30 DIAGNOSIS — E663 Overweight: Secondary | ICD-10-CM | POA: Diagnosis not present

## 2019-07-05 ENCOUNTER — Telehealth: Payer: Self-pay | Admitting: Student in an Organized Health Care Education/Training Program

## 2019-07-05 DIAGNOSIS — F411 Generalized anxiety disorder: Secondary | ICD-10-CM | POA: Diagnosis not present

## 2019-07-05 DIAGNOSIS — E663 Overweight: Secondary | ICD-10-CM | POA: Diagnosis not present

## 2019-07-05 DIAGNOSIS — E785 Hyperlipidemia, unspecified: Secondary | ICD-10-CM | POA: Diagnosis not present

## 2019-07-05 DIAGNOSIS — M170 Bilateral primary osteoarthritis of knee: Secondary | ICD-10-CM | POA: Diagnosis not present

## 2019-07-05 DIAGNOSIS — K219 Gastro-esophageal reflux disease without esophagitis: Secondary | ICD-10-CM | POA: Diagnosis not present

## 2019-07-05 DIAGNOSIS — F0280 Dementia in other diseases classified elsewhere without behavioral disturbance: Secondary | ICD-10-CM | POA: Diagnosis not present

## 2019-07-05 DIAGNOSIS — G309 Alzheimer's disease, unspecified: Secondary | ICD-10-CM | POA: Diagnosis not present

## 2019-07-05 DIAGNOSIS — I959 Hypotension, unspecified: Secondary | ICD-10-CM | POA: Diagnosis not present

## 2019-07-05 DIAGNOSIS — Z86718 Personal history of other venous thrombosis and embolism: Secondary | ICD-10-CM | POA: Diagnosis not present

## 2019-07-05 NOTE — Telephone Encounter (Signed)
Theophilus Bones, RN; Skeet Latch; Sheila Oats, Delaney Meigs, Melissa    Hello Lauren!   Our local retail team tried to call the patient 3 times, then on the 3rd attempt they were told that he no longer needs the RW.   5/4 - mailbox full  5/14 - mailbox full  5/20 - pt no longer needs   If the patient would still like to pick up they can go to the retail store in [place] and pick it up.   Thank you!

## 2019-07-05 NOTE — Telephone Encounter (Signed)
Skeet Latch at [facility] was notified and responded on 06/01/2019 for order for rolling walker and compression stockings. Community message sent to her today for status update. Hubbard Hartshorn, BSN, RN-BC

## 2019-07-05 NOTE — Telephone Encounter (Signed)
Patient sister calling to f/u with a DME order for a Walker.  Please call back.

## 2019-07-06 DIAGNOSIS — F411 Generalized anxiety disorder: Secondary | ICD-10-CM | POA: Diagnosis not present

## 2019-07-06 DIAGNOSIS — G309 Alzheimer's disease, unspecified: Secondary | ICD-10-CM | POA: Diagnosis not present

## 2019-07-06 DIAGNOSIS — I872 Venous insufficiency (chronic) (peripheral): Secondary | ICD-10-CM | POA: Diagnosis not present

## 2019-07-06 DIAGNOSIS — F0280 Dementia in other diseases classified elsewhere without behavioral disturbance: Secondary | ICD-10-CM | POA: Diagnosis not present

## 2019-07-06 DIAGNOSIS — M170 Bilateral primary osteoarthritis of knee: Secondary | ICD-10-CM | POA: Diagnosis not present

## 2019-07-06 DIAGNOSIS — I959 Hypotension, unspecified: Secondary | ICD-10-CM | POA: Diagnosis not present

## 2019-07-06 DIAGNOSIS — E785 Hyperlipidemia, unspecified: Secondary | ICD-10-CM | POA: Diagnosis not present

## 2019-07-06 DIAGNOSIS — K219 Gastro-esophageal reflux disease without esophagitis: Secondary | ICD-10-CM | POA: Diagnosis not present

## 2019-07-06 DIAGNOSIS — Z86718 Personal history of other venous thrombosis and embolism: Secondary | ICD-10-CM | POA: Diagnosis not present

## 2019-07-06 DIAGNOSIS — E663 Overweight: Secondary | ICD-10-CM | POA: Diagnosis not present

## 2019-07-06 NOTE — Telephone Encounter (Signed)
RE: Dme- walker Received: Today Message Contents  Judithann Sauger, [place], [place]; Skeet Latch; [place], [place]; [place], Orvis Brill, RN   Will do. We will retry to reach out and schedule delivery.       Previous Messages   ----- Message -----  From: Judge Stall, [place]  Sent: 07/06/2019  8:55 AM EDT  To: Darlina Guys, Skeet Latch, [place], NT, *  Subject: Dme- walker                    Good morning I just talked to [facility] where this patient lives. I spoke to the facility and the patient needs the walker.There phone number is [phone] and there address is [place] code is [phone].Would you be able to recheck on this [place], Gwinda [place] C6/3/20219:03 AM

## 2019-07-11 DIAGNOSIS — Z86718 Personal history of other venous thrombosis and embolism: Secondary | ICD-10-CM | POA: Diagnosis not present

## 2019-07-11 DIAGNOSIS — G309 Alzheimer's disease, unspecified: Secondary | ICD-10-CM | POA: Diagnosis not present

## 2019-07-11 DIAGNOSIS — M170 Bilateral primary osteoarthritis of knee: Secondary | ICD-10-CM | POA: Diagnosis not present

## 2019-07-11 DIAGNOSIS — E785 Hyperlipidemia, unspecified: Secondary | ICD-10-CM | POA: Diagnosis not present

## 2019-07-11 DIAGNOSIS — I959 Hypotension, unspecified: Secondary | ICD-10-CM | POA: Diagnosis not present

## 2019-07-11 DIAGNOSIS — K219 Gastro-esophageal reflux disease without esophagitis: Secondary | ICD-10-CM | POA: Diagnosis not present

## 2019-07-11 DIAGNOSIS — E663 Overweight: Secondary | ICD-10-CM | POA: Diagnosis not present

## 2019-07-11 DIAGNOSIS — F0280 Dementia in other diseases classified elsewhere without behavioral disturbance: Secondary | ICD-10-CM | POA: Diagnosis not present

## 2019-07-11 DIAGNOSIS — F411 Generalized anxiety disorder: Secondary | ICD-10-CM | POA: Diagnosis not present

## 2019-07-13 DIAGNOSIS — Z86718 Personal history of other venous thrombosis and embolism: Secondary | ICD-10-CM | POA: Diagnosis not present

## 2019-07-13 DIAGNOSIS — F411 Generalized anxiety disorder: Secondary | ICD-10-CM | POA: Diagnosis not present

## 2019-07-13 DIAGNOSIS — E663 Overweight: Secondary | ICD-10-CM | POA: Diagnosis not present

## 2019-07-13 DIAGNOSIS — M170 Bilateral primary osteoarthritis of knee: Secondary | ICD-10-CM | POA: Diagnosis not present

## 2019-07-13 DIAGNOSIS — F0280 Dementia in other diseases classified elsewhere without behavioral disturbance: Secondary | ICD-10-CM | POA: Diagnosis not present

## 2019-07-13 DIAGNOSIS — K219 Gastro-esophageal reflux disease without esophagitis: Secondary | ICD-10-CM | POA: Diagnosis not present

## 2019-07-13 DIAGNOSIS — E785 Hyperlipidemia, unspecified: Secondary | ICD-10-CM | POA: Diagnosis not present

## 2019-07-13 DIAGNOSIS — I959 Hypotension, unspecified: Secondary | ICD-10-CM | POA: Diagnosis not present

## 2019-07-13 DIAGNOSIS — G309 Alzheimer's disease, unspecified: Secondary | ICD-10-CM | POA: Diagnosis not present

## 2019-07-14 ENCOUNTER — Telehealth: Payer: Self-pay | Admitting: *Deleted

## 2019-07-14 NOTE — Telephone Encounter (Signed)
[place] with hospice would like to start norco 5/325 1 tablet  every 12 hrs. Not PRN.  Please write a d'c order for tylenol arthritis 650mg . They will keep the morphine if needed determined by the hospice nurse. Please send this asap [place] [facility] [phone]

## 2019-07-17 MED ORDER — HYDROCODONE-ACETAMINOPHEN 5-325 MG PO TABS: 1.0000 | ORAL_TABLET | Freq: Two times a day (BID) | ORAL | 0 refills | Status: DC

## 2019-07-17 NOTE — Telephone Encounter (Signed)
Ok. Norco ordered. I sent it to [facility], same place that filled the morphine. We will need to get an updated MAR for the facility.

## 2019-07-18 DIAGNOSIS — I959 Hypotension, unspecified: Secondary | ICD-10-CM | POA: Diagnosis not present

## 2019-07-18 DIAGNOSIS — F0280 Dementia in other diseases classified elsewhere without behavioral disturbance: Secondary | ICD-10-CM | POA: Diagnosis not present

## 2019-07-18 DIAGNOSIS — E663 Overweight: Secondary | ICD-10-CM | POA: Diagnosis not present

## 2019-07-18 DIAGNOSIS — G309 Alzheimer's disease, unspecified: Secondary | ICD-10-CM | POA: Diagnosis not present

## 2019-07-18 DIAGNOSIS — K219 Gastro-esophageal reflux disease without esophagitis: Secondary | ICD-10-CM | POA: Diagnosis not present

## 2019-07-18 DIAGNOSIS — Z86718 Personal history of other venous thrombosis and embolism: Secondary | ICD-10-CM | POA: Diagnosis not present

## 2019-07-18 DIAGNOSIS — M170 Bilateral primary osteoarthritis of knee: Secondary | ICD-10-CM | POA: Diagnosis not present

## 2019-07-18 DIAGNOSIS — E785 Hyperlipidemia, unspecified: Secondary | ICD-10-CM | POA: Diagnosis not present

## 2019-07-18 DIAGNOSIS — F411 Generalized anxiety disorder: Secondary | ICD-10-CM | POA: Diagnosis not present

## 2019-07-19 ENCOUNTER — Telehealth: Payer: Self-pay | Admitting: *Deleted

## 2019-07-19 NOTE — Telephone Encounter (Signed)
[facility]ed [facility] to [facility] after speaking w/ hospice nurse

## 2019-07-20 DIAGNOSIS — Z86718 Personal history of other venous thrombosis and embolism: Secondary | ICD-10-CM | POA: Diagnosis not present

## 2019-07-20 DIAGNOSIS — G309 Alzheimer's disease, unspecified: Secondary | ICD-10-CM | POA: Diagnosis not present

## 2019-07-20 DIAGNOSIS — E663 Overweight: Secondary | ICD-10-CM | POA: Diagnosis not present

## 2019-07-20 DIAGNOSIS — F0280 Dementia in other diseases classified elsewhere without behavioral disturbance: Secondary | ICD-10-CM | POA: Diagnosis not present

## 2019-07-20 DIAGNOSIS — K219 Gastro-esophageal reflux disease without esophagitis: Secondary | ICD-10-CM | POA: Diagnosis not present

## 2019-07-20 DIAGNOSIS — E785 Hyperlipidemia, unspecified: Secondary | ICD-10-CM | POA: Diagnosis not present

## 2019-07-20 DIAGNOSIS — F411 Generalized anxiety disorder: Secondary | ICD-10-CM | POA: Diagnosis not present

## 2019-07-20 DIAGNOSIS — I959 Hypotension, unspecified: Secondary | ICD-10-CM | POA: Diagnosis not present

## 2019-07-20 DIAGNOSIS — M170 Bilateral primary osteoarthritis of knee: Secondary | ICD-10-CM | POA: Diagnosis not present

## 2019-07-21 DIAGNOSIS — I959 Hypotension, unspecified: Secondary | ICD-10-CM | POA: Diagnosis not present

## 2019-07-21 DIAGNOSIS — Z86718 Personal history of other venous thrombosis and embolism: Secondary | ICD-10-CM | POA: Diagnosis not present

## 2019-07-21 DIAGNOSIS — E663 Overweight: Secondary | ICD-10-CM | POA: Diagnosis not present

## 2019-07-21 DIAGNOSIS — F411 Generalized anxiety disorder: Secondary | ICD-10-CM | POA: Diagnosis not present

## 2019-07-21 DIAGNOSIS — E785 Hyperlipidemia, unspecified: Secondary | ICD-10-CM | POA: Diagnosis not present

## 2019-07-21 DIAGNOSIS — M170 Bilateral primary osteoarthritis of knee: Secondary | ICD-10-CM | POA: Diagnosis not present

## 2019-07-21 DIAGNOSIS — G309 Alzheimer's disease, unspecified: Secondary | ICD-10-CM | POA: Diagnosis not present

## 2019-07-21 DIAGNOSIS — K219 Gastro-esophageal reflux disease without esophagitis: Secondary | ICD-10-CM | POA: Diagnosis not present

## 2019-07-21 DIAGNOSIS — F0280 Dementia in other diseases classified elsewhere without behavioral disturbance: Secondary | ICD-10-CM | POA: Diagnosis not present

## 2019-07-25 DIAGNOSIS — F0280 Dementia in other diseases classified elsewhere without behavioral disturbance: Secondary | ICD-10-CM | POA: Diagnosis not present

## 2019-07-25 DIAGNOSIS — I959 Hypotension, unspecified: Secondary | ICD-10-CM | POA: Diagnosis not present

## 2019-07-25 DIAGNOSIS — F411 Generalized anxiety disorder: Secondary | ICD-10-CM | POA: Diagnosis not present

## 2019-07-25 DIAGNOSIS — G309 Alzheimer's disease, unspecified: Secondary | ICD-10-CM | POA: Diagnosis not present

## 2019-07-25 DIAGNOSIS — K219 Gastro-esophageal reflux disease without esophagitis: Secondary | ICD-10-CM | POA: Diagnosis not present

## 2019-07-25 DIAGNOSIS — Z86718 Personal history of other venous thrombosis and embolism: Secondary | ICD-10-CM | POA: Diagnosis not present

## 2019-07-25 DIAGNOSIS — M170 Bilateral primary osteoarthritis of knee: Secondary | ICD-10-CM | POA: Diagnosis not present

## 2019-07-25 DIAGNOSIS — E785 Hyperlipidemia, unspecified: Secondary | ICD-10-CM | POA: Diagnosis not present

## 2019-07-25 DIAGNOSIS — E663 Overweight: Secondary | ICD-10-CM | POA: Diagnosis not present

## 2019-07-28 ENCOUNTER — Inpatient Hospital Stay (HOSPITAL_COMMUNITY)
Admission: RE | Admit: 2019-07-28 | Discharge: 2019-08-02 | DRG: 522 | Disposition: A | Discharge status: Skilled Nursing Facility | Payer: Medicare Other | Source: Skilled Nursing Facility | Attending: Internal Medicine | Admitting: Internal Medicine

## 2019-07-28 DIAGNOSIS — R41841 Cognitive communication deficit: Secondary | ICD-10-CM | POA: Diagnosis not present

## 2019-07-28 DIAGNOSIS — Y92129 Unspecified place in nursing home as the place of occurrence of the external cause: Secondary | ICD-10-CM | POA: Diagnosis not present

## 2019-07-28 DIAGNOSIS — F0280 Dementia in other diseases classified elsewhere without behavioral disturbance: Secondary | ICD-10-CM | POA: Diagnosis present

## 2019-07-28 DIAGNOSIS — R2681 Unsteadiness on feet: Secondary | ICD-10-CM | POA: Diagnosis not present

## 2019-07-28 DIAGNOSIS — Z515 Encounter for palliative care: Secondary | ICD-10-CM | POA: Diagnosis present

## 2019-07-28 DIAGNOSIS — Z87891 Personal history of nicotine dependence: Secondary | ICD-10-CM

## 2019-07-28 DIAGNOSIS — Z20822 Contact with and (suspected) exposure to covid-19: Secondary | ICD-10-CM | POA: Diagnosis present

## 2019-07-28 DIAGNOSIS — I700 Atherosclerosis of aorta: Secondary | ICD-10-CM | POA: Diagnosis not present

## 2019-07-28 DIAGNOSIS — M2550 Pain in unspecified joint: Secondary | ICD-10-CM | POA: Diagnosis not present

## 2019-07-28 DIAGNOSIS — I70201 Unspecified atherosclerosis of native arteries of extremities, right leg: Secondary | ICD-10-CM | POA: Diagnosis present

## 2019-07-28 DIAGNOSIS — Z820 Family history of epilepsy and other diseases of the nervous system: Secondary | ICD-10-CM | POA: Diagnosis not present

## 2019-07-28 DIAGNOSIS — J320 Chronic maxillary sinusitis: Secondary | ICD-10-CM | POA: Diagnosis not present

## 2019-07-28 DIAGNOSIS — Z419 Encounter for procedure for purposes other than remedying health state, unspecified: Secondary | ICD-10-CM

## 2019-07-28 DIAGNOSIS — M25551 Pain in right hip: Secondary | ICD-10-CM

## 2019-07-28 DIAGNOSIS — Z66 Do not resuscitate: Secondary | ICD-10-CM | POA: Diagnosis not present

## 2019-07-28 DIAGNOSIS — Z9049 Acquired absence of other specified parts of digestive tract: Secondary | ICD-10-CM | POA: Diagnosis not present

## 2019-07-28 DIAGNOSIS — D62 Acute posthemorrhagic anemia: Secondary | ICD-10-CM | POA: Diagnosis not present

## 2019-07-28 DIAGNOSIS — Z86718 Personal history of other venous thrombosis and embolism: Secondary | ICD-10-CM

## 2019-07-28 DIAGNOSIS — Z7901 Long term (current) use of anticoagulants: Secondary | ICD-10-CM

## 2019-07-28 DIAGNOSIS — Z95820 Peripheral vascular angioplasty status with implants and grafts: Secondary | ICD-10-CM | POA: Diagnosis not present

## 2019-07-28 DIAGNOSIS — Z7401 Bed confinement status: Secondary | ICD-10-CM | POA: Diagnosis not present

## 2019-07-28 DIAGNOSIS — Z833 Family history of diabetes mellitus: Secondary | ICD-10-CM

## 2019-07-28 DIAGNOSIS — S199XXA Unspecified injury of neck, initial encounter: Secondary | ICD-10-CM | POA: Diagnosis not present

## 2019-07-28 DIAGNOSIS — M25561 Pain in right knee: Secondary | ICD-10-CM

## 2019-07-28 DIAGNOSIS — G309 Alzheimer's disease, unspecified: Secondary | ICD-10-CM | POA: Diagnosis present

## 2019-07-28 DIAGNOSIS — Z4789 Encounter for other orthopedic aftercare: Secondary | ICD-10-CM | POA: Diagnosis not present

## 2019-07-28 DIAGNOSIS — S72011A Unspecified intracapsular fracture of right femur, initial encounter for closed fracture: Secondary | ICD-10-CM | POA: Diagnosis present

## 2019-07-28 DIAGNOSIS — Z832 Family history of diseases of the blood and blood-forming organs and certain disorders involving the immune mechanism: Secondary | ICD-10-CM | POA: Diagnosis not present

## 2019-07-28 DIAGNOSIS — F29 Unspecified psychosis not due to a substance or known physiological condition: Secondary | ICD-10-CM | POA: Diagnosis not present

## 2019-07-28 DIAGNOSIS — Z03818 Encounter for observation for suspected exposure to other biological agents ruled out: Secondary | ICD-10-CM | POA: Diagnosis not present

## 2019-07-28 DIAGNOSIS — Z807 Family history of other malignant neoplasms of lymphoid, hematopoietic and related tissues: Secondary | ICD-10-CM

## 2019-07-28 DIAGNOSIS — R52 Pain, unspecified: Secondary | ICD-10-CM | POA: Diagnosis not present

## 2019-07-28 DIAGNOSIS — M6281 Muscle weakness (generalized): Secondary | ICD-10-CM | POA: Diagnosis not present

## 2019-07-28 DIAGNOSIS — R4182 Altered mental status, unspecified: Secondary | ICD-10-CM | POA: Diagnosis not present

## 2019-07-28 DIAGNOSIS — I10 Essential (primary) hypertension: Secondary | ICD-10-CM | POA: Diagnosis not present

## 2019-07-28 DIAGNOSIS — M47816 Spondylosis without myelopathy or radiculopathy, lumbar region: Secondary | ICD-10-CM | POA: Diagnosis not present

## 2019-07-28 DIAGNOSIS — Z8249 Family history of ischemic heart disease and other diseases of the circulatory system: Secondary | ICD-10-CM | POA: Diagnosis not present

## 2019-07-28 DIAGNOSIS — K219 Gastro-esophageal reflux disease without esophagitis: Secondary | ICD-10-CM | POA: Diagnosis present

## 2019-07-28 DIAGNOSIS — E785 Hyperlipidemia, unspecified: Secondary | ICD-10-CM | POA: Diagnosis not present

## 2019-07-28 DIAGNOSIS — D649 Anemia, unspecified: Secondary | ICD-10-CM | POA: Diagnosis not present

## 2019-07-28 DIAGNOSIS — Z9181 History of falling: Secondary | ICD-10-CM | POA: Diagnosis not present

## 2019-07-28 DIAGNOSIS — J3489 Other specified disorders of nose and nasal sinuses: Secondary | ICD-10-CM | POA: Diagnosis not present

## 2019-07-28 DIAGNOSIS — Z79899 Other long term (current) drug therapy: Secondary | ICD-10-CM | POA: Diagnosis not present

## 2019-07-28 DIAGNOSIS — M160 Bilateral primary osteoarthritis of hip: Secondary | ICD-10-CM | POA: Diagnosis not present

## 2019-07-28 DIAGNOSIS — Z8349 Family history of other endocrine, nutritional and metabolic diseases: Secondary | ICD-10-CM

## 2019-07-28 DIAGNOSIS — W19XXXA Unspecified fall, initial encounter: Secondary | ICD-10-CM | POA: Diagnosis present

## 2019-07-28 DIAGNOSIS — Z96641 Presence of right artificial hip joint: Secondary | ICD-10-CM | POA: Diagnosis not present

## 2019-07-28 DIAGNOSIS — M40209 Unspecified kyphosis, site unspecified: Secondary | ICD-10-CM | POA: Diagnosis not present

## 2019-07-28 DIAGNOSIS — Z86711 Personal history of pulmonary embolism: Secondary | ICD-10-CM | POA: Diagnosis not present

## 2019-07-28 DIAGNOSIS — R1312 Dysphagia, oropharyngeal phase: Secondary | ICD-10-CM | POA: Diagnosis not present

## 2019-07-28 DIAGNOSIS — T1490XA Injury, unspecified, initial encounter: Secondary | ICD-10-CM

## 2019-07-28 DIAGNOSIS — R2689 Other abnormalities of gait and mobility: Secondary | ICD-10-CM | POA: Diagnosis not present

## 2019-07-28 DIAGNOSIS — M79604 Pain in right leg: Secondary | ICD-10-CM | POA: Diagnosis not present

## 2019-07-28 DIAGNOSIS — R404 Transient alteration of awareness: Secondary | ICD-10-CM | POA: Diagnosis not present

## 2019-07-28 DIAGNOSIS — R5381 Other malaise: Secondary | ICD-10-CM | POA: Diagnosis not present

## 2019-07-28 DIAGNOSIS — Z471 Aftercare following joint replacement surgery: Secondary | ICD-10-CM | POA: Diagnosis not present

## 2019-07-28 DIAGNOSIS — D6859 Other primary thrombophilia: Secondary | ICD-10-CM

## 2019-07-28 DIAGNOSIS — M25461 Effusion, right knee: Secondary | ICD-10-CM | POA: Diagnosis not present

## 2019-07-28 DIAGNOSIS — T148XXA Other injury of unspecified body region, initial encounter: Secondary | ICD-10-CM | POA: Diagnosis present

## 2019-07-28 DIAGNOSIS — R278 Other lack of coordination: Secondary | ICD-10-CM | POA: Diagnosis not present

## 2019-07-28 DIAGNOSIS — S8991XA Unspecified injury of right lower leg, initial encounter: Secondary | ICD-10-CM | POA: Diagnosis not present

## 2019-07-28 DIAGNOSIS — M4317 Spondylolisthesis, lumbosacral region: Secondary | ICD-10-CM | POA: Diagnosis not present

## 2019-07-28 DIAGNOSIS — S299XXA Unspecified injury of thorax, initial encounter: Secondary | ICD-10-CM | POA: Diagnosis not present

## 2019-07-28 DIAGNOSIS — R262 Difficulty in walking, not elsewhere classified: Secondary | ICD-10-CM | POA: Diagnosis not present

## 2019-07-28 DIAGNOSIS — S3992XA Unspecified injury of lower back, initial encounter: Secondary | ICD-10-CM | POA: Diagnosis not present

## 2019-07-28 DIAGNOSIS — S72001A Fracture of unspecified part of neck of right femur, initial encounter for closed fracture: Secondary | ICD-10-CM | POA: Diagnosis not present

## 2019-07-28 DIAGNOSIS — S0990XA Unspecified injury of head, initial encounter: Secondary | ICD-10-CM | POA: Diagnosis not present

## 2019-07-29 ENCOUNTER — Emergency Department (HOSPITAL_COMMUNITY): Payer: Medicare Other

## 2019-07-29 ENCOUNTER — Inpatient Hospital Stay (HOSPITAL_COMMUNITY): Payer: Medicare Other

## 2019-07-29 ENCOUNTER — Other Ambulatory Visit: Payer: Self-pay

## 2019-07-29 ENCOUNTER — Encounter (HOSPITAL_COMMUNITY): Payer: Self-pay | Admitting: Emergency Medicine

## 2019-07-29 DIAGNOSIS — I70201 Unspecified atherosclerosis of native arteries of extremities, right leg: Secondary | ICD-10-CM | POA: Diagnosis present

## 2019-07-29 DIAGNOSIS — Z66 Do not resuscitate: Secondary | ICD-10-CM | POA: Diagnosis not present

## 2019-07-29 DIAGNOSIS — W19XXXA Unspecified fall, initial encounter: Secondary | ICD-10-CM

## 2019-07-29 DIAGNOSIS — Z833 Family history of diabetes mellitus: Secondary | ICD-10-CM | POA: Diagnosis not present

## 2019-07-29 DIAGNOSIS — Z8249 Family history of ischemic heart disease and other diseases of the circulatory system: Secondary | ICD-10-CM | POA: Diagnosis not present

## 2019-07-29 DIAGNOSIS — Z515 Encounter for palliative care: Secondary | ICD-10-CM | POA: Diagnosis present

## 2019-07-29 DIAGNOSIS — Y92129 Unspecified place in nursing home as the place of occurrence of the external cause: Secondary | ICD-10-CM | POA: Diagnosis not present

## 2019-07-29 DIAGNOSIS — G309 Alzheimer's disease, unspecified: Secondary | ICD-10-CM | POA: Diagnosis present

## 2019-07-29 DIAGNOSIS — Z807 Family history of other malignant neoplasms of lymphoid, hematopoietic and related tissues: Secondary | ICD-10-CM | POA: Diagnosis not present

## 2019-07-29 DIAGNOSIS — Z95820 Peripheral vascular angioplasty status with implants and grafts: Secondary | ICD-10-CM | POA: Diagnosis not present

## 2019-07-29 DIAGNOSIS — K219 Gastro-esophageal reflux disease without esophagitis: Secondary | ICD-10-CM | POA: Diagnosis present

## 2019-07-29 DIAGNOSIS — Z79899 Other long term (current) drug therapy: Secondary | ICD-10-CM | POA: Diagnosis not present

## 2019-07-29 DIAGNOSIS — T148XXA Other injury of unspecified body region, initial encounter: Secondary | ICD-10-CM | POA: Diagnosis present

## 2019-07-29 DIAGNOSIS — S72011A Unspecified intracapsular fracture of right femur, initial encounter for closed fracture: Secondary | ICD-10-CM | POA: Diagnosis present

## 2019-07-29 DIAGNOSIS — Z87891 Personal history of nicotine dependence: Secondary | ICD-10-CM | POA: Diagnosis not present

## 2019-07-29 DIAGNOSIS — Z20822 Contact with and (suspected) exposure to covid-19: Secondary | ICD-10-CM | POA: Diagnosis present

## 2019-07-29 DIAGNOSIS — Z820 Family history of epilepsy and other diseases of the nervous system: Secondary | ICD-10-CM | POA: Diagnosis not present

## 2019-07-29 DIAGNOSIS — Z7901 Long term (current) use of anticoagulants: Secondary | ICD-10-CM | POA: Diagnosis not present

## 2019-07-29 DIAGNOSIS — Z86711 Personal history of pulmonary embolism: Secondary | ICD-10-CM | POA: Diagnosis not present

## 2019-07-29 DIAGNOSIS — Z9049 Acquired absence of other specified parts of digestive tract: Secondary | ICD-10-CM | POA: Diagnosis not present

## 2019-07-29 DIAGNOSIS — M25551 Pain in right hip: Secondary | ICD-10-CM

## 2019-07-29 DIAGNOSIS — Z86718 Personal history of other venous thrombosis and embolism: Secondary | ICD-10-CM | POA: Diagnosis not present

## 2019-07-29 DIAGNOSIS — Z8349 Family history of other endocrine, nutritional and metabolic diseases: Secondary | ICD-10-CM | POA: Diagnosis not present

## 2019-07-29 DIAGNOSIS — F0280 Dementia in other diseases classified elsewhere without behavioral disturbance: Secondary | ICD-10-CM | POA: Diagnosis present

## 2019-07-29 DIAGNOSIS — Z832 Family history of diseases of the blood and blood-forming organs and certain disorders involving the immune mechanism: Secondary | ICD-10-CM | POA: Diagnosis not present

## 2019-07-29 DIAGNOSIS — D62 Acute posthemorrhagic anemia: Secondary | ICD-10-CM | POA: Diagnosis not present

## 2019-07-29 LAB — COMPREHENSIVE METABOLIC PANEL
ALT: 20 U/L (ref 0–44)
AST: 21 U/L (ref 15–41)
Albumin: 3.3 g/dL — ABNORMAL LOW (ref 3.5–5.0)
Alkaline Phosphatase: 150 U/L — ABNORMAL HIGH (ref 38–126)
Anion gap: 9 (ref 5–15)
BUN: 24 mg/dL — ABNORMAL HIGH (ref 8–23)
CO2: 24 mmol/L (ref 22–32)
Calcium: 9.1 mg/dL (ref 8.9–10.3)
Chloride: 104 mmol/L (ref 98–111)
Creatinine, Ser: 1.15 mg/dL (ref 0.61–1.24)
GFR calc Af Amer: >60 mL/min (ref >60)
GFR calc non Af Amer: >60 mL/min (ref >60)
Glucose, Bld: 145 mg/dL — ABNORMAL HIGH (ref 70–99)
Potassium: 3.8 mmol/L (ref 3.5–5.1)
Sodium: 137 mmol/L (ref 135–145)
Total Bilirubin: 0.6 mg/dL (ref 0.3–1.2)
Total Protein: 6.8 g/dL (ref 6.5–8.1)

## 2019-07-29 LAB — CBC WITH DIFFERENTIAL/PLATELET
Abs Immature Granulocytes: 0.06 10*3/uL (ref 0.00–0.07)
Basophils Absolute: 0 10*3/uL (ref 0.0–0.1)
Basophils Relative: 0 %
Eosinophils Absolute: 0.1 10*3/uL (ref 0.0–0.5)
Eosinophils Relative: 1 %
HCT: 37.1 % — ABNORMAL LOW (ref 39.0–52.0)
Hemoglobin: 11.8 g/dL — ABNORMAL LOW (ref 13.0–17.0)
Immature Granulocytes: 1 %
Lymphocytes Relative: 9 %
Lymphs Abs: 1.1 10*3/uL (ref 0.7–4.0)
MCH: 28.6 pg (ref 26.0–34.0)
MCHC: 31.8 g/dL (ref 30.0–36.0)
MCV: 89.8 fL (ref 80.0–100.0)
Monocytes Absolute: 0.9 10*3/uL (ref 0.1–1.0)
Monocytes Relative: 7 %
Neutro Abs: 10.5 10*3/uL — ABNORMAL HIGH (ref 1.7–7.7)
Neutrophils Relative %: 82 %
Platelets: 313 10*3/uL (ref 150–400)
RBC: 4.13 MIL/uL — ABNORMAL LOW (ref 4.22–5.81)
RDW: 15 % (ref 11.5–15.5)
WBC: 12.6 10*3/uL — ABNORMAL HIGH (ref 4.0–10.5)
nRBC: 0 % (ref 0.0–0.2)

## 2019-07-29 LAB — PROTIME-INR
INR: 1.5 — ABNORMAL HIGH (ref 0.8–1.2)
Prothrombin Time: 17.2 seconds — ABNORMAL HIGH (ref 11.4–15.2)

## 2019-07-29 LAB — TYPE AND SCREEN
ABO/RH(D): B NEG
Antibody Screen: NEGATIVE

## 2019-07-29 LAB — SURGICAL PCR SCREEN
MRSA, PCR: NEGATIVE
Staphylococcus aureus: NEGATIVE

## 2019-07-29 LAB — CK: Total CK: 124 U/L (ref 49–397)

## 2019-07-29 LAB — SARS CORONAVIRUS 2 BY RT PCR (HOSPITAL ORDER, PERFORMED IN ~~LOC~~ HOSPITAL LAB): SARS Coronavirus 2: NEGATIVE

## 2019-07-29 MED ORDER — FENTANYL CITRATE (PF) 100 MCG/2ML IJ SOLN
INTRAMUSCULAR | Status: AC
  Filled 2019-07-29: qty 2

## 2019-07-29 MED ORDER — RISPERIDONE 0.25 MG PO TABS
0.2500 mg | ORAL_TABLET | Freq: Every day | ORAL | Status: DC
  Administered 2019-07-29 – 2019-08-01 (×4): 0.25 mg via ORAL
  Filled 2019-07-29 (×5): qty 1

## 2019-07-29 MED ORDER — PANTOPRAZOLE SODIUM 40 MG PO TBEC
40.0000 mg | DELAYED_RELEASE_TABLET | Freq: Every day | ORAL | Status: DC
  Administered 2019-07-29 – 2019-08-02 (×5): 40 mg via ORAL
  Filled 2019-07-29 (×5): qty 1

## 2019-07-29 MED ORDER — POLYETHYLENE GLYCOL 3350 17 G PO PACK: 17.0000 g | PACK | Freq: Every day | ORAL | Status: DC | PRN

## 2019-07-29 MED ORDER — HYDROCODONE-ACETAMINOPHEN 5-325 MG PO TABS: 1.0000 | ORAL_TABLET | ORAL | Status: DC | PRN

## 2019-07-29 MED ORDER — CETAPHIL MOISTURIZING EX LOTN
TOPICAL_LOTION | Freq: Two times a day (BID) | CUTANEOUS | Status: DC
  Administered 2019-08-01: 1 via TOPICAL
  Filled 2019-07-29 (×2): qty 473

## 2019-07-29 MED ORDER — ENSURE ENLIVE PO LIQD
237.0000 mL | Freq: Two times a day (BID) | ORAL | Status: DC
  Administered 2019-07-29 – 2019-08-02 (×8): 237 mL via ORAL

## 2019-07-29 MED ORDER — DONEPEZIL HCL 10 MG PO TABS
10.0000 mg | ORAL_TABLET | Freq: Every day | ORAL | Status: DC
  Administered 2019-07-30 – 2019-08-01 (×3): 10 mg via ORAL
  Filled 2019-07-29 (×3): qty 1

## 2019-07-29 MED ORDER — FENTANYL CITRATE (PF) 100 MCG/2ML IJ SOLN
50.0000 ug | Freq: Once | INTRAMUSCULAR | Status: AC
  Administered 2019-07-29: 50 ug via INTRAVENOUS

## 2019-07-29 MED ORDER — CHLORHEXIDINE GLUCONATE 4 % EX LIQD
60.0000 mL | Freq: Once | CUTANEOUS | Status: AC
  Administered 2019-07-30: 4 via TOPICAL
  Filled 2019-07-29: qty 60

## 2019-07-29 MED ORDER — TRANEXAMIC ACID-NACL 1000-0.7 MG/100ML-% IV SOLN: 1000.0000 mg | INTRAVENOUS | Status: DC

## 2019-07-29 MED ORDER — ENSURE PRE-SURGERY PO LIQD
296.0000 mL | Freq: Once | ORAL | Status: DC
  Filled 2019-07-29: qty 296

## 2019-07-29 MED ORDER — RISPERIDONE 0.5 MG PO TABS
0.5000 mg | ORAL_TABLET | Freq: Once | ORAL | Status: DC
  Filled 2019-07-29: qty 1

## 2019-07-29 MED ORDER — HYDROCODONE-ACETAMINOPHEN 7.5-325 MG PO TABS
1.0000 | ORAL_TABLET | Freq: Two times a day (BID) | ORAL | Status: DC
  Administered 2019-07-29 (×2): 1 via ORAL
  Filled 2019-07-29 (×2): qty 1

## 2019-07-29 MED ORDER — ACETAMINOPHEN 325 MG PO TABS
650.0000 mg | ORAL_TABLET | Freq: Every day | ORAL | Status: DC
  Administered 2019-07-29 – 2019-08-01 (×4): 650 mg via ORAL
  Filled 2019-07-29 (×4): qty 2

## 2019-07-29 MED ORDER — DULOXETINE HCL 60 MG PO CPEP
60.0000 mg | ORAL_CAPSULE | Freq: Every day | ORAL | Status: DC
  Administered 2019-07-29 – 2019-08-02 (×5): 60 mg via ORAL
  Filled 2019-07-29 (×5): qty 1

## 2019-07-29 MED ORDER — FENTANYL CITRATE (PF) 100 MCG/2ML IJ SOLN
100.0000 ug | INTRAMUSCULAR | Status: DC | PRN
  Administered 2019-07-29: 100 ug via INTRAVENOUS
  Filled 2019-07-29: qty 2

## 2019-07-29 MED ORDER — POVIDONE-IODINE 10 % EX SWAB: 2.0000 "application " | Freq: Once | CUTANEOUS | Status: DC

## 2019-07-29 MED ORDER — SODIUM CHLORIDE 0.9 % IV SOLN: INTRAVENOUS | Status: DC

## 2019-07-29 MED ORDER — LACTATED RINGERS IV SOLN: INTRAVENOUS | Status: DC

## 2019-07-29 MED ORDER — CEFAZOLIN SODIUM-DEXTROSE 2-4 GM/100ML-% IV SOLN
2.0000 g | INTRAVENOUS | Status: AC
  Administered 2019-07-30: 2 g via INTRAVENOUS
  Filled 2019-07-29: qty 100

## 2019-07-29 MED ORDER — ADULT MULTIVITAMIN W/MINERALS CH
1.0000 | ORAL_TABLET | Freq: Every day | ORAL | Status: DC
  Administered 2019-07-29 – 2019-08-02 (×4): 1 via ORAL
  Filled 2019-07-29 (×5): qty 1

## 2019-07-29 MED ORDER — DONEPEZIL HCL 10 MG PO TABS
10.0000 mg | ORAL_TABLET | Freq: Every day | ORAL | Status: DC
  Filled 2019-07-29: qty 1

## 2019-07-29 MED ORDER — DONEPEZIL HCL 10 MG PO TABS: 10.0000 mg | ORAL_TABLET | Freq: Every day | ORAL | Status: DC

## 2019-07-29 NOTE — Anesthesia Preprocedure Evaluation (Addendum)
Anesthesia Evaluation  Patient identified by MRN, date of birth, ID band Patient awake and Patient confused    Reviewed: Allergy & Precautions, NPO status , Patient's Chart, lab work & pertinent test results  Airway Mallampati: II  TM Distance: >3 FB Neck ROM: Full    Dental  (+) Dental Advisory Given   Pulmonary former smoker,    breath sounds clear to auscultation       Cardiovascular + Peripheral Vascular Disease and + DVT   Rhythm:Regular Rate:Normal     Neuro/Psych negative neurological ROS     GI/Hepatic Neg liver ROS, GERD  ,  Endo/Other  negative endocrine ROS  Renal/GU negative Renal ROS     Musculoskeletal  (+) Arthritis ,   Abdominal   Peds  Hematology  (+) Blood dyscrasia (on xarelto. last dose 6/25), anemia ,   Anesthesia Other Findings   Reproductive/Obstetrics                             Lab Results  Component Value Date   WBC 12.6 (H) 07/29/2019   HGB 11.8 (L) 07/29/2019   HCT 37.1 (L) 07/29/2019   MCV 89.8 07/29/2019   PLT 313 07/29/2019   Lab Results  Component Value Date   CREATININE 1.15 07/29/2019   BUN 24 (H) 07/29/2019   NA 137 07/29/2019   K 3.8 07/29/2019   CL 104 07/29/2019   CO2 24 07/29/2019    Anesthesia Physical Anesthesia Plan  ASA: III  Anesthesia Plan: General   Post-op Pain Management:    Induction: Intravenous  PONV Risk Score and Plan: 2 and Dexamethasone, Ondansetron and Treatment may vary due to age or medical condition  Airway Management Planned: Oral ETT  Additional Equipment:   Intra-op Plan:   Post-operative Plan: Extubation in OR  Informed Consent: I have reviewed the patients History and Physical, chart, labs and discussed the procedure including the risks, benefits and alternatives for the proposed anesthesia with the patient or authorized representative who has indicated his/her understanding and acceptance.      Consent reviewed with POA  Plan Discussed with:   Anesthesia Plan Comments:        Anesthesia Quick Evaluation

## 2019-07-29 NOTE — H&P (View-Only) (Signed)
Reason for Consult:right hip fracture Referring Physician: EDP  [name] Jabar Krysiak. is an 69 y.o. male.  HPI: 69 yo male with advanced dementia who lives in a nursing home and fell this evening injuring his right hip and hitting his head.  He presented to the [facility] ED for eval and treatment unable to stand or bear weight with the right leg after the fall. Patient is a poor historian.  Past Medical History:  Diagnosis Date  . DVT (deep venous thrombosis) ([place]) 2010 and 10/2010   after Hernia repair in 2010 s/p IVC filter and coumadin. Next episode after Hernia repair  ( 10/23/2010) . Admitted on 11/02/2010 for repeat DVT. Warfarin was stopped for  5 days before surgery.   Marland Kitchen DVT (deep venous thrombosis) ([place]) 10/2011   right leg after knee surgery  . GERD (gastroesophageal reflux disease)   . Incisional hernia 08/20/2010  . Pulmonary embolism ([place]) 2010    Past Surgical History:  Procedure Laterality Date  . APPENDECTOMY  age 60  . FEMORAL ARTERY STENT Right 2010  . HERNIA REPAIR  2010  . HERNIA REPAIR  10/23/2010   Incisional hernia w/mesh  . HERNIA REPAIR    . INCISIONAL HERNIA REPAIR N/A 09/22/2012   Procedure: INICISIONAL HERNIA AND BILATERAL RECURRENT INGUINAL HERNIA  REPAIRED [place] ;  Surgeon: Adin Hector, MD;  Location: [facility];  Service: General;  Laterality: N/A;  . INGUINAL HERNIA REPAIR Bilateral 09/22/2012   Procedure: LAPAROSCOPIC BILATERAL INGUINAL HERNIA REPAIR;  Surgeon: Adin Hector, MD;  Location: [facility];  Service: General;  Laterality: Bilateral;  RECURRENT INGUINAL HERNIAS BILATERAL   . INSERTION OF MESH Bilateral 09/22/2012   Procedure: INSERTION OF MESH;  Surgeon: Adin Hector, MD;  Location: [facility];  Service: General;  Laterality: Bilateral;  FOR REPAIR OF BILATERAL INGUINAL HERNIAS AND ALSO MESH INSERTED FOR VENTRAL HERNIA REPAIR   . KNEE SURGERY Right 10/2011   Noemi Chapel  . LAPAROSCOPIC LYSIS OF ADHESIONS N/A 09/22/2012   Procedure: LAPAROSCOPIC LYSIS OF  ADHESIONS 90 minutes ;  Surgeon: Adin Hector, MD;  Location: [facility];  Service: General;  Laterality: N/A;  . septic abdominal surgery  2010  . TONSILLECTOMY  as child    Family History  Problem Relation Age of Onset  . Cancer Father        Lymphoma   . Raynaud syndrome Father   . Diabetes Mother   . Parkinsonism Mother   . Heart disease Mother   . Schizophrenia Mother   . Thyroid disease Sister   . Diabetes Sister   . Raynaud syndrome Sister   . Lupus Sister   . Raynaud syndrome Sister   . Heart attack Other        neice  . Colon cancer Neg Hx     Social History:  reports that he quit smoking about 33 years ago. His smoking use included cigarettes. He quit after 2.00 years of use. He has never used smokeless tobacco. He reports that he does not drink alcohol and does not use drugs.  Allergies: No Known Allergies  Medications: I have reviewed the patient's current medications.  Results for orders placed or performed during the hospital encounter of 07/28/19 (from the past 48 hour(s))  CBC WITH DIFFERENTIAL     Status: Abnormal   Collection Time: 07/29/19 12:05 AM  Result Value Ref Range   WBC 12.6 (H) 4.0 - 10.5 K/uL   RBC 4.13 (L) 4.22 - 5.81 MIL/uL  Hemoglobin 11.8 (L) 13.0 - 17.0 g/dL   HCT 37.1 (L) 39 - 52 %   MCV 89.8 80.0 - 100.0 fL   MCH 28.6 26.0 - 34.0 pg   MCHC 31.8 30.0 - 36.0 g/dL   RDW 15.0 11.5 - 15.5 %   Platelets 313 150 - 400 K/uL   nRBC 0.0 0.0 - 0.2 %   Neutrophils Relative % 82 %   Neutro Abs 10.5 (H) 1.7 - 7.7 K/uL   Lymphocytes Relative 9 %   Lymphs Abs 1.1 0.7 - 4.0 K/uL   Monocytes Relative 7 %   Monocytes Absolute 0.9 0 - 1 K/uL   Eosinophils Relative 1 %   Eosinophils Absolute 0.1 0 - 0 K/uL   Basophils Relative 0 %   Basophils Absolute 0.0 0 - 0 K/uL   Immature Granulocytes 1 %   Abs Immature Granulocytes 0.06 0.00 - 0.07 K/uL    Comment: Performed at [facility] [address] [place], [place] [address]  Protime-INR      Status: Abnormal   Collection Time: 07/29/19 12:05 AM  Result Value Ref Range   Prothrombin Time 17.2 (H) 11.4 - 15.2 seconds   INR 1.5 (H) 0.8 - 1.2    Comment: (NOTE) INR goal varies based on device and disease states. Performed at [facility], [place] [address] [place], [place] [address]   Type and screen [place]     Status: None (Preliminary result)   Collection Time: 07/29/19 12:14 AM  Result Value Ref Range   ABO/RH(D) PENDING    Antibody Screen PENDING    Sample Expiration      08/01/2019,2359 Performed at [facility], [place] [address], [place], [place] [address]     CT Cervical Spine Wo Contrast  Result Date: 07/29/2019 CLINICAL DATA:  Un witnessed fall, anticoagulated EXAM: CT CERVICAL SPINE WITHOUT CONTRAST TECHNIQUE: Multidetector CT imaging of the cervical spine was performed without intravenous contrast. Multiplanar CT image reconstructions were also generated. COMPARISON:  01/17/2019 FINDINGS: Alignment: Alignment is anatomic and stable. Skull base and vertebrae: No acute displaced fractures. Soft tissues and spinal canal: No prevertebral fluid or swelling. No visible canal hematoma. Disc levels: Stable lower cervical spondylosis most pronounced at C5-6 and C6-7. There is symmetrical neural foraminal encroachment at these levels. There is prominent facet hypertrophy throughout the cervical spine, slightly asymmetric to the right. This is most pronounced from C2 through C5. This results and right predominant neural foraminal narrowing at C3-4 and C4-5. Upper chest: Airway is patent.  Lung apices are clear. Other: Reconstructed images demonstrate no additional findings. IMPRESSION: 1. Extensive multilevel spondylosis and facet hypertrophy, stable. 2. No acute cervical spine fractures. Electronically Signed   By: Randa Ngo M.D.   On: 07/29/2019 00:49   DG Pelvis Portable  Result Date: 07/29/2019 CLINICAL DATA:  [place], anticoagulated EXAM:  PORTABLE PELVIS 1-2 VIEWS COMPARISON:  01/13/2009 FINDINGS: Supine frontal view of the pelvis demonstrates and impacted subcapital right femoral neck fracture with varus angulation. No dislocation. No other acute displaced fractures. Symmetrical bilateral hip osteoarthritis. Stable lower lumbar spondylosis. 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 This is a stable finding. IMPRESSION: 1. Acute subcapital right femoral neck fracture with impaction and varus angulation. Electronically Signed   By: Randa Ngo M.D.   On: 07/29/2019 00:13   DG Chest Portable 1 View  Result Date: 07/29/2019 CLINICAL DATA:  Fell, anticoagulated EXAM: PORTABLE CHEST 1 VIEW COMPARISON:  06/18/2019 FINDINGS: Single frontal view of the chest demonstrates an unremarkable cardiac silhouette. No airspace disease, effusion, or pneumothorax. No acute displaced fractures. IMPRESSION: 1. No acute intrathoracic process. Electronically Signed   By: Randa Ngo M.D.   On: 07/29/2019 00:12    Review of Systems Blood pressure 137/87, pulse 65, temperature 99 F (37.2 C), temperature source Oral, resp. rate 13, height 5\' 7"  (1.702 m), weight 76 kg, SpO2 94 %. Physical Exam Awake and responds to commands but does not answer other questions.  Neck immobilized in c collar, chest non tender to compression T spine with mild midline tenderness but no step offs and no crepitance. L spine also tender in the midline. Abdomen soft and scaphoid Right LE shortened and externally rotated. Good distal perfusion Left LE pain free PROM, stiffness noted in the left hip. Normal distal pulses  Assessment/Plan: Right displaced femoral neck fracture in patient on Xarelto.   Admission and medical optimization per medicine.  Ortho following with possible right hip arthroplasty in next several days.  Will discuss with Dr Lyla Glassing in the morning.   Augustin Schooling 07/29/2019, 12:59 AM

## 2019-07-29 NOTE — Progress Notes (Signed)
   Subjective:   Patient resting in bed. He is alert but minimally responding to questions, answering in 1 word phrases. He denies pain, shortness of breath or dizziness and admits to understanding of the plan. He appears anxious but in no apparent distress.  Spoke with patient's facility. The night team had signed out and didn't state how long he had been down when found. They do put the patients in at night, though so he was at least seen at that time around 7pm. They usually check on the patients every two hours. Per [facility], he no longer talks very much.    Also spoke to sister and patient has a history of not saying when he was in pain, especially when his right knee hurts. He usually shakes his leg when his knee is in pain.   Objective:  Vital signs in last 24 hours: Vitals:   07/29/19 0342 07/29/19 0343 07/29/19 0400 07/29/19 0439  BP:   (!) 113/56 133/78  Pulse: 81 70 82 66  Resp:    17  Temp:    98.4 F (36.9 C)  TempSrc:    Oral  SpO2: 98% 99% 98% 100%  Weight:      Height:        Constitution: NAD, supine in bed HENT: Jordan Long/AT Eyes: no icterus or injection  Cardio: RRR, no m/r/g  Abdominal: NTTP, soft, non-distended, +BS MSK: moving all extremities, pain with moving RLE Neuro: responds intermittently to yes or not questions, following commands Skin: c/d/i   Assessment/Plan:  Active Problems:   Fracture  69yo male with severe dementia, 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   Right Displaced Femoral Neck Fracture 2/2 Fall Xarelto held for right hip arthroplasty, which is planned for 7am tomorrow. Patient denies pain currently. He does flinch with movement of the right leg. Per sister he will not say when he is in pain and has a history of this even prior to his dementia. He tends to shake his right leg when his knee is hurting which he was doing during rounds today.    - SCDs - cont. To hold xarelto.  -  NPO @midnight   - schedule hydrocodone 7.5-325 mg (gets 5-325 q12h scheduled at his facility) and tylenol  - fentanyl prn severe pain   Alzheimer's Dementia Discussed with patient's sister Jordan Long and his nurse at [facility]. Current baseline is minimal communication, which is consistent with how he was today. Discussed with his sister some of his anxious symptoms seem like they may be secondary to his pain.   - continue donepezil, cymbalta, risperidone - it appears PCP was discontinuing risperidone at last visit. Will taper after surgery and discontinue at discharge.    Dispo: Anticipated discharge pending medical improvement.   Jordan Long A, DO 07/29/2019, 12:23 PM Pager: [phone] After 5pm on weekdays and 1pm on weekends: On Call pager [phone]

## 2019-07-29 NOTE — ED Provider Notes (Signed)
[facility] EMERGENCY DEPARTMENT Provider Note   CSN: [id number] Arrival date & time: 07/28/19  2354     History Chief Complaint  Patient presents with  . Fall  . Hip Pain    Jordan Long. is a 69 y.o. male.   Fall This is a new problem. The current episode started 1 to 2 hours ago. The problem occurs constantly. The problem has not changed since onset.Pertinent negatives include no chest pain, no abdominal pain, no headaches and no shortness of breath. Associated symptoms comments: Right hip pain and difficulty bearing weight. Nothing aggravates the symptoms. Nothing relieves the symptoms. Jordan Long has tried nothing for the symptoms. The treatment provided no relief.       Past Medical History:  Diagnosis Date  . DVT (deep venous thrombosis) ([place]) 2010 and 10/2010   after Hernia repair in 2010 s/p IVC filter and coumadin. Next episode after Hernia repair  ( 10/23/2010) . Admitted on 11/02/2010 for repeat DVT. Warfarin was stopped for  5 days before surgery.   Marland Kitchen DVT (deep venous thrombosis) ([place]) 10/2011   right leg after knee surgery  . GERD (gastroesophageal reflux disease)   . Incisional hernia 08/20/2010  . Pulmonary embolism ([place]) 2010    Patient Active Problem List   Diagnosis Date Noted  . Physical deconditioning 04/21/2019  . Basal cell carcinoma (BCC) 12/27/2017  . Hypercoagulable state ([place]) 01/20/2016  . Chronic venous insufficiency 06/04/2015  . Alzheimer's dementia ([place]) 12/25/2013  . Dyslipidemia 06/13/2013  . Preventative health care 12/09/2012  . Osteoarthritis of knees, bilateral 07/13/2011  . GERD (gastroesophageal reflux disease) 07/13/2011  . Generalized anxiety disorder 03/02/2011    Past Surgical History:  Procedure Laterality Date  . APPENDECTOMY  age 13  . FEMORAL ARTERY STENT Right 2010  . HERNIA REPAIR  2010  . HERNIA REPAIR  10/23/2010   Incisional hernia w/mesh  . HERNIA REPAIR    . INCISIONAL HERNIA REPAIR N/A  09/22/2012   Procedure: INICISIONAL HERNIA AND BILATERAL RECURRENT INGUINAL HERNIA  REPAIRED [place] ;  Surgeon: Adin Hector, MD;  Location: [facility];  Service: General;  Laterality: N/A;  . INGUINAL HERNIA REPAIR Bilateral 09/22/2012   Procedure: LAPAROSCOPIC BILATERAL INGUINAL HERNIA REPAIR;  Surgeon: Adin Hector, MD;  Location: [facility];  Service: General;  Laterality: Bilateral;  RECURRENT INGUINAL HERNIAS BILATERAL   . INSERTION OF MESH Bilateral 09/22/2012   Procedure: INSERTION OF MESH;  Surgeon: Adin Hector, MD;  Location[facility];  Service: General;  Laterality: Bilateral;  FOR REPAIR OF BILATERAL INGUINAL HERNIAS AND ALSO MESH INSERTED FOR VENTRAL HERNIA REPAIR   . KNEE SURGERY Right 10/2011   Noemi Chapel  . LAPAROSCOPIC LYSIS OF ADHESIONS N/A 09/22/2012   Procedure: LAPAROSCOPIC LYSIS OF ADHESIONS 90 minutes ;  Surgeon: Adin Hector, MD;  [facility];  Service: General;  Laterality: N/A;  . septic abdominal surgery  2010  . TONSILLECTOMY  as child       Family History  Problem Relation Age of Onset  . Cancer Father        Lymphoma   . Raynaud syndrome Father   . Diabetes Mother   . Parkinsonism Mother   . Heart disease Mother   . Schizophrenia Mother   . Thyroid disease Sister   . Diabetes Sister   . Raynaud syndrome Sister   . Lupus Sister   . Raynaud syndrome Sister   . Heart attack Other  neice  . Colon cancer Neg Hx     Social History   Tobacco Use  . Smoking status: Former Smoker    Years: 2.00    Types: Cigarettes    Quit date: 09/23/1985    Years since quitting: 33.8  . Smokeless tobacco: Never Used  Substance Use Topics  . Alcohol use: No    Alcohol/week: 0.0 standard drinks  . Drug use: No    Home Medications Prior to Admission medications   Medication Sig Start Date End Date Taking? Authorizing Provider  donepezil (ARICEPT) 10 MG tablet Take 1 tablet (10 mg total) by mouth at bedtime. 12/10/17   Axel Filler, MD    DULoxetine (CYMBALTA) 60 MG capsule Take 1 capsule (60 mg total) by mouth daily. 12/10/17   Axel Filler, MD  Emollient [facility]) LOTN Apply two times per day as needed for itching and dry skin. Patient taking differently: Apply topically 2 (two) times daily.  08/18/18   Seawell, Jaimie A, DO  feeding supplement, ENSURE ENLIVE, (ENSURE ENLIVE) LIQD Take 237 mLs by mouth 2 (two) times daily between meals. 03/21/19   Axel Filler, MD  HYDROcodone-acetaminophen (NORCO) 5-325 MG tablet Take 1 tablet by mouth every 12 (twelve) hours. 07/17/19   Axel Filler, MD  morphine 10 MG/5ML solution Take 2.5 mLs (5 mg total) by mouth every 6 (six) hours as needed for severe pain. 06/22/19   Axel Filler, MD  Multiple Vitamins-Minerals (MULTIVITAMIN ADULT) CHEW Chew 1 tablet by mouth daily. 04/21/19   Neva Seat, MD  omeprazole (PRILOSEC) 20 MG capsule Take 1 capsule (20 mg total) by mouth daily. 12/10/17   Axel Filler, MD  polyethylene glycol (MIRALAX / GLYCOLAX) 17 g packet Take 17 g by mouth daily as needed for moderate constipation or severe constipation. 01/26/19   Annita Brod, MD  risperiDONE (RISPERDAL) 0.25 MG tablet Take 0.25 mg by mouth at bedtime. 06/17/19   [provider]  XARELTO 20 MG TABS tablet Take 1 tablet (20 mg total) by mouth daily. 12/10/17   Axel Filler, MD    Allergies    Patient has no known allergies.  Review of Systems   Review of Systems  Unable to perform ROS: Dementia  Respiratory: Negative for shortness of breath.   Cardiovascular: Negative for chest pain.  Gastrointestinal: Negative for abdominal pain.  Neurological: Negative for headaches.    Physical Exam Updated Vital Signs BP 140/80   Temp 99 F (37.2 C) (Oral)   Resp 18   Ht 5\' 7"  (1.702 m)   Wt 76 kg   SpO2 97%   BMI 26.24 kg/m   Physical Exam Vitals and nursing note reviewed.  Constitutional:      Appearance: Jordan Long is  well-developed.  HENT:     Head: Normocephalic and atraumatic.     Mouth/Throat:     Mouth: Mucous membranes are dry.     Pharynx: Oropharynx is clear.  Eyes:     Pupils: Pupils are equal, round, and reactive to light.  Cardiovascular:     Rate and Rhythm: Normal rate.  Pulmonary:     Effort: Pulmonary effort is normal. No respiratory distress.  Abdominal:     General: There is no distension.  Musculoskeletal:        General: Tenderness (right hip), deformity and signs of injury present.     Cervical back: Normal range of motion.  Skin:    General: Skin is warm and dry.  Coloration: Skin is not jaundiced.  Neurological:     General: No focal deficit present.     Mental Status: Jordan Long is alert.     Cranial Nerves: No cranial nerve deficit.     ED Results / Procedures / Treatments   Labs (all labs ordered are listed, but only abnormal results are displayed) Labs Reviewed  CBC WITH DIFFERENTIAL/PLATELET  PROTIME-INR  COMPREHENSIVE METABOLIC PANEL  TYPE AND SCREEN    EKG None  Radiology No results found.  Procedures Procedures (including critical care time)  Medications Ordered in ED Medications  fentaNYL (SUBLIMAZE) injection 50 mcg (has no administration in time range)    ED Course  I have reviewed the triage vital signs and the nursing notes.  Pertinent labs & imaging results that were available during my care of the patient were reviewed by me and considered in my medical decision making (see chart for details).  Clinical Course as of Jul 30 806  Sat Jul 29, 2019  0009 R femoral neck fx  DG Pelvis Portable [JM]    Clinical Course User Index [JM] Melbourne Jakubiak, Corene Cornea, MD   MDM Rules/Calculators/A&P                          Hip fracture from ground level fall. Also hit head on xarelto. D/w w/ Dr. Veverly Fells, will plan for surgery a couple days after xarelto stopped. Admit to medicine.   Final Clinical Impression(s) / ED Diagnoses Final diagnoses:  Trauma     Rx / DC Orders ED Discharge Orders    None       Ainsley Deakins, Corene Cornea, MD 07/30/19 [phone]

## 2019-07-29 NOTE — ED Triage Notes (Signed)
Pt brought to ED by [facility] from [facility] after unwitnessed fall, pt on Xarelto states he didn't it his head. c collar in place by [facility], some shortage on right leg noticed.

## 2019-07-29 NOTE — Progress Notes (Signed)
Orthopedic Tech Progress Note Patient Details:  Jordan Long [birth year] [id number] Level 2 Trauma  Patient ID: [name] [name]., male   DOB: [birth year], 69 y.o.   MRN: [id number]   [name] 07/29/2019, 12:22 AM

## 2019-07-29 NOTE — Consult Note (Signed)
Reason for Consult:right hip fracture Referring Physician: EDP  Jordan Long Asencion Loveday. is an 69 y.o. male.  HPI: 69 yo male with advanced dementia who lives in a nursing home and fell this evening injuring his right hip and hitting his head.  He presented to the [facility] ED for eval and treatment unable to stand or bear weight with the right leg after the fall. Patient is a poor historian.  Past Medical History:  Diagnosis Date  . DVT (deep venous thrombosis) ([place]) 2010 and 10/2010   after Hernia repair in 2010 s/p IVC filter and coumadin. Next episode after Hernia repair  ( 10/23/2010) . Admitted on 11/02/2010 for repeat DVT. Warfarin was stopped for  5 days before surgery.   Marland Kitchen DVT (deep venous thrombosis) ([place]) 10/2011   right leg after knee surgery  . GERD (gastroesophageal reflux disease)   . Incisional hernia 08/20/2010  . Pulmonary embolism ([place]) 2010    Past Surgical History:  Procedure Laterality Date  . APPENDECTOMY  age 78  . FEMORAL ARTERY STENT Right 2010  . HERNIA REPAIR  2010  . HERNIA REPAIR  10/23/2010   Incisional hernia w/mesh  . HERNIA REPAIR    . INCISIONAL HERNIA REPAIR N/A 09/22/2012   Procedure: INICISIONAL HERNIA AND BILATERAL RECURRENT INGUINAL HERNIA  REPAIRED [place] ;  Surgeon: Adin Hector, MD;  Location: [facility];  Service: General;  Laterality: N/A;  . INGUINAL HERNIA REPAIR Bilateral 09/22/2012   Procedure: LAPAROSCOPIC BILATERAL INGUINAL HERNIA REPAIR;  Surgeon: Adin Hector, MD;  Location: [facility];  Service: General;  Laterality: Bilateral;  RECURRENT INGUINAL HERNIAS BILATERAL   . INSERTION OF MESH Bilateral 09/22/2012   Procedure: INSERTION OF MESH;  Surgeon: Adin Hector, MD;  Location: [facility];  Service: General;  Laterality: Bilateral;  FOR REPAIR OF BILATERAL INGUINAL HERNIAS AND ALSO MESH INSERTED FOR VENTRAL HERNIA REPAIR   . KNEE SURGERY Right 10/2011   [facility]  . LAPAROSCOPIC LYSIS OF ADHESIONS N/A 09/22/2012   Procedure: LAPAROSCOPIC LYSIS OF  ADHESIONS 90 minutes ;  Surgeon: Adin Hector, MD;  Location: [facility];  Service: General;  Laterality: N/A;  . septic abdominal surgery  2010  . TONSILLECTOMY  as child    Family History  Problem Relation Age of Onset  . Cancer Father        Lymphoma   . Raynaud syndrome Father   . Diabetes Mother   . Parkinsonism Mother   . Heart disease Mother   . Schizophrenia Mother   . Thyroid disease Sister   . Diabetes Sister   . Raynaud syndrome Sister   . Lupus Sister   . Raynaud syndrome Sister   . Heart attack Other        neice  . Colon cancer Neg Hx     Social History:  reports that he quit smoking about 33 years ago. His smoking use included cigarettes. He quit after 2.00 years of use. He has never used smokeless tobacco. He reports that he does not drink alcohol and does not use drugs.  Allergies: No Known Allergies  Medications: I have reviewed the patient's current medications.  Results for orders placed or performed during the hospital encounter of 07/28/19 (from the past 48 hour(s))  CBC WITH DIFFERENTIAL     Status: Abnormal   Collection Time: 07/29/19 12:05 AM  Result Value Ref Range   WBC 12.6 (H) 4.0 - 10.5 K/uL   RBC 4.13 (L) 4.22 - 5.81 MIL/uL  Hemoglobin 11.8 (L) 13.0 - 17.0 g/dL   HCT 37.1 (L) 39 - 52 %   MCV 89.8 80.0 - 100.0 fL   MCH 28.6 26.0 - 34.0 pg   MCHC 31.8 30.0 - 36.0 g/dL   RDW 15.0 11.5 - 15.5 %   Platelets 313 150 - 400 K/uL   nRBC 0.0 0.0 - 0.2 %   Neutrophils Relative % 82 %   Neutro Abs 10.5 (H) 1.7 - 7.7 K/uL   Lymphocytes Relative 9 %   Lymphs Abs 1.1 0.7 - 4.0 K/uL   Monocytes Relative 7 %   Monocytes Absolute 0.9 0 - 1 K/uL   Eosinophils Relative 1 %   Eosinophils Absolute 0.1 0 - 0 K/uL   Basophils Relative 0 %   Basophils Absolute 0.0 0 - 0 K/uL   Immature Granulocytes 1 %   Abs Immature Granulocytes 0.06 0.00 - 0.07 K/uL    Comment: Performed at [facility] [address], [place], [place] [address]  Protime-INR      Status: Abnormal   Collection Time: 07/29/19 12:05 AM  Result Value Ref Range   Prothrombin Time 17.2 (H) 11.4 - 15.2 seconds   INR 1.5 (H) 0.8 - 1.2    Comment: (NOTE) INR goal varies based on device and disease states. Performed at [facility], [place] [address], [place], [place] [address]   Type and screen [place]     Status: None (Preliminary result)   Collection Time: 07/29/19 12:14 AM  Result Value Ref Range   ABO/RH(D) PENDING    Antibody Screen PENDING    Sample Expiration      08/01/2019,2359 Performed at [facility], [place] [address], [place], [place] [address]     CT Cervical Spine Wo Contrast  Result Date: 07/29/2019 CLINICAL DATA:  Un witnessed fall, anticoagulated EXAM: CT CERVICAL SPINE WITHOUT CONTRAST TECHNIQUE: Multidetector CT imaging of the cervical spine was performed without intravenous contrast. Multiplanar CT image reconstructions were also generated. COMPARISON:  01/17/2019 FINDINGS: Alignment: Alignment is anatomic and stable. Skull base and vertebrae: No acute displaced fractures. Soft tissues and spinal canal: No prevertebral fluid or swelling. No visible canal hematoma. Disc levels: Stable lower cervical spondylosis most pronounced at C5-6 and C6-7. There is symmetrical neural foraminal encroachment at these levels. There is prominent facet hypertrophy throughout the cervical spine, slightly asymmetric to the right. This is most pronounced from C2 through C5. This results and right predominant neural foraminal narrowing at C3-4 and C4-5. Upper chest: Airway is patent.  Lung apices are clear. Other: Reconstructed images demonstrate no additional findings. IMPRESSION: 1. Extensive multilevel spondylosis and facet hypertrophy, stable. 2. No acute cervical spine fractures. Electronically Signed   By: Randa Ngo M.D.   On: 07/29/2019 00:49   DG Pelvis Portable  Result Date: 07/29/2019 CLINICAL DATA:  [place], anticoagulated EXAM:  PORTABLE PELVIS 1-2 VIEWS COMPARISON:  01/13/2009 FINDINGS: Supine frontal view of the pelvis demonstrates and impacted subcapital right femoral neck fracture with varus angulation. No dislocation. No other acute displaced fractures. Symmetrical bilateral hip osteoarthritis. Stable lower lumbar spondylosis. 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 This is a stable finding. IMPRESSION: 1. Acute subcapital right femoral neck fracture with impaction and varus angulation. Electronically Signed   By: Randa Ngo M.D.   On: 07/29/2019 00:13   DG Chest Portable 1 View  Result Date: 07/29/2019 CLINICAL DATA:  [place]ell, anticoagulated EXAM: PORTABLE CHEST 1 VIEW COMPARISON:  06/18/2019 FINDINGS: Single frontal view of the chest demonstrates an unremarkable cardiac silhouette. No airspace disease, effusion, or pneumothorax. No acute displaced fractures. IMPRESSION: 1. No acute intrathoracic process. Electronically Signed   By: Randa Ngo M.D.   On: 07/29/2019 00:12    Review of Systems Blood pressure 137/87, pulse 65, temperature 99 F (37.2 C), temperature source Oral, resp. rate 13, height 5\' 7"  (1.702 m), weight 76 kg, SpO2 94 %. Physical Exam Awake and responds to commands but does not answer other questions.  Neck immobilized in c collar, chest non tender to compression T spine with mild midline tenderness but no step offs and no crepitance. L spine also tender in the midline. Abdomen soft and scaphoid Right LE shortened and externally rotated. Good distal perfusion Left LE pain free PROM, stiffness noted in the left hip. Normal distal pulses  Assessment/Plan: Right displaced femoral neck fracture in patient on Xarelto.   Admission and medical optimization per medicine.  Ortho following with possible right hip arthroplasty in next several days.  Will discuss with Dr Lyla Glassing in the morning.   Jordan Long 07/29/2019, 12:59 AM

## 2019-07-29 NOTE — Progress Notes (Signed)
Received a message from the surgeon Dr Lyla Glassing who stated the patient will be having surgery tomorrow 0730 on 07/30/2019 and to be NPO tonight.

## 2019-07-29 NOTE — Progress Notes (Addendum)
   Subjective:     Patient is currently in bed and and in no distress.  The patient is alert but can only answer questions in one word phrases.  He says yes to understanding of the plan to repair his hip in the near future.  Patient reports pain as mild.   Patient is admitted for a right displaced femoral neck fracture.   Objective: Vital signs in last 24 hours: Temp:  [98.3 F (36.8 C)-99 F (37.2 C)] 98.3 F (36.8 C) (06/26 0935) Pulse Rate:  [45-97] 66 (06/26 0935) Resp:  [0-20] 17 (06/26 0439) BP: (111-141)/(56-128) 117/78 (06/26 0935) SpO2:  [90 %-100 %] 97 % (06/26 0935) Weight:  [76 kg] 76 kg (06/26 0005)  Intake/Output from previous day:  Intake/Output Summary (Last 24 hours) at 07/29/2019 1011 Last data filed at 07/29/2019 0136 Gross per 24 hour  Intake 0 ml  Output 0 ml  Net 0 ml     Intake/Output this shift: No intake/output data recorded.  Labs: Recent Labs    07/29/19 0005  HGB 11.8*   Recent Labs    07/29/19 0005  WBC 12.6*  RBC 4.13*  HCT 37.1*  PLT 313   Recent Labs    07/29/19 0005  NA 137  K 3.8  CL 104  CO2 24  BUN 24*  CREATININE 1.15  GLUCOSE 145*  CALCIUM 9.1   Recent Labs    07/29/19 0005  INR 1.5*    Exam: General - Patient is pleasantly confused Extremity - Sensation intact distally Intact pulses distally No contusion or deformity noted to the right hip. Motor Function - intact, moving foot and toes well on exam.   Past Medical History:  Diagnosis Date  . DVT (deep venous thrombosis) ([place]) 2010 and 10/2010   after Hernia repair in 2010 s/p IVC filter and coumadin. Next episode after Hernia repair  ( 10/23/2010) . Admitted on 11/02/2010 for repeat DVT. Warfarin was stopped for  5 days before surgery.   Marland Kitchen DVT (deep venous thrombosis) ([place]) 10/2011   right leg after knee surgery  . GERD (gastroesophageal reflux disease)   . Incisional hernia 08/20/2010  . Pulmonary embolism ([place]) 2010    Assessment/Plan:       Assessment:Right displaced femoral neck fracture.  Plan: The patient last received Xarelto 20mg  Friday morning at 08:00. INR elevated at 1.5.  Hold anticoagulant today with anticipation of surgery possibly tomorrow. Plan for likely right hip hemiarthroplasty with Dr. Lyla Glassing.  NPO after midnight. NWB right lower extremity. Will obtain radiographs right knee.   CT of the spine revealed: 1. Age-indeterminate compression fracture of T12 with approximately 10% height loss and no retropulsion. Thoracic spine MRI may provide better temporal characterization. 2. Chronic T11 compression deformity. Will defer management to spine/neuro surgery.   Cherlynn June, PA-C Orthopedic Surgery 07/29/2019, 10:11 AM

## 2019-07-29 NOTE — ED Notes (Signed)
Report to rn on 5n

## 2019-07-29 NOTE — H&P (Addendum)
Date: 07/29/2019               Patient Name:  Jordan Long [facility]. MRN: [id number]  DOB: 1950/04/19 Age / Sex: 69 y.o., male   PCP: Jordan Filler, MD         Medical Service: Internal Medicine Teaching Service         Attending Physician: Dr. Velna Ochs, MD    First Contact: Dr. Gilford Long Pager: [phone]  Second Contact: Dr. Sharon Long Pager: [phone]       After Hours (After 5p/  First Contact Pager: [phone]  weekends / holidays): Second Contact Pager: [phone]   Chief Complaint: unwitnessed fall   History of Present Illness: Jordan Long is a 69 y/o gentleman with advanced dementia, hypercoagulable state on Xarelto who presents from his memory care unit after being found on the floor of another patient's room.  The patient was unable to give a personal account of the event secondary to his advanced dementia.  On chart review patient recently placed under hospice care and uses a rolling walker to ambulate. I spoke to staff at [facility] memory [facility]. He was in his normal state of health up to the event. He takes his medications crushed in apple sauce usually and needs help cutting up his food.   Meds:  No current facility-administered medications on file prior to encounter.   Current Outpatient Medications on File Prior to Encounter  Medication Sig Dispense Refill  . donepezil (ARICEPT) 10 MG tablet Take 1 tablet (10 mg total) by mouth at bedtime. 90 tablet 3  . DULoxetine (CYMBALTA) 60 MG capsule Take 1 capsule (60 mg total) by mouth daily. 90 capsule 3  . Emollient (CERAVE) LOTN Apply two times per day as needed for itching and dry skin. (Patient taking differently: Apply topically 2 (two) times daily. ) 355 mL 1  . feeding supplement, ENSURE ENLIVE, (ENSURE ENLIVE) LIQD Take 237 mLs by mouth 2 (two) times daily between meals. 237 mL 12  . HYDROcodone-acetaminophen (NORCO) 5-325 MG tablet Take 1 tablet by mouth every 12 (twelve) hours. 60 tablet 0  .  morphine 10 MG/5ML solution Take 2.5 mLs (5 mg total) by mouth every 6 (six) hours as needed for severe pain. 15 mL 0  . Multiple Vitamins-Minerals (MULTIVITAMIN ADULT) CHEW Chew 1 tablet by mouth daily. 30 tablet 11  . omeprazole (PRILOSEC) 20 MG capsule Take 1 capsule (20 mg total) by mouth daily. 90 capsule 3  . polyethylene glycol (MIRALAX / GLYCOLAX) 17 g packet Take 17 g by mouth daily as needed for moderate constipation or severe constipation. 14 each 0  . risperiDONE (RISPERDAL) 0.25 MG tablet Take 0.25 mg by mouth at bedtime.    Jordan Long 20 MG TABS tablet Take 1 tablet (20 mg total) by mouth daily. 90 tablet 3      Allergies: Allergies as of 07/28/2019  . (No Known Allergies)   Past Medical History:  Diagnosis Date  . DVT (deep venous thrombosis) ([place]) 2010 and 10/2010   after Hernia repair in 2010 s/p IVC filter and coumadin. Next episode after Hernia repair  ( 10/23/2010) . Admitted on 11/02/2010 for repeat DVT. Warfarin was stopped for  5 days before surgery.   Marland Kitchen DVT (deep venous thrombosis) ([place]) 10/2011   right leg after knee surgery  . GERD (gastroesophageal reflux disease)   . Incisional hernia 08/20/2010  . Pulmonary embolism ([place]) 2010    Family History:  Family History  Problem Relation Age of Onset  . Cancer Father        Lymphoma   . Raynaud syndrome Father   . Diabetes Mother   . Parkinsonism Mother   . Heart disease Mother   . Schizophrenia Mother   . Thyroid disease Sister   . Diabetes Sister   . Raynaud syndrome Sister   . Lupus Sister   . Raynaud syndrome Sister   . Heart attack Other        neice  . Colon cancer Neg Hx      Social History: lives in a memory care unit, uses a rolling walker and is able to get around fairly well. His sister Jordan Long lives here in [place]. His son Jordan Long is his healthcare power of attorney per staff member at memory care unit.    Review of Systems: Limited by patients baseline dementia. Endorsed right hip/leg pain.  Negative for other pain.   Physical Exam: Blood pressure 114/69, pulse 70, temperature 99 F (37.2 C), temperature source Oral, resp. rate 11, height 5\' 7"  (1.702 m), weight 76 kg, SpO2 99 %.   General: NAD, well developed HE: Normocephalic, atraumatic , EOMI, Conjunctivae normal ENT: No congestion, no rhinorrhea, no exudate or erythema  Cardiovascular: Normal rate, regular rhythm.  No murmurs, rubs, or gallops Pulmonary : Effort normal, breath sounds normal. No wheezes, rales, or rhonchi Abdominal: soft, nontender,  bowel sounds present Musculoskeletal: right hip tenderness and  Right LE externally rotated, distal pulses intact and extremity well perfused Skin: Warm, dry  Psychiatric/Behavioral: cooperative Neuro: Alert , attentive on exam.    EKG: personally reviewed my interpretation is sinus rythm  CXR: personally reviewed my interpretation is no consolidation , pleural effusions, pneumothorax or displaced fractures.  Assessment & Plan by Problem: Active Problems:   Fracture  [name] is a 69 y/o gentleman with advanced dementia, hypercoagulable state on Xarelto who presents with right displaced femoral neck fracture.    Right displaced femoral neck fracture Patient on Xarelto for hypercoagulable state with history of recurrent DVT and PE.  Ortho PDX surgery consulted in the emergency department and will plan for possible right hip arthroplasty in the next several days.   -Appreciate orthopedic surgery consult, f see recommendations -Holding Xarelto  -Hydrocodone for moderate pain -Fentanyl for severe pain  Alzheimer's dementia Patient is advanced stages of dementia and recently placed on hospice care. -Continue donepezil, Cymbalta,  risperidone  Dispo: Admit patient to Inpatient with expected length of stay greater than 2 midnights.  Signed: Madalyn Rob, MD 07/29/2019, 3:56 AM  After 5pm on weekdays and 1pm on weekends: On Call pager: [phone]

## 2019-07-29 NOTE — ED Notes (Signed)
Billee Cashing niece [phone] would like an update when available

## 2019-07-29 NOTE — Consult Note (Signed)
Responded to page, pt unavailable, no family present, staff will page again if further need of chaplain services.  Rev. Kyliyah Stirn Chaplain 

## 2019-07-29 NOTE — Progress Notes (Signed)
I spoke to [place], patient's sister. I could not reach Santiago Glad, but [name] will update other family members.

## 2019-07-29 NOTE — ED Notes (Signed)
Jordan Long, sister, [phone] would like an update when available

## 2019-07-29 NOTE — ED Notes (Signed)
Pt taken to CT with RN 

## 2019-07-30 ENCOUNTER — Inpatient Hospital Stay (HOSPITAL_COMMUNITY): Payer: Medicare Other | Admitting: Anesthesiology

## 2019-07-30 ENCOUNTER — Encounter (HOSPITAL_COMMUNITY)
Admission: RE | Disposition: A | Discharge status: Skilled Nursing Facility | Payer: Self-pay | Source: Skilled Nursing Facility | Attending: Internal Medicine

## 2019-07-30 ENCOUNTER — Inpatient Hospital Stay (HOSPITAL_COMMUNITY): Payer: Medicare Other

## 2019-07-30 HISTORY — PX: HIP ARTHROPLASTY: SHX981

## 2019-07-30 LAB — CBC WITH DIFFERENTIAL/PLATELET
Abs Immature Granulocytes: 0.03 10*3/uL (ref 0.00–0.07)
Basophils Absolute: 0 10*3/uL (ref 0.0–0.1)
Basophils Relative: 0 %
Eosinophils Absolute: 0.3 10*3/uL (ref 0.0–0.5)
Eosinophils Relative: 3 %
HCT: 34.3 % — ABNORMAL LOW (ref 39.0–52.0)
Hemoglobin: 10.7 g/dL — ABNORMAL LOW (ref 13.0–17.0)
Immature Granulocytes: 0 %
Lymphocytes Relative: 17 %
Lymphs Abs: 1.5 10*3/uL (ref 0.7–4.0)
MCH: 27.8 pg (ref 26.0–34.0)
MCHC: 31.2 g/dL (ref 30.0–36.0)
MCV: 89.1 fL (ref 80.0–100.0)
Monocytes Absolute: 0.8 10*3/uL (ref 0.1–1.0)
Monocytes Relative: 9 %
Neutro Abs: 6 10*3/uL (ref 1.7–7.7)
Neutrophils Relative %: 71 %
Platelets: 247 10*3/uL (ref 150–400)
RBC: 3.85 MIL/uL — ABNORMAL LOW (ref 4.22–5.81)
RDW: 15.4 % (ref 11.5–15.5)
WBC: 8.6 10*3/uL (ref 4.0–10.5)
nRBC: 0 % (ref 0.0–0.2)

## 2019-07-30 LAB — COMPREHENSIVE METABOLIC PANEL
ALT: 18 U/L (ref 0–44)
AST: 19 U/L (ref 15–41)
Albumin: 2.7 g/dL — ABNORMAL LOW (ref 3.5–5.0)
Alkaline Phosphatase: 118 U/L (ref 38–126)
Anion gap: 8 (ref 5–15)
BUN: 17 mg/dL (ref 8–23)
CO2: 25 mmol/L (ref 22–32)
Calcium: 8.8 mg/dL — ABNORMAL LOW (ref 8.9–10.3)
Chloride: 104 mmol/L (ref 98–111)
Creatinine, Ser: 0.99 mg/dL (ref 0.61–1.24)
GFR calc Af Amer: >60 mL/min (ref >60)
GFR calc non Af Amer: >60 mL/min (ref >60)
Glucose, Bld: 104 mg/dL — ABNORMAL HIGH (ref 70–99)
Potassium: 3.8 mmol/L (ref 3.5–5.1)
Sodium: 137 mmol/L (ref 135–145)
Total Bilirubin: 0.8 mg/dL (ref 0.3–1.2)
Total Protein: 6 g/dL — ABNORMAL LOW (ref 6.5–8.1)

## 2019-07-30 SURGERY — HEMIARTHROPLASTY, HIP, DIRECT ANTERIOR APPROACH, FOR FRACTURE
Anesthesia: General | Site: Hip | Laterality: Right

## 2019-07-30 MED ORDER — FENTANYL CITRATE (PF) 250 MCG/5ML IJ SOLN
INTRAMUSCULAR | Status: AC
  Filled 2019-07-30: qty 5

## 2019-07-30 MED ORDER — LACTATED RINGERS IV SOLN: INTRAVENOUS | Status: DC | PRN

## 2019-07-30 MED ORDER — ACETAMINOPHEN 325 MG PO TABS: 325.0000 mg | ORAL_TABLET | Freq: Four times a day (QID) | ORAL | Status: DC | PRN

## 2019-07-30 MED ORDER — AMISULPRIDE (ANTIEMETIC) 5 MG/2ML IV SOLN: 10.0000 mg | Freq: Once | INTRAVENOUS | Status: DC | PRN

## 2019-07-30 MED ORDER — METOCLOPRAMIDE HCL 5 MG/ML IJ SOLN: 5.0000 mg | Freq: Three times a day (TID) | INTRAMUSCULAR | Status: DC | PRN

## 2019-07-30 MED ORDER — FENTANYL CITRATE (PF) 250 MCG/5ML IJ SOLN
INTRAMUSCULAR | Status: DC | PRN
  Administered 2019-07-30: 50 ug via INTRAVENOUS
  Administered 2019-07-30: 25 ug via INTRAVENOUS
  Administered 2019-07-30 (×2): 50 ug via INTRAVENOUS

## 2019-07-30 MED ORDER — PHENYLEPHRINE HCL-NACL 10-0.9 MG/250ML-% IV SOLN
INTRAVENOUS | Status: DC | PRN
  Administered 2019-07-30: 25 ug/min via INTRAVENOUS

## 2019-07-30 MED ORDER — CHLORHEXIDINE GLUCONATE 0.12 % MT SOLN
OROMUCOSAL | Status: AC
  Filled 2019-07-30: qty 15

## 2019-07-30 MED ORDER — HYDROCODONE-ACETAMINOPHEN 7.5-325 MG PO TABS
1.0000 | ORAL_TABLET | Freq: Three times a day (TID) | ORAL | Status: DC
  Administered 2019-07-31 – 2019-08-01 (×4): 1 via ORAL
  Filled 2019-07-30 (×5): qty 1

## 2019-07-30 MED ORDER — CHLORHEXIDINE GLUCONATE CLOTH 2 % EX PADS
6.0000 | MEDICATED_PAD | Freq: Every day | CUTANEOUS | Status: DC
  Administered 2019-07-30 – 2019-08-02 (×4): 6 via TOPICAL

## 2019-07-30 MED ORDER — BUPIVACAINE-EPINEPHRINE 0.5% -1:200000 IJ SOLN
INTRAMUSCULAR | Status: AC
  Filled 2019-07-30: qty 1

## 2019-07-30 MED ORDER — LIDOCAINE 2% (20 MG/ML) 5 ML SYRINGE
INTRAMUSCULAR | Status: DC | PRN
  Administered 2019-07-30: 60 mg via INTRAVENOUS

## 2019-07-30 MED ORDER — KETOROLAC TROMETHAMINE 30 MG/ML IJ SOLN
INTRAMUSCULAR | Status: AC
  Filled 2019-07-30: qty 1

## 2019-07-30 MED ORDER — MIDAZOLAM HCL 2 MG/2ML IJ SOLN
INTRAMUSCULAR | Status: AC
  Filled 2019-07-30: qty 2

## 2019-07-30 MED ORDER — SODIUM CHLORIDE (PF) 0.9 % IJ SOLN
INTRAMUSCULAR | Status: DC | PRN
  Administered 2019-07-30: 30 mL

## 2019-07-30 MED ORDER — POLYETHYLENE GLYCOL 3350 17 G PO PACK
17.0000 g | PACK | Freq: Every day | ORAL | Status: DC
  Administered 2019-07-30 – 2019-08-02 (×3): 17 g via ORAL
  Filled 2019-07-30 (×4): qty 1

## 2019-07-30 MED ORDER — MENTHOL 3 MG MT LOZG: 1.0000 | LOZENGE | OROMUCOSAL | Status: DC | PRN

## 2019-07-30 MED ORDER — CEFAZOLIN SODIUM-DEXTROSE 2-4 GM/100ML-% IV SOLN
2.0000 g | Freq: Four times a day (QID) | INTRAVENOUS | Status: AC
  Administered 2019-07-30 (×2): 2 g via INTRAVENOUS
  Filled 2019-07-30 (×2): qty 100

## 2019-07-30 MED ORDER — ALBUMIN HUMAN 5 % IV SOLN
INTRAVENOUS | Status: DC | PRN
Start: 2019-07-30 — End: 2019-07-30

## 2019-07-30 MED ORDER — HYDROCODONE-ACETAMINOPHEN 7.5-325 MG PO TABS: 1.0000 | ORAL_TABLET | ORAL | Status: DC | PRN

## 2019-07-30 MED ORDER — SODIUM CHLORIDE 0.9 % IR SOLN
Status: DC | PRN
  Administered 2019-07-30: 3000 mL

## 2019-07-30 MED ORDER — ACETAMINOPHEN 10 MG/ML IV SOLN
INTRAVENOUS | Status: DC | PRN
  Administered 2019-07-30: 1000 mg via INTRAVENOUS

## 2019-07-30 MED ORDER — SENNA 8.6 MG PO TABS
1.0000 | ORAL_TABLET | Freq: Two times a day (BID) | ORAL | Status: DC
  Administered 2019-07-30 – 2019-08-02 (×7): 8.6 mg via ORAL
  Filled 2019-07-30 (×6): qty 1

## 2019-07-30 MED ORDER — DOCUSATE SODIUM 100 MG PO CAPS
100.0000 mg | ORAL_CAPSULE | Freq: Two times a day (BID) | ORAL | Status: DC
  Administered 2019-07-30 – 2019-08-02 (×7): 100 mg via ORAL
  Filled 2019-07-30 (×7): qty 1

## 2019-07-30 MED ORDER — 0.9 % SODIUM CHLORIDE (POUR BTL) OPTIME
TOPICAL | Status: DC | PRN
  Administered 2019-07-30: 1000 mL

## 2019-07-30 MED ORDER — EPHEDRINE SULFATE 50 MG/ML IJ SOLN
INTRAMUSCULAR | Status: DC | PRN
  Administered 2019-07-30: 10 mg via INTRAVENOUS

## 2019-07-30 MED ORDER — MORPHINE SULFATE (PF) 2 MG/ML IV SOLN: 0.5000 mg | INTRAVENOUS | Status: DC | PRN

## 2019-07-30 MED ORDER — HYDROCODONE-ACETAMINOPHEN 5-325 MG PO TABS
1.0000 | ORAL_TABLET | ORAL | Status: DC | PRN
  Administered 2019-07-30 (×2): 2 via ORAL
  Filled 2019-07-30 (×2): qty 2

## 2019-07-30 MED ORDER — ONDANSETRON HCL 4 MG/2ML IJ SOLN: 4.0000 mg | Freq: Four times a day (QID) | INTRAMUSCULAR | Status: DC | PRN

## 2019-07-30 MED ORDER — METOCLOPRAMIDE HCL 5 MG PO TABS: 5.0000 mg | ORAL_TABLET | Freq: Three times a day (TID) | ORAL | Status: DC | PRN

## 2019-07-30 MED ORDER — PROPOFOL 10 MG/ML IV BOLUS
INTRAVENOUS | Status: AC
  Filled 2019-07-30: qty 20

## 2019-07-30 MED ORDER — PHENOL 1.4 % MT LIQD: 1.0000 | OROMUCOSAL | Status: DC | PRN

## 2019-07-30 MED ORDER — KETOROLAC TROMETHAMINE 30 MG/ML IJ SOLN
INTRAMUSCULAR | Status: DC | PRN
  Administered 2019-07-30: 30 mg via INTRA_ARTICULAR

## 2019-07-30 MED ORDER — PHENYLEPHRINE HCL (PRESSORS) 10 MG/ML IV SOLN
INTRAVENOUS | Status: DC | PRN
  Administered 2019-07-30: 120 ug via INTRAVENOUS
  Administered 2019-07-30: 80 ug via INTRAVENOUS

## 2019-07-30 MED ORDER — BUPIVACAINE-EPINEPHRINE (PF) 0.5% -1:200000 IJ SOLN
INTRAMUSCULAR | Status: DC | PRN
  Administered 2019-07-30: 30 mL

## 2019-07-30 MED ORDER — ONDANSETRON HCL 4 MG/2ML IJ SOLN
INTRAMUSCULAR | Status: DC | PRN
  Administered 2019-07-30: 4 mg via INTRAVENOUS

## 2019-07-30 MED ORDER — DEXAMETHASONE SODIUM PHOSPHATE 10 MG/ML IJ SOLN
INTRAMUSCULAR | Status: DC | PRN
  Administered 2019-07-30: 10 mg via INTRAVENOUS

## 2019-07-30 MED ORDER — ONDANSETRON HCL 4 MG PO TABS: 4.0000 mg | ORAL_TABLET | Freq: Four times a day (QID) | ORAL | Status: DC | PRN

## 2019-07-30 MED ORDER — SUGAMMADEX SODIUM 200 MG/2ML IV SOLN
INTRAVENOUS | Status: DC | PRN
  Administered 2019-07-30: 200 mg via INTRAVENOUS

## 2019-07-30 MED ORDER — ACETAMINOPHEN 10 MG/ML IV SOLN
INTRAVENOUS | Status: AC
  Filled 2019-07-30: qty 100

## 2019-07-30 MED ORDER — FENTANYL CITRATE (PF) 100 MCG/2ML IJ SOLN: 25.0000 ug | INTRAMUSCULAR | Status: DC | PRN

## 2019-07-30 MED ORDER — ROCURONIUM BROMIDE 10 MG/ML (PF) SYRINGE
PREFILLED_SYRINGE | INTRAVENOUS | Status: DC | PRN
  Administered 2019-07-30: 30 mg via INTRAVENOUS
  Administered 2019-07-30: 50 mg via INTRAVENOUS

## 2019-07-30 MED ORDER — PROPOFOL 10 MG/ML IV BOLUS
INTRAVENOUS | Status: DC | PRN
  Administered 2019-07-30: 100 mg via INTRAVENOUS

## 2019-07-30 SURGICAL SUPPLY — 57 items
ADH SKN CLS APL DERMABOND .7 (GAUZE/BANDAGES/DRESSINGS) ×1
APL PRP STRL LF DISP 70% ISPRP (MISCELLANEOUS) ×1
BALL HIP DEPUY 50 (Hips) IMPLANT
BLADE CLIPPER SURG (BLADE) IMPLANT
BLADE SAW SGTL 18X1.27X75 (BLADE) ×2 IMPLANT
CHLORAPREP W/TINT 26 (MISCELLANEOUS) ×2 IMPLANT
COVER SURGICAL LIGHT HANDLE (MISCELLANEOUS) ×2 IMPLANT
DERMABOND ADVANCED (GAUZE/BANDAGES/DRESSINGS) ×1
DERMABOND ADVANCED .7 DNX12 (GAUZE/BANDAGES/DRESSINGS) ×2 IMPLANT
DRAPE C-ARM 42X72 X-RAY (DRAPES) ×2 IMPLANT
DRAPE IMP U-DRAPE 54X76 (DRAPES) ×4 IMPLANT
DRAPE STERI IOBAN 125X83 (DRAPES) ×2 IMPLANT
DRAPE U-SHAPE 47X51 STRL (DRAPES) ×6 IMPLANT
DRSG AQUACEL AG ADV 3.5X10 (GAUZE/BANDAGES/DRESSINGS) ×2 IMPLANT
ELECT BLADE 4.0 EZ CLEAN MEGAD (MISCELLANEOUS) ×2
ELECT REM PT RETURN 9FT ADLT (ELECTROSURGICAL) ×2
ELECTRODE BLDE 4.0 EZ CLN MEGD (MISCELLANEOUS) ×1 IMPLANT
ELECTRODE REM PT RTRN 9FT ADLT (ELECTROSURGICAL) ×1 IMPLANT
GLOVE BIO SURGEON STRL SZ8.5 (GLOVE) ×5 IMPLANT
GLOVE BIOGEL PI IND STRL 8.5 (GLOVE) ×1 IMPLANT
GLOVE BIOGEL PI INDICATOR 8.5 (GLOVE) ×2
GOWN STRL REUS W/ TWL LRG LVL3 (GOWN DISPOSABLE) ×2 IMPLANT
GOWN STRL REUS W/TWL 2XL LVL3 (GOWN DISPOSABLE) ×3 IMPLANT
GOWN STRL REUS W/TWL LRG LVL3 (GOWN DISPOSABLE) ×4
HANDPIECE INTERPULSE COAX TIP (DISPOSABLE) ×2
HIP BALL DEPUY 50 (Hips) ×2 IMPLANT
HOOD PEEL AWAY FACE SHEILD DIS (HOOD) ×6 IMPLANT
KIT BASIN OR (CUSTOM PROCEDURE TRAY) ×2 IMPLANT
KIT TURNOVER KIT B (KITS) ×2 IMPLANT
MANIFOLD NEPTUNE II (INSTRUMENTS) ×2 IMPLANT
MARKER SKIN DUAL TIP RULER LAB (MISCELLANEOUS) ×2 IMPLANT
NDL SPNL 18GX3.5 QUINCKE PK (NEEDLE) ×1 IMPLANT
NEEDLE SPNL 18GX3.5 QUINCKE PK (NEEDLE) ×2 IMPLANT
NS IRRIG 1000ML POUR BTL (IV SOLUTION) ×2 IMPLANT
PACK TOTAL JOINT (CUSTOM PROCEDURE TRAY) ×2 IMPLANT
PACK UNIVERSAL I (CUSTOM PROCEDURE TRAY) ×2 IMPLANT
PAD ARMBOARD 7.5X6 YLW CONV (MISCELLANEOUS) ×4 IMPLANT
SAW OSC TIP CART 19.5X105X1.3 (SAW) ×1 IMPLANT
SEALER BIPOLAR AQUA 6.0 (INSTRUMENTS) ×1 IMPLANT
SET HNDPC FAN SPRY TIP SCT (DISPOSABLE) ×1 IMPLANT
SPACER DEPUY (Hips) ×1 IMPLANT
STEM TRI LOC BPS SZ7 W GRIPTON (Hips) IMPLANT
SUCTION FRAZIER HANDLE 10FR (MISCELLANEOUS) ×2
SUCTION TUBE FRAZIER 10FR DISP (MISCELLANEOUS) ×1 IMPLANT
SUT ETHIBOND NAB CT1 #1 30IN (SUTURE) ×4 IMPLANT
SUT MNCRL AB 3-0 PS2 18 (SUTURE) ×2 IMPLANT
SUT MON AB 2-0 CT1 36 (SUTURE) ×3 IMPLANT
SUT VIC AB 1 CT1 27 (SUTURE) ×2
SUT VIC AB 1 CT1 27XBRD ANBCTR (SUTURE) ×1 IMPLANT
SUT VIC AB 2-0 CT1 27 (SUTURE) ×4
SUT VIC AB 2-0 CT1 TAPERPNT 27 (SUTURE) ×1 IMPLANT
SUT VLOC 180 0 24IN GS25 (SUTURE) ×2 IMPLANT
SYR 50ML LL SCALE MARK (SYRINGE) ×2 IMPLANT
TOWEL GREEN STERILE (TOWEL DISPOSABLE) ×2 IMPLANT
TOWEL GREEN STERILE FF (TOWEL DISPOSABLE) ×2 IMPLANT
TRI LOC BPS SZ 7 W GRIPTON (Hips) ×2 IMPLANT
WATER STERILE IRR 1000ML POUR (IV SOLUTION) ×6 IMPLANT

## 2019-07-30 NOTE — Anesthesia Procedure Notes (Signed)
Procedure Name: Intubation Date/Time: 07/30/2019 7:52 AM Performed by: Clearnce Sorrel, CRNA Pre-anesthesia Checklist: Patient identified, Emergency Drugs available, Suction available, Patient being monitored and Timeout performed Patient Re-evaluated:Patient Re-evaluated prior to induction Oxygen Delivery Method: Circle system utilized Preoxygenation: Pre-oxygenation with 100% oxygen Induction Type: IV induction Ventilation: Mask ventilation without difficulty Laryngoscope Size: Mac and 4 Grade View: Grade I Tube type: Oral Tube size: 7.5 mm Number of attempts: 1 Airway Equipment and Method: Stylet Placement Confirmation: ETT inserted through vocal cords under direct vision,  positive ETCO2 and breath sounds checked- equal and bilateral Secured at: 23 cm Tube secured with: Tape Dental Injury: Teeth and Oropharynx as per pre-operative assessment

## 2019-07-30 NOTE — Progress Notes (Signed)
Patient's sister stated that patient has upper partials that have been in for over a year and are unable to be removed.  She wanted MD to be aware since patient is going to surgery this morning at 0715.

## 2019-07-30 NOTE — Progress Notes (Signed)
Sister reports that patient has partial that has been in since before he went to dementia unit. Attempted oral care and patient bit down on swab. Patient did release swab. Unable to get patient to follow commands enough to view partial and attempt removal. [name], CRNA.

## 2019-07-30 NOTE — Op Note (Signed)
OPERATIVE REPORT  SURGEON: Rod Can, MD   ASSISTANT: Griffith Citron, PA-C.  PREOPERATIVE DIAGNOSIS: Displaced Right femoral neck fracture.   POSTOPERATIVE DIAGNOSIS: Displaced Right femoral neck fracture.   PROCEDURE: Right hip hemiarthroplasty, anterior approach.   IMPLANTS: DePuy Tri Lock stem, size 7, hi offset, with a -3 mm spacer and a 50 mm monopolar head ball.  ANESTHESIA:  General  ANTIBIOTICS: 2g ancef.  ESTIMATED BLOOD LOSS:-150 mL    DRAINS: None.  COMPLICATIONS: None   CONDITION: PACU - hemodynamically stable.   BRIEF CLINICAL NOTE: Jordan Long. is a 69 y.o. male with a displaced Right femoral neck fracture. He has a history of advanced dementia. He takes xarelto for hypercoagulable state. The patient was admitted to the hospitalist service and underwent perioperative risk stratification and medical optimization. The risks, benefits, and alternatives to hemiarthroplasty were explained, and the patient elected to proceed.  PROCEDURE IN DETAIL: The patient was taken to the operating room and general anesthesia was induced on the hospital bed.  The patient was then positioned on the [place] table.  All bony prominences were well padded.  The hip was prepped and draped in the normal sterile surgical fashion.  A time-out was called verifying side and site of surgery. Antibiotics were given within 60 minutes of beginning the procedure.   The direct anterior approach to the hip was performed through the Hueter interval.  Lateral femoral circumflex vessels were treated with the Auqumantys. The anterior capsule was exposed and an inverted T capsulotomy was made.  Fracture hematoma was encountered and evacuated. The patient was found to have a comminuted Right subcapital femoral neck fracture.  I freshened the femoral neck cut with a saw.  I removed the femoral neck fragment.  A corkscrew was placed into the head and the head was removed.  This was passed to  the back table and was measured.   Acetabular exposure was achieved.  I examined the articular cartilage which was intact.  The labrum was intact. A 50 mm trial head was placed and found to have excellent fit.   I then gained femoral exposure taking care to protect the abductors and greater trochanter.  This was performed using standard external rotation, extension, and adduction.  The capsule was peeled off the inner aspect of the greater trochanter, taking care to preserve the short external rotators. A cookie cutter was used to enter the femoral canal, and then the femoral canal finder was used to confirm location.  I then sequentially broached up to a size 7.  Calcar planer was used on the femoral neck remnant.  I paced a hi neck and a 36 + 1.5 head ball. The hip was reduced.  Leg lengths were checked fluoroscopically.  The hip was dislocated and trial components were removed.  I placed the real stem followed by the real spacer and head ball.  A single reduction maneuver was performed and the hip was reduced.  Fluoroscopy was used to confirm component position and leg lengths.  At 90 degrees of external rotation and extension, the hip was stable to an anterior directed force.   The wound was copiously irrigated with Irrisept solution and normal saline using pule lavage.  Marcaine solution was injected into the periarticular soft tissue.  The wound was closed in layers using #1 Vicryl and V-Loc for the fascia, 2-0 Vicryl for the subcutaneous fat, 2-0 Monocryl for the deep dermal layer, 3-0 running Monocryl subcuticular stitch and glue for the skin.  Once the glue was fully dried, an Aquacell Ag dressing was applied.  The patient was then awakened from anesthesia and transported to the recovery room in stable condition.  Sponge, needle, and instrument counts were correct at the end of the case x2.  The patient tolerated the procedure well and there were no known complications.  Please note that a surgical  assistant was a medical necessity for this procedure to perform it in a safe and expeditious manner. Assistant was necessary to provide appropriate retraction of vital neurovascular structures, to prevent femoral fracture, and to allow for anatomic placement of the prosthesis.  POSTOPERATIVE PLAN: Patient will be readmitted to the hospitalist service.  He may weight-bear as tolerated right lower extremity with a walker.  Beginning tomorrow morning, resume Xarelto at 10 mg daily.  After 72 hours, the normal home dose may be resumed.  Mobilize out of bed with physical therapy.  He will undergo disposition planning.  Return to the office for routine 2-week postoperative care.

## 2019-07-30 NOTE — Transfer of Care (Signed)
Immediate Anesthesia Transfer of Care Note  Patient: Jordan Long.  Procedure(s) Performed: ARTHROPLASTY BIPOLAR HIP (HEMIARTHROPLASTY) (Right Hip)  Patient Location: PACU  Anesthesia Type:General  Level of Consciousness: sedated  Airway & Oxygen Therapy: Patient Spontanous Breathing and Patient connected to nasal cannula oxygen  Post-op Assessment: Report given to RN and Post -op Vital signs reviewed and stable  Post vital signs: Reviewed and stable  Last Vitals:  Vitals Value Taken Time  BP    Temp    Pulse    Resp    SpO2      Last Pain:  Vitals:   07/30/19 0352  TempSrc: Oral  PainSc:          Complications: No complications documented.

## 2019-07-30 NOTE — Anesthesia Postprocedure Evaluation (Signed)
Anesthesia Post Note  Patient: Jordan Long.  Procedure(s) Performed: ARTHROPLASTY BIPOLAR HIP (HEMIARTHROPLASTY) (Right Hip)     Patient location during evaluation: PACU Anesthesia Type: General Level of consciousness: awake and alert Pain management: pain level controlled Vital Signs Assessment: post-procedure vital signs reviewed and stable Respiratory status: spontaneous breathing, nonlabored ventilation, respiratory function stable and patient connected to nasal cannula oxygen Cardiovascular status: blood pressure returned to baseline and stable Postop Assessment: no apparent nausea or vomiting Anesthetic complications: no   No complications documented.  Last Vitals:  Vitals:   07/30/19 1234 07/30/19 1734  BP: 107/78 106/67  Pulse: 70 93  Resp: 18 18  Temp: 36.8 C 37 C  SpO2: 97% 98%    Last Pain:  Vitals:   07/30/19 1734  TempSrc: Oral  PainSc:                  Tiajuana Amass

## 2019-07-30 NOTE — Progress Notes (Signed)
Subjective:  O/N Events: None  Jordan Long was evaluated at bedside this morning. Patient was seen post-op and was still waking up from sedation, he was able to tell the team that he feels good and is tired.   Objective:  Vital signs in last 24 hours: Vitals:   07/29/19 0935 07/29/19 1351 07/29/19 2047 07/30/19 0352  BP: 117/78 119/72 120/70 119/70  Pulse: 66 70 66 69  Resp:  17 18 17   Temp: 98.3 F (36.8 C) 98.5 F (36.9 C) 98.4 F (36.9 C) 97.8 F (36.6 C)  TempSrc: Oral Oral  Oral  SpO2: 97% 95% 98% 98%  Weight:      Height:        Physical Exam Vitals and nursing note reviewed.  Cardiovascular:     Rate and Rhythm: Normal rate and regular rhythm.     Pulses: Normal pulses.     Heart sounds: Normal heart sounds. No murmur heard.  No friction rub. No gallop.   Pulmonary:     Effort: Pulmonary effort is normal.     Breath sounds: Normal breath sounds. No wheezing, rhonchi or rales.  Abdominal:     General: Abdomen is flat. Bowel sounds are normal.  Musculoskeletal:        General: No swelling or deformity.     Comments: R  Hip surgical site intact, no erythema noted.   Skin:    General: Skin is warm and dry.     Findings: No bruising, erythema or lesion.     LABS:  CBC Latest Ref Rng & Units 07/30/2019 07/29/2019 06/18/2019  WBC 4.0 - 10.5 K/uL 8.6 12.6(H) 6.6  Hemoglobin 13.0 - 17.0 g/dL 10.7(L) 11.8(L) 11.9(L)  Hematocrit 39 - 52 % 34.3(L) 37.1(L) 38.3(L)  Platelets 150 - 400 K/uL 247 313 322    CMP Latest Ref Rng & Units 07/30/2019 07/29/2019 06/18/2019  Glucose 70 - 99 mg/dL 104(H) 145(H) 104(H)  BUN 8 - 23 mg/dL 17 24(H) 16  Creatinine 0.61 - 1.24 mg/dL 0.99 1.15 1.03  Sodium 135 - 145 mmol/L 137 137 138  Potassium 3.5 - 5.1 mmol/L 3.8 3.8 3.9  Chloride 98 - 111 mmol/L 104 104 102  CO2 22 - 32 mmol/L 25 24 26   Calcium 8.9 - 10.3 mg/dL 8.8(L) 9.1 8.8(L)  Total Protein 6.5 - 8.1 g/dL 6.0(L) 6.8 6.9  Total Bilirubin 0.3 - 1.2 mg/dL 0.8 0.6 0.5    Alkaline Phos 38 - 126 U/L 118 150(H) 94  AST 15 - 41 U/L 19 21 24   ALT 0 - 44 U/L 18 20 17      Assessment/Plan:  Active Problems:   Fracture   Fall   Right hip pain  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 Femoral fracture.   Right Displaced Femoral Neck Fracture 2/2 Fall:  - Xarelto held today, will follow surgery's recommendations. Start Xarelto 10 mg QD for 72 hours on 07/31/2019. Start home dose after 72 hours. - R. Hip hemiarthoplasty today. - SCDs - schedule hydrocodone 7.5-325 mg (gets 5-325 q12h scheduled at his facility) and tylenol  - fentanyl prn severe pain   Alzheimer's Dementia:   - Continue donepezil, cymbalta, risperidone - PCP with instructions to discontinue risperidone after visit on 06/14/19, will continue taper after surgery.   Dispo: Anticipated discharge pending medical improvement.   Maudie Mercury, MD 07/30/2019, 6:12 AM Pager:  [phone] After 5pm on weekdays and 1pm on weekends: On Call pager [phone]

## 2019-07-30 NOTE — Progress Notes (Signed)
  Date: 07/30/2019  Patient name: Jordan Long.  Medical record number: [id number]  Date of birth: Aug 31, 1950   This patient's plan of care was discussed with the house staff. Please see Dr. Gilford Rile' note for complete details. I concur with his findings.   Sid Falcon, MD 07/30/2019, 12:36 PM

## 2019-07-30 NOTE — Interval H&P Note (Signed)
History and Physical Interval Note:  07/30/2019 7:26 AM  Jordan Long.  has presented today for surgery, with the diagnosis of RIGHT HIP FRACTURE.  The various methods of treatment have been discussed with the patient and family. After consideration of risks, benefits and other options for treatment, the patient has consented to  Procedure(s): ARTHROPLASTY BIPOLAR HIP (HEMIARTHROPLASTY) (Right) as a surgical intervention.  The patient's history has been reviewed, patient examined, no change in status, stable for surgery.  I have reviewed the patient's chart and labs.  Questions were answered to the patient's satisfaction.    The risks, benefits, and alternatives were discussed with the patient / family. There are risks associated with the surgery including, but not limited to, problems with anesthesia (death), infection, instability (giving out of the joint), dislocation, differences in leg length/angulation/rotation, fracture of bones, loosening or failure of implants, hematoma (blood accumulation) which may require surgical drainage, blood clots, pulmonary embolism, nerve injury (foot drop and lateral thigh numbness), and blood vessel injury. The patient / family understand these risks and elects to proceed.    Hilton Cork Zacharias Ridling

## 2019-07-30 NOTE — Progress Notes (Signed)
Patient to OR

## 2019-07-31 ENCOUNTER — Telehealth: Payer: Self-pay | Admitting: *Deleted

## 2019-07-31 ENCOUNTER — Encounter (HOSPITAL_COMMUNITY): Payer: Self-pay | Admitting: Orthopedic Surgery

## 2019-07-31 LAB — CBC
HCT: 28 % — ABNORMAL LOW (ref 39.0–52.0)
Hemoglobin: 8.8 g/dL — ABNORMAL LOW (ref 13.0–17.0)
MCH: 28.3 pg (ref 26.0–34.0)
MCHC: 31.4 g/dL (ref 30.0–36.0)
MCV: 90 fL (ref 80.0–100.0)
Platelets: 245 10*3/uL (ref 150–400)
RBC: 3.11 MIL/uL — ABNORMAL LOW (ref 4.22–5.81)
RDW: 15.4 % (ref 11.5–15.5)
WBC: 10.4 10*3/uL (ref 4.0–10.5)
nRBC: 0 % (ref 0.0–0.2)

## 2019-07-31 LAB — BASIC METABOLIC PANEL
Anion gap: 8 (ref 5–15)
BUN: 22 mg/dL (ref 8–23)
CO2: 25 mmol/L (ref 22–32)
Calcium: 8.5 mg/dL — ABNORMAL LOW (ref 8.9–10.3)
Chloride: 104 mmol/L (ref 98–111)
Creatinine, Ser: 1.11 mg/dL (ref 0.61–1.24)
GFR calc Af Amer: >60 mL/min (ref >60)
GFR calc non Af Amer: >60 mL/min (ref >60)
Glucose, Bld: 140 mg/dL — ABNORMAL HIGH (ref 70–99)
Potassium: 4.2 mmol/L (ref 3.5–5.1)
Sodium: 137 mmol/L (ref 135–145)

## 2019-07-31 MED ORDER — RIVAROXABAN 10 MG PO TABS
10.0000 mg | ORAL_TABLET | Freq: Every day | ORAL | Status: DC
  Administered 2019-07-31 – 2019-08-02 (×3): 10 mg via ORAL
  Filled 2019-07-31 (×3): qty 1

## 2019-07-31 MED ORDER — ACETAMINOPHEN 325 MG PO TABS: 650.0000 mg | ORAL_TABLET | Freq: Four times a day (QID) | ORAL | 1 refills | Status: DC | PRN

## 2019-07-31 NOTE — NC FL2 (Signed)
[place] LEVEL OF CARE SCREENING TOOL     IDENTIFICATION  Patient Name: Jordan Long. Birthdate: [birth year] Sex: male Admission Date (Current Location): 07/28/2019  [facility] and [place] Number:  Herbalist and Address:  [facility], [place] [address] [place], [place] [address]      Provider Number: [id number]  Attending Physician Name and Address:  Sid Falcon, MD  Relative Name and Phone Number:  Ledora Bottcher    Current Level of Care: Hospital Recommended Level of Care: [place] Prior Approval Number:    Date Approved/Denied:   PASRR Number: [id number]  Discharge Plan: SNF    Current Diagnoses: Patient Active Problem List   Diagnosis Date Noted   Fracture 07/29/2019   Fall    Right hip pain    Physical deconditioning 04/21/2019   Basal cell carcinoma (BCC) 12/27/2017   Hypercoagulable state ([place]) 01/20/2016   Chronic venous insufficiency 06/04/2015   Alzheimer's dementia ([place]) 12/25/2013   Dyslipidemia 06/13/2013   Preventative health care 12/09/2012   Osteoarthritis of knees, bilateral 07/13/2011   GERD (gastroesophageal reflux disease) 07/13/2011   Generalized anxiety disorder 03/02/2011    Orientation RESPIRATION BLADDER Height & Weight     Self  Normal Indwelling catheter Weight: 76 kg Height:  5\' 7"  (170.2 cm)  BEHAVIORAL SYMPTOMS/MOOD NEUROLOGICAL BOWEL NUTRITION STATUS      Continent Diet (See Discharge Summary)  AMBULATORY STATUS COMMUNICATION OF NEEDS Skin   Extensive Assist Verbally Surgical wounds                       Personal Care Assistance Level of Assistance  Bathing, Feeding, Dressing Bathing Assistance: Limited assistance Feeding assistance: Limited assistance Dressing Assistance: Maximum assistance     Functional Limitations Info  Sight, Hearing, Speech Sight Info: Impaired Hearing Info: Adequate Speech Info: Adequate    SPECIAL  CARE FACTORS FREQUENCY  PT (By licensed PT), OT (By licensed OT)     PT Frequency: 5 times per week OT Frequency: 5 times per week            Contractures Contractures Info: Not present    Additional Factors Info  Code Status, Allergies, Psychotropic Code Status Info: Full code Allergies Info: NKDA Psychotropic Info: Cymbalta, Risperdal, Aricept         Current Medications (07/31/2019):  This is the current hospital active medication list Current Facility-Administered Medications  Medication Dose Route Frequency Provider Last Rate Last Admin   acetaminophen (TYLENOL) tablet 325-650 mg  325-650 mg Oral Q6H PRN Swinteck, Aaron Edelman, MD       acetaminophen (TYLENOL) tablet 650 mg  650 mg Oral Daily Rod Can, MD   650 mg at 07/30/19 1517   cetaphil lotion   Topical BID Rod Can, MD   Given at 07/31/19 1040   Chlorhexidine Gluconate Cloth 2 % PADS 6 each  6 each Topical Daily Sid Falcon, MD   6 each at 07/31/19 1040   docusate sodium (COLACE) capsule 100 mg  100 mg Oral BID Rod Can, MD   100 mg at 07/31/19 1040   donepezil (ARICEPT) tablet 10 mg  10 mg Oral QHS Rod Can, MD   10 mg at 07/30/19 2114   DULoxetine (CYMBALTA) DR capsule 60 mg  60 mg Oral Daily Rod Can, MD   60 mg at 07/31/19 1040   feeding supplement (ENSURE ENLIVE) (ENSURE ENLIVE) liquid 237 mL  237 mL Oral  BID BM Rod Can, MD   237 mL at 07/30/19 1512   HYDROcodone-acetaminophen (NORCO) 7.5-325 MG per tablet 1 tablet  1 tablet Oral Q8H Seawell, Jaimie A, DO   1 tablet at 07/31/19 0606   HYDROcodone-acetaminophen (NORCO) 7.5-325 MG per tablet 1-2 tablet  1-2 tablet Oral Q4H PRN Swinteck, Aaron Edelman, MD       menthol-cetylpyridinium (CEPACOL) lozenge 3 mg  1 lozenge Oral PRN Swinteck, Aaron Edelman, MD       Or   phenol (CHLORASEPTIC) mouth spray 1 spray  1 spray Mouth/Throat PRN Swinteck, Aaron Edelman, MD       metoCLOPramide (REGLAN) tablet 5-10 mg  5-10 mg Oral Q8H PRN Swinteck,  Aaron Edelman, MD       Or   metoCLOPramide (REGLAN) injection 5-10 mg  5-10 mg Intravenous Q8H PRN Swinteck, Aaron Edelman, MD       morphine 2 MG/ML injection 0.5-1 mg  0.5-1 mg Intravenous Q2H PRN Swinteck, Aaron Edelman, MD       multivitamin with minerals tablet 1 tablet  1 tablet Oral Daily Swinteck, Brian, MD   1 tablet at 07/31/19 1040   ondansetron (ZOFRAN) tablet 4 mg  4 mg Oral Q6H PRN Swinteck, Aaron Edelman, MD       Or   ondansetron (ZOFRAN) injection 4 mg  4 mg Intravenous Q6H PRN Swinteck, Aaron Edelman, MD       pantoprazole (PROTONIX) EC tablet 40 mg  40 mg Oral Daily Swinteck, Aaron Edelman, MD   40 mg at 07/31/19 1040   polyethylene glycol (MIRALAX / GLYCOLAX) packet 17 g  17 g Oral Daily Maudie Mercury, MD   17 g at 07/30/19 2113   risperiDONE (RISPERDAL) tablet 0.25 mg  0.25 mg Oral QHS Swinteck, Aaron Edelman, MD   0.25 mg at 07/30/19 2114   rivaroxaban (XARELTO) tablet 10 mg  10 mg Oral Daily Rod Can, MD   10 mg at 07/31/19 1040   senna (SENOKOT) tablet 8.6 mg  1 tablet Oral BID Rod Can, MD   8.6 mg at 07/31/19 1040     Discharge Medications: Please see discharge summary for a list of discharge medications.  Relevant Imaging Results:  Relevant Lab Results:   Additional Information SSN [id number]  Curlene Labrum, RN

## 2019-07-31 NOTE — Plan of Care (Signed)

## 2019-07-31 NOTE — Progress Notes (Signed)
Reviewed code status after IV access was lost tonight. Patient listed as full code and found this contradictory to notes which mention patient was recently moved to hospice care. Confirmed code status with HCPOA/son , Shanon Brow, and updated to DNR.

## 2019-07-31 NOTE — Telephone Encounter (Signed)
Jordan Long calls and ask if dr Evette Doffing has seen pt and if he does to call her if he has "any wisdom to share with me"

## 2019-07-31 NOTE — Progress Notes (Signed)
Internal Medicine Attending:   I saw and examined the patient. I reviewed the resident's note and I agree with the resident's findings and plan as documented in the resident's note.  Patient denies any pain currently but states that the right leg feels warm.  Patient was initially admitted to the hospital with right displaced femoral neck fracture secondary to fall.  Patient is status post right hip hemiarthroplasty yesterday.  Continue pain control for now.  Patient is weightbearing as tolerated with a walker.  PT recommending SNF placement.  Will resume Xarelto 10 mg daily today (for 72 hours) and then resume full dose Xarelto.  No further work-up at this time.  Patient stable for DC to SNF once bed is available.

## 2019-07-31 NOTE — Evaluation (Signed)
Physical Therapy Evaluation Patient Details Name: Jordan Long. MRN: [id number] DOB: Feb 29, 1950 Today's Date: 07/31/2019   History of Present Illness  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 Underwent R hip hemiarthroplasty on 07/30/19.    Clinical Impression  Pt admitted with above diagnosis. Pt minimally verbal and followed ~50% of basic commands on eval, per chart seems close to baseline cognitively. Pt required max A for bed mobility as well as sit to stand. Pt unable to step feet in standing, L and posterior lean maintained. Performed 4 times but still unable to progress ambulation. Recommend SNF unless ALF able to provide current LOC.  Pt currently with functional limitations due to the deficits listed below (see PT Problem List). Pt will benefit from skilled PT to increase their independence and safety with mobility to allow discharge to the venue listed below.       Follow Up Recommendations SNF;Supervision/Assistance - 24 hour    Equipment Recommendations  None recommended by PT    Recommendations for Other Services       Precautions / Restrictions Precautions Precautions: Fall Restrictions Weight Bearing Restrictions: Yes RLE Weight Bearing: Weight bearing as tolerated      Mobility  Bed Mobility Overal bed mobility: Needs Assistance Bed Mobility: Supine to Sit;Sit to Supine     Supine to sit: Max assist Sit to supine: Mod assist   General bed mobility comments: max A to initiate mvmt to EOB, pt able to bring self up into partial long sitting but unable to scoot to EOB from here so cued to lie back down and rolled to R with mod A for LE's off bed and max A for elevation of trunk. Pt able to assist with lifting LE's back into bed, min A to RLE, max A for positioning in the bed  Transfers Overall transfer level: Needs  assistance Equipment used: Rolling walker (2 wheeled) Transfers: Sit to/from Stand Sit to Stand: Mod assist         General transfer comment: performed 4x with mod A for power up each time, pt avoidant of RLE wt bearing and with posterior lean.   Ambulation/Gait             General Gait Details: unable to step feet in standing  Stairs            Wheelchair Mobility    Modified Rankin (Stroke Patients Only)       Balance Overall balance assessment: Needs assistance;History of Falls Sitting-balance support: Bilateral upper extremity supported;Feet supported Sitting balance-Leahy Scale: Fair Sitting balance - Comments: close guarding given EOB but pt able to maintain upright positioning Postural control: Posterior lean Standing balance support: Bilateral upper extremity supported;During functional activity Standing balance-Leahy Scale: Poor Standing balance comment: L and posterior lean in standing. Unable to wt shift. Needs UE support as well as mod external support                             Pertinent Vitals/Pain Pain Assessment: Faces Faces Pain Scale: Hurts little more Pain Location: R hip Pain Descriptors / Indicators: Grimacing;Operative site guarding Pain Intervention(s): Limited activity within patient's tolerance;Monitored during session    Home Living Family/patient expects to be discharged to:: Assisted living  Additional Comments: pt from [employer], has been admitted with multiple falls prior to this one. Pt unable to give any history    Prior Function Level of Independence: Needs assistance         Comments: unsure of PLOF     Hand Dominance        Extremity/Trunk Assessment   Upper Extremity Assessment Upper Extremity Assessment: Generalized weakness;Difficult to assess due to impaired cognition    Lower Extremity Assessment Lower Extremity Assessment: Generalized weakness;RLE  deficits/detail;Difficult to assess due to impaired cognition RLE Deficits / Details: hip flex 2/5, knee ext 2/5, tremor noted, assume is pre existing RLE Sensation: WNL RLE Coordination: decreased gross motor    Cervical / Trunk Assessment Cervical / Trunk Assessment: Kyphotic  Communication   Communication: Receptive difficulties;Expressive difficulties  Cognition Arousal/Alertness: Awake/alert Behavior During Therapy: WFL for tasks assessed/performed Overall Cognitive Status: History of cognitive impairments - at baseline                                 General Comments: pt minimally verbal. follows basic instructional commands 50% of time      General Comments General comments (skin integrity, edema, etc.): VSS    Exercises     Assessment/Plan    PT Assessment Patient needs continued PT services  PT Problem List Decreased strength;Decreased range of motion;Decreased activity tolerance;Decreased balance;Decreased mobility;Decreased coordination;Decreased cognition;Decreased knowledge of use of DME;Decreased safety awareness;Decreased knowledge of precautions;Pain       PT Treatment Interventions DME instruction;Gait training;Functional mobility training;Therapeutic activities;Therapeutic exercise;Balance training;Patient/family education    PT Goals (Current goals can be found in the Care Plan section)  Acute Rehab PT Goals Patient Stated Goal: none stated PT Goal Formulation: Patient unable to participate in goal setting Time For Goal Achievement: 08/14/19 Potential to Achieve Goals: Fair    Frequency Min 3X/week   Barriers to discharge Decreased caregiver support unsure how much supervision ALF able to give    Co-evaluation               AM-PAC PT "6 Clicks" Mobility  Outcome Measure Help needed turning from your back to your side while in a flat bed without using bedrails?: A Lot Help needed moving from lying on your back to sitting on the  side of a flat bed without using bedrails?: A Lot Help needed moving to and from a bed to a chair (including a wheelchair)?: A Lot Help needed standing up from a chair using your arms (e.g., wheelchair or bedside chair)?: Total Help needed to walk in hospital room?: Total Help needed climbing 3-5 steps with a railing? : Total 6 Click Score: 9    End of Session Equipment Utilized During Treatment: Gait belt Activity Tolerance: Patient tolerated treatment well Patient left: in bed;with call bell/phone within reach;with bed alarm set Nurse Communication: Mobility status PT Visit Diagnosis: Unsteadiness on feet (R26.81);Repeated falls (R29.6);Difficulty in walking, not elsewhere classified (R26.2);Pain Pain - Right/Left: Right Pain - part of body: Hip    Time: 0814-4818 PT Time Calculation (min) (ACUTE ONLY): 18 min   Charges:   PT Evaluation $PT Eval Moderate Complexity: [place]  Pager [phone] Office [place] 07/31/2019, 10:58 AM

## 2019-07-31 NOTE — Progress Notes (Signed)
Subjective:  O/N Events: None  Patient resting comfortably in bed. He denies leg pain, but states it feel a little warm. Otherwise her denies new symptoms today. I discussed that importance of working with physical therapy.   Objective:  Vital signs in last 24 hours: Vitals:   07/30/19 1734 07/30/19 2017 07/30/19 2347 07/31/19 0355  BP: 106/67 102/62 121/78 117/70  Pulse: 93 84 82 73  Resp: 18 16 16 16   Temp: 98.6 F (37 C) 98.4 F (36.9 C) 98.2 F (36.8 C) 98.7 F (37.1 C)  TempSrc: Oral Oral Oral   SpO2: 98% 96% 98% 98%  Weight:      Height:       Physical Exam Constitutional:      General: He is not in acute distress.    Appearance: He is not ill-appearing or toxic-appearing.  Cardiovascular:     Rate and Rhythm: Normal rate and regular rhythm.     Pulses: Normal pulses.     Heart sounds: Normal heart sounds. No murmur heard.  No friction rub. No gallop.   Pulmonary:     Effort: Pulmonary effort is normal.     Breath sounds: Normal breath sounds. No wheezing, rhonchi or rales.  Abdominal:     General: Abdomen is flat. Bowel sounds are normal.  Musculoskeletal:        General: No tenderness or deformity.     Right lower leg: No edema.     Left lower leg: No edema.     Comments: Dressing on the right hip is intact, no erythema, purulence, or drainage appreciated on physical examination.   Skin:    General: Skin is warm and dry.     Findings: No erythema, lesion or rash.  Neurological:     Mental Status: He is alert.      LABS:  CBC Latest Ref Rng & Units 07/30/2019 07/29/2019 06/18/2019  WBC 4.0 - 10.5 K/uL 8.6 12.6(H) 6.6  Hemoglobin 13.0 - 17.0 g/dL 10.7(L) 11.8(L) 11.9(L)  Hematocrit 39 - 52 % 34.3(L) 37.1(L) 38.3(L)  Platelets 150 - 400 K/uL 247 313 322    CMP Latest Ref Rng & Units 07/30/2019 07/29/2019 06/18/2019  Glucose 70 - 99 mg/dL 104(H) 145(H) 104(H)  BUN 8 - 23 mg/dL 17 24(H) 16  Creatinine 0.61 - 1.24 mg/dL 0.99 1.15 1.03  Sodium 135 - 145  mmol/L 137 137 138  Potassium 3.5 - 5.1 mmol/L 3.8 3.8 3.9  Chloride 98 - 111 mmol/L 104 104 102  CO2 22 - 32 mmol/L 25 24 26   Calcium 8.9 - 10.3 mg/dL 8.8(L) 9.1 8.8(L)  Total Protein 6.5 - 8.1 g/dL 6.0(L) 6.8 6.9  Total Bilirubin 0.3 - 1.2 mg/dL 0.8 0.6 0.5  Alkaline Phos 38 - 126 U/L 118 150(H) 94  AST 15 - 41 U/L 19 21 24   ALT 0 - 44 U/L 18 20 17      Assessment/Plan:  Active Problems:   Fracture   Fall   Right hip pain  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 Femoral fracture.   Right Displaced Femoral Neck Fracture 2/2 Fall:  - Xarelto 10 mg - SCDs - schedule hydrocodone 7.5-325 mg (gets 5-325 q12h scheduled at his facility) and tylenol  - fentanyl prn severe pain   Alzheimer's Dementia:   - Continue donepezil, cymbalta, risperidone - PCP with instructions to discontinue risperidone  after visit on 06/14/19, will continue taper after surgery.   Dispo: Anticipated discharge pending medical improvement.   Maudie Mercury, MD 07/31/2019, 7:09 AM Pager: [phone] After 5pm on weekdays and 1pm on weekends: On Call pager [phone]

## 2019-07-31 NOTE — TOC Initial Note (Addendum)
Transition of Care (TOC) - Initial/Assessment Note    Patient Details  Name: Jordan Long. MRN: [id number] Date of Birth: [birth year]  Transition of Care [facility]) CM/SW Contact:    Curlene Labrum, RN Phone Number: 07/31/2019, 3:58 PM  Clinical Narrative:                 Case management called the sister, [name], and spoke with her regarding SNF workup for Rehab.  The patient has advanced dementia and his two sons live out of town.  The sister states that she prefers [facility] for rehab.  Patient completed the [facility] and waiting on physician's signature before starting insurance authorization.  6/28 1630 - started insurance authorization for [employer]. Expected Discharge Plan: Skilled Nursing Facility Barriers to Discharge: Continued Medical Work up, SNF Pending bed offer   Patient Goals and CMS Choice Patient states their goals for this hospitalization and ongoing recovery are:: The patient's sister would like to get better and go to a SNF before returning to the memory unit. CMS Medicare.gov Compare Post Acute Care list provided to:: Patient Represenative (must comment) (Patient's sister, [name]) Choice offered to / list presented to : [facility] POA / Guardian [name], sister)  Expected Discharge Plan and Services Expected Discharge Plan: [place]   Discharge Planning Services: CM Consult Post Acute Care Choice: [place] Living arrangements for the past 2 months: [place] (Memory Care Unit at [facility])                                      Prior Living Arrangements/Services Living arrangements for the past 2 months: [place] (Memory Care Unit at [facility]) Lives with:: Facility Resident Patient language and need for interpreter reviewed:: Yes Do you feel safe going back to the place where you live?: No   Patient will need SNF placement prior to returning to [facility] Unit.  Need for Family Participation in Patient Care: Yes (Comment) Care giver support system in place?: Yes (comment)   Criminal Activity/Legal Involvement Pertinent to Current Situation/Hospitalization: No - Comment as needed  Activities of Daily Living      Permission Sought/Granted Permission sought to share information with : Case Manager Permission granted to share information with : Yes, Verbal Permission Granted     Permission granted to share info w AGENCY: SNF facility     Permission granted to share info w Contact Information: [name], sister  Emotional Assessment Appearance:: Appears stated age Attitude/Demeanor/Rapport: Engaged Affect (typically observed): Accepting Orientation: : Oriented to Self Alcohol / Substance Use: Not Applicable Psych Involvement: No (comment)  Admission diagnosis:  Fracture [T14.8XXA] Trauma [T14.90XA] Right hip pain [M25.551] Fall, initial encounter [W19.XXXA] Patient Active Problem List   Diagnosis Date Noted  . Fracture 07/29/2019  . Fall   . Right hip pain   . Physical deconditioning 04/21/2019  . Basal cell carcinoma (BCC) 12/27/2017  . Hypercoagulable state ([place]) 01/20/2016  . Chronic venous insufficiency 06/04/2015  . Alzheimer's dementia ([place]) 12/25/2013  . Dyslipidemia 06/13/2013  . Preventative health care 12/09/2012  . Osteoarthritis of knees, bilateral 07/13/2011  . GERD (gastroesophageal reflux disease) 07/13/2011  . Generalized anxiety disorder 03/02/2011   PCP:  Axel Filler, MD Pharmacy:   [facility], [address]. [place] [address] Phone: [phone] Fax:  [facility], [place] [address] [place] [place] [address] Phone: [phone] Fax: [phone]  [id number] - [place], [address] AT [address]. [place] [address] Phone: [phone] Fax: [phone]     Social Determinants of Health (SDOH) Interventions    Readmission Risk Interventions No flowsheet data found.

## 2019-07-31 NOTE — Progress Notes (Signed)
    Subjective:  No events per RN. Noted to have pressure sore to heel  Objective:   VITALS:   Vitals:   07/30/19 1734 07/30/19 2017 07/30/19 2347 07/31/19 0355  BP: 106/67 102/62 121/78 117/70  Pulse: 93 84 82 73  Resp: 18 16 16 16   Temp: 98.6 F (37 C) 98.4 F (36.9 C) 98.2 F (36.8 C) 98.7 F (37.1 C)  TempSrc: Oral Oral Oral   SpO2: 98% 96% 98% 98%  Weight:      Height:        NAD, pleasantly confused ABD soft Intact pulses distally Dorsiflexion/Plantar flexion intact Incision: dressing C/D/I Unable to obtained detailed sensory exam due to MS  Lab Results  Component Value Date   WBC 10.4 07/31/2019   HGB 8.8 (L) 07/31/2019   HCT 28.0 (L) 07/31/2019   MCV 90.0 07/31/2019   PLT 245 07/31/2019   BMET    Component Value Date/Time   NA 137 07/31/2019 0933   NA 140 06/14/2019 0930   K 4.2 07/31/2019 0933   CL 104 07/31/2019 0933   CO2 25 07/31/2019 0933   GLUCOSE 140 (H) 07/31/2019 0933   BUN 22 07/31/2019 0933   BUN 19 06/14/2019 0930   CREATININE 1.11 07/31/2019 0933   CREATININE 1.37 (H) 06/08/2012 1521   CALCIUM 8.5 (L) 07/31/2019 0933   GFRNONAA >60 07/31/2019 0933   GFRAA >60 07/31/2019 0933     Assessment/Plan: 1 Day Post-Op   Active Problems:   Fracture   Fall   Right hip pain   WBAT with walker DVT ppx: xarelto 10 mg for 72 hrs --> then resume full dose, SCDs, TEDS PO pain control PT/OT Dispo: d/c planning, f/u 2 weeks   Bertram Savin 07/31/2019, 12:06 PM   Rod Can, MD [phone] [place] is now [facility]  [place] [address], [address], [place], [place] [address] Phone: [phone] [email or web address] Facebook  [employer]

## 2019-07-31 NOTE — Plan of Care (Signed)
  Problem: Education: Goal: Knowledge of General Education information will improve Description: Including pain rating scale, medication(s)/side effects and non-pharmacologic comfort measures Outcome: Progressing   Problem: Pain Managment: Goal: General experience of comfort will improve Outcome: Progressing   Problem: Safety: Goal: Ability to remain free from injury will improve Outcome: Progressing   

## 2019-08-01 LAB — CBC
HCT: 24.1 % — ABNORMAL LOW (ref 39.0–52.0)
Hemoglobin: 7.6 g/dL — ABNORMAL LOW (ref 13.0–17.0)
MCH: 28.6 pg (ref 26.0–34.0)
MCHC: 31.5 g/dL (ref 30.0–36.0)
MCV: 90.6 fL (ref 80.0–100.0)
Platelets: 249 10*3/uL (ref 150–400)
RBC: 2.66 MIL/uL — ABNORMAL LOW (ref 4.22–5.81)
RDW: 15.7 % — ABNORMAL HIGH (ref 11.5–15.5)
WBC: 8.3 10*3/uL (ref 4.0–10.5)
nRBC: 0 % (ref 0.0–0.2)

## 2019-08-01 LAB — SARS CORONAVIRUS 2 (TAT 6-24 HRS): SARS Coronavirus 2: NEGATIVE

## 2019-08-01 MED ORDER — HYDROCODONE-ACETAMINOPHEN 7.5-325 MG PO TABS
1.0000 | ORAL_TABLET | Freq: Two times a day (BID) | ORAL | Status: DC
  Administered 2019-08-01 – 2019-08-02 (×3): 1 via ORAL
  Filled 2019-08-01 (×3): qty 1

## 2019-08-01 NOTE — Progress Notes (Signed)
Physical Therapy Treatment Patient Details Name: Jordan Long. MRN: [id number] DOB: 1950/05/08 Today's Date: 08/01/2019    History of Present Illness 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 Underwent R hip hemiarthroplasty on 07/30/19.      PT Comments    Pt supine in bed on arrival.  He remains confused due to baseline dementia.  He tolerated increased activity and able to progress to gt training this am.  He continues to benefit from skilled rehab at SNF to improve strength and function.     Follow Up Recommendations  SNF;Supervision/Assistance - 24 hour     Equipment Recommendations  None recommended by PT    Recommendations for Other Services       Precautions / Restrictions Precautions Precautions: Fall Restrictions Weight Bearing Restrictions: Yes RLE Weight Bearing: Weight bearing as tolerated    Mobility  Bed Mobility Overal bed mobility: Needs Assistance Bed Mobility: Supine to Sit;Sit to Supine     Supine to sit: Max assist     General bed mobility comments: Pt continues to require max assistance to advance LEs and elevate trunk into sitting edge of bed.  Transfers Overall transfer level: Needs assistance Equipment used: Rolling walker (2 wheeled) Transfers: Sit to/from Stand Sit to Stand: Mod assist         General transfer comment: Simple cues to power up into standing.  PTA offered physical assistance to achieve standing.  Ambulation/Gait Ambulation/Gait assistance: Mod assist Gait Distance (Feet): 40 Feet Assistive device: Rolling walker (2 wheeled) Gait Pattern/deviations: Step-to pattern;Shuffle;Antalgic;Trunk flexed     General Gait Details: Pt able to advance feet to stepping this session.  Performed short gt trial with cues for sequencing and assistance to maintain correct position of RW.   Assistance to negotiate obstacles in halls.   Stairs             Wheelchair Mobility    Modified Rankin (Stroke Patients Only)       Balance Overall balance assessment: Needs assistance;History of Falls Sitting-balance support: Bilateral upper extremity supported;Feet supported Sitting balance-Leahy Scale: Fair Sitting balance - Comments: close guarding given EOB but pt able to maintain upright positioning Postural control: Posterior lean Standing balance support: Bilateral upper extremity supported;During functional activity Standing balance-Leahy Scale: Poor Standing balance comment: External assistance to maintain balance.                            Cognition Arousal/Alertness: Awake/alert Behavior During Therapy: WFL for tasks assessed/performed Overall Cognitive Status: History of cognitive impairments - at baseline                                 General Comments: pt minimally verbal. follows basic instructional commands 50% of time,  Baseline dementia.      Exercises      General Comments        Pertinent Vitals/Pain Pain Assessment: Faces Faces Pain Scale: Hurts little more Pain Location: R hip Pain Descriptors / Indicators: Grimacing;Operative site guarding Pain Intervention(s): Monitored during session;Repositioned    Home Living                      Prior Function  PT Goals (current goals can now be found in the care plan section) Acute Rehab PT Goals Patient Stated Goal: none stated Potential to Achieve Goals: Fair Progress towards PT goals: Progressing toward goals    Frequency    Min 3X/week      PT Plan Current plan remains appropriate    Co-evaluation              AM-PAC PT "6 Clicks" Mobility   Outcome Measure  Help needed turning from your back to your side while in a flat bed without using bedrails?: A Lot Help needed moving from lying on your back to sitting on the  side of a flat bed without using bedrails?: A Lot Help needed moving to and from a bed to a chair (including a wheelchair)?: A Lot Help needed standing up from a chair using your arms (e.g., wheelchair or bedside chair)?: A Lot Help needed to walk in hospital room?: A Lot Help needed climbing 3-5 steps with a railing? : Total 6 Click Score: 11    End of Session Equipment Utilized During Treatment: Gait belt Activity Tolerance: Patient tolerated treatment well Patient left: in bed;with call bell/phone within reach;with bed alarm set Nurse Communication: Mobility status PT Visit Diagnosis: Unsteadiness on feet (R26.81);Repeated falls (R29.6);Difficulty in walking, not elsewhere classified (R26.2);Pain Pain - Right/Left: Right Pain - part of body: Hip     Time: 2778-2423 PT Time Calculation (min) (ACUTE ONLY): 22 min  Charges:  $Gait Training: 8-22 mins                     Erasmo Leventhal , PTA Acute Rehabilitation Services Pager [phone] Office [phone]     Jordan Long 08/01/2019, 12:04 PM

## 2019-08-01 NOTE — Progress Notes (Addendum)
Subjective:  O/N Events: Code Status Corrected to DNR  Patient is resting comfortably in bed. Able to respond to questions in one word answers. He does not appear distress. He denies pain at this item. I discussed the plan to go to SNF for continued rehabilitation.   Objective:  Vital signs in last 24 hours: Vitals:   07/31/19 0930 07/31/19 1500 07/31/19 2051 08/01/19 0457  BP: 118/70 119/64 (!) 94/58 111/60  Pulse: 76 80 76 84  Resp:  17 18 18   Temp: 98.2 F (36.8 C) 98.5 F (36.9 C) 98.3 F (36.8 C) 98.7 F (37.1 C)  TempSrc: Oral Axillary Oral Oral  SpO2: 99% 99% 100% 100%  Weight:      Height:        Physical Exam Constitutional:      General: He is not in acute distress.    Appearance: He is not ill-appearing.     Comments: Responds in one word answers, no acute distress.   Cardiovascular:     Rate and Rhythm: Normal rate and regular rhythm.     Pulses: Normal pulses.     Heart sounds: Normal heart sounds. No murmur heard.  No friction rub. No gallop.   Pulmonary:     Effort: Pulmonary effort is normal.     Breath sounds: Normal breath sounds. No wheezing, rhonchi or rales.  Abdominal:     General: Abdomen is flat. Bowel sounds are normal.  Musculoskeletal:        General: No deformity or signs of injury.     Right lower leg: No edema.     Left lower leg: No edema.     Comments: Dressing over R thigh intact, skin is warm, no erythema, purulence, or discharge from site.   Skin:    General: Skin is warm.  Neurological:     Mental Status: He is alert.      LABS:  CBC Latest Ref Rng & Units 08/01/2019 07/31/2019 07/30/2019  WBC 4.0 - 10.5 K/uL 8.3 10.4 8.6  Hemoglobin 13.0 - 17.0 g/dL 7.6(L) 8.8(L) 10.7(L)  Hematocrit 39 - 52 % 24.1(L) 28.0(L) 34.3(L)  Platelets 150 - 400 K/uL 249 245 247    CMP Latest Ref Rng & Units 07/31/2019 07/30/2019 07/29/2019  Glucose 70 - 99 mg/dL 140(H) 104(H) 145(H)  BUN 8 - 23 mg/dL 22 17 24(H)  Creatinine 0.61 - 1.24 mg/dL  1.11 0.99 1.15  Sodium 135 - 145 mmol/L 137 137 137  Potassium 3.5 - 5.1 mmol/L 4.2 3.8 3.8  Chloride 98 - 111 mmol/L 104 104 104  CO2 22 - 32 mmol/L 25 25 24   Calcium 8.9 - 10.3 mg/dL 8.5(L) 8.8(L) 9.1  Total Protein 6.5 - 8.1 g/dL - 6.0(L) 6.8  Total Bilirubin 0.3 - 1.2 mg/dL - 0.8 0.6  Alkaline Phos 38 - 126 U/L - 118 150(H)  AST 15 - 41 U/L - 19 21  ALT 0 - 44 U/L - 18 20     Assessment/Plan:  Active Problems:   Fracture   Fall   Right hip pain  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 Femoral fracture.   Right Displaced Femoral Neck Fracture 2/2 Fall:  - Xarelto 10 mg day 2 of 3, will restart home dose August 03, 2019.  - SCDs - schedule hydrocodone 7.5-325 mg (gets 5-325 q12h scheduled at his facility) and  tylenol  - fentanyl prn severe pain  - Hemoglobin 7.6 today.  - PT recommending SNF   Alzheimer's Dementia:   - Continue donepezil, cymbalta, risperidone - PCP with instructions to discontinue risperidone after visit on 06/14/19, will continue taper after surgery.   Dispo: Patient stable for DC anticipated discharge pending SNF placement.   Jordan Mercury, MD 08/01/2019, 6:20 AM Pager: [phone] After 5pm on weekdays and 1pm on weekends: On Call pager [phone]

## 2019-08-01 NOTE — Progress Notes (Signed)
    Subjective:  The patient is sitting in a chair comfortably.  The patient affirms by nodding that he is comfortable.  Objective:   VITALS:   Vitals:   07/31/19 1500 07/31/19 2051 08/01/19 0457 08/01/19 0735  BP: 119/64 (!) 94/58 111/60 107/60  Pulse: 80 76 84 62  Resp: 17 18 18 17   Temp: 98.5 F (36.9 C) 98.3 F (36.8 C) 98.7 F (37.1 C) 98.6 F (37 C)  TempSrc: Axillary Oral Oral Oral  SpO2: 99% 100% 100% 97%  Weight:      Height:        NAD, pleasantly confused ABD soft Intact pulses distally Dorsiflexion/Plantar flexion intact Incision: dressing C/D/I Unable to obtained detailed sensory exam due to MS  Lab Results  Component Value Date   WBC 8.3 08/01/2019   HGB 7.6 (L) 08/01/2019   HCT 24.1 (L) 08/01/2019   MCV 90.6 08/01/2019   PLT 249 08/01/2019   BMET    Component Value Date/Time   NA 137 07/31/2019 0933   NA 140 06/14/2019 0930   K 4.2 07/31/2019 0933   CL 104 07/31/2019 0933   CO2 25 07/31/2019 0933   GLUCOSE 140 (H) 07/31/2019 0933   BUN 22 07/31/2019 0933   BUN 19 06/14/2019 0930   CREATININE 1.11 07/31/2019 0933   CREATININE 1.37 (H) 06/08/2012 1521   CALCIUM 8.5 (L) 07/31/2019 0933   GFRNONAA >60 07/31/2019 0933   GFRAA >60 07/31/2019 0933     Assessment/Plan: 2 Days Post-Op   Active Problems:   Fracture   Fall   Right hip pain   WBAT with walker DVT ppx: xarelto 10 mg for 48 hrs --> then resume full dose, SCDs, TEDS PO pain control PT/OT Dispo: SNF placement pending, f/u 2 weeks   Dorothyann Peng 08/01/2019, 12:07 PM   Rod Can, MD [phone] [place] is now [facility]  [place] [address], [address], [place], [place] [address] Phone: [phone] [email or web address] Facebook  [employer]

## 2019-08-01 NOTE — TOC Transition Note (Signed)
Transition of Care [facility]) - CM/SW Discharge Note   Patient Details  Name: Ernest Popowski. MRN: [id number] Date of Birth: July 03, 1950  Transition of Care [facility]) CM/SW Contact:  Curlene Labrum, RN Phone Number: 08/01/2019, 1:47 PM   Clinical Narrative:    [facility] called back to state that they were not managing the patient's insurance authorization after the clinicals were sent.  I called [facility] and spoke with Claud Kelp and she states that she would start the authorization.   Final next level of care: Skilled Nursing Facility Barriers to Discharge: Continued Medical Work up, SNF Pending bed offer   Patient Goals and CMS Choice Patient states their goals for this hospitalization and ongoing recovery are:: The patient's sister would like to get better and go to a SNF before returning to the memory unit. CMS Medicare.gov Compare Post Acute Care list provided to:: Patient Represenative (must comment) (Patient's sister, [name]) Choice offered to / list presented to : [facility] POA / Guardian [name], sister)  Discharge Placement                       Discharge Plan and Services   Discharge Planning Services: CM Consult Post Acute Care Choice: [place]                               Social Determinants of Health (SDOH) Interventions     Readmission Risk Interventions No flowsheet data found.

## 2019-08-01 NOTE — Telephone Encounter (Signed)
Thanks. I spoke with Jordan Long this morning. Offered support, answered some questions. Sounds like he will be going to [place] or [facility] soon. We will have to wait and see how much function he recovers.

## 2019-08-01 NOTE — Progress Notes (Signed)
Internal Medicine Attending:   I saw and examined the patient. I reviewed the resident's note and I agree with the resident's findings and plan as documented in the resident's note.  Patient is resting comfortably in bed and denies any complaints at this time.  Patient was initially admitted to the hospital with right displaced femoral neck fracture secondary to fall.  Patient is now status post right hip hemiarthroplasty postop day #2.  PT recommending SNF placement.  Continue with weightbearing as tolerated.  Patient was resumed on Xarelto 10 mg daily (for 72 hours) yesterday and will then be transition to full dose Xarelto.  Of note, patient's hemoglobin is now down to 7.6.  I suspect this is secondary to blood loss from the fracture and surgery.  Patient vitals remained stable.  Patient surgical site is clean and intact with no evidence of ecchymosis or hematoma.  No further work-up at this time.  Patient stable for DC to SNF once bed is available.

## 2019-08-02 ENCOUNTER — Other Ambulatory Visit: Payer: Self-pay | Admitting: Internal Medicine

## 2019-08-02 DIAGNOSIS — K5901 Slow transit constipation: Secondary | ICD-10-CM | POA: Diagnosis not present

## 2019-08-02 DIAGNOSIS — F0280 Dementia in other diseases classified elsewhere without behavioral disturbance: Secondary | ICD-10-CM | POA: Diagnosis not present

## 2019-08-02 DIAGNOSIS — I2782 Chronic pulmonary embolism: Secondary | ICD-10-CM | POA: Diagnosis not present

## 2019-08-02 DIAGNOSIS — S22080A Wedge compression fracture of T11-T12 vertebra, initial encounter for closed fracture: Secondary | ICD-10-CM | POA: Diagnosis not present

## 2019-08-02 DIAGNOSIS — Z515 Encounter for palliative care: Secondary | ICD-10-CM | POA: Diagnosis not present

## 2019-08-02 DIAGNOSIS — Z9181 History of falling: Secondary | ICD-10-CM | POA: Diagnosis not present

## 2019-08-02 DIAGNOSIS — R2681 Unsteadiness on feet: Secondary | ICD-10-CM | POA: Diagnosis not present

## 2019-08-02 DIAGNOSIS — D6859 Other primary thrombophilia: Secondary | ICD-10-CM | POA: Diagnosis not present

## 2019-08-02 DIAGNOSIS — R5381 Other malaise: Secondary | ICD-10-CM | POA: Diagnosis not present

## 2019-08-02 DIAGNOSIS — R278 Other lack of coordination: Secondary | ICD-10-CM | POA: Diagnosis not present

## 2019-08-02 DIAGNOSIS — R41841 Cognitive communication deficit: Secondary | ICD-10-CM | POA: Diagnosis not present

## 2019-08-02 DIAGNOSIS — G308 Other Alzheimer's disease: Secondary | ICD-10-CM | POA: Diagnosis not present

## 2019-08-02 DIAGNOSIS — I9589 Other hypotension: Secondary | ICD-10-CM | POA: Diagnosis not present

## 2019-08-02 DIAGNOSIS — S72001D Fracture of unspecified part of neck of right femur, subsequent encounter for closed fracture with routine healing: Secondary | ICD-10-CM | POA: Diagnosis not present

## 2019-08-02 DIAGNOSIS — R2689 Other abnormalities of gait and mobility: Secondary | ICD-10-CM | POA: Diagnosis not present

## 2019-08-02 DIAGNOSIS — Z7401 Bed confinement status: Secondary | ICD-10-CM | POA: Diagnosis not present

## 2019-08-02 DIAGNOSIS — R1312 Dysphagia, oropharyngeal phase: Secondary | ICD-10-CM | POA: Diagnosis not present

## 2019-08-02 DIAGNOSIS — Z4789 Encounter for other orthopedic aftercare: Secondary | ICD-10-CM | POA: Diagnosis not present

## 2019-08-02 DIAGNOSIS — Z471 Aftercare following joint replacement surgery: Secondary | ICD-10-CM | POA: Diagnosis not present

## 2019-08-02 DIAGNOSIS — S72031D Displaced midcervical fracture of right femur, subsequent encounter for closed fracture with routine healing: Secondary | ICD-10-CM | POA: Diagnosis not present

## 2019-08-02 DIAGNOSIS — S22080D Wedge compression fracture of T11-T12 vertebra, subsequent encounter for fracture with routine healing: Secondary | ICD-10-CM | POA: Diagnosis not present

## 2019-08-02 DIAGNOSIS — R4182 Altered mental status, unspecified: Secondary | ICD-10-CM | POA: Diagnosis not present

## 2019-08-02 DIAGNOSIS — R262 Difficulty in walking, not elsewhere classified: Secondary | ICD-10-CM | POA: Diagnosis not present

## 2019-08-02 DIAGNOSIS — M6281 Muscle weakness (generalized): Secondary | ICD-10-CM | POA: Diagnosis not present

## 2019-08-02 DIAGNOSIS — F1120 Opioid dependence, uncomplicated: Secondary | ICD-10-CM | POA: Diagnosis not present

## 2019-08-02 DIAGNOSIS — M479 Spondylosis, unspecified: Secondary | ICD-10-CM | POA: Diagnosis not present

## 2019-08-02 DIAGNOSIS — F29 Unspecified psychosis not due to a substance or known physiological condition: Secondary | ICD-10-CM | POA: Diagnosis not present

## 2019-08-02 DIAGNOSIS — D6489 Other specified anemias: Secondary | ICD-10-CM | POA: Diagnosis not present

## 2019-08-02 DIAGNOSIS — S72001A Fracture of unspecified part of neck of right femur, initial encounter for closed fracture: Secondary | ICD-10-CM | POA: Diagnosis not present

## 2019-08-02 DIAGNOSIS — M2550 Pain in unspecified joint: Secondary | ICD-10-CM | POA: Diagnosis not present

## 2019-08-02 LAB — CBC
HCT: 25.4 % — ABNORMAL LOW (ref 39.0–52.0)
Hemoglobin: 8 g/dL — ABNORMAL LOW (ref 13.0–17.0)
MCH: 28.4 pg (ref 26.0–34.0)
MCHC: 31.5 g/dL (ref 30.0–36.0)
MCV: 90.1 fL (ref 80.0–100.0)
Platelets: 271 10*3/uL (ref 150–400)
RBC: 2.82 MIL/uL — ABNORMAL LOW (ref 4.22–5.81)
RDW: 15.6 % — ABNORMAL HIGH (ref 11.5–15.5)
WBC: 9.7 10*3/uL (ref 4.0–10.5)
nRBC: 0 % (ref 0.0–0.2)

## 2019-08-02 MED ORDER — HYDROCODONE-ACETAMINOPHEN 7.5-325 MG PO TABS: 1.0000 | ORAL_TABLET | Freq: Four times a day (QID) | ORAL | 0 refills | Status: AC | PRN

## 2019-08-02 MED ORDER — HYDROCODONE-ACETAMINOPHEN 7.5-325 MG PO TABS: 1.0000 | ORAL_TABLET | Freq: Four times a day (QID) | ORAL | 0 refills | Status: DC | PRN

## 2019-08-02 MED ORDER — XARELTO 20 MG PO TABS: 20.0000 mg | ORAL_TABLET | Freq: Every day | ORAL | Status: DC

## 2019-08-02 MED ORDER — HYDROCODONE-ACETAMINOPHEN 7.5-325 MG PO TABS: 1.0000 | ORAL_TABLET | Freq: Two times a day (BID) | ORAL | 0 refills | Status: AC

## 2019-08-02 MED ORDER — HYDROCODONE-ACETAMINOPHEN 5-325 MG PO TABS: 1.0000 | ORAL_TABLET | Freq: Two times a day (BID) | ORAL | 0 refills | Status: DC

## 2019-08-02 MED ORDER — HYDROCODONE-ACETAMINOPHEN 7.5-325 MG PO TABS: 1.0000 | ORAL_TABLET | Freq: Two times a day (BID) | ORAL | 0 refills | Status: DC

## 2019-08-02 NOTE — TOC Transition Note (Signed)
Transition of Care [facility]) - CM/SW Discharge Note   Patient Details  Name: Jordan Long. MRN: [id number] Date of Birth: [birth year]  Transition of Care [facility]) CM/SW Contact:  Curlene Labrum, RN Phone Number: 08/02/2019, 11:37 AM   Clinical Narrative:    The patient is to be transferred to [facility] at 2 pm today.  I spoke with the patient's sister and notified her.  PTAR was arranged for 2 pm today for transfer.  The primary nurse is to call for report Room 102P [phone].   Final next level of care: Skilled Nursing Facility Barriers to Discharge: Continued Medical Work up, SNF Pending bed offer   Patient Goals and CMS Choice Patient states their goals for this hospitalization and ongoing recovery are:: The patient's sister would like to get better and go to a SNF before returning to the memory unit. CMS Medicare.gov Compare Post Acute Care list provided to:: Patient Represenative (must comment) (Patient's sister, Jordan Long) Choice offered to / list presented to : [facility] POA / Guardian Jordan Long, sister)  Discharge Placement                       Discharge Plan and Services   Discharge Planning Services: CM Consult Post Acute Care Choice: [place]                               Social Determinants of Health (SDOH) Interventions     Readmission Risk Interventions No flowsheet data found.

## 2019-08-02 NOTE — Progress Notes (Signed)
Called [facility], report given to [facility] LPN. Awaiting transportation.

## 2019-08-02 NOTE — Discharge Instructions (Addendum)
Dr. Rod Can Joint Replacement Specialist [facility] [address] [place], [place] [address] [phone]   TOTAL HIP REPLACEMENT POSTOPERATIVE DIRECTIONS    Hip Rehabilitation, Guidelines Following Surgery   WEIGHT BEARING Weight bearing as tolerated with assist device (walker, cane, etc) as directed, use it as long as suggested by your surgeon or therapist, typically at least 4-6 weeks.  The results of a hip operation are greatly improved after range of motion and muscle strengthening exercises. Follow all safety measures which are given to protect your hip. If any of these exercises cause increased pain or swelling in your joint, decrease the amount until you are comfortable again. Then slowly increase the exercises. Call your caregiver if you have problems or questions.   HOME CARE INSTRUCTIONS  Most of the following instructions are designed to prevent the dislocation of your new hip.  Remove items at home which could result in a fall. This includes throw rugs or furniture in walking pathways.  Continue medications as instructed at time of discharge.  You may have some home medications which will be placed on hold until you complete the course of blood thinner medication.  You may start showering once you are discharged home. Do not remove your dressing. Do not put on socks or shoes without following the instructions of your caregivers.   Sit on chairs with arms. Use the chair arms to help push yourself up when arising.  Arrange for the use of a toilet seat elevator so you are not sitting low.   Walk with walker as instructed.  You may resume a sexual relationship in one month or when given the OK by your caregiver.  Use walker as long as suggested by your caregivers.  You may put full weight on your legs and walk as much as is comfortable. Avoid periods of inactivity such as sitting longer than an hour when not asleep. This helps prevent  blood clots.  You may return to work once you are cleared by Engineer, production.  Do not drive a car for 6 weeks or until released by your surgeon.  Do not drive while taking narcotics.  Wear elastic stockings for two weeks following surgery during the day but you may remove then at night.  Make sure you keep all of your appointments after your operation with all of your doctors and caregivers. You should call the office at the above phone number and make an appointment for approximately two weeks after the date of your surgery. Please pick up a stool softener and laxative for home use as long as you are requiring pain medications.  ICE to the affected hip every three hours for 30 minutes at a time and then as needed for pain and swelling. Continue to use ice on the hip for pain and swelling from surgery. You may notice swelling that will progress down to the foot and ankle.  This is normal after surgery.  Elevate the leg when you are not up walking on it.   It is important for you to complete the blood thinner medication as prescribed by your doctor.  Continue to use the breathing machine which will help keep your temperature down.  It is common for your temperature to cycle up and down following surgery, especially at night when you are not up moving around and exerting yourself.  The breathing machine keeps your lungs expanded and your temperature down.  RANGE OF MOTION AND STRENGTHENING EXERCISES  These exercises  are designed to help you keep full movement of your hip joint. Follow your caregiver's or physical therapist's instructions. Perform all exercises about fifteen times, three times per day or as directed. Exercise both hips, even if you have had only one joint replacement. These exercises can be done on a training (exercise) mat, on the floor, on a table or on a bed. Use whatever works the best and is most comfortable for you. Use music or television while you are exercising so that the exercises  are a pleasant break in your day. This will make your life better with the exercises acting as a break in routine you can look forward to.  Lying on your back, slowly slide your foot toward your buttocks, raising your knee up off the floor. Then slowly slide your foot back down until your leg is straight again.  Lying on your back spread your legs as far apart as you can without causing discomfort.  Lying on your side, raise your upper leg and foot straight up from the floor as far as is comfortable. Slowly lower the leg and repeat.  Lying on your back, tighten up the muscle in the front of your thigh (quadriceps muscles). You can do this by keeping your leg straight and trying to raise your heel off the floor. This helps strengthen the largest muscle supporting your knee.  Lying on your back, tighten up the muscles of your buttocks both with the legs straight and with the knee bent at a comfortable angle while keeping your heel on the floor.   SKILLED REHAB INSTRUCTIONS: If the patient is transferred to a skilled rehab facility following release from the hospital, a list of the current medications will be sent to the facility for the patient to continue.  When discharged from the skilled rehab facility, please have the facility set up the patient's [place] prior to being released. Also, the skilled facility will be responsible for providing the patient with their medications at time of release from the facility to include their pain medication and their blood thinner medication. If the patient is still at the rehab facility at time of the two week follow up appointment, the skilled rehab facility will also need to assist the patient in arranging follow up appointment in our office and any transportation needs.  MAKE SURE YOU:  Understand these instructions.  Will watch your condition.  Will get help right away if you are not doing well or get worse.  Pick up stool softner and  laxative for home use following surgery while on pain medications. Do not remove your dressing. The dressing is waterproof--it is OK to take showers. Continue to use ice for pain and swelling after surgery. Do not use any lotions or creams on the incision until instructed by your surgeon. Total Hip Protocol.    Information on my medicine - XARELTO (rivaroxaban)  WHY WAS XARELTO PRESCRIBED FOR YOU? Xarelto was prescribed to treat blood clots that may have been found in the veins of your legs (deep vein thrombosis) or in your lungs (pulmonary embolism) and to reduce the risk of them occurring again.  What do you need to know about Xarelto? The dose is  one 20 mg tablet taken ONCE A DAY with your evening meal.  DO NOT stop taking Xarelto without talking to the health care provider who prescribed the medication.  Refill your prescription for 20 mg tablets before you run out.  After discharge, you should  have regular check-up appointments with your healthcare provider that is prescribing your Xarelto.  In the future your dose may need to be changed if your kidney function changes by a significant amount.  What do you do if you miss a dose? If you are taking Xarelto TWICE DAILY and you miss a dose, take it as soon as you remember. You may take two 15 mg tablets (total 30 mg) at the same time then resume your regularly scheduled 15 mg twice daily the next day.  If you are taking Xarelto ONCE DAILY and you miss a dose, take it as soon as you remember on the same day then continue your regularly scheduled once daily regimen the next day. Do not take two doses of Xarelto at the same time.   Important Safety Information Xarelto is a blood thinner medicine that can cause bleeding. You should call your healthcare provider right away if you experience any of the following: ? Bleeding from an injury or your nose that does not stop. ? Unusual colored urine (red or dark brown) or unusual  colored stools (red or black). ? Unusual bruising for unknown reasons. ? A serious fall or if you hit your head (even if there is no bleeding).  Some medicines may interact with Xarelto and might increase your risk of bleeding while on Xarelto. To help avoid this, consult your healthcare provider or pharmacist prior to using any new prescription or non-prescription medications, including herbals, vitamins, non-steroidal anti-inflammatory drugs (NSAIDs) and supplements.  This website has more information on Xarelto: [email or web address]  Mr. Mccue,  It was a pleasure working with you during your hospital stay. During your stay your hip was repaired by the orthopaedic team. Please continue to take the Norco 7.5-325 mg tablet for your pain before transitioning back to your home dose of Norco 5-325 mg tablet. Please follow up with your orthopaedic appointment. Please schedule an appointment with your primary care physician as well for a follow up hospital visit.

## 2019-08-02 NOTE — Progress Notes (Signed)
Internal Medicine Attending:   I saw and examined the patient. I reviewed the resident's note and I agree with the resident's findings and plan as documented in the resident's note.  Patient denies any complaints today and states that he feels well.  Patient was initially admitted to the hospital with right displaced femoral neck fracture secondary to fall.  Patient is status post right THR.  We will restart his home dose of Xarelto tomorrow (currently on Xarelto 10 mg daily).  We will continue pain control with Norco.  PT/OT follow-up and recommendations appreciated.  Patient is stable for DC to SNF today.  No further work-up at this time.  Of note, patient's hemoglobin did decrease from 11.8 on admission to 7.6 through the course of his hospitalization.  This is likely secondary to acute blood loss anemia from his fracture as well as the THR.  No further work-up at this time.  Patient hemoglobin is stable at 8 today.

## 2019-08-02 NOTE — Progress Notes (Signed)
   Subjective:  O/N Events: None   Patient is resting comfortably in bed. He denies any chest pain, shortness of breath , or abdominal pain. He seem more alert today, but still responding to questions with 1 word answers. He denies any leg pain and is agree able to SNF placement.   Objective:  Vital signs in last 24 hours: Vitals:   08/01/19 1322 08/01/19 2010 08/02/19 0413 08/02/19 0745  BP: 109/63 113/68 104/68 113/74  Pulse: 65 88 80 80  Resp: 16 16 18 17   Temp: 98.5 F (36.9 C) 99 F (37.2 C) 98 F (36.7 C) 98.3 F (36.8 C)  TempSrc: Oral Oral Oral Oral  SpO2: 98% 100% 100% 92%  Weight:   74.1 kg   Height:        Physical Exam Constitutional:      Appearance: Normal appearance.  Cardiovascular:     Rate and Rhythm: Normal rate and regular rhythm.     Pulses: Normal pulses.     Heart sounds: Normal heart sounds. No murmur heard.  No friction rub. No gallop.   Pulmonary:     Effort: Pulmonary effort is normal.     Breath sounds: Normal breath sounds. No wheezing, rhonchi or rales.  Abdominal:     General: Abdomen is flat. Bowel sounds are normal.     Palpations: Abdomen is soft.     Tenderness: There is no abdominal tenderness. There is no guarding.  Neurological:     Mental Status: He is alert.      LABS:  CBC Latest Ref Rng & Units 08/02/2019 08/01/2019 07/31/2019  WBC 4.0 - 10.5 K/uL 9.7 8.3 10.4  Hemoglobin 13.0 - 17.0 g/dL 8.0(L) 7.6(L) 8.8(L)  Hematocrit 39 - 52 % 25.4(L) 24.1(L) 28.0(L)  Platelets 150 - 400 K/uL 271 249 245    CMP Latest Ref Rng & Units 07/31/2019 07/30/2019 07/29/2019  Glucose 70 - 99 mg/dL 140(H) 104(H) 145(H)  BUN 8 - 23 mg/dL 22 17 24(H)  Creatinine 0.61 - 1.24 mg/dL 1.11 0.99 1.15  Sodium 135 - 145 mmol/L 137 137 137  Potassium 3.5 - 5.1 mmol/L 4.2 3.8 3.8  Chloride 98 - 111 mmol/L 104 104 104  CO2 22 - 32 mmol/L 25 25 24   Calcium 8.9 - 10.3 mg/dL 8.5(L) 8.8(L) 9.1  Total Protein 6.5 - 8.1 g/dL - 6.0(L) 6.8  Total Bilirubin 0.3  - 1.2 mg/dL - 0.8 0.6  Alkaline Phos 38 - 126 U/L - 118 150(H)  AST 15 - 41 U/L - 19 21  ALT 0 - 44 U/L - 18 20     Assessment/Plan:  Active Problems:   Fracture   Fall   Right hip pain  Jordan Long is a 69 y/o male, with a PMH of dementia, 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 Femoral fracture.   Right Displaced Femoral Neck Fracture 2/2 Fall:  - Completes Xarelto 10 mg, will restart home dose August 03, 2019.  - SCDs - schedule hydrocodone 7.5-325 mg (gets 5-325 q12h scheduled at his facility) and tylenol  - fentanyl prn severe pain  - Hemoglobin 8.0  - PT recommending SNF   Alzheimer's Dementia:   - Continue donepezil, cymbalta, risperidone  Dispo: Patient stable for DC anticipated discharge pending SNF placement.   Maudie Mercury, MD 08/02/2019, 11:10 AM Pager: [phone] After 5pm on weekdays and 1pm on weekends: On Call pager [phone]

## 2019-08-02 NOTE — Discharge Summary (Signed)
Name: Jordan Long [facility]. MRN: [id number] DOB: 1950/09/27 69 y.o. PCP: Axel Filler, MD  Date of Admission: 07/28/2019 11:54 PM Date of Discharge: 08/02/2019 Attending Physician: Aldine Contes, MD  Discharge Diagnosis: 1. Right Displaced Femoral Neck Fracture 2/2 Fall 2. Alzheimer's Dementia   Discharge Medications: Allergies as of 08/02/2019   No Known Allergies     Medication List    TAKE these medications   acetaminophen 325 MG tablet Commonly known as: TYLENOL Take 2 tablets (650 mg total) by mouth every 6 (six) hours as needed.   CeraVe Lotn Apply two times per day as needed for itching and dry skin. What changed:   how to take this  when to take this  additional instructions   donepezil 10 MG tablet Commonly known as: ARICEPT Take 1 tablet (10 mg total) by mouth at bedtime.   DULoxetine 60 MG capsule Commonly known as: CYMBALTA Take 1 capsule (60 mg total) by mouth daily.   feeding supplement (ENSURE ENLIVE) Liqd Take 237 mLs by mouth 2 (two) times daily between meals.   HYDROcodone-acetaminophen 7.5-325 MG tablet Commonly known as: NORCO Take 1 tablet by mouth 2 (two) times daily for 5 days. What changed: You were already taking a medication with the same name, and this prescription was added. Make sure you understand how and when to take each.   HYDROcodone-acetaminophen 5-325 MG tablet Commonly known as: Norco Take 1 tablet by mouth every 12 (twelve) hours. What changed: Another medication with the same name was added. Make sure you understand how and when to take each.   morphine 10 MG/5ML solution Take 2.5 mLs (5 mg total) by mouth every 6 (six) hours as needed for severe pain.   Multivitamin Adult Chew Chew 1 tablet by mouth daily.   omeprazole 20 MG capsule Commonly known as: PRILOSEC Take 1 capsule (20 mg total) by mouth daily.   polyethylene glycol 17 g packet Commonly known as: MIRALAX / GLYCOLAX Take 17 g by  mouth daily as needed for moderate constipation or severe constipation.   risperiDONE 0.25 MG tablet Commonly known as: RISPERDAL Take 0.25 mg by mouth daily.   Xarelto 20 MG Tabs tablet Generic drug: rivaroxaban Take 1 tablet (20 mg total) by mouth daily. Start taking on: August 03, 2019            Discharge Care Instructions  (From admission, onward)         Start     Ordered   08/02/19 0000  Leave dressing on - Keep it clean, dry, and intact until clinic visit        08/02/19 1105          Disposition and follow-up:   [name]. was discharged from [facility] in Stable condition.  At the hospital follow up visit please address:  1. Right Displaced Femoral Neck Fracture 2/2 Fall  - Follow up Orthopaedic appointment  - Wound Reassessment  - Continue Xarelto 20 mg Daily    2. Alzheimer's Dementia   - Continue Risperdal, Cymbalta, Donepezil     Labs / imaging needed at time of follow-up: None   Pending labs/ test needing follow-up: None   Follow-up Appointments:  Follow-up Information    Swinteck, Aaron Edelman, MD. Schedule an appointment as soon as possible for a visit in 2 week(s).   Specialty: Orthopedic Surgery Why: For wound re-check Contact information: [address] [place] [address] [phone]  Axel Filler, MD. Schedule an appointment as soon as possible for a visit.   Specialty: Internal Medicine Why: Please make an appointment for a hospital follow up. Contact information: [place] [address] [place] [facility] Course by problem list: 1. Right Displaced Femoral Neck Fracture 2/2 Fall Patient presented from his memory care unit after a fall. His imaging showed right femoral neck fracture. Orthopaedics was consulted and he underwent a right hip hemiarthroplasty. Patient tolerated the procedure well and was discharged in stable  condition to SNF for rehabilitation.   2. Alzheimer's Dementia  Patient with Alzheimer's dementia on donepezil, Cymbalta, and risperidone for aggression. His risperidone was decreased at his last PCP visit as a taper. He was continued on his medications during his stay. He will be discharged with his medications.    Discharge Vitals:   BP 113/74 (BP Location: Right Arm)   Pulse 80   Temp 98.3 F (36.8 C) (Oral)   Resp 17   Ht 5\' 7"  (1.702 m)   Wt 74.1 kg   SpO2 92%   BMI 25.59 kg/m   Pertinent Labs, Studies, and Procedures:  EXAM: PORTABLE CHEST 1 VIEW  COMPARISON:  06/18/2019  FINDINGS: Single frontal view of the chest demonstrates an unremarkable cardiac silhouette. No airspace disease, effusion, or pneumothorax. No acute displaced fractures.  IMPRESSION: 1. No acute intrathoracic process.  FINDINGS: Supine frontal view of the pelvis demonstrates and impacted subcapital right femoral neck fracture with varus angulation. No dislocation.  No other acute displaced fractures. Symmetrical bilateral hip osteoarthritis. Stable lower lumbar spondylosis. 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 This is a stable finding.  IMPRESSION: 1. Acute subcapital right femoral neck fracture with impaction and varus angulation.  COMPARISON:  CT head 06/18/2019 of the CT head and cervical spine 01/17/2019  FINDINGS: CT HEAD FINDINGS  Brain: No evidence of acute infarction, hemorrhage, hydrocephalus, extra-axial collection or mass lesion/mass effect. Symmetric prominence of the ventricles, cisterns and sulci compatible with parenchymal volume loss. Patchy areas of white matter hypoattenuation are most compatible with chronic microvascular angiopathy.  Vascular: No hyperdense vessel or unexpected calcification.  Skull: No calvarial fracture or suspicious osseous lesion. No scalp swelling or  hematoma.  Sinuses/Orbits: Minimal thickening in the dependent portion right maxillary sinus. Otherwise paranasal sinuses and mastoid air cells are predominantly clear. Included orbital structures are unremarkable.  Other: Debris in the right external auditory canal. Dental bridge in place. Multitude of absent dentition.  IMPRESSION: 1. No evidence of acute intracranial abnormality. No scalp swelling or calvarial fracture.  CT CERVICAL SPINE WITHOUT CONTRAST  TECHNIQUE: Multidetector CT imaging of the cervical spine was performed without intravenous contrast. Multiplanar CT image reconstructions were also generated.  COMPARISON:  01/17/2019  FINDINGS: Alignment: Alignment is anatomic and stable.  Skull base and vertebrae: No acute displaced fractures.  Soft tissues and spinal canal: No prevertebral fluid or swelling. No visible canal hematoma.  Disc levels: Stable lower cervical spondylosis most pronounced at C5-6 and C6-7. There is symmetrical neural foraminal encroachment at these levels. There is prominent facet hypertrophy throughout the cervical spine, slightly asymmetric to the right. This is most pronounced from C2 through C5. This results and right predominant neural foraminal narrowing at C3-4 and C4-5.  Upper chest: Airway is patent.  Lung apices are clear.  Other: Reconstructed images demonstrate no  additional findings.  IMPRESSION: 1. Extensive multilevel spondylosis and facet hypertrophy, stable. 2. No acute cervical spine fractures.  CT THORACIC SPINE FINDINGS  Alignment: Normal.  Increased kyphosis at the T11 level.  Vertebrae: There is an age-indeterminate compression fracture of T12 approximately 10% height loss. No retropulsion. There is a chronic T11 compression deformity.  Paraspinal and other soft tissues: Negative.  Disc levels: No spinal canal stenosis.  CT LUMBAR SPINE FINDINGS  Segmentation: Standard  Alignment:  Grade 2 anterolisthesis at L5-S1 with bilateral pars interarticularis defects.  Vertebrae: No acute fracture  Paraspinal and other soft tissues: Calcific aortic atherosclerosis.  Disc levels:  L1-2: No spinal canal stenosis.  Moderate facet hypertrophy.  L2-3: Moderate facet hypertrophy. Diffuse disc bulge. Mild spinal canal stenosis.  L3-4: Mild facet hypertrophy. Diffuse disc bulge. No spinal stenosis.  L4-5: Severe facet hypertrophy with intermediate disc bulge. Mild spinal canal stenosis. Moderate bilateral foraminal stenosis.  L5-S1: Grade 2 anterolisthesis secondary to pars interarticularis defects. Severe bilateral foraminal stenosis. Moderate spinal canal stenosis.  IMPRESSION: 1. Age-indeterminate compression fracture of T12 with approximately 10% height loss and no retropulsion. Thoracic spine MRI may provide better temporal characterization. 2. Chronic T11 compression deformity. 3. Grade 2 anterolisthesis at L5-S1 secondary to bilateral pars interarticularis defects. Severe bilateral foraminal stenosis and moderate spinal canal stenosis. 4. Mild spinal canal stenosis at L2-3 and L4-5. Moderate bilateral L4 neural foraminal stenosis. 5. Aortic Atherosclerosis (ICD10-I70.0).   CT LUMBAR SPINE FINDINGS  Segmentation: Standard  Alignment: Grade 2 anterolisthesis at L5-S1 with bilateral pars interarticularis defects.  Vertebrae: No acute fracture  Paraspinal and other soft tissues: Calcific aortic atherosclerosis.  Disc levels:  L1-2: No spinal canal stenosis.  Moderate facet hypertrophy.  L2-3: Moderate facet hypertrophy. Diffuse disc bulge. Mild spinal canal stenosis.  L3-4: Mild facet hypertrophy. Diffuse disc bulge. No spinal stenosis.  L4-5: Severe facet hypertrophy with intermediate disc bulge. Mild spinal canal stenosis. Moderate bilateral foraminal stenosis.  L5-S1: Grade 2 anterolisthesis secondary to pars  interarticularis defects. Severe bilateral foraminal stenosis. Moderate spinal canal stenosis.  IMPRESSION: 1. Age-indeterminate compression fracture of T12 with approximately 10% height loss and no retropulsion. Thoracic spine MRI may provide better temporal characterization. 2. Chronic T11 compression deformity. 3. Grade 2 anterolisthesis at L5-S1 secondary to bilateral pars interarticularis defects. Severe bilateral foraminal stenosis and moderate spinal canal stenosis. 4. Mild spinal canal stenosis at L2-3 and L4-5. Moderate bilateral L4 neural foraminal stenosis. 5. Aortic Atherosclerosis (ICD10-I70.0).  CBC Latest Ref Rng & Units 08/02/2019 08/01/2019 07/31/2019  WBC 4.0 - 10.5 K/uL 9.7 8.3 10.4  Hemoglobin 13.0 - 17.0 g/dL 8.0(L) 7.6(L) 8.8(L)  Hematocrit 39 - 52 % 25.4(L) 24.1(L) 28.0(L)  Platelets 150 - 400 K/uL 271 249 245    BMP Latest Ref Rng & Units 07/31/2019 07/30/2019 07/29/2019  Glucose 70 - 99 mg/dL 140(H) 104(H) 145(H)  BUN 8 - 23 mg/dL 22 17 24(H)  Creatinine 0.61 - 1.24 mg/dL 1.11 0.99 1.15  BUN/Creat Ratio 10 - 24 - - -  Sodium 135 - 145 mmol/L 137 137 137  Potassium 3.5 - 5.1 mmol/L 4.2 3.8 3.8  Chloride 98 - 111 mmol/L 104 104 104  CO2 22 - 32 mmol/L 25 25 24   Calcium 8.9 - 10.3 mg/dL 8.5(L) 8.8(L) 9.1    Discharge Instructions: Discharge Instructions    Call MD for:  persistant nausea and vomiting   Complete by: As directed    Call MD for:  redness, tenderness, or signs of infection (pain, swelling, redness,  odor or green/yellow discharge around incision site)   Complete by: As directed    Call MD for:  severe uncontrolled pain   Complete by: As directed    Call MD for:  temperature >100.4   Complete by: As directed    Diet - low sodium heart healthy   Complete by: As directed    Increase activity slowly   Complete by: As directed    Leave dressing on - Keep it clean, dry, and intact until clinic visit   Complete by: As directed        Signed: Maudie Mercury, MD 08/02/2019, 11:05 AM   Pager: [phone]

## 2019-08-02 NOTE — Progress Notes (Signed)
Patient in hospital, was discharged to SNF with Norco but prescription was not printed. Printing out prescription to send to facility. Norco 7.5-325 mg BID for 3 days, then Norco 5-325 BID after that.

## 2019-08-03 DIAGNOSIS — Z515 Encounter for palliative care: Secondary | ICD-10-CM | POA: Diagnosis not present

## 2019-08-03 DIAGNOSIS — Z4789 Encounter for other orthopedic aftercare: Secondary | ICD-10-CM | POA: Diagnosis not present

## 2019-08-03 DIAGNOSIS — R262 Difficulty in walking, not elsewhere classified: Secondary | ICD-10-CM | POA: Diagnosis not present

## 2019-08-03 DIAGNOSIS — D6489 Other specified anemias: Secondary | ICD-10-CM | POA: Diagnosis not present

## 2019-08-03 DIAGNOSIS — R2689 Other abnormalities of gait and mobility: Secondary | ICD-10-CM | POA: Diagnosis not present

## 2019-08-03 DIAGNOSIS — R1312 Dysphagia, oropharyngeal phase: Secondary | ICD-10-CM | POA: Diagnosis not present

## 2019-08-03 DIAGNOSIS — D6859 Other primary thrombophilia: Secondary | ICD-10-CM | POA: Diagnosis not present

## 2019-08-03 DIAGNOSIS — S72001A Fracture of unspecified part of neck of right femur, initial encounter for closed fracture: Secondary | ICD-10-CM | POA: Diagnosis not present

## 2019-08-03 DIAGNOSIS — S72031D Displaced midcervical fracture of right femur, subsequent encounter for closed fracture with routine healing: Secondary | ICD-10-CM | POA: Diagnosis not present

## 2019-08-03 DIAGNOSIS — S22080A Wedge compression fracture of T11-T12 vertebra, initial encounter for closed fracture: Secondary | ICD-10-CM | POA: Diagnosis not present

## 2019-08-03 DIAGNOSIS — K5901 Slow transit constipation: Secondary | ICD-10-CM | POA: Diagnosis not present

## 2019-08-03 DIAGNOSIS — Z9181 History of falling: Secondary | ICD-10-CM | POA: Diagnosis not present

## 2019-08-03 DIAGNOSIS — R2681 Unsteadiness on feet: Secondary | ICD-10-CM | POA: Diagnosis not present

## 2019-08-03 DIAGNOSIS — I2782 Chronic pulmonary embolism: Secondary | ICD-10-CM | POA: Diagnosis not present

## 2019-08-03 DIAGNOSIS — S22080D Wedge compression fracture of T11-T12 vertebra, subsequent encounter for fracture with routine healing: Secondary | ICD-10-CM | POA: Diagnosis not present

## 2019-08-03 DIAGNOSIS — F0280 Dementia in other diseases classified elsewhere without behavioral disturbance: Secondary | ICD-10-CM | POA: Diagnosis not present

## 2019-08-03 DIAGNOSIS — S72001D Fracture of unspecified part of neck of right femur, subsequent encounter for closed fracture with routine healing: Secondary | ICD-10-CM | POA: Diagnosis not present

## 2019-08-03 DIAGNOSIS — I9589 Other hypotension: Secondary | ICD-10-CM | POA: Diagnosis not present

## 2019-08-03 DIAGNOSIS — Z471 Aftercare following joint replacement surgery: Secondary | ICD-10-CM | POA: Diagnosis not present

## 2019-08-03 DIAGNOSIS — F1120 Opioid dependence, uncomplicated: Secondary | ICD-10-CM | POA: Diagnosis not present

## 2019-08-03 DIAGNOSIS — M6281 Muscle weakness (generalized): Secondary | ICD-10-CM | POA: Diagnosis not present

## 2019-08-03 DIAGNOSIS — G308 Other Alzheimer's disease: Secondary | ICD-10-CM | POA: Diagnosis not present

## 2019-08-03 DIAGNOSIS — M479 Spondylosis, unspecified: Secondary | ICD-10-CM | POA: Diagnosis not present

## 2019-08-03 DIAGNOSIS — R41841 Cognitive communication deficit: Secondary | ICD-10-CM | POA: Diagnosis not present

## 2019-08-10 DIAGNOSIS — I9589 Other hypotension: Secondary | ICD-10-CM | POA: Diagnosis not present

## 2019-08-10 DIAGNOSIS — S72001D Fracture of unspecified part of neck of right femur, subsequent encounter for closed fracture with routine healing: Secondary | ICD-10-CM | POA: Diagnosis not present

## 2019-08-15 DIAGNOSIS — S72031D Displaced midcervical fracture of right femur, subsequent encounter for closed fracture with routine healing: Secondary | ICD-10-CM | POA: Diagnosis not present

## 2019-08-17 DIAGNOSIS — K5901 Slow transit constipation: Secondary | ICD-10-CM | POA: Diagnosis not present

## 2019-08-17 DIAGNOSIS — F0280 Dementia in other diseases classified elsewhere without behavioral disturbance: Secondary | ICD-10-CM | POA: Diagnosis not present

## 2019-08-17 DIAGNOSIS — R262 Difficulty in walking, not elsewhere classified: Secondary | ICD-10-CM | POA: Diagnosis not present

## 2019-08-17 DIAGNOSIS — I2782 Chronic pulmonary embolism: Secondary | ICD-10-CM | POA: Diagnosis not present

## 2019-08-17 DIAGNOSIS — G308 Other Alzheimer's disease: Secondary | ICD-10-CM | POA: Diagnosis not present

## 2019-08-21 DIAGNOSIS — F0280 Dementia in other diseases classified elsewhere without behavioral disturbance: Secondary | ICD-10-CM | POA: Diagnosis not present

## 2019-08-21 DIAGNOSIS — Z515 Encounter for palliative care: Secondary | ICD-10-CM | POA: Diagnosis not present

## 2019-08-21 DIAGNOSIS — G308 Other Alzheimer's disease: Secondary | ICD-10-CM | POA: Diagnosis not present

## 2019-08-21 DIAGNOSIS — S72001D Fracture of unspecified part of neck of right femur, subsequent encounter for closed fracture with routine healing: Secondary | ICD-10-CM | POA: Diagnosis not present

## 2019-08-21 DIAGNOSIS — D6859 Other primary thrombophilia: Secondary | ICD-10-CM | POA: Diagnosis not present

## 2019-08-21 DIAGNOSIS — S22080D Wedge compression fracture of T11-T12 vertebra, subsequent encounter for fracture with routine healing: Secondary | ICD-10-CM | POA: Diagnosis not present

## 2019-08-21 DIAGNOSIS — D6489 Other specified anemias: Secondary | ICD-10-CM | POA: Diagnosis not present

## 2019-08-23 ENCOUNTER — Telehealth: Payer: Self-pay | Admitting: *Deleted

## 2019-08-23 NOTE — Addendum Note (Signed)
Addended by: Jiles Harold on: 08/23/2019 02:24 PM   Modules accepted: Level of Service, SmartSet

## 2019-08-23 NOTE — Telephone Encounter (Signed)
Pt's sister and caregiver calls and would like for dr Evette Doffing to visit pt and review medications. She was informed that dr Evette Doffing is away and will be informed when he returns.

## 2019-08-23 NOTE — Progress Notes (Signed)
This encounter was created in error - please disregard.

## 2019-08-28 NOTE — Telephone Encounter (Signed)
Ok. Can you let [facility] know that I will come by tomorrow morning around 8:45 to check in on him?

## 2019-08-28 NOTE — Telephone Encounter (Signed)
yes

## 2019-08-28 NOTE — Telephone Encounter (Signed)
I might be able to do it Tuesday morning a little before 9. Is he still at [facility]?

## 2019-08-29 ENCOUNTER — Ambulatory Visit: Payer: Medicare HMO | Admitting: Student in an Organized Health Care Education/Training Program

## 2019-08-29 ENCOUNTER — Other Ambulatory Visit: Payer: Self-pay

## 2019-08-29 ENCOUNTER — Encounter: Payer: Self-pay | Admitting: Student in an Organized Health Care Education/Training Program

## 2019-08-29 DIAGNOSIS — F0280 Dementia in other diseases classified elsewhere without behavioral disturbance: Secondary | ICD-10-CM | POA: Diagnosis not present

## 2019-08-29 DIAGNOSIS — G309 Alzheimer's disease, unspecified: Secondary | ICD-10-CM

## 2019-08-29 MED ORDER — HYDROCODONE-ACETAMINOPHEN 5-325 MG PO TABS: 1.0000 | ORAL_TABLET | Freq: Two times a day (BID) | ORAL | 0 refills | Status: DC

## 2019-08-29 NOTE — Assessment & Plan Note (Signed)
Has carried a diagnosis of Alzheimer dementia since at least 2016 and has taken a significant decline in functioning over the last 6 months. Now with several falls leading to injury, some history of aggressive behavior requiring low dose risperidone for safety of staff and residents. We started home hospice services because of these declines about two months ago. Today he appears more stable, seems to be recovering from the hip fracture but is still very function limited.  I am more impressed by his slow gait and deactivated affect now than I have been in the past. I wonder if there is some overlap here with Parkins/Lewy Body or vascular dementia. I don't see many other features of parkinsons on exam right now, no tremor, no rigidity. Plan is to continue with supportive care at the memory unit and home hospice care to keep him comfortable. Hopefully he can improve his functioning a bit more with outpatient rehab at the memory unit. Will continue with hydrocodone twice daily for pain. [name] understands the hydrocodone is likely making him itch, but will continue for now as it seems to be helping with his pain at the end of the day. Fall precautions recommended. Continue with the very low dose risperidone for now given some ongoing and history of aggression, family understands the risks here. Continue with aricept.

## 2019-08-29 NOTE — Progress Notes (Signed)
   Assessment and Plan:  See Encounters tab for problem-based medical decision making.   __________________________________________________________  HPI:   Home visit for this 69 year old person living with advanced stage Alzheimer dementia. I was asked to see him by his sister, [name], for decreased functioning and some aggressive behavior while being changed. He fell and had a right hip fracture on 6/26 requiring surgical replacement at [facility]. He was discharged to subacute rehab at [facility] place and returned to the memory unit at [facility] last week. Staff reports that he is doing well there. He is mostly in the chair through the day, he declines using a walker and instead will walk using the hand rails on the walls or assistance but is very unsteady owing to a shuffling gait. He has not had any further falls yet. They are giving him Hydrocodone low dose twice daily for pain related to the surgery and longstanding osteoarthritis in his knees which seems to be causing him pain. He has been on home hospice care since early June which seems to be helpful. He has not required any liquid morphine. His appetite is adequate. [name] has no complaints today, he does not remember me as his doctor, he does not respond to my questions except for some brief "yes/no."   I spoke with [name] over the phone. She has visited him nearly daily. He is doing more scratching at his lower legs, sometimes causing skin breaks and bleeding. She has not noticed any aggressive behavior. She wants him to do more PT so he can have more mobility and better quality of life.   __________________________________________________________  Problem List: Patient Active Problem List   Diagnosis Date Noted  . Hypercoagulable state ([place]) 01/20/2016    Priority: High  . Alzheimer's dementia ([place]) 12/25/2013    Priority: High  . Physical deconditioning 04/21/2019    Priority: Medium  . Basal cell carcinoma (BCC) 12/27/2017     Priority: Medium  . Osteoarthritis of knees, bilateral 07/13/2011    Priority: Medium  . Generalized anxiety disorder 03/02/2011    Priority: Medium  . Chronic venous insufficiency 06/04/2015    Priority: Low  . Dyslipidemia 06/13/2013    Priority: Low  . Preventative health care 12/09/2012    Priority: Low  . GERD (gastroesophageal reflux disease) 07/13/2011    Priority: Low    Medications: Reconciled today in Epic __________________________________________________________  Physical Exam:  Gen: Chronically ill appearing man.  Neck: No cervical LAD, No thyromegaly or nodules, No JVD. CV: RRR, no murmurs Pulm: Normal effort, CTA throughout, no wheezing Abd: Soft, NT, ND Ext: Warm, 1+ lower extremity edema around the ankles Skin: Mild excoriations on the lower extremity, no active bleeding, no erythema, no cellulitis or purulence. Neuro: No alert, not oriented, awake spontaneously, follows simple commands, full strength in arms and legs bilaterally, no tremor, some mild rigidity in arms and legs but no cogwheel findings. Slow to stand from the chair, needs assistance for balance with upright stand, too unsteady to walk forward.

## 2019-08-30 ENCOUNTER — Telehealth: Payer: Self-pay

## 2019-08-30 NOTE — Telephone Encounter (Signed)
[name] with [facility] requesting VO for speech and skilled nursing, please call back.

## 2019-08-30 NOTE — Telephone Encounter (Signed)
rtc to amy, lm for rtc 

## 2019-08-30 NOTE — Telephone Encounter (Signed)
Yes, thank you.

## 2019-08-30 NOTE — Telephone Encounter (Signed)
Jordan Long rtc  VO given for eval, treat, observe and order for [facility] speech and [place] skilled nursing. Do you agree?

## 2019-09-12 ENCOUNTER — Telehealth: Payer: Self-pay | Admitting: Student in an Organized Health Care Education/Training Program

## 2019-09-12 NOTE — Telephone Encounter (Signed)
Lm for rtc 

## 2019-09-12 NOTE — Telephone Encounter (Signed)
Pls contact pt sister [phone]

## 2019-10-03 DIAGNOSIS — S72031D Displaced midcervical fracture of right femur, subsequent encounter for closed fracture with routine healing: Secondary | ICD-10-CM | POA: Diagnosis not present

## 2019-10-04 ENCOUNTER — Telehealth: Payer: Self-pay | Admitting: Student in an Organized Health Care Education/Training Program

## 2019-10-04 MED ORDER — HYDROCODONE-ACETAMINOPHEN 5-325 MG PO TABS: 1.0000 | ORAL_TABLET | Freq: Two times a day (BID) | ORAL | 0 refills | Status: DC

## 2019-10-04 NOTE — Telephone Encounter (Signed)
Refill  Request   HYDROcodone-acetaminophen ([place]) 5-325 MG tablet  [facility] [place], [place].

## 2019-10-23 ENCOUNTER — Emergency Department (HOSPITAL_COMMUNITY)

## 2019-10-23 ENCOUNTER — Emergency Department (HOSPITAL_COMMUNITY)
Admission: EM | Admit: 2019-10-23 | Discharge: 2019-10-23 | Disposition: A | Discharge status: Home / Self Care | Attending: Emergency Medicine | Admitting: Emergency Medicine

## 2019-10-23 ENCOUNTER — Other Ambulatory Visit: Payer: Self-pay

## 2019-10-23 ENCOUNTER — Telehealth: Payer: Self-pay | Admitting: Student in an Organized Health Care Education/Training Program

## 2019-10-23 DIAGNOSIS — Z7901 Long term (current) use of anticoagulants: Secondary | ICD-10-CM | POA: Insufficient documentation

## 2019-10-23 DIAGNOSIS — R52 Pain, unspecified: Secondary | ICD-10-CM | POA: Diagnosis not present

## 2019-10-23 DIAGNOSIS — Z96641 Presence of right artificial hip joint: Secondary | ICD-10-CM | POA: Insufficient documentation

## 2019-10-23 DIAGNOSIS — R262 Difficulty in walking, not elsewhere classified: Secondary | ICD-10-CM

## 2019-10-23 DIAGNOSIS — M542 Cervicalgia: Secondary | ICD-10-CM | POA: Diagnosis not present

## 2019-10-23 DIAGNOSIS — Z86718 Personal history of other venous thrombosis and embolism: Secondary | ICD-10-CM | POA: Diagnosis not present

## 2019-10-23 DIAGNOSIS — R001 Bradycardia, unspecified: Secondary | ICD-10-CM | POA: Diagnosis not present

## 2019-10-23 DIAGNOSIS — R1030 Lower abdominal pain, unspecified: Secondary | ICD-10-CM | POA: Insufficient documentation

## 2019-10-23 DIAGNOSIS — Z20822 Contact with and (suspected) exposure to covid-19: Secondary | ICD-10-CM | POA: Insufficient documentation

## 2019-10-23 DIAGNOSIS — F0390 Unspecified dementia without behavioral disturbance: Secondary | ICD-10-CM | POA: Insufficient documentation

## 2019-10-23 DIAGNOSIS — Z87891 Personal history of nicotine dependence: Secondary | ICD-10-CM | POA: Diagnosis not present

## 2019-10-23 DIAGNOSIS — R0989 Other specified symptoms and signs involving the circulatory and respiratory systems: Secondary | ICD-10-CM

## 2019-10-23 DIAGNOSIS — R519 Headache, unspecified: Secondary | ICD-10-CM | POA: Diagnosis not present

## 2019-10-23 DIAGNOSIS — R2689 Other abnormalities of gait and mobility: Secondary | ICD-10-CM

## 2019-10-23 DIAGNOSIS — R4182 Altered mental status, unspecified: Secondary | ICD-10-CM | POA: Diagnosis not present

## 2019-10-23 DIAGNOSIS — G309 Alzheimer's disease, unspecified: Secondary | ICD-10-CM

## 2019-10-23 DIAGNOSIS — I2699 Other pulmonary embolism without acute cor pulmonale: Secondary | ICD-10-CM | POA: Diagnosis not present

## 2019-10-23 DIAGNOSIS — Z85828 Personal history of other malignant neoplasm of skin: Secondary | ICD-10-CM | POA: Insufficient documentation

## 2019-10-23 DIAGNOSIS — R269 Unspecified abnormalities of gait and mobility: Secondary | ICD-10-CM | POA: Diagnosis not present

## 2019-10-23 DIAGNOSIS — S3991XA Unspecified injury of abdomen, initial encounter: Secondary | ICD-10-CM | POA: Diagnosis not present

## 2019-10-23 DIAGNOSIS — Z86711 Personal history of pulmonary embolism: Secondary | ICD-10-CM | POA: Diagnosis not present

## 2019-10-23 DIAGNOSIS — R296 Repeated falls: Secondary | ICD-10-CM

## 2019-10-23 DIAGNOSIS — W19XXXA Unspecified fall, initial encounter: Secondary | ICD-10-CM

## 2019-10-23 DIAGNOSIS — Z955 Presence of coronary angioplasty implant and graft: Secondary | ICD-10-CM | POA: Insufficient documentation

## 2019-10-23 DIAGNOSIS — R404 Transient alteration of awareness: Secondary | ICD-10-CM | POA: Diagnosis not present

## 2019-10-23 DIAGNOSIS — I743 Embolism and thrombosis of arteries of the lower extremities: Secondary | ICD-10-CM | POA: Diagnosis not present

## 2019-10-23 DIAGNOSIS — R279 Unspecified lack of coordination: Secondary | ICD-10-CM | POA: Diagnosis not present

## 2019-10-23 DIAGNOSIS — Z043 Encounter for examination and observation following other accident: Secondary | ICD-10-CM | POA: Diagnosis not present

## 2019-10-23 DIAGNOSIS — F0280 Dementia in other diseases classified elsewhere without behavioral disturbance: Secondary | ICD-10-CM

## 2019-10-23 DIAGNOSIS — R410 Disorientation, unspecified: Secondary | ICD-10-CM | POA: Diagnosis not present

## 2019-10-23 DIAGNOSIS — R0902 Hypoxemia: Secondary | ICD-10-CM | POA: Diagnosis not present

## 2019-10-23 DIAGNOSIS — Z743 Need for continuous supervision: Secondary | ICD-10-CM | POA: Diagnosis not present

## 2019-10-23 LAB — CBC WITH DIFFERENTIAL/PLATELET
Abs Immature Granulocytes: 0.01 10*3/uL (ref 0.00–0.07)
Basophils Absolute: 0 10*3/uL (ref 0.0–0.1)
Basophils Relative: 1 %
Eosinophils Absolute: 0.2 10*3/uL (ref 0.0–0.5)
Eosinophils Relative: 3 %
HCT: 35.3 % — ABNORMAL LOW (ref 39.0–52.0)
Hemoglobin: 10.8 g/dL — ABNORMAL LOW (ref 13.0–17.0)
Immature Granulocytes: 0 %
Lymphocytes Relative: 21 %
Lymphs Abs: 1.5 10*3/uL (ref 0.7–4.0)
MCH: 27.5 pg (ref 26.0–34.0)
MCHC: 30.6 g/dL (ref 30.0–36.0)
MCV: 89.8 fL (ref 80.0–100.0)
Monocytes Absolute: 0.8 10*3/uL (ref 0.1–1.0)
Monocytes Relative: 12 %
Neutro Abs: 4.5 10*3/uL (ref 1.7–7.7)
Neutrophils Relative %: 63 %
Platelets: 315 10*3/uL (ref 150–400)
RBC: 3.93 MIL/uL — ABNORMAL LOW (ref 4.22–5.81)
RDW: 14.4 % (ref 11.5–15.5)
WBC: 7 10*3/uL (ref 4.0–10.5)
nRBC: 0 % (ref 0.0–0.2)

## 2019-10-23 LAB — PROTIME-INR
INR: 1.4 — ABNORMAL HIGH (ref 0.8–1.2)
Prothrombin Time: 16.8 seconds — ABNORMAL HIGH (ref 11.4–15.2)

## 2019-10-23 LAB — TYPE AND SCREEN
ABO/RH(D): B NEG
Antibody Screen: NEGATIVE

## 2019-10-23 LAB — COMPREHENSIVE METABOLIC PANEL
ALT: 36 U/L (ref 0–44)
AST: 74 U/L — ABNORMAL HIGH (ref 15–41)
Albumin: 3.1 g/dL — ABNORMAL LOW (ref 3.5–5.0)
Alkaline Phosphatase: 75 U/L (ref 38–126)
Anion gap: 10 (ref 5–15)
BUN: 20 mg/dL (ref 8–23)
CO2: 27 mmol/L (ref 22–32)
Calcium: 9.1 mg/dL (ref 8.9–10.3)
Chloride: 103 mmol/L (ref 98–111)
Creatinine, Ser: 1.07 mg/dL (ref 0.61–1.24)
GFR calc Af Amer: >60 mL/min (ref >60)
GFR calc non Af Amer: >60 mL/min (ref >60)
Glucose, Bld: 100 mg/dL — ABNORMAL HIGH (ref 70–99)
Potassium: 3.9 mmol/L (ref 3.5–5.1)
Sodium: 140 mmol/L (ref 135–145)
Total Bilirubin: 0.6 mg/dL (ref 0.3–1.2)
Total Protein: 6.5 g/dL (ref 6.5–8.1)

## 2019-10-23 LAB — SARS CORONAVIRUS 2 BY RT PCR (HOSPITAL ORDER, PERFORMED IN ~~LOC~~ HOSPITAL LAB): SARS Coronavirus 2: NEGATIVE

## 2019-10-23 MED ORDER — IOHEXOL 350 MG/ML SOLN
100.0000 mL | Freq: Once | INTRAVENOUS | Status: AC | PRN
  Administered 2019-10-23: 100 mL via INTRAVENOUS

## 2019-10-23 MED ORDER — GLYCERIN (ADULT) 2 G RE SUPP: 1.0000 | RECTAL | 0 refills | Status: DC | PRN

## 2019-10-23 MED ORDER — DOCUSATE SODIUM 100 MG PO CAPS: 100.0000 mg | ORAL_CAPSULE | Freq: Two times a day (BID) | ORAL | 0 refills | Status: DC

## 2019-10-23 MED ORDER — POLYETHYLENE GLYCOL 3350 17 G PO PACK: 17.0000 g | PACK | Freq: Every day | ORAL | 0 refills | Status: DC

## 2019-10-23 MED ORDER — ACETAMINOPHEN 500 MG PO TABS
1000.0000 mg | ORAL_TABLET | Freq: Once | ORAL | Status: AC
  Administered 2019-10-23: 1000 mg via ORAL
  Filled 2019-10-23: qty 2

## 2019-10-23 NOTE — ED Notes (Signed)
Sister  please give a call  her name is RNH  [phone]

## 2019-10-23 NOTE — ED Triage Notes (Signed)
Pt had unwitnessed fall last night around 2330 at [facility]. Nurses found him on the floor shortly after and put him back in bed, saying he looked fine. Pt on Xarelto d/t afib. This morning patient unable to bear weight on R leg. Hx of dementia, pt at baseline per staff.

## 2019-10-23 NOTE — ED Provider Notes (Signed)
[facility] EMERGENCY DEPARTMENT Provider Note   CSN: [id number] Arrival date & time: 10/23/19  0745     History No chief complaint on file.   Jordan Long. is a 69 y.o. male.  HPI Patient had an unwitnessed fall last night around 2330.  He is from [facility].  Yesterday evening no apparent injury identified.  Patient was put back to bed.  This morning patient was unable to bear any weight on the right leg.  Patient has history of dementia.  Per report from nursing staff, patient is at baseline mentation.  Patient is resting with his eyes slightly closed as I evaluate him.  He is not in distress.  He denies any pain.  He does not talk much but answers simple questions in a soft voice.    Past Medical History:  Diagnosis Date  . DVT (deep venous thrombosis) ([place]) 2010 and 10/2010   after Hernia repair in 2010 s/p IVC filter and coumadin. Next episode after Hernia repair  ( 10/23/2010) . Admitted on 11/02/2010 for repeat DVT. Warfarin was stopped for  5 days before surgery.   Marland Kitchen DVT (deep venous thrombosis) ([place]) 10/2011   right leg after knee surgery  . GERD (gastroesophageal reflux disease)   . Incisional hernia 08/20/2010  . Pulmonary embolism ([place]) 2010    Patient Active Problem List   Diagnosis Date Noted  . Physical deconditioning 04/21/2019  . Basal cell carcinoma (BCC) 12/27/2017  . Hypercoagulable state ([place]) 01/20/2016  . Chronic venous insufficiency 06/04/2015  . Alzheimer's dementia ([place]) 12/25/2013  . Dyslipidemia 06/13/2013  . Preventative health care 12/09/2012  . Osteoarthritis of knees, bilateral 07/13/2011  . GERD (gastroesophageal reflux disease) 07/13/2011  . Generalized anxiety disorder 03/02/2011    Past Surgical History:  Procedure Laterality Date  . APPENDECTOMY  age 80  . FEMORAL ARTERY STENT Right 2010  . HERNIA REPAIR  2010  . HERNIA REPAIR  10/23/2010   Incisional hernia w/mesh  . HERNIA REPAIR    . HIP  ARTHROPLASTY Right 07/30/2019   Procedure: ARTHROPLASTY BIPOLAR HIP (HEMIARTHROPLASTY);  Surgeon: Rod Can, MD;  Location: [facility];  Service: Orthopedics;  Laterality: Right;  . INCISIONAL HERNIA REPAIR N/A 09/22/2012   Procedure: INICISIONAL HERNIA AND BILATERAL RECURRENT INGUINAL HERNIA  REPAIRED [place] ;  Surgeon: Adin Hector, MD;  Location: [facility];  Service: General;  Laterality: N/A;  . INGUINAL HERNIA REPAIR Bilateral 09/22/2012   Procedure: LAPAROSCOPIC BILATERAL INGUINAL HERNIA REPAIR;  Surgeon: Adin Hector, MD;  Location: [facility];  Service: General;  Laterality: Bilateral;  RECURRENT INGUINAL HERNIAS BILATERAL   . INSERTION OF MESH Bilateral 09/22/2012   Procedure: INSERTION OF MESH;  Surgeon: Adin Hector, MD;  Location: [facility];  Service: General;  Laterality: Bilateral;  FOR REPAIR OF BILATERAL INGUINAL HERNIAS AND ALSO MESH INSERTED FOR VENTRAL HERNIA REPAIR   . KNEE SURGERY Right 10/2011   [facility]  . LAPAROSCOPIC LYSIS OF ADHESIONS N/A 09/22/2012   Procedure: LAPAROSCOPIC LYSIS OF ADHESIONS 90 minutes ;  Surgeon: Adin Hector, MD;  Location: [facility];  Service: General;  Laterality: N/A;  . septic abdominal surgery  2010  . TONSILLECTOMY  as child       Family History  Problem Relation Age of Onset  . Cancer Father        Lymphoma   . Raynaud syndrome Father   . Diabetes Mother   . Parkinsonism Mother   . Heart disease Mother   .  Schizophrenia Mother   . Thyroid disease Sister   . Diabetes Sister   . Raynaud syndrome Sister   . Lupus Sister   . Raynaud syndrome Sister   . Heart attack Other        neice  . Colon cancer Neg Hx     Social History   Tobacco Use  . Smoking status: Former Smoker    Years: 2.00    Types: Cigarettes    Quit date: 09/23/1985    Years since quitting: 34.1  . Smokeless tobacco: Never Used  Substance Use Topics  . Alcohol use: No    Alcohol/week: 0.0 standard drinks  . Drug use: No    Home Medications Prior to  Admission medications   Medication Sig Start Date End Date Taking? Authorizing Provider  acetaminophen (TYLENOL) 325 MG tablet Take 2 tablets (650 mg total) by mouth every 6 (six) hours as needed. 07/31/19   Swinteck, Aaron Edelman, MD  donepezil (ARICEPT) 10 MG tablet Take 1 tablet (10 mg total) by mouth at bedtime. 12/10/17   Axel Filler, MD  DULoxetine (CYMBALTA) 60 MG capsule Take 1 capsule (60 mg total) by mouth daily. 12/10/17   Axel Filler, MD  Emollient [facility]) LOTN Apply two times per day as needed for itching and dry skin. Patient taking differently: Apply topically 2 (two) times daily.  08/18/18   Seawell, Jaimie A, DO  feeding supplement, ENSURE ENLIVE, (ENSURE ENLIVE) LIQD Take 237 mLs by mouth 2 (two) times daily between meals. 03/21/19   Axel Filler, MD  HYDROcodone-acetaminophen (NORCO) 5-325 MG tablet Take 1 tablet by mouth every 12 (twelve) hours. 10/04/19   Axel Filler, MD  morphine 10 MG/5ML solution Take 2.5 mLs (5 mg total) by mouth every 6 (six) hours as needed for severe pain. 06/22/19   Axel Filler, MD  Multiple Vitamins-Minerals (MULTIVITAMIN ADULT) CHEW Chew 1 tablet by mouth daily. 04/21/19   Marcelyn Bruins, MD  omeprazole (PRILOSEC) 20 MG capsule Take 1 capsule (20 mg total) by mouth daily. 12/10/17   Axel Filler, MD  polyethylene glycol (MIRALAX / GLYCOLAX) 17 g packet Take 17 g by mouth daily as needed for moderate constipation or severe constipation. 01/26/19   Annita Brod, MD  risperiDONE (RISPERDAL) 0.25 MG tablet Take 0.25 mg by mouth daily.  06/17/19   [provider]  XARELTO 20 MG TABS tablet Take 1 tablet (20 mg total) by mouth daily. 08/03/19   Maudie Mercury, MD    Allergies    Patient has no known allergies.  Review of Systems   Review of Systems Level 5 caveat cannot obtain review of systems due to dementia. Physical Exam Updated Vital Signs BP 121/82   Pulse 60   Temp (!) 97 F  (36.1 C) (Temporal)   Resp 12   SpO2 100%   Physical Exam Constitutional:      Comments: Alert and nontoxic.  No respiratory distress.  Cervical collar in place.  Resting quietly keeping eyes closed.  HENT:     Head: Normocephalic and atraumatic.     Mouth/Throat:     Pharynx: Oropharynx is clear.  Eyes:     Extraocular Movements: Extraocular movements intact.     Pupils: Pupils are equal, round, and reactive to light.  Neck:     Comments: Cervical collar in place. Cardiovascular:     Comments: Rate control.  Irregularly irregular.  No significant murmur. Pulmonary:     Effort: Pulmonary effort  is normal.     Breath sounds: Normal breath sounds.  Chest:     Chest wall: No tenderness.  Abdominal:     Comments: Abdomen is nondistended.  Surgical scars lower abdomen.  Patient seems to exhibit some mild tenderness to palpation of lower abdomen.  He does not endorse pain but seems to grimace slightly with palpation.  Musculoskeletal:     Comments: Patient has surgical scar over the right hip.  Hip on the right seems to have round protrusion of the trochanter.  No soft tissue injury.  Right leg is not rotated.  No abrasions or contusions.  DP pulses easily palpable on the left foot.  I cannot manually palpate DP pulse on the right.  Hand-held Doppler used for positive pulse present on right. No deformities of upper extremities.  I put upper extremity through passive range of motion no expression of pain or limitation.  Skin:    General: Skin is warm and dry.  Neurological:     Comments: Patient is quietly resting.  He makes soft voice responses to questions.  Mostly patient answers yes or no.  No focal motor deficit.  Patient has fine tremor with range of motion both upper extremities.  Psychiatric:        Mood and Affect: Mood normal.     Comments: Patient is quiet and not exhibiting distress.     ED Results / Procedures / Treatments   Labs (all labs ordered are listed, but only  abnormal results are displayed) Labs Reviewed  COMPREHENSIVE METABOLIC PANEL - Abnormal; Notable for the following components:      Result Value   Glucose, Bld 100 (*)    Albumin 3.1 (*)    AST 74 (*)    All other components within normal limits  CBC WITH DIFFERENTIAL/PLATELET - Abnormal; Notable for the following components:   RBC 3.93 (*)    Hemoglobin 10.8 (*)    HCT 35.3 (*)    All other components within normal limits  PROTIME-INR - Abnormal; Notable for the following components:   Prothrombin Time 16.8 (*)    INR 1.4 (*)    All other components within normal limits  SARS CORONAVIRUS 2 BY RT PCR (HOSPITAL ORDER, [facility])  URINALYSIS, ROUTINE W REFLEX MICROSCOPIC  TYPE AND SCREEN    EKG EKG Interpretation  Date/Time:  Monday October 23 2019 07:49:16 EDT Ventricular Rate:  147 PR Interval:    QRS Duration: 61 QT Interval:  168 QTC Calculation: 244 R Axis:   -9 Text Interpretation: Sinus rhythm tremor artifact, no sig change from old Confirmed by Charlesetta Shanks [phone]) on 10/23/2019 2:29:01 PM   Radiology CT ABDOMEN PELVIS WO CONTRAST  Result Date: 10/23/2019 CLINICAL DATA:  69 year old with trauma, fall and abdominal pain. Decreased pulses in the right foot. Evaluate for retroperitoneal bleed. EXAM: CT ABDOMEN AND PELVIS WITHOUT CONTRAST TECHNIQUE: Multidetector CT imaging of the abdomen and pelvis was performed following the standard protocol without IV contrast. COMPARISON:  CT abdomen and pelvis 01/13/2009 and right hip radiographs 10/23/2019 FINDINGS: Lower chest: Dependent densities or atelectasis in the lower lobes. No pleural effusions. Hepatobiliary: Normal appearance of the liver and gallbladder on this noncontrast examination. Pancreas: Unremarkable. No pancreatic ductal dilatation or surrounding inflammatory changes. Spleen: Normal in size without focal abnormality. Adrenals/Urinary Tract: Normal appearance of the adrenal glands. 3  mm calcification in the right kidney lower pole is suggestive for a nonobstructive stone. Low-density structures in left kidney  are suggestive for renal cysts. Negative for hydronephrosis. Urinary bladder is decompressed. Stomach/Bowel: Normal appearance of the stomach and small bowel. Rectum is distended with stool. No evidence for bowel obstruction or focal bowel inflammation. Vascular/Lymphatic: Atherosclerotic calcifications in the abdominal aorta without aneurysm. Again noted is a Sales promotion account executive IVC filter extending from the infrarenal IVC into the right iliac veins. No significant lymph node enlargement in the abdomen or pelvis. Reproductive: Prostate has some calcifications. Other: Dependent subcutaneous edema in the lower abdomen and pelvis. No evidence for retroperitoneal or intra-abdominal hematoma. Negative for ascites. Negative for free air. Suspect postsurgical changes along the anterior abdomen. Musculoskeletal: Right hip arthroplasty is located. Limited evaluation of the right pelvic bony structures due to artifact from the hip replacement. Complete disc space loss at L5-S1 with anterolisthesis at L5-S1. Suspect chronic bilateral pars defects at L5. Vertebral body height loss at T11 appears chronic. IMPRESSION: 1. No evidence for an intra-abdominal or retroperitoneal hematoma. 2. No acute abnormality in the abdomen or pelvis. 3. Rectum is distended with a large amount of stool. 4. Chronic IVC filter. 5. 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 6. Small nonobstructive right kidney stone. Electronically Signed   By: Markus Daft M.D.   On: 10/23/2019 11:56   DG Chest 1 View  Result Date: 10/23/2019 CLINICAL DATA:  Fall EXAM: CHEST  1 VIEW COMPARISON:  07/29/2019 chest radiograph. FINDINGS: Stable cardiomediastinal silhouette with normal heart size. No pneumothorax. No pleural effusion. Lungs appear clear, with no acute consolidative airspace disease and no pulmonary  edema. IMPRESSION: No active disease. Electronically Signed   By: Ilona Sorrel M.D.   On: 10/23/2019 09:07   DG Tibia/Fibula Right  Result Date: 10/23/2019 CLINICAL DATA:  Unwitnessed fall.  Unable to bear weight. EXAM: RIGHT TIBIA AND FIBULA - 2 VIEW COMPARISON:  Multiple priors, including the radiographs from 07/29/2019 FINDINGS: No evidence of acute fracture or dislocation. Partially imaged degenerative changes at the knee, similar to prior including large marginal osteophytes involving the patellofemoral and lateral compartments. IMPRESSION: No evidence of acute fracture. Electronically Signed   By: Margaretha Sheffield MD   On: 10/23/2019 14:02   CT Head Wo Contrast  Result Date: 10/23/2019 CLINICAL DATA:  Pain following fall EXAM: CT HEAD WITHOUT CONTRAST CT CERVICAL SPINE WITHOUT CONTRAST TECHNIQUE: Multidetector CT imaging of the head and cervical spine was performed following the standard protocol without intravenous contrast. Multiplanar CT image reconstructions of the cervical spine were also generated. COMPARISON:  Head CT; cervical spine CT July 29, 2019 FINDINGS: CT HEAD FINDINGS Brain: There is stable mild to moderate diffuse atrophy. There is no appreciable intracranial mass, hemorrhage, extra-axial fluid collection, or midline shift. There is patchy small vessel disease in the centra semiovale bilaterally, stable. No acute appearing infarct is demonstrable on this study. Vascular: There is no hyperdense vessel. No appreciable vascular calcification evident. Skull: Bony calvarium appears intact. Sinuses/Orbits: There is mucosal thickening in several ethmoid air cells. Other paranasal sinuses are clear. Orbits appear symmetric bilaterally. Other: Mastoid air cells are clear. CT CERVICAL SPINE FINDINGS Alignment: There is 2 mm of anterolisthesis of C7 on T1, stable. No new spondylolisthesis. Skull base and vertebrae: Skull base and craniocervical junction regions appear normal. No fracture  evident. No blastic or lytic bone lesions. Soft tissues and spinal canal: Prevertebral soft tissues and predental space regions are normal. No cord or canal hematoma evident. No paraspinous lesions. Disc levels: There is moderately severe disc space narrowing  at C5-6. There is moderate disc space narrowing at C6-7 and C7-T1. There is multilevel facet hypertrophy. There is exit foraminal narrowing with impression on exiting nerve roots at C3-4 bilaterally. There is severe exit foraminal narrowing with marked impression on the exiting nerve root on the right at C4-5. Milder facet hypertrophy on the left at this level without nerve root edema. There is severe exit foraminal narrowing at C5-6 bilaterally due to moderately severe facet hypertrophy bilaterally. Similar changes at C6-7 bilaterally. No frank disc extrusion or high-grade stenosis. Upper chest: Visualized upper lung regions are clear. Other: There is slight calcification in the left subclavian artery. IMPRESSION: CT head: Stable atrophy with periventricular small vessel disease. No acute infarct. No mass, hemorrhage, or extra-axial fluid collection. Areas of mucosal thickening in several ethmoid air cells noted. CT cervical spine: 1. No appreciable fracture. Stable 2 mm of anterolisthesis of C7 on T1, likely due to underlying spondylosis. No new spondylolisthesis. 2. Multilevel arthropathy with exit foraminal narrowing at several levels, most severe on the right at C4-5 and at C5-6 and C6-7 bilaterally. No frank disc extrusion or stenosis. Electronically Signed   By: Lowella Grip III M.D.   On: 10/23/2019 08:44   CT Cervical Spine Wo Contrast  Result Date: 10/23/2019 CLINICAL DATA:  Pain following fall EXAM: CT HEAD WITHOUT CONTRAST CT CERVICAL SPINE WITHOUT CONTRAST TECHNIQUE: Multidetector CT imaging of the head and cervical spine was performed following the standard protocol without intravenous contrast. Multiplanar CT image reconstructions of the  cervical spine were also generated. COMPARISON:  Head CT; cervical spine CT July 29, 2019 FINDINGS: CT HEAD FINDINGS Brain: There is stable mild to moderate diffuse atrophy. There is no appreciable intracranial mass, hemorrhage, extra-axial fluid collection, or midline shift. There is patchy small vessel disease in the centra semiovale bilaterally, stable. No acute appearing infarct is demonstrable on this study. Vascular: There is no hyperdense vessel. No appreciable vascular calcification evident. Skull: Bony calvarium appears intact. Sinuses/Orbits: There is mucosal thickening in several ethmoid air cells. Other paranasal sinuses are clear. Orbits appear symmetric bilaterally. Other: Mastoid air cells are clear. CT CERVICAL SPINE FINDINGS Alignment: There is 2 mm of anterolisthesis of C7 on T1, stable. No new spondylolisthesis. Skull base and vertebrae: Skull base and craniocervical junction regions appear normal. No fracture evident. No blastic or lytic bone lesions. Soft tissues and spinal canal: Prevertebral soft tissues and predental space regions are normal. No cord or canal hematoma evident. No paraspinous lesions. Disc levels: There is moderately severe disc space narrowing at C5-6. There is moderate disc space narrowing at C6-7 and C7-T1. There is multilevel facet hypertrophy. There is exit foraminal narrowing with impression on exiting nerve roots at C3-4 bilaterally. There is severe exit foraminal narrowing with marked impression on the exiting nerve root on the right at C4-5. Milder facet hypertrophy on the left at this level without nerve root edema. There is severe exit foraminal narrowing at C5-6 bilaterally due to moderately severe facet hypertrophy bilaterally. Similar changes at C6-7 bilaterally. No frank disc extrusion or high-grade stenosis. Upper chest: Visualized upper lung regions are clear. Other: There is slight calcification in the left subclavian artery. IMPRESSION: CT head: Stable  atrophy with periventricular small vessel disease. No acute infarct. No mass, hemorrhage, or extra-axial fluid collection. Areas of mucosal thickening in several ethmoid air cells noted. CT cervical spine: 1. No appreciable fracture. Stable 2 mm of anterolisthesis of C7 on T1, likely due to underlying spondylosis. No new spondylolisthesis. 2.  Multilevel arthropathy with exit foraminal narrowing at several levels, most severe on the right at C4-5 and at C5-6 and C6-7 bilaterally. No frank disc extrusion or stenosis. Electronically Signed   By: Lowella Grip III M.D.   On: 10/23/2019 08:44   CT ANGIO AO+BIFEM W & OR WO CONTRAST  Result Date: 10/23/2019 CLINICAL DATA:  69 year old male with a history of arterial embolism lower extremity. EXAM: CT ANGIOGRAPHY OF ABDOMINAL AORTA WITH ILIOFEMORAL RUNOFF TECHNIQUE: Multidetector CT imaging of the abdomen, pelvis and lower extremities was performed using the standard protocol during bolus administration of intravenous contrast. Multiplanar CT image reconstructions and MIPs were obtained to evaluate the vascular anatomy. CONTRAST:  135mL OMNIPAQUE IOHEXOL 350 MG/ML SOLN COMPARISON:  None. FINDINGS: VASCULAR Aorta: Unremarkable course, caliber, contour of the abdominal aorta. No dissection, aneurysm, or periaortic fluid. Mild atherosclerotic changes. Celiac: Patent, with no significant atherosclerotic changes. SMA: Patent, with no significant atherosclerotic changes. Renals: - Right: Right renal artery patent. Minimal atherosclerotic changes at the origin the right renal artery. - Left: Left renal artery patent. Minimal atherosclerotic changes at the origin of the left renal artery. IMA: Inferior mesenteric artery is patent. Right lower extremity: Mild ectasia of the right common iliac artery, less than 15 mm. Tortuous course of the right iliac arterial system. No aneurysm, dissection, or occlusion. Hypogastric artery is patent. Common femoral artery patent. Proximal  SFA and profunda femoris patent. No significant atherosclerosis of the common femoral artery. Profunda femoris and the thigh branches are patent. Minimal atherosclerosis of the right SFA without high-grade stenosis or occlusion. Popliteal artery patent. Anterior tibial artery is patent at the origin, and remains patent to the ankle. Tibioperoneal trunk is patent. Posterior tibial artery is patent from the origin to the ankle. Peroneal artery is patent from the origin to the distal lower leg, without evidence of occlusion. Left lower extremity: Ectasias of the left common iliac artery, less than 15 mm. Tortuous left iliac arterial system. No aneurysm, dissection, or occlusion. Hypogastric artery is patent. Common femoral artery patent. Proximal SFA and profunda femoris patent. High bifurcation of the left common femoral artery. No significant atherosclerotic changes. Profunda femoris is patent, with patent thigh branches. Minimal atherosclerosis of the superficial femoral artery. Popliteal artery is patent. Anterior tibial artery patent at the origin with patency maintained to the ankle. Tibioperoneal trunk patent. Posterior tibial artery patent from the origin to the ankle. Peroneal artery patent from the origin to the distal lower leg. Veins: There is a bird's nest filter within the IVC, which has been mild deployed, with 1 segment in the IVC in 1 segment in the right iliac vein. Otherwise unremarkable appearance of the venous system though is not opacified given the timing of the contrast bolus. Unremarkable portal system. Review of the MIP images confirms the above findings. NON-VASCULAR Lower chest: Atelectatic changes at the base of the left lung. Hepatobiliary: Unremarkable appearance of the liver. Unremarkable gall bladder. Pancreas: Unremarkable. Spleen: Unremarkable. Adrenals/Urinary Tract: - Right adrenal gland: Unremarkable - Left adrenal gland: Unremarkable. - Right kidney: No hydronephrosis,  nephrolithiasis, inflammation, or ureteral dilation. No focal lesion. - Left Kidney: No hydronephrosis, nephrolithiasis, inflammation, or ureteral dilation. Small hypodense lesion on the anterior cortex of the left kidney measures 12 mm-13 mm with fluid density and no evidence of internal enhancement/complexity. Hypodense lesion on the posterior cortex of the left kidney, 3.6 cm, fluid density, without internal complexity or enhancement. Both of these cystic lesions are compatible with benign cyst. - Urinary Bladder:  Urinary bladder relatively decompressed. Stomach/Bowel: - Stomach: Unremarkable. - Small bowel: Unremarkable - Appendix: Appendix is not visualized, however, no inflammatory changes are present adjacent to the cecum to indicate an appendicitis. - Colon: Formed stool through the length of the transverse colon, descending colon, sigmoid colon and rectum. No evidence of abnormally distended small bowel or colon to indicate obstruction. Lymphatic: No adenopathy. Mesenteric: No free fluid or air. No mesenteric adenopathy. Reproductive: Unremarkable prostate. Other: Surgical changes along the midline abdomen with no hernia. Musculoskeletal: Osteopenia. Grade 2 anterolisthesis of L5 on S1 with associated bilateral pars defect. Compression fracture of T11, unchanged from the comparison. Interval resolution of the sclerotic changes of the T12 superior endplate. No acute displaced fracture.  No significant bony canal narrowing. Surgical changes of right hip arthroplasty. Degenerative changes of the left hip. Degenerative changes of the knee joints. IMPRESSION: The CT angiogram runoff is negative for evidence of arterial occlusion of the bilateral lower extremities. Mild PA D, including: - Aortic Atherosclerosis (ICD10-I70.0). -no significant iliac arterial disease -minimal bilateral femoropopliteal arterial disease -minimal bilateral tibial disease. The 2 components of the bird's nest filter are within the IVC  and the right common iliac vein. Moderate to large formed stool burden, including rectum. Correlation with any history of constipation may be useful. CT negative for signs of obstruction. Additional ancillary findings as above. Signed, Dulcy Fanny. Dellia Nims, RPVI [facility] Radiology Electronically Signed   By: Corrie Mckusick D.O.   On: 10/23/2019 12:26   DG Hip Unilat With Pelvis 2-3 Views Right  Result Date: 10/23/2019 CLINICAL DATA:  Fall EXAM: DG HIP (WITH OR WITHOUT PELVIS) 2-3V RIGHT COMPARISON:  06/18/2019 and right hip radiographs. FINDINGS: Right hip hemiarthroplasty with no evidence of hardware fracture or loosening. No right hip dislocation. No osseous fractures. No pelvic diastasis. Metallic struts noted in right pelvis and medial right lower abdomen, unchanged. Degenerative changes in the visualized lower lumbar spine. No suspicious focal osseous lesions. 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 No evidence of left hip dislocation on the frontal view. IMPRESSION: Right hip hemiarthroplasty with no evidence of hardware complication. No osseous fractures. No hip dislocation. Electronically Signed   By: Ilona Sorrel M.D.   On: 10/23/2019 09:06   DG FEMUR, MIN 2 VIEWS RIGHT  Result Date: 10/23/2019 CLINICAL DATA:  Un witnessed fall.  Unable to bear weight. EXAM: RIGHT FEMUR 2 VIEWS COMPARISON:  Multiple priors, including hip radiographs from 10/23/2019 FINDINGS: Right hip hemiarthroplasty without evidence of hardware complication. Partially imaged degenerative changes of the knee including large marginal osteophytes involving the patellofemoral compartment. Excreted contrast in the bladder. Partially imaged bird's nest IVC filter extending into the right iliac veins, better characterized on same day CT. IMPRESSION: No evidence of acute fracture or dislocation. Electronically Signed   By: Margaretha Sheffield MD   On: 10/23/2019 13:58     Procedures Procedures (including critical care time)  Medications Ordered in ED Medications  iohexol (OMNIPAQUE) 350 MG/ML injection 100 mL (100 mLs Intravenous Contrast Given 10/23/19 1109)  acetaminophen (TYLENOL) tablet 1,000 mg (1,000 mg Oral Given 10/23/19 1315)    ED Course  I have reviewed the triage vital signs and the nursing notes.  Pertinent labs & imaging results that were available during my care of the patient were reviewed by me and considered in my medical decision making (see chart for details).    MDM Rules/Calculators/A&P  I contacted the patient's sister and reviewed diagnostic findings with her.  She reports she will notify the patient's PCP to follow-up on all of our evaluation.  She does report that patient has had some problems at baseline with pain in his knee chronically.  This has caused him to have falls.  He was advised to get a knee replacement but due to his Alzheimer's was not a candidate.  Patient is brought from nursing home with report of an unwitnessed fall last night.  This morning he was nonambulatory reportedly due to pain.  Patient has history of a right hip replacement.  X-ray shows hip to be in place without dislocation.  Patient seem to have some abdominal pain on palpation.  Due to Alzheimer's he cannot communicate specific areas of pain.  Due to patient's age, unwitnessed fall and report of being on Xarelto, CT head obtained.  No acute intracranial bleed identified.  On examination, patient did have some abdominal pain to palpation.  This is diffuse and central.  Patient cannot verbally communicate any details regarding discomfort level.  CT abdomen obtained to look for occult fracture in pelvis and further evaluation for any possible retroperitoneal bruising or solid organ injury from fall.  No acute findings identified.  Radiologist reports constipation.  Prescriptions provided for MiraLAX, Colace and glycerin  suppositories.  On physical examination patient had pulse differential right to left.  Right pedal pulses not palpable but is present by handheld Doppler.  Left pulses easily palpable.  CT angiogram does not show any critical blockages.  Patient sister reports he has chronically had some changes of slightly more swollen in color differential on the right leg.  She reports that his doctor is aware of the difference in pulse findings.  They are continue to monitor this on outpatient basis but no interventions have been recommended.  Patient will be discharged for return to nursing home care.  Continue to observe for any changes from baseline or other findings that appear new or acute.    Final Clinical Impression(s) / ED Diagnoses Final diagnoses:  Unwitnessed fall  Ambulatory dysfunction  Painful gait  Alzheimer's dementia without behavioral disturbance, unspecified timing of dementia onset ([place])  Decreased dorsalis pedis pulse    Rx / DC Orders ED Discharge Orders    None       Charlesetta Shanks, MD 10/23/19 1439

## 2019-10-23 NOTE — Discharge Instructions (Addendum)
1.  Extensive diagnostic testing was done for fall with extremity pain and possible head injury.  No fractures or significant injuries were identified.  Patient may have bruising or strain of his hip, knee or back creating pain.  Give extra strength Tylenol every 6 hours for pain if needed.  Apply cool ice packs to areas of pain or swelling.  Patient may need additional help with physical therapy evaluation. 2.  It was identified that patient had a decreased pulse on the right foot.  He did have an arterial CT angiogram which confirms there is flow present and no arterial blockage.  This should be followed up with by the patient's primary provider. 3.  CT scan the patient's abdomen did not show any acute injury findings.  He does have a significant amount of stool present which appears consistent with constipation and a large amount of stool in the rectum.  Start giving MiraLAX daily and Colace twice daily.  Place glycerin suppositories daily until patient is having regular bowel movements.  4. Return to emergency department if new worsening or concerning symptoms.

## 2019-10-23 NOTE — Telephone Encounter (Signed)
Pt sister wanted to let the physician know that pt is in ED [phone]

## 2019-10-23 NOTE — Telephone Encounter (Signed)
Thanks for letting me know!

## 2019-11-13 ENCOUNTER — Ambulatory Visit: Payer: Self-pay

## 2019-11-13 NOTE — Chronic Care Management (AMB) (Signed)
Encounter opened in error.    Ronn Melena, [place] Coordination Social Worker [place] [phone]

## 2019-12-04 ENCOUNTER — Other Ambulatory Visit: Payer: Self-pay | Admitting: *Deleted

## 2019-12-04 MED ORDER — HYDROCODONE-ACETAMINOPHEN 5-325 MG PO TABS
1.0000 | ORAL_TABLET | Freq: Two times a day (BID) | ORAL | 0 refills | Status: DC
Start: 2019-12-04 — End: 2020-01-03

## 2019-12-18 IMAGING — CT CT HEAD W/O CM
4 series · 16 of 47 positions shown, 18 images · non-contrast
Comparison: MRI brain from 9055 and head CT from 3541

CLINICAL DATA: Headache.

EXAM:
CT HEAD WITHOUT CONTRAST
TECHNIQUE: Contiguous axial images were obtained from the base of the skull
through the vertex without intravenous contrast.

[Series 4: head without · axial · non-contrast · 0.44mm/px · z∈[-137,-17]mm · 7 of 34 slices shown, 9 images]
[im 5/34  brain]
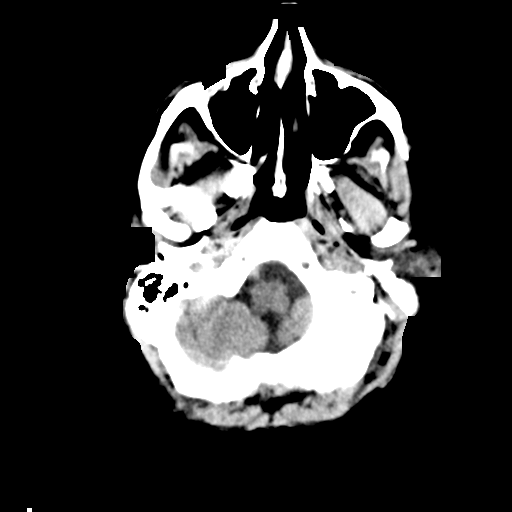
[im 5/34  bone]
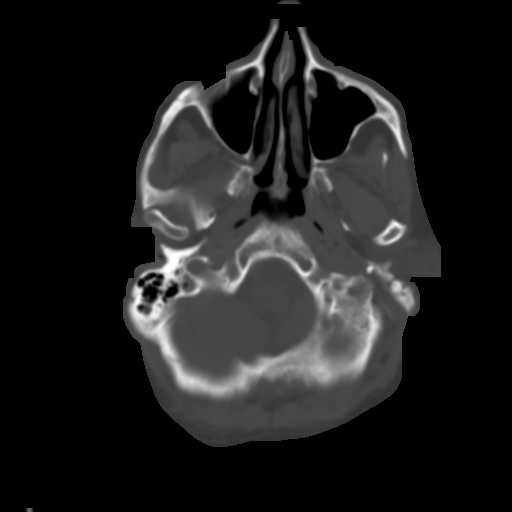
[im 9/34  brain]
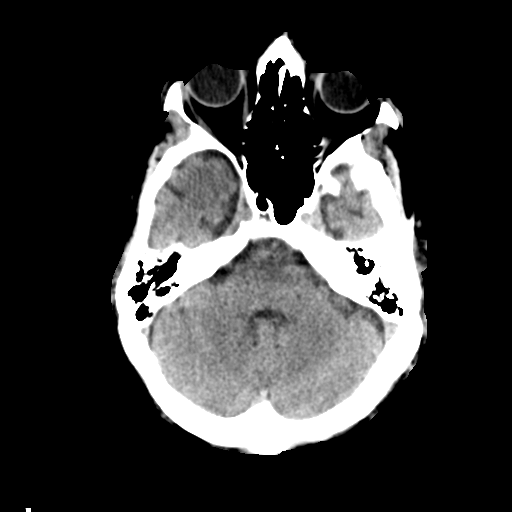
[im 13/34  brain]
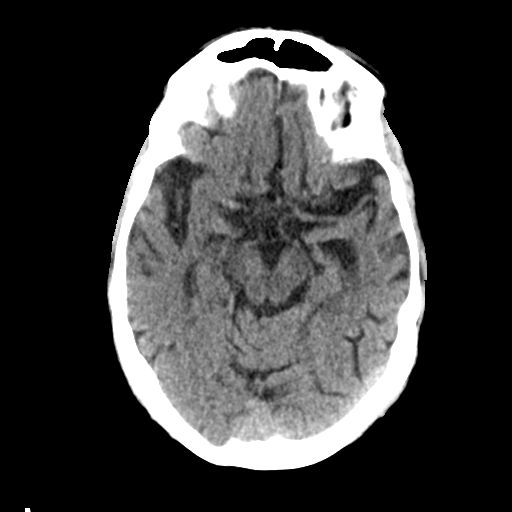
[im 17/34  brain]
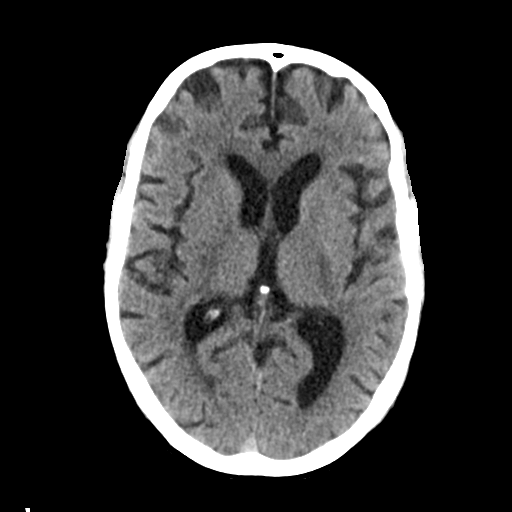
[im 21/34  brain]
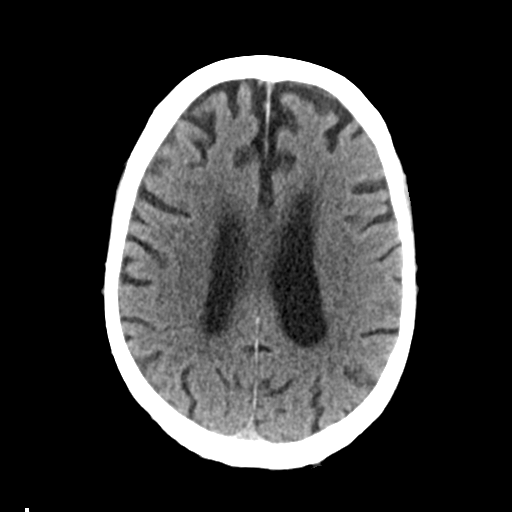
[im 21/34  bone]
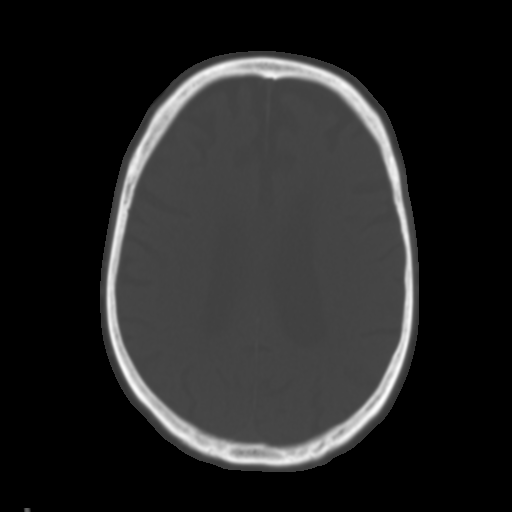
[im 25/34  brain]
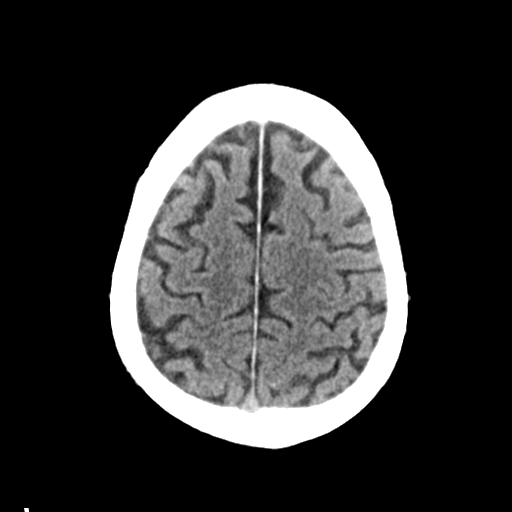
[im 29/34  brain]
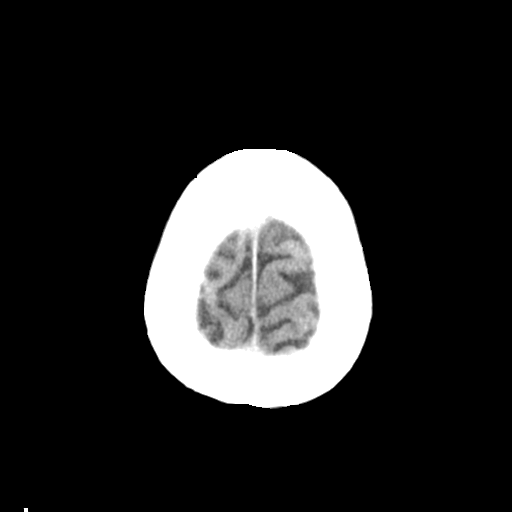

[Series 5: head bone · axial · 0.44mm/px · z∈[-141,-107]mm · 3 of 85 slices shown]
[im 9/85  bone]
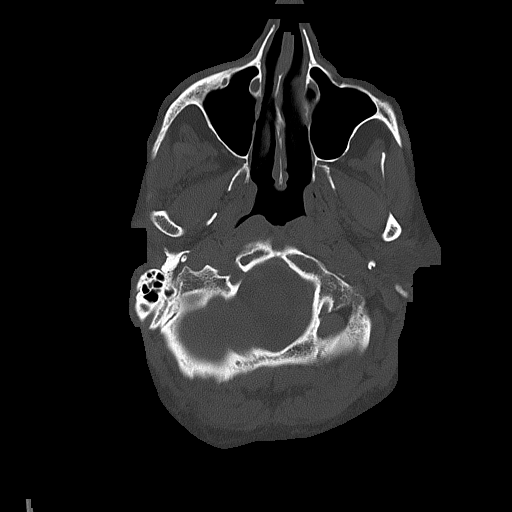
[im 17/85  bone]
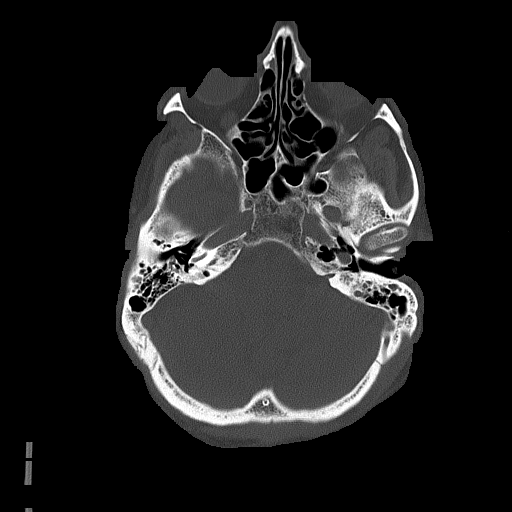
[im 26/85  bone]
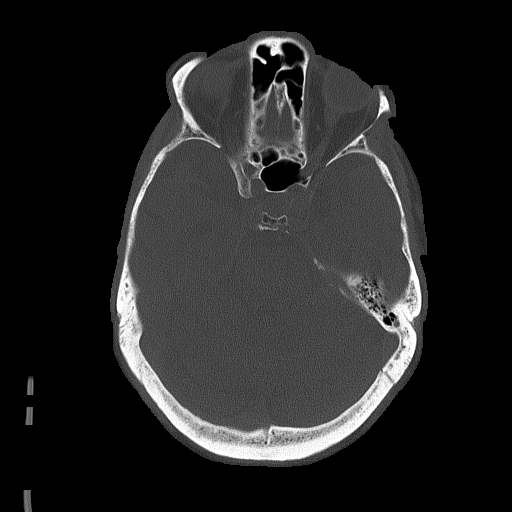

[Series 6: head without cor · coronal · non-contrast · 0.31mm/px · 3 of 68 slices shown]
[im 23/68  brain]
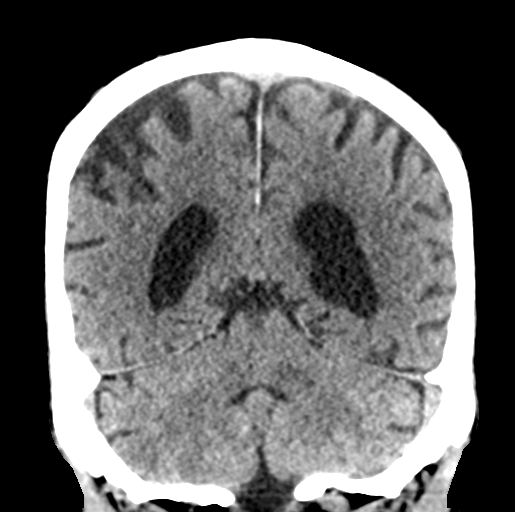
[im 30/68  brain]
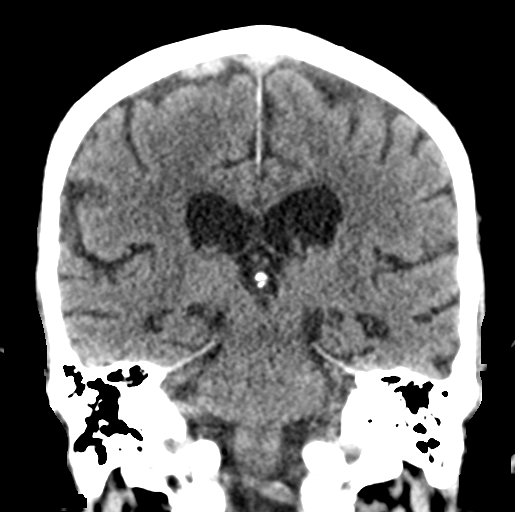
[im 38/68  brain]
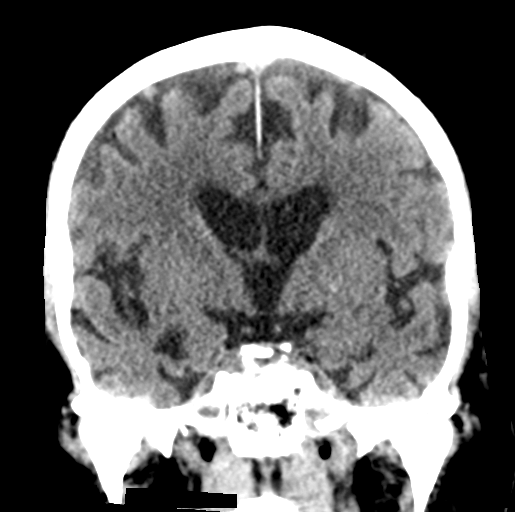

[Series 7: head without sag · sagittal · non-contrast · 0.31mm/px · 3 of 55 slices shown]
[im 19/55  brain]
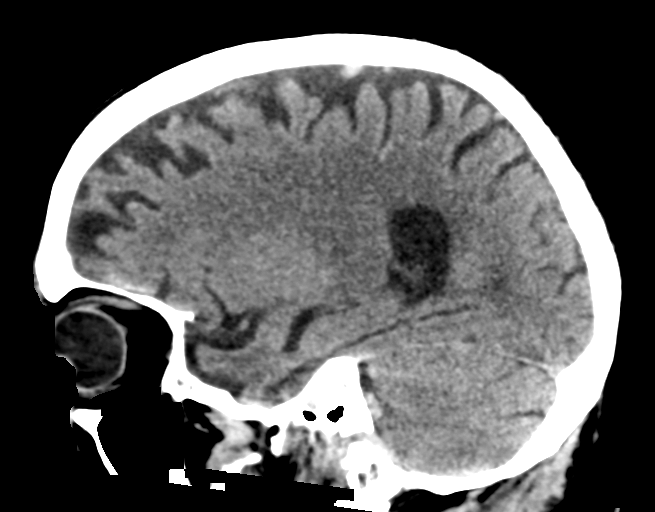
[im 28/55  brain]
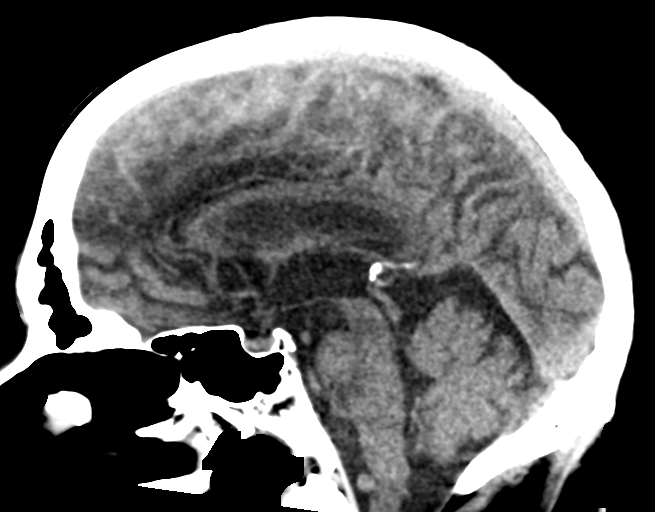
[im 37/55  brain]
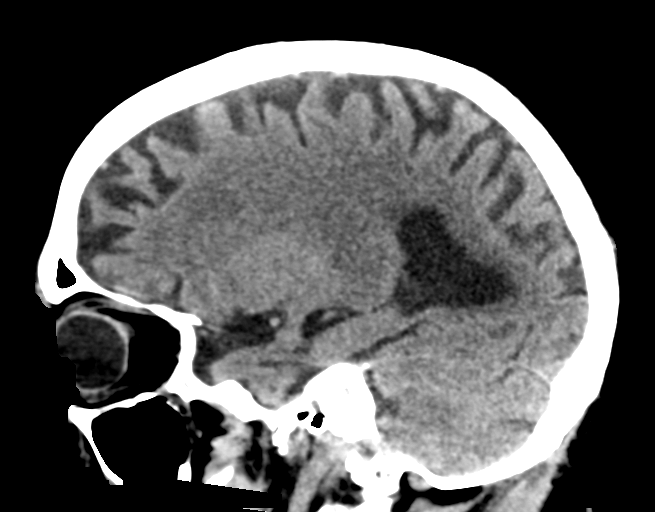

[16 of 47 positions shown; findings below may reference images not displayed]

FINDINGS: Brain: Progressive cerebral atrophy, ventriculomegaly and mild
periventricular white matter disease since the prior CT scan of
3541. No acute intracranial findings such as hemispheric infarction
or intracranial hemorrhage. No mass lesions. No extra-axial fluid
collections are identified. The brainstem and cerebellum are grossly
normal and stable.

Vascular: No significant vascular calcifications and no definite
aneurysm or hyperdense vessels.

Skull: No skull fracture or bone lesion.

Sinuses/Orbits: The paranasal sinuses and mastoid air cells are
clear. The globes are intact.

Other: No scalp lesions are hematoma.
IMPRESSION: 1. Progressive cerebral atrophy, ventriculomegaly and mild
periventricular white matter disease since the prior CT scan of
2. No acute intracranial findings or mass lesions.

## 2020-01-01 ENCOUNTER — Telehealth: Payer: Self-pay | Admitting: *Deleted

## 2020-01-01 NOTE — Telephone Encounter (Signed)
Pt's sister calls and states she would like dr Evette Doffing to come by the facility to see pt in the near future

## 2020-01-02 NOTE — Telephone Encounter (Signed)
I think I can do that. Can you put him on the schedule for me for Friday morning at 9:30. I will call [name] before hand to get the updates about the last few months.

## 2020-01-02 NOTE — Telephone Encounter (Signed)
It is done, thank you!

## 2020-01-03 ENCOUNTER — Other Ambulatory Visit: Payer: Self-pay | Admitting: *Deleted

## 2020-01-03 MED ORDER — HYDROCODONE-ACETAMINOPHEN 5-325 MG PO TABS
1.0000 | ORAL_TABLET | Freq: Two times a day (BID) | ORAL | 0 refills | Status: DC
Start: 2020-01-03 — End: 2020-08-01

## 2020-01-05 ENCOUNTER — Other Ambulatory Visit: Payer: Self-pay

## 2020-01-05 ENCOUNTER — Ambulatory Visit (INDEPENDENT_AMBULATORY_CARE_PROVIDER_SITE_OTHER): Payer: Medicare HMO | Admitting: Student in an Organized Health Care Education/Training Program

## 2020-01-05 ENCOUNTER — Encounter: Payer: Self-pay | Admitting: Student in an Organized Health Care Education/Training Program

## 2020-01-05 DIAGNOSIS — I872 Venous insufficiency (chronic) (peripheral): Secondary | ICD-10-CM

## 2020-01-05 DIAGNOSIS — Z0000 Encounter for general adult medical examination without abnormal findings: Secondary | ICD-10-CM

## 2020-01-05 DIAGNOSIS — F0280 Dementia in other diseases classified elsewhere without behavioral disturbance: Secondary | ICD-10-CM

## 2020-01-05 DIAGNOSIS — F411 Generalized anxiety disorder: Secondary | ICD-10-CM

## 2020-01-05 DIAGNOSIS — D6859 Other primary thrombophilia: Secondary | ICD-10-CM

## 2020-01-05 DIAGNOSIS — G309 Alzheimer's disease, unspecified: Secondary | ICD-10-CM

## 2020-01-05 NOTE — Progress Notes (Signed)
° °  Assessment and Plan:  See Encounters tab for problem-based medical decision making.   __________________________________________________________  HPI:   This is a home visit for a 69 year old person living with alzheimer's dementia. I was asked to visit Jordan Long by his sister to follow up on his dementia. I last saw him in July after a fall, he had initiated home hospice care around May. Since that time, he had only one trip to the ED for a fall, no hospitalizations or injuries. His medications have been stable. Jordan Long has no complaints today, he does not remember me and struggles to engage in our conversation. Mostly looks out the window. He is residing at [facility], staff tell me he is doing well with no issues of safety concerns or aggressive behavior recently.  I spoke with his sister Jordan Long by phone, she tells me Jordan Long has had a steady decline, eating most days but some days he doesn't eat. Able to walk on his own, but has been unsteady, having several falls, patient doesn't like to use the walker. Not very interactive, stares into space for long periods of time. Jordan Long is very grateful for the work home hospice is doing for him.   __________________________________________________________  Problem List: Patient Active Problem List   Diagnosis Date Noted   Hypercoagulable state ([place]) 01/20/2016    Priority: High   Alzheimer's dementia ([place]) 12/25/2013    Priority: High   Physical deconditioning 04/21/2019    Priority: Medium   Basal cell carcinoma (BCC) 12/27/2017    Priority: Medium   Osteoarthritis of knees, bilateral 07/13/2011    Priority: Medium   Generalized anxiety disorder 03/02/2011    Priority: Medium   Chronic venous insufficiency 06/04/2015    Priority: Low   Dyslipidemia 06/13/2013    Priority: Low   Preventative health care 12/09/2012    Priority: Low   GERD (gastroesophageal reflux disease) 07/13/2011    Priority: Low    Medications:  Reconciled today in Epic __________________________________________________________  Physical Exam:  Gen: Frail appearing man, sitting in a wheelchair Neck: No cervical LAD, No thyromegaly or nodules CV: RRR, no murmurs Pulm: Normal effort, CTA throughout, no wheezing Abd: Soft, NT, ND Ext: Warm, 1+ pitting edema in both legs but left is worse than right Skin: Mild chronic stasis changes of both lower legs and feet with xerosis Neuro: Patient is awake spontaneously and can follow simple commands. He is not oriented to the situation. He has a mild left hand tremor, some stiffness on the upper extremities with slight cog-wheel rigidity on the left arm.

## 2020-01-05 NOTE — Assessment & Plan Note (Signed)
Stable with no further behavior disturbances, no lashing out, much improved safety. Planning to continue with cymbalta and low dose risperidone 0.25mg  daily.

## 2020-01-05 NOTE — Assessment & Plan Note (Signed)
Fully vaccinated to covid. Jordan Long is going to check with the facility to see if he has had a 2021 covid booster or flu shot yet. They should be able to provide those. No other cancer screening is necessary given his limited life expectancy.

## 2020-01-05 NOTE — Assessment & Plan Note (Addendum)
Steady decline in functional status. First diagnosed in 2016, memory symptoms started around 2013. Moved to a memory unit around 2019. Having more motor symptoms as of 2020 which has led to falls at the memory unit, resulting in an arm fracture and then a hip fracture. His current motor symptoms of rigidity and tremor I think represent advanced Alzheimers plus/minus vascular dementia component. I doubt this is Parkinsons as his memory symptoms far pre-dated any motor symptoms. We started home hospice care in May 2021 which has been very helpful to patient and the family. He has lived longer than I anticipated this summer, but thankfully hospice is not seeking to graduate him at this time, prognosis still remains unpredictable, likely months. I think it is ok to continue memantine, probably low benefit at this point, will stop if he has any future side effects.

## 2020-01-05 NOTE — Assessment & Plan Note (Signed)
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 No VTE events in many years. He is tolerating anticoagulation well. Renal function was near normal in September. Planning to continue with rivaroxaban 20mg  daily.

## 2020-01-05 NOTE — Assessment & Plan Note (Signed)
Mild lower extremity edema today with some small skin changes of stasis dermatitis. Etiology is due to physical deconditioning, he is doing very little walking at this point. Would recommend supportive care with compression stockings to the knee. I recommended against the use of diuretics as he does not have heart failure, no signs of volume overload elsewhere, and there is risk to causing him hypotension, dehydration, or falls from more frequent bathroom trips.

## 2020-01-11 ENCOUNTER — Telehealth: Payer: Self-pay | Admitting: *Deleted

## 2020-01-11 DIAGNOSIS — I872 Venous insufficiency (chronic) (peripheral): Secondary | ICD-10-CM

## 2020-01-11 NOTE — Telephone Encounter (Signed)
Sister calls and ask for orders for compression stockings due to edema in legs, states he is sitting for long amounts of time. She is ask to have the hospice nurse get the orders and give to [facility] staff, stockings and ambulate with assist if needed every hour for 5 to 10 mins.

## 2020-01-11 NOTE — Telephone Encounter (Signed)
I have placed an order. Can we fax it over to [facility]. The compression stockings themselves can be obtained over the counter.

## 2020-03-15 ENCOUNTER — Telehealth: Payer: Self-pay

## 2020-03-15 NOTE — Telephone Encounter (Signed)
Pls contact pt sister [phone] regarding medicine

## 2020-03-15 NOTE — Telephone Encounter (Signed)
Sounds reasonable. Thank you. 

## 2020-03-15 NOTE — Telephone Encounter (Signed)
Returned call to Jordan Long. She wanted to make PCP aware that Hospice has stopped 3 meds:  "Fluid pill" - (Not on current med list) Patient's BP has been running low.  Aricept - Memory loss is too advanced for this to help.  Xarelto - Patient has repeated falls and they do not want him to bleed  Hospice will be sending fax with these changes for PCP to sign. Hubbard Hartshorn, BSN, RN-BC

## 2020-05-14 ENCOUNTER — Telehealth: Payer: Self-pay

## 2020-05-14 NOTE — Telephone Encounter (Signed)
RTC, [name] was calling to let Dr. Evette Doffing know that Jordan Long is not eating any longer, his right hand is contracted and "his eyes look strange". RN asked [name] if hospice was seeing him daily, she states no, hospice is seeing patient 2-3X/week.  Informed [name] that RN will send message to PCP, but he is out of the office until Thursday.  Also encouraged her to call Hospice for any questions as they are an excellent resource as well.  She verbalized appreciation for hospice and PCP. SChaplin, RN,BSN

## 2020-05-14 NOTE — Telephone Encounter (Signed)
Pls contact pt sister Sure [phone]

## 2020-05-14 NOTE — Telephone Encounter (Signed)
I agree with everything you said. It sounds like Jordan Long is getting closer to passing away. I am glad he is already well established with hospice. They should have some morphine orders if pain becomes an issue. I will touch base with [name] on Thursday.

## 2020-05-16 NOTE — Telephone Encounter (Signed)
I spoke with [name]. Did some therapeutic listening. Sounds like he is transitioning, but is comfortable and well established with hospice.

## 2020-05-21 ENCOUNTER — Encounter: Payer: Self-pay | Admitting: *Deleted

## 2020-05-21 NOTE — Progress Notes (Unsigned)

## 2020-05-23 NOTE — Progress Notes (Signed)
Would not recommend any further AWVs. Patient has advanced dementia, currently on hospice care.

## 2020-08-01 ENCOUNTER — Other Ambulatory Visit: Payer: Self-pay

## 2020-08-01 MED ORDER — HYDROCODONE-ACETAMINOPHEN 5-325 MG PO TABS
1.0000 | ORAL_TABLET | Freq: Two times a day (BID) | ORAL | 0 refills | Status: DC
Start: 2020-08-01 — End: 2020-09-16

## 2020-08-01 NOTE — Telephone Encounter (Signed)
Received a fax from [place] at [facility] which states " Resident needs a new order for Hydrocodone - Acet 5/325 tablet"  Will forward to PCP. Thank you, SChaplin, RN,BSN

## 2020-09-16 ENCOUNTER — Other Ambulatory Visit: Payer: Self-pay | Admitting: *Deleted

## 2020-09-16 MED ORDER — HYDROCODONE-ACETAMINOPHEN 5-325 MG PO TABS
1.0000 | ORAL_TABLET | Freq: Two times a day (BID) | ORAL | 0 refills | Status: DC
Start: 2020-09-16 — End: 2020-11-08

## 2020-09-16 NOTE — Telephone Encounter (Addendum)
Call from [place] at [facility]. Stated pt needs a refill on Hydrocodone and requesting a copy of rx fax to them @ [phone] so they can put it in pt's chart.

## 2020-09-17 NOTE — Telephone Encounter (Addendum)
Rx was sent electronically.  A copy of rx printed and  faxed to [phone].

## 2020-10-09 ENCOUNTER — Telehealth: Payer: Self-pay | Admitting: *Deleted

## 2020-10-09 NOTE — Telephone Encounter (Signed)
Received a fax from [place] at [facility]. Fax was blurry but stated "resident was found on his bedroom floor". I called their office; talked to [facility], med tech - stated this happened on another shift and she unable to read the entire fax herself. I asked how the pt was doing ; she stated he's doing ok. No injuries were reported to her. I told her I will inform Dr Evette Doffing. I will place fax in Dr [employer].

## 2020-10-10 NOTE — Telephone Encounter (Signed)
Thanks for letting me know. Unfortunately he will continue to be at risk for falls due to his dementia. Would continue his current medicines, including DOAC, benefits outweigh risk I think.

## 2020-11-08 ENCOUNTER — Other Ambulatory Visit: Payer: Self-pay

## 2020-11-08 DIAGNOSIS — D6859 Other primary thrombophilia: Secondary | ICD-10-CM

## 2020-11-08 DIAGNOSIS — Z0000 Encounter for general adult medical examination without abnormal findings: Secondary | ICD-10-CM

## 2020-11-08 MED ORDER — DULOXETINE HCL 60 MG PO CPEP
60.0000 mg | ORAL_CAPSULE | Freq: Every day | ORAL | 3 refills | Status: DC
Start: 2020-11-08 — End: 2021-12-09

## 2020-11-08 MED ORDER — RISPERIDONE 0.25 MG PO TABS
0.2500 mg | ORAL_TABLET | Freq: Every day | ORAL | 1 refills | Status: DC
Start: 2020-11-08 — End: 2021-12-09

## 2020-11-11 MED ORDER — ACETAMINOPHEN 325 MG PO TABS
650.0000 mg | ORAL_TABLET | Freq: Four times a day (QID) | ORAL | 1 refills | Status: DC | PRN
Start: 2020-11-11 — End: 2020-11-11

## 2020-11-11 MED ORDER — XARELTO 20 MG PO TABS: 20.0000 mg | ORAL_TABLET | Freq: Every day | ORAL | Status: DC

## 2020-11-11 MED ORDER — ENSURE ENLIVE PO LIQD
237.0000 mL | Freq: Two times a day (BID) | ORAL | 12 refills | Status: DC
Start: 2020-11-11 — End: 2020-11-11

## 2020-11-11 MED ORDER — MULTIVITAMIN ADULT PO CHEW: 1.0000 | CHEWABLE_TABLET | Freq: Every day | ORAL | 11 refills | Status: DC

## 2020-11-11 MED ORDER — GLYCERIN (ADULT) 2 G RE SUPP: 1.0000 | RECTAL | 0 refills | Status: DC | PRN

## 2020-11-11 MED ORDER — DOCUSATE SODIUM 100 MG PO CAPS
100.0000 mg | ORAL_CAPSULE | Freq: Two times a day (BID) | ORAL | 0 refills | Status: DC
Start: 2020-11-11 — End: 2020-11-11

## 2020-11-11 MED ORDER — HYDROCODONE-ACETAMINOPHEN 5-325 MG PO TABS
1.0000 | ORAL_TABLET | Freq: Two times a day (BID) | ORAL | 0 refills | Status: DC
Start: 2020-11-11 — End: 2022-10-09

## 2020-11-11 MED ORDER — POLYETHYLENE GLYCOL 3350 17 G PO PACK
17.0000 g | PACK | Freq: Every day | ORAL | 0 refills | Status: DC
Start: 2020-11-11 — End: 2020-11-11

## 2020-11-11 NOTE — Addendum Note (Signed)
Addended by: Velora Heckler on: 11/11/2020 04:31 PM   Modules accepted: Orders

## 2020-11-12 MED ORDER — ENSURE ENLIVE PO LIQD: 237.0000 mL | Freq: Two times a day (BID) | ORAL | 12 refills | Status: DC

## 2020-11-12 MED ORDER — ACETAMINOPHEN 325 MG PO TABS: 650.0000 mg | ORAL_TABLET | Freq: Four times a day (QID) | ORAL | 1 refills | Status: DC | PRN

## 2020-11-12 MED ORDER — GLYCERIN (ADULT) 2 G RE SUPP: 1.0000 | RECTAL | 0 refills | Status: DC | PRN

## 2020-11-12 MED ORDER — XARELTO 20 MG PO TABS: 20.0000 mg | ORAL_TABLET | Freq: Every day | ORAL | 3 refills | Status: DC

## 2020-11-12 MED ORDER — POLYETHYLENE GLYCOL 3350 17 G PO PACK
17.0000 g | PACK | Freq: Every day | ORAL | 0 refills | Status: DC
Start: 2020-11-12 — End: 2020-11-14

## 2020-11-12 MED ORDER — DOCUSATE SODIUM 100 MG PO CAPS
100.0000 mg | ORAL_CAPSULE | Freq: Two times a day (BID) | ORAL | 0 refills | Status: DC
Start: 2020-11-12 — End: 2021-12-09

## 2020-11-14 ENCOUNTER — Other Ambulatory Visit: Payer: Self-pay | Admitting: Student in an Organized Health Care Education/Training Program

## 2020-11-20 ENCOUNTER — Other Ambulatory Visit: Payer: Self-pay | Admitting: Student in an Organized Health Care Education/Training Program

## 2020-11-26 ENCOUNTER — Other Ambulatory Visit: Payer: Self-pay | Admitting: Student in an Organized Health Care Education/Training Program

## 2020-12-24 ENCOUNTER — Telehealth: Payer: Self-pay | Admitting: *Deleted

## 2020-12-24 NOTE — Telephone Encounter (Signed)
Received fax from [facility] for clarification. Pt is taking Risperidone 0.5 mg twice daily and 0.25 mg daily at 8 AM. Is this correct? Thanks

## 2020-12-25 NOTE — Telephone Encounter (Signed)
No. It should be just Risperidone 0.5mg  twice daily. Can discontinue the 0.25 mg dose which is old.

## 2020-12-30 NOTE — Telephone Encounter (Signed)
Dr Autumn Patty response faxed to [facility] (fax # [phone]) with confirmation.

## 2021-03-19 ENCOUNTER — Ambulatory Visit: Payer: Medicare HMO | Admitting: Student in an Organized Health Care Education/Training Program

## 2021-03-19 ENCOUNTER — Encounter: Payer: Self-pay | Admitting: Student in an Organized Health Care Education/Training Program

## 2021-03-19 ENCOUNTER — Other Ambulatory Visit: Payer: Self-pay

## 2021-03-19 DIAGNOSIS — D6859 Other primary thrombophilia: Secondary | ICD-10-CM

## 2021-03-19 DIAGNOSIS — R5381 Other malaise: Secondary | ICD-10-CM

## 2021-03-19 DIAGNOSIS — G309 Alzheimer's disease, unspecified: Secondary | ICD-10-CM

## 2021-03-19 DIAGNOSIS — I872 Venous insufficiency (chronic) (peripheral): Secondary | ICD-10-CM

## 2021-03-19 NOTE — Assessment & Plan Note (Signed)
Patient with significant physical deconditioning, wheelchair-bound, also showing some signs of contractures in the upper extremities.  This is due to advanced dementia.  Talked with the staff at [facility] about reengaging with physical therapy which they provide to do range of motion exercises on a daily basis to treat the early contractures.

## 2021-03-19 NOTE — Assessment & Plan Note (Signed)
Severe dementia, very advanced at this point.  Patient is essentially wheelchair-bound, nonverbal, needing assistance in all activities of daily living, stable residing at a memory unit.  Seems to be cared for well.  Engaged with hospice services through a [place].  He continues to have some mild issues with agitation and aggression in the evening time, though this is much improved from last year, we are continuing to use low-dose risperidone for this agitation.  No signs of side effects like akathisia or tardive dyskinesia today.  Okay to continue with Aricept for now, no side effects, probably very little benefit at this point.  Also okay to continue with Cymbalta given his history of depression and many years of use of this medication.

## 2021-03-19 NOTE — Progress Notes (Signed)
° °  Assessment and Plan:  See Encounters tab for problem-based medical decision making.   __________________________________________________________  HPI:   This was a home visit for this 71 year old person for follow-up of advanced Alzheimer's dementia.  I visited with the patient for about 20 minutes at the [facility] unit where he has resided for several years.  Patient was seated at a wheelchair in the dining room, patient was unable to provide any history due to his dementia.  He is essentially nonverbal at this point.  I spoke with his sister, [name], over the phone before and after the visit.  She reports that he has had a decline in his functional status over the last 1 year.  He is now wheelchair-bound during the day, and then bedbound at night.  He needs assistance with all activities like dressing and bathing.  [facility] staff tell me that he does feed himself finger foods.  He has had no recent falls, no recent illness, no hospitalizations or visits to the emergency department.  They report he still has some occasional agitation at night when they put him into bed, though it is better than it was a few months ago.  I reviewed his medication administration record.  __________________________________________________________  Problem List: Patient Active Problem List   Diagnosis Date Noted   Hypercoagulable state ([place]) 01/20/2016    Priority: High   Alzheimer's dementia ([place]) 12/25/2013    Priority: High   Physical deconditioning 04/21/2019    Priority: Medium    Basal cell carcinoma (BCC) 12/27/2017    Priority: Medium    Osteoarthritis of knees, bilateral 07/13/2011    Priority: Medium    Generalized anxiety disorder 03/02/2011    Priority: Medium    Chronic venous insufficiency 06/04/2015    Priority: Low   Dyslipidemia 06/13/2013    Priority: Low   Preventative health care 12/09/2012    Priority: Low   GERD (gastroesophageal reflux disease) 07/13/2011     Priority: Low    Medications: Reconciled today in Epic __________________________________________________________  Physical Exam:  Gen: Chronically ill-appearing man, sitting in a wheelchair, no distress Neck: No cervical LAD, No thyromegaly or nodules Ext: Warm, 1+ pitting edema on the right leg, trace pitting edema on the left Skin: Mild xerosis of both lower extremities with some excoriations, no nonhealing ulcerations Neuro: Patient is not alert, oriented, does not respond to questions, is nonverbal.  He does not follow commands and so strength cannot be assessed.  He has resistance with passive range of motion of his upper and lower extremities consistent with early contractures.

## 2021-03-19 NOTE — Assessment & Plan Note (Signed)
Patient with a hypercoagulable state with recurrent venous thromboembolic events, though has been very stable for many years on anticoagulation with rivaroxaban.  He has had no issues with bleeding or bruising.  Because of his deconditioning and dementia he is wheelchair-bound and so will remain high risk for recurrent DVT.  Goals of care are comfort, he is on hospice measures, I think it is appropriate to continue with Xarelto to lower the risk of symptomatic DVT.

## 2021-03-19 NOTE — Assessment & Plan Note (Signed)
Patient with chronic venous insufficiency, today the right leg is a little worse than the left.  No skin breakdown or nonhealing wounds.  Talked with the staff about using compression stockings.  He has had some compression socks provided by hospice in the past, but they are in need of more pairs.  Staff is willing to put the socks on in the morning and take them off in the evening.  Patient unable to elevate his legs due to his deconditioning and dementia.  We talked about the natural course here, I anticipate some amount of swelling will continue given his venous insufficiency, arthritis, and immobility.

## 2021-05-19 ENCOUNTER — Telehealth: Payer: Self-pay | Admitting: *Deleted

## 2021-05-19 NOTE — Telephone Encounter (Signed)
Call from patient's sister about patient having some bleeding from his arm. Laceration about 3-4 inches above the wrist.  Sister said that when she went to visit patient she noticed his bleeding from the site. Unsure when or how this happened to the patient. Site was dressed by staff.  Stated that she noticed a little bleeding after the dressing was applied. Weeping perhaps.   New dressing was applied using Coban.  Patient was visited by Hospice who stopped the patient's Xarelto today.  Sister was unaware that patient continued on the Xarelto thought it had been stopped.  Informed patient's sister that it had not been discontinued due to patient's history of having the DVT.  Patient's sister asked if Dr. Evette Doffing would write an order to stop the Xarelto if possible.  Note/order can be sent [facility] at [phone]. ?

## 2021-05-20 NOTE — Telephone Encounter (Signed)
Verbal order given by Dr. Evette Doffing to stop patient's Xarelto.  Spoke with Chauncy Passy, Med Tech gave her the verbal order to stop the Xarelto. Will need an order faxed order to the Facility as well.   Can fax to [facility] at [phone].   ?

## 2021-05-26 NOTE — Telephone Encounter (Signed)
Order provided and will be faxed over. Thank you. ?

## 2021-05-26 NOTE — Telephone Encounter (Signed)
Order faxed to [facility] to discontinue patient's Xarelto at [phone].  Call to [facility] at [phone] to inform them that prescription has been faxed. ?

## 2021-11-13 DIAGNOSIS — M79675 Pain in left toe(s): Secondary | ICD-10-CM | POA: Diagnosis not present

## 2021-11-13 DIAGNOSIS — I739 Peripheral vascular disease, unspecified: Secondary | ICD-10-CM | POA: Diagnosis not present

## 2021-11-13 DIAGNOSIS — M2041 Other hammer toe(s) (acquired), right foot: Secondary | ICD-10-CM | POA: Diagnosis not present

## 2021-11-13 DIAGNOSIS — B351 Tinea unguium: Secondary | ICD-10-CM | POA: Diagnosis not present

## 2021-12-08 ENCOUNTER — Telehealth: Payer: Self-pay | Admitting: Student in an Organized Health Care Education/Training Program

## 2021-12-08 NOTE — Telephone Encounter (Signed)
I spoke with Jordan Long today to check in on how Jordan Long is doing. She reports stable condition, low functional status. I am going to make a home visit with him later this week to reassess and adjust medications.

## 2021-12-09 ENCOUNTER — Encounter: Payer: Self-pay | Admitting: Student in an Organized Health Care Education/Training Program

## 2021-12-09 ENCOUNTER — Ambulatory Visit (INDEPENDENT_AMBULATORY_CARE_PROVIDER_SITE_OTHER): Payer: Medicare HMO | Admitting: Student in an Organized Health Care Education/Training Program

## 2021-12-09 DIAGNOSIS — G309 Alzheimer's disease, unspecified: Secondary | ICD-10-CM

## 2021-12-09 DIAGNOSIS — Z515 Encounter for palliative care: Secondary | ICD-10-CM

## 2021-12-09 DIAGNOSIS — D6859 Other primary thrombophilia: Secondary | ICD-10-CM

## 2021-12-09 DIAGNOSIS — F02C11 Dementia in other diseases classified elsewhere, severe, with agitation: Secondary | ICD-10-CM

## 2021-12-09 NOTE — Progress Notes (Signed)
Established Patient Home Visit  Subjective   Patient ID: Jordan Long., male    DOB: 15-Jun-1950  Age: 71 y.o. MRN: [id number]    HPI  This is a follow-up home visit for 71 year old person living at a locked memory care unit for about the last 5 years now at end-of-life care for progressive, advanced Alzheimer's dementia.  Every month I sign off on his medication administration record from [facility].  I spoke with his sister by phone, she tells me his condition has been stable the last few months.  Its been about 10 months since I visited with [name].  He has had no emergency department visits, no hospitalizations, no falls.  His symptoms have progressed, he no longer gets out of bed on his own, requires a Hoyer lift for all mobilization.  Some days he does not feed himself.  Mostly nonverbal at this point.  Staff at [facility] tell me that he has been calm, does not follow instructions or interact with staff.  Today has been a good day and he did feed himself this morning.  [name] did not talk to me because of his advanced dementia.  I asked if he had any pain and he said no.    Objective:      Physical Exam  General: Chronically ill-appearing man, sitting in wheelchair in no distress CV: Regular rate and rhythm with no murmurs Lungs: Clear bilaterally with no wheezing or crackles, normal work of breathing Abdomen: Soft and nondistended, nontender to palpation Extremities: Warm, compression stockings in place, 1+ pitting edema on the right leg, trace edema on the left Skin: No ecchymoses or skin breakdown Psych: Unresponsive, does not make eye contact, withdrawn appearing, does not follow instructions     Assessment & Plan:   Problem List Items Addressed This Visit       High   End of life care (Chronic)    Currently established with home hospice.  They are making visits with [name] every few weeks.  Current medications include as needed hydrocodone for pain,  liquid morphine as needed for pain or air hunger, oral Haldol 1 mg as needed for agitation, Ativan 1 mg oral as needed for anxiety.  Currently not requiring much of these medications at this time.  Hard to tell his prognosis but likely days to weeks depending on his food intake.  Though he does not have much quality of life due to the dementia, he does appear well taken care of at [facility], has daily visits with his sister which I am thankful for.      Alzheimer's dementia ([name]) - Primary (Chronic)    Very advanced Alzheimer's dementia, completely nonfunctional and deactivated at this time.  Requiring help in all activities of daily living from staff at memory care unit.  He is active with home hospice care.  Symptoms seem to be well controlled at this point.  I left orders at [facility] to discontinue risperidone.  We started that medication about 4 years ago when he first moved to the memory unit and was having issues with agitation and aggressive behavior.  At this point because of his dementia he is totally withdrawn, I do not think at risk for aggressive behavior.  Also has orders for as needed Haldol 1 mg oral and Ativan 1 mg oral for anxiety or agitation.      Relevant Medications   haloperidol (HALDOL) 1 MG tablet   Hypercoagulable state (HCC) (Chronic)  Currently stable.  [name] discontinued rivaroxaban in April due to changes in goals of care.  He has had no clinical signs of symptomatic DVT since that time.  Staff are applying compression stockings to his lower extremities daily which can continue.        Axel Filler, MD

## 2021-12-09 NOTE — Assessment & Plan Note (Signed)
Currently established with home hospice.  They are making visits with [facility] every few weeks.  Current medications include as needed hydrocodone for pain, liquid morphine as needed for pain or air hunger, oral Haldol 1 mg as needed for agitation, Ativan 1 mg oral as needed for anxiety.  Currently not requiring much of these medications at this time.  Hard to tell his prognosis but likely days to weeks depending on his food intake.  Though he does not have much quality of life due to the dementia, he does appear well taken care of [facility], has daily visits with his sister which I am thankful for.

## 2021-12-09 NOTE — Assessment & Plan Note (Signed)
Currently stable.  [place] discontinued rivaroxaban in April due to changes in goals of care.  He has had no clinical signs of symptomatic DVT since that time.  Staff are applying compression stockings to his lower extremities daily which can continue.

## 2021-12-09 NOTE — Assessment & Plan Note (Signed)
Very advanced Alzheimer's dementia, completely nonfunctional and deactivated at this time.  Requiring help in all activities of daily living from staff at memory care unit.  He is active with home hospice care.  Symptoms seem to be well controlled at this point.  I left orders at [facility] to discontinue risperidone.  We started that medication about 4 years ago when he first moved to the memory unit and was having issues with agitation and aggressive behavior.  At this point because of his dementia he is totally withdrawn, I do not think at risk for aggressive behavior.  Also has orders for as needed Haldol 1 mg oral and Ativan 1 mg oral for anxiety or agitation.

## 2022-02-02 DEATH — deceased
